# Patient Record
Sex: Male | Born: 1975 | Race: White | Hispanic: No | Marital: Single | Smoking: Former smoker
Health system: Southern US, Community
[De-identification: ages and names within clinical notes are randomized; demographics above are authoritative.]

## PROBLEM LIST (undated history)

## (undated) DIAGNOSIS — E119 Type 2 diabetes mellitus without complications: Secondary | ICD-10-CM

## (undated) DIAGNOSIS — K859 Acute pancreatitis without necrosis or infection, unspecified: Secondary | ICD-10-CM

## (undated) DIAGNOSIS — A523 Neurosyphilis, unspecified: Secondary | ICD-10-CM

## (undated) DIAGNOSIS — I2510 Atherosclerotic heart disease of native coronary artery without angina pectoris: Secondary | ICD-10-CM

## (undated) DIAGNOSIS — I219 Acute myocardial infarction, unspecified: Secondary | ICD-10-CM

## (undated) DIAGNOSIS — I10 Essential (primary) hypertension: Secondary | ICD-10-CM

## (undated) DIAGNOSIS — F32A Depression, unspecified: Secondary | ICD-10-CM

## (undated) DIAGNOSIS — Z9289 Personal history of other medical treatment: Secondary | ICD-10-CM

## (undated) DIAGNOSIS — Z720 Tobacco use: Secondary | ICD-10-CM

## (undated) DIAGNOSIS — E785 Hyperlipidemia, unspecified: Secondary | ICD-10-CM

## (undated) DIAGNOSIS — F419 Anxiety disorder, unspecified: Secondary | ICD-10-CM

## (undated) DIAGNOSIS — N529 Male erectile dysfunction, unspecified: Secondary | ICD-10-CM

## (undated) DIAGNOSIS — F329 Major depressive disorder, single episode, unspecified: Secondary | ICD-10-CM

## (undated) HISTORY — DX: Hyperlipidemia, unspecified: E78.5

## (undated) HISTORY — DX: Depression, unspecified: F32.A

## (undated) HISTORY — DX: Anxiety disorder, unspecified: F41.9

## (undated) HISTORY — DX: Male erectile dysfunction, unspecified: N52.9

## (undated) HISTORY — DX: Essential (primary) hypertension: I10

## (undated) HISTORY — DX: Major depressive disorder, single episode, unspecified: F32.9

## (undated) HISTORY — DX: Atherosclerotic heart disease of native coronary artery without angina pectoris: I25.10

---
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
## 1993-03-08 HISTORY — PX: PILONIDAL CYST EXCISION: SHX744

## 2014-04-19 ENCOUNTER — Inpatient Hospital Stay (HOSPITAL_COMMUNITY): Payer: 59

## 2014-04-19 ENCOUNTER — Inpatient Hospital Stay (HOSPITAL_COMMUNITY)
Admission: EM | Admit: 2014-04-19 | Discharge: 2014-04-21 | DRG: 057 | Disposition: A | Discharge status: Home Health | Payer: 59 | Attending: Internal Medicine | Admitting: Internal Medicine

## 2014-04-19 ENCOUNTER — Encounter (HOSPITAL_COMMUNITY): Payer: Self-pay | Admitting: *Deleted

## 2014-04-19 ENCOUNTER — Emergency Department (HOSPITAL_COMMUNITY): Payer: 59

## 2014-04-19 DIAGNOSIS — A523 Neurosyphilis, unspecified: Secondary | ICD-10-CM | POA: Diagnosis present

## 2014-04-19 DIAGNOSIS — A5149 Other secondary syphilitic conditions: Secondary | ICD-10-CM | POA: Diagnosis present

## 2014-04-19 DIAGNOSIS — F17210 Nicotine dependence, cigarettes, uncomplicated: Secondary | ICD-10-CM | POA: Diagnosis present

## 2014-04-19 DIAGNOSIS — F1290 Cannabis use, unspecified, uncomplicated: Secondary | ICD-10-CM | POA: Diagnosis present

## 2014-04-19 DIAGNOSIS — Z7251 High risk heterosexual behavior: Secondary | ICD-10-CM

## 2014-04-19 DIAGNOSIS — T508X5A Adverse effect of diagnostic agents, initial encounter: Secondary | ICD-10-CM | POA: Diagnosis present

## 2014-04-19 DIAGNOSIS — Z8619 Personal history of other infectious and parasitic diseases: Secondary | ICD-10-CM | POA: Insufficient documentation

## 2014-04-19 DIAGNOSIS — H9319 Tinnitus, unspecified ear: Secondary | ICD-10-CM | POA: Insufficient documentation

## 2014-04-19 DIAGNOSIS — Z716 Tobacco abuse counseling: Secondary | ICD-10-CM | POA: Diagnosis present

## 2014-04-19 DIAGNOSIS — H9313 Tinnitus, bilateral: Secondary | ICD-10-CM

## 2014-04-19 DIAGNOSIS — F17200 Nicotine dependence, unspecified, uncomplicated: Secondary | ICD-10-CM | POA: Insufficient documentation

## 2014-04-19 DIAGNOSIS — R21 Rash and other nonspecific skin eruption: Secondary | ICD-10-CM | POA: Diagnosis present

## 2014-04-19 HISTORY — DX: Neurosyphilis, unspecified: A52.3

## 2014-04-19 LAB — COMPREHENSIVE METABOLIC PANEL
ALK PHOS: 90 U/L (ref 39–117)
ALT: 28 U/L (ref 0–53)
AST: 32 U/L (ref 0–37)
Albumin: 3.5 g/dL (ref 3.5–5.2)
Anion gap: 10 (ref 5–15)
BUN: 8 mg/dL (ref 6–23)
CO2: 24 mmol/L (ref 19–32)
Calcium: 8.9 mg/dL (ref 8.4–10.5)
Chloride: 103 mmol/L (ref 96–112)
Creatinine, Ser: 0.62 mg/dL (ref 0.50–1.35)
GFR calc non Af Amer: >90 mL/min (ref >90)
Glucose, Bld: 112 mg/dL — ABNORMAL HIGH (ref 70–99)
POTASSIUM: 4 mmol/L (ref 3.5–5.1)
SODIUM: 137 mmol/L (ref 135–145)
Total Bilirubin: 0.6 mg/dL (ref 0.3–1.2)
Total Protein: 7.1 g/dL (ref 6.0–8.3)

## 2014-04-19 LAB — CBC WITH DIFFERENTIAL/PLATELET
BASOS PCT: 1 % (ref 0–1)
Basophils Absolute: 0.1 10*3/uL (ref 0.0–0.1)
EOS PCT: 1 % (ref 0–5)
Eosinophils Absolute: 0.1 10*3/uL (ref 0.0–0.7)
HCT: 42.9 % (ref 39.0–52.0)
Hemoglobin: 14.9 g/dL (ref 13.0–17.0)
Lymphocytes Relative: 22 % (ref 12–46)
Lymphs Abs: 2 10*3/uL (ref 0.7–4.0)
MCH: 31.3 pg (ref 26.0–34.0)
MCHC: 34.7 g/dL (ref 30.0–36.0)
MCV: 90.1 fL (ref 78.0–100.0)
MONO ABS: 0.9 10*3/uL (ref 0.1–1.0)
Monocytes Relative: 10 % (ref 3–12)
NEUTROS ABS: 6.1 10*3/uL (ref 1.7–7.7)
Neutrophils Relative %: 66 % (ref 43–77)
Platelets: 255 10*3/uL (ref 150–400)
RBC: 4.76 MIL/uL (ref 4.22–5.81)
RDW: 14.1 % (ref 11.5–15.5)
WBC: 9.1 10*3/uL (ref 4.0–10.5)

## 2014-04-19 LAB — RAPID HIV SCREEN (WH-MAU): SUDS RAPID HIV SCREEN: NONREACTIVE

## 2014-04-19 LAB — TSH: TSH: 1.548 u[IU]/mL (ref 0.350–4.500)

## 2014-04-19 MED ORDER — ACETAMINOPHEN 650 MG RE SUPP: 650.0000 mg | Freq: Four times a day (QID) | RECTAL | Status: DC | PRN

## 2014-04-19 MED ORDER — SODIUM CHLORIDE 0.9 % IJ SOLN: 3.0000 mL | Freq: Two times a day (BID) | INTRAMUSCULAR | Status: DC

## 2014-04-19 MED ORDER — SODIUM CHLORIDE 0.9 % IJ SOLN
10.0000 mL | Freq: Two times a day (BID) | INTRAMUSCULAR | Status: DC
  Administered 2014-04-19: 10 mL

## 2014-04-19 MED ORDER — SODIUM CHLORIDE 0.9 % IV SOLN
INTRAVENOUS | Status: DC
  Administered 2014-04-19 – 2014-04-20 (×3): via INTRAVENOUS

## 2014-04-19 MED ORDER — OXYCODONE HCL 5 MG PO TABS: 5.0000 mg | ORAL_TABLET | Freq: Four times a day (QID) | ORAL | Status: DC | PRN

## 2014-04-19 MED ORDER — GADOBENATE DIMEGLUMINE 529 MG/ML IV SOLN
15.0000 mL | Freq: Once | INTRAVENOUS | Status: AC | PRN
  Administered 2014-04-19: 15 mL via INTRAVENOUS

## 2014-04-19 MED ORDER — ACETAMINOPHEN 325 MG PO TABS: 650.0000 mg | ORAL_TABLET | Freq: Four times a day (QID) | ORAL | Status: DC | PRN

## 2014-04-19 MED ORDER — DOCUSATE SODIUM 100 MG PO CAPS
100.0000 mg | ORAL_CAPSULE | Freq: Two times a day (BID) | ORAL | Status: DC
  Filled 2014-04-19 (×4): qty 1

## 2014-04-19 MED ORDER — SODIUM CHLORIDE 0.9 % IJ SOLN: 10.0000 mL | INTRAMUSCULAR | Status: DC | PRN

## 2014-04-19 NOTE — H&P (Addendum)
[facility] History and Physical  [place] Current ONG:[id number] DOB: January 21, 1976 DOA: 04/19/2014  Referring physician:  PCP: No primary care provider on file.   Chief Complaint: Rash  HPI:  39 year old male sent to the ED for evaluation of tinnitus in his left ear for the past 2-3 weeks. Patient was evaluated at the health department and was found to have a diffuse maculopapular rash, he was sent to the ED for evaluation of neurosyphilis.Pt states he "usually gets STD testing every 3 months because I'm sexually active" and his most recent testing came back positive for syphilis. Patient has also noted multiple ulcerated macular lesions on his scrotum and has been an area in the last 2-3 weeks. He is unaware of or his partner is. He denies any IV drug use. He admits to using occasional marijuana, drinks alcohol 3-5 times a week. Denies any fever chills,rigors.No headache , no vertigo,  Recent HIV testing this month was negative.  Of note  is that the patient was into wrestling 20 years ago, and history of external auricular trauma, but denies any residual loss of hearing, or residual neurologic symptoms.  eview of Systems: negative for the following  Constitutional: Denies fever, chills, diaphoresis, appetite change and fatigue.  HEENT: Denies photophobia, eye pain, redness, hearing loss, ear pain, congestion, sore throat, rhinorrhea, sneezing, mouth sores, trouble swallowing, neck pain, neck stiffness and tinnitus.  Respiratory: Denies SOB, DOE, cough, chest tightness, and wheezing.  Cardiovascular: Denies chest pain, palpitations and leg swelling.  Gastrointestinal: Denies nausea, vomiting, abdominal pain, diarrhea, constipation, blood in stool and abdominal distention.  Genitourinary: Denies dysuria, urgency, frequency, hematuria, flank pain and difficulty urinating.  Musculoskeletal: Denies myalgias, back pain, joint swelling, arthralgias and gait problem.  Skin: Diffuse maculopapular  rash and wound.  Neurological: Denies dizziness, seizures, syncope, weakness, light-headedness, numbness and headaches.  Hematological: Denies adenopathy. Easy bruising, personal or family bleeding history  Psychiatric/Behavioral: Denies suicidal ideation, mood changes, confusion, nervousness, sleep disturbance and agitation       History reviewed. No pertinent past medical history.   Surgical history   history of pilonidal cyst removal    Social History:  reports that he has been smoking Cigarettes.  He does not have any smokeless tobacco history on file. He reports that he drinks alcohol.  Uses occasional marijuana. Multiple sexual partners    No Known Allergies      FAMILY HISTORY  When questioned  Directly-patient reports  No family history of HTN, CVA ,  TB, Cancer CAD, Bleeding Disorders, Sickle Cell, diabetes, anemia, asthma,  Father has diabetes, peripheral vascular disease   Prior to Admission medications   Not on File     Physical Exam: Filed Vitals:   04/19/14 1630 04/19/14 1700 04/19/14 1730 04/19/14 1800  BP: 135/82 138/93 139/88 137/88  Pulse: 93 93 85 99  Temp:      TempSrc:      Resp:      SpO2: 96% 97% 99% 96%     Constitutional: Vital s stable, Nontoxic appearing  and well-nourished in no acute distress and cooperative with exam. Alert and oriented x3.  Head: Normocephalic and atraumatic  Ear: TM normal bilaterally  Mouth: no erythema or exudates, MMM  Eyes: PERRL, EOMI, conjunctivae normal, No scleral icterus.  Neck: Supple, Trachea midline normal ROM, No JVD, mass, thyromegaly, or carotid bruit present.  Cardiovascular: RRR, S1 normal, S2 normal, no MRG, pulses symmetric and intact bilaterally  Pulmonary/Chest: CTAB, no wheezes, rales, or rhonchi  Abdominal:  Soft. Non-tender, non-distended, bowel sounds are normal, no masses, organomegaly, or guarding present.  GU: no CVA tenderness Musculoskeletal: No joint deformities, erythema, or  stiffness, ROM full and no nontender Ext: no edema and no cyanosis, pulses palpable bilaterally (DP and PT)  Hematology: no cervical, inginal, or axillary adenopathy.  Neurological: A&O x3, Strenght is normal and symmetric bilaterally, cranial nerve II-XII are grossly intact, no focal motor deficit, sensory intact to light touch bilaterally.  Skin:  diffuse maculopapular rash . No rash, cyanosis, or clubbing.  Psychiatric: Normal mood and affect. speech and behavior is normal. Judgment and thought content normal. Cognition and memory are normal.       Labs on Admission:    Basic Metabolic Panel:  Recent Labs Lab 04/19/14 1307  NA 137  K 4.0  CL 103  CO2 24  GLUCOSE 112*  BUN 8  CREATININE 0.62  CALCIUM 8.9   Liver Function Tests:  Recent Labs Lab 04/19/14 1307  AST 32  ALT 28  ALKPHOS 90  BILITOT 0.6  PROT 7.1  ALBUMIN 3.5   No results for input(s): LIPASE, AMYLASE in the last 168 hours. No results for input(s): AMMONIA in the last 168 hours. CBC:  Recent Labs Lab 04/19/14 1307  WBC 9.1  NEUTROABS 6.1  HGB 14.9  HCT 42.9  MCV 90.1  PLT 255   Cardiac Enzymes: No results for input(s): CKTOTAL, CKMB, CKMBINDEX, TROPONINI in the last 168 hours.  BNP (last 3 results) No results for input(s): BNP in the last 8760 hours.  ProBNP (last 3 results) No results for input(s): PROBNP in the last 8760 hours.     CBG: No results for input(s): GLUCAP in the last 168 hours.  Radiological Exams on Admission: Ct Head Wo Contrast  04/19/2014   CLINICAL DATA:  Tinnitus.  EXAM: CT HEAD WITHOUT CONTRAST  TECHNIQUE: Contiguous axial images were obtained from the base of the skull through the vertex without contrast.  COMPARISON:  None  FINDINGS: No evidence for acute hemorrhage, mass lesion, midline shift, hydrocephalus or large infarct. There may be a small amount of fluid in the left mastoid air cells. Mild mucosal disease in the ethmoid air cells. No acute bone  abnormality.  IMPRESSION: No acute intracranial abnormality.   Electronically Signed   By: Markus Daft M.D.   On: 04/19/2014 14:24    EKG: Independently reviewed.   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 Drucilla Schmidt recommended lumbar puncture Per Dr. Tommy Medal at [facility], pt needs LP with following: VDRL, cell count, diff, protein, glucose and FTA antibody Requested interventional radiology, EDP concerned about rash and introducing infection into the spine Patient had PICC line placement, pending antibiotic treatment According to Dr. Drucilla Schmidt, no antibiotics until a definitive diagnosis is made Therefore the patient has not been initiated on any antibiotics Infectious disease will consult Check for HIV CT of the head without contrast is negative,   Dr [place] Crane wanted patient to be bathed before LP, therefore it will be done tomorrow, he also recommends brain MRI , therefore will order tonight   Family history of diabetes Check hemoglobin A1c   Code Status:   full Family Communication: bedside Disposition Plan: admit   Time spent: 70 mins   [facility] Pager [phone]  If 7PM-7AM, please contact night-coverage [email or web address] Password [facility] 04/19/2014, 6:04 PM

## 2014-04-19 NOTE — ED Notes (Signed)
Report attempt x 1 

## 2014-04-19 NOTE — ED Notes (Signed)
See prior note, reports having syphillis and has rash to genitals but sent here for further eval due to ringing in his ears.

## 2014-04-19 NOTE — ED Provider Notes (Signed)
CSN: [id number]     Arrival date & time 04/19/14  1131 History   First MD Initiated Contact with Patient 04/19/14 1249     Chief Complaint  Patient presents with  . Tinnitus      HPI  Pt was seen at 1255.  Per pt, c/o gradual onset and persistence of constant "ringing" in his left ear for the past 2 to 3 weeks. Pt was evaluated by his MD at the [facility] and was told to come to the ED for further evaluation/admission for possible tertiary syphilis. Pt states he "usually gets STD testing every 3 months because I'm sexually active" and his most recent testing came back positive for syphilis. Denies dysuria/hematuria, no testicular pain/swelling, no abd pain, no N/V/D, no back pain, no CP/SOB, no fevers, no tingling/numbness in extremities, no focal motor weakness, no facial droop, no slurred speech.    History reviewed. No pertinent past medical history.   History reviewed. No pertinent past surgical history.  History  Substance Use Topics  . Smoking status: Current Every Day Smoker    Types: Cigarettes  . Smokeless tobacco: Not on file  . Alcohol Use: Yes    Review of Systems ROS: Statement: All systems negative except as marked or noted in the HPI; Constitutional: Negative for fever and chills. ; ; Eyes: Negative for eye pain, redness and discharge. ; ; ENMT: Negative for ear pain, hoarseness, nasal congestion, sinus pressure and sore throat. ; ; Cardiovascular: Negative for chest pain, palpitations, diaphoresis, dyspnea and peripheral edema. ; ; Respiratory: Negative for cough, wheezing and stridor. ; ; Gastrointestinal: Negative for nausea, vomiting, diarrhea, abdominal pain, blood in stool, hematemesis, jaundice and rectal bleeding. . ; ; Genitourinary: Negative for dysuria, flank pain and hematuria. ; ; Musculoskeletal: Negative for back pain and neck pain. Negative for swelling and trauma.; ; Skin: +diffuse rash. Negative for pruritus, abrasions, blisters, bruising and skin lesion.;  ; Neuro: +paresthesias. Negative for headache, lightheadedness and neck stiffness. Negative for weakness, altered level of consciousness , altered mental status, extremity weakness, involuntary movement, seizure and syncope.     Allergies  Review of patient's allergies indicates no known allergies.  Home Medications   Prior to Admission medications   Not on File   BP 122/78 mmHg  Pulse 96  Temp(Src) 98.8 F (37.1 C) (Oral)  Resp 18  SpO2 97% Physical Exam  1300; Physical examination:  Nursing notes reviewed; Vital signs and O2 SAT reviewed;  Constitutional: Well developed, Well nourished, Well hydrated, In no acute distress; Head:  Normocephalic, atraumatic; Eyes: EOMI, PERRL, No scleral icterus; ENMT: TM's clear bilat. Mouth and pharynx normal, Mucous membranes moist; Neck: Supple, Full range of motion, No lymphadenopathy; Cardiovascular: Regular rate and rhythm, No murmur, rub, or gallop; Respiratory: Breath sounds clear & equal bilaterally, No rales, rhonchi, wheezes.  Speaking full sentences with ease, Normal respiratory effort/excursion; Chest: Nontender, Movement normal; Abdomen: Soft, Nontender, Nondistended, Normal bowel sounds; Genitourinary: No CVA tenderness; Extremities: Pulses normal, No tenderness, No edema, No calf edema or asymmetry.; Neuro: AA&Ox3, Major CN grossly intact.  Speech clear. No gross focal motor or sensory deficits in extremities.; Skin: Color normal, Warm, Dry, +diffuse maculopapular rash, sparing palms/soles.    ED Course  Procedures      EKG Interpretation None      MDM  MDM Reviewed: nursing note and vitals Interpretation: labs and CT scan     Results for orders placed or performed during the hospital encounter of 04/19/14  CBC with Differential  Result Value Ref Range   WBC 9.1 4.0 - 10.5 K/uL   RBC 4.76 4.22 - 5.81 MIL/uL   Hemoglobin 14.9 13.0 - 17.0 g/dL   HCT 42.9 39.0 - 52.0 %   MCV 90.1 78.0 - 100.0 fL   MCH 31.3 26.0 - 34.0 pg    MCHC 34.7 30.0 - 36.0 g/dL   RDW 14.1 11.5 - 15.5 %   Platelets 255 150 - 400 K/uL   Neutrophils Relative % 66 43 - 77 %   Neutro Abs 6.1 1.7 - 7.7 K/uL   Lymphocytes Relative 22 12 - 46 %   Lymphs Abs 2.0 0.7 - 4.0 K/uL   Monocytes Relative 10 3 - 12 %   Monocytes Absolute 0.9 0.1 - 1.0 K/uL   Eosinophils Relative 1 0 - 5 %   Eosinophils Absolute 0.1 0.0 - 0.7 K/uL   Basophils Relative 1 0 - 1 %   Basophils Absolute 0.1 0.0 - 0.1 K/uL  Rapid HIV screen  Result Value Ref Range   SUDS Rapid HIV Screen NON REACTIVE NON REACTIVE  Comprehensive metabolic panel  Result Value Ref Range   Sodium 137 135 - 145 mmol/L   Potassium 4.0 3.5 - 5.1 mmol/L   Chloride 103 96 - 112 mmol/L   CO2 24 19 - 32 mmol/L   Glucose, Bld 112 (H) 70 - 99 mg/dL   BUN 8 6 - 23 mg/dL   Creatinine, Ser 0.62 0.50 - 1.35 mg/dL   Calcium 8.9 8.4 - 10.5 mg/dL   Total Protein 7.1 6.0 - 8.3 g/dL   Albumin 3.5 3.5 - 5.2 g/dL   AST 32 0 - 37 U/L   ALT 28 0 - 53 U/L   Alkaline Phosphatase 90 39 - 117 U/L   Total Bilirubin 0.6 0.3 - 1.2 mg/dL   GFR calc non Af Amer >90 >90 mL/min   GFR calc Af Amer >90 >90 mL/min   Anion gap 10 5 - 15   Ct Head Wo Contrast 04/19/2014   CLINICAL DATA:  Tinnitus.  EXAM: CT HEAD WITHOUT CONTRAST  TECHNIQUE: Contiguous axial images were obtained from the base of the skull through the vertex without contrast.  COMPARISON:  None  FINDINGS: No evidence for acute hemorrhage, mass lesion, midline shift, hydrocephalus or large infarct. There may be a small amount of fluid in the left mastoid air cells. Mild mucosal disease in the ethmoid air cells. No acute bone abnormality.  IMPRESSION: No acute intracranial abnormality.   Electronically Signed   By: Markus Daft M.D.   On: 04/19/2014 14:24    1515:  Workup reassuring. RPR pending. Pt has a diffuse rash which is concerning for secondary syphilis; will not pass though this potential treponemal containing rash for LP. Dx and testing d/w pt.   Questions answered.  Verb understanding, agreeable to admit. T/C to [facility] Dr. Drucilla Schmidt, case discussed, including:  HPI, pertinent PM/SHx, VS/PE, dx testing, ED course and treatment, as well as concern regarding possible secondary syphilis appearing rash that covers the site where LP would be performed, he states to hold LP and pt will have to be presumptively tx for tertiary syphilis with long tern IV abx through PICC line.   T/C to [facility] Dr. Allyson Sabal, case discussed, including:  HPI, pertinent PM/SHx, VS/PE, dx testing, ED course and treatment, as well as d/w ID MD: Agreeable to admit, requests to write temporary orders, obtain medical bed to team MCAdmits.  Francine Graven, DO 04/22/14 1223

## 2014-04-19 NOTE — ED Notes (Signed)
Pt was seen at [facility] and referred here to rule out neuro syphilis.  Per Dr. Tommy Medal at ID, pt needs LP with following: VDRL, cell count, diff, protein, glucose and FTA antibody.  If white count is elevated, pt is to be admitted for antibiotics.

## 2014-04-19 NOTE — Progress Notes (Signed)
Peripherally Inserted Central Catheter/Midline Placement  The IV Nurse has discussed with the patient and/or persons authorized to consent for the patient, the purpose of this procedure and the potential benefits and risks involved with this procedure.  The benefits include less needle sticks, lab draws from the catheter and patient may be discharged home with the catheter.  Risks include, but not limited to, infection, bleeding, blood clot (thrombus formation), and puncture of an artery; nerve damage and irregular heat beat.  Alternatives to this procedure were also discussed.  PICC/Midline Placement Documentation  PICC / Midline Single Lumen 16/60/63 PICC Right Basilic 38 cm 0 cm (Active)  Indication for Insertion or Continuance of Line Prolonged intravenous therapies 04/19/2014  5:30 PM  Exposed Catheter (cm) 0 cm 04/19/2014  5:30 PM  Site Assessment Clean;Dry;Intact 04/19/2014  5:30 PM  Line Status Flushed;Saline locked;Blood return noted 04/19/2014  5:30 PM  Dressing Type Transparent 04/19/2014  5:30 PM  Dressing Intervention New dressing 04/19/2014  5:30 PM  Dressing Change Due 04/26/14 04/19/2014  5:30 PM       [place] Payment 04/19/2014, 5:42 PM

## 2014-04-20 DIAGNOSIS — A5149 Other secondary syphilitic conditions: Secondary | ICD-10-CM

## 2014-04-20 DIAGNOSIS — A523 Neurosyphilis, unspecified: Principal | ICD-10-CM

## 2014-04-20 DIAGNOSIS — F17200 Nicotine dependence, unspecified, uncomplicated: Secondary | ICD-10-CM | POA: Insufficient documentation

## 2014-04-20 DIAGNOSIS — H9319 Tinnitus, unspecified ear: Secondary | ICD-10-CM | POA: Insufficient documentation

## 2014-04-20 DIAGNOSIS — H9313 Tinnitus, bilateral: Secondary | ICD-10-CM

## 2014-04-20 DIAGNOSIS — Z7251 High risk heterosexual behavior: Secondary | ICD-10-CM | POA: Insufficient documentation

## 2014-04-20 DIAGNOSIS — Z8619 Personal history of other infectious and parasitic diseases: Secondary | ICD-10-CM | POA: Insufficient documentation

## 2014-04-20 LAB — COMPREHENSIVE METABOLIC PANEL
ALK PHOS: 81 U/L (ref 39–117)
ALT: 25 U/L (ref 0–53)
AST: 28 U/L (ref 0–37)
Albumin: 3.1 g/dL — ABNORMAL LOW (ref 3.5–5.2)
Anion gap: 7 (ref 5–15)
BILIRUBIN TOTAL: 0.5 mg/dL (ref 0.3–1.2)
BUN: 6 mg/dL (ref 6–23)
CO2: 29 mmol/L (ref 19–32)
CREATININE: 0.6 mg/dL (ref 0.50–1.35)
Calcium: 8.5 mg/dL (ref 8.4–10.5)
Chloride: 102 mmol/L (ref 96–112)
GFR calc Af Amer: >90 mL/min (ref >90)
GFR calc non Af Amer: >90 mL/min (ref >90)
GLUCOSE: 101 mg/dL — AB (ref 70–99)
Potassium: 3.7 mmol/L (ref 3.5–5.1)
SODIUM: 138 mmol/L (ref 135–145)
Total Protein: 6.5 g/dL (ref 6.0–8.3)

## 2014-04-20 LAB — CBC
HCT: 41.5 % (ref 39.0–52.0)
Hemoglobin: 14.1 g/dL (ref 13.0–17.0)
MCH: 31.3 pg (ref 26.0–34.0)
MCHC: 34 g/dL (ref 30.0–36.0)
MCV: 92 fL (ref 78.0–100.0)
PLATELETS: 239 10*3/uL (ref 150–400)
RBC: 4.51 MIL/uL (ref 4.22–5.81)
RDW: 14.2 % (ref 11.5–15.5)
WBC: 6.9 10*3/uL (ref 4.0–10.5)

## 2014-04-20 LAB — URINALYSIS, ROUTINE W REFLEX MICROSCOPIC
Glucose, UA: NEGATIVE mg/dL
Hgb urine dipstick: NEGATIVE
Ketones, ur: NEGATIVE mg/dL
LEUKOCYTES UA: NEGATIVE
Nitrite: NEGATIVE
PROTEIN: NEGATIVE mg/dL
Specific Gravity, Urine: 1.037 — ABNORMAL HIGH (ref 1.005–1.030)
Urobilinogen, UA: 1 mg/dL (ref 0.0–1.0)
pH: 5.5 (ref 5.0–8.0)

## 2014-04-20 LAB — HEMOGLOBIN A1C
HEMOGLOBIN A1C: 5.4 % (ref 4.8–5.6)
MEAN PLASMA GLUCOSE: 108 mg/dL

## 2014-04-20 NOTE — Consult Note (Signed)
[facility] for Infectious [facility]    Date of Admission:  04/19/2014  Date of Consult:  04/20/2014  Reason for Consult: rule out Neurosyphilis Referring Physician: Dr. Lunette Stands   HPI: [name] [name] is an 39 y.o. male who has sex with other men and it was recently diagnosed with syphilis via the [place].  Patient reports ringing in his ears for several weeks. While this was going on he then developed a chancre on his penis and a diffuse rash consistent with secondary syphilis.  He was seen in the health department by Dr. Jeremy Johann who had a high degree of concern for possible neurosyphilis giving the persistent tinnitus.  She range for him to come to the emergency department for lumbar puncture.ER physicians were not comfortable doing lumbar puncture given the extent of his secondary syphilis. They're worried about injuries included became [place] through the lumbar puncture.  He is therefore admitted and plan for interventional radiology guided lumbar puncture today for cell count differential, CSF VDRL CSF protein and glucose and CSF fluorescent treponemal antibodies  He is at high risk for HIV infection though rapid test at [facility] and here were negative.  His most recent sexual intercourse was with another man who was reportedly HIV negative and who was taking Truvada for preexposure prophylaxis  The patient himself is interested in being on preexposure prophylaxis for HIV.   Past Medical History  Diagnosis Date  . Reaction to contrast media 04/19/2014    vomited with Gadolinum Multihance   . 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 Pilonidal cyst excision  1995  ergies:   No Known Allergies   Medications: I have reviewed patients current medications as documented in Epic Anti-infectives    None      Social History:  reports that he has been smoking Cigarettes.  He has a 23  pack-year smoking history. He has never used smokeless tobacco. He reports that he drinks about 14.4 oz of alcohol per week. He reports that he does not use illicit drugs.  History reviewed. No pertinent family history.  As in HPI and primary teams notes otherwise 12 point review of systems is negative  Blood pressure 140/83, pulse 91, temperature 98.7 F (37.1 C), temperature source Oral, resp. rate 18, SpO2 97 %. General: Alert and awake, oriented x3, not in any acute distress. HEENT: anicteric sclera, pupils reactive to light and accommodation, EOMI, oropharynx clear and without exudate CVS regular rate, normal r,  no murmur rubs or gallops Chest: clear to auscultation bilaterally, no wheezing, rales or rhonchi Abdomen: soft nontender, nondistended, normal bowel sounds, Extremities: no  clubbing or edema noted bilaterally Skin: diffuse macular rash prominent on the back  GU he has a chancre on shaft of penis just near the corona Neuro: nonfocal, strength and sensation intact   Results for orders placed or performed during the hospital encounter of 04/19/14 (from the past 48 hour(s))  CBC with Differential     Status: None   Collection Time: 04/19/14  1:07 PM  Result Value Ref Range   WBC 9.1 4.0 - 10.5 K/uL   RBC 4.76 4.22 - 5.81 MIL/uL   Hemoglobin 14.9 13.0 - 17.0 g/dL   HCT 42.9 39.0 - 52.0 %   MCV 90.1 78.0 - 100.0 fL   MCH 31.3 26.0 - 34.0 pg   MCHC 34.7 30.0 - 36.0 g/dL  RDW 14.1 11.5 - 15.5 %   Platelets 255 150 - 400 K/uL   Neutrophils Relative % 66 43 - 77 %   Neutro Abs 6.1 1.7 - 7.7 K/uL   Lymphocytes Relative 22 12 - 46 %   Lymphs Abs 2.0 0.7 - 4.0 K/uL   Monocytes Relative 10 3 - 12 %   Monocytes Absolute 0.9 0.1 - 1.0 K/uL   Eosinophils Relative 1 0 - 5 %   Eosinophils Absolute 0.1 0.0 - 0.7 K/uL   Basophils Relative 1 0 - 1 %   Basophils Absolute 0.1 0.0 - 0.1 K/uL  Rapid HIV screen     Status: None   Collection Time: 04/19/14  1:07 PM  Result Value Ref  Range   SUDS Rapid HIV Screen NON REACTIVE NON REACTIVE  Comprehensive metabolic panel     Status: Abnormal   Collection Time: 04/19/14  1:07 PM  Result Value Ref Range   Sodium 137 135 - 145 mmol/L   Potassium 4.0 3.5 - 5.1 mmol/L   Chloride 103 96 - 112 mmol/L   CO2 24 19 - 32 mmol/L   Glucose, Bld 112 (H) 70 - 99 mg/dL   BUN 8 6 - 23 mg/dL   Creatinine, Ser 0.62 0.50 - 1.35 mg/dL   Calcium 8.9 8.4 - 10.5 mg/dL   Total Protein 7.1 6.0 - 8.3 g/dL   Albumin 3.5 3.5 - 5.2 g/dL   AST 32 0 - 37 U/L   ALT 28 0 - 53 U/L   Alkaline Phosphatase 90 39 - 117 U/L   Total Bilirubin 0.6 0.3 - 1.2 mg/dL   GFR calc non Af Amer >90 >90 mL/min   GFR calc Af Amer >90 >90 mL/min    Comment: (NOTE) The eGFR has been calculated using the CKD EPI equation. This calculation has not been validated in all clinical situations. eGFR's persistently <90 mL/min signify possible Chronic Kidney Disease.    Anion gap 10 5 - 15  TSH     Status: None   Collection Time: 04/19/14  4:23 PM  Result Value Ref Range   TSH 1.548 0.350 - 4.500 uIU/mL  Hemoglobin A1c     Status: None   Collection Time: 04/19/14  4:23 PM  Result Value Ref Range   Hgb A1c MFr Bld 5.4 4.8 - 5.6 %    Comment: (NOTE)         Pre-diabetes: 5.7 - 6.4         Diabetes: >6.4         Glycemic control for adults with diabetes: <7.0    Mean Plasma Glucose 108 mg/dL    Comment: (NOTE) Performed At: [facility] [place], [place] [id number] Lindon Romp MD FW:[id number]   Comprehensive metabolic panel     Status: Abnormal   Collection Time: 04/20/14  4:39 AM  Result Value Ref Range   Sodium 138 135 - 145 mmol/L   Potassium 3.7 3.5 - 5.1 mmol/L   Chloride 102 96 - 112 mmol/L   CO2 29 19 - 32 mmol/L   Glucose, Bld 101 (H) 70 - 99 mg/dL   BUN 6 6 - 23 mg/dL   Creatinine, Ser 0.60 0.50 - 1.35 mg/dL   Calcium 8.5 8.4 - 10.5 mg/dL   Total Protein 6.5 6.0 - 8.3 g/dL   Albumin 3.1 (L) 3.5 - 5.2 g/dL   AST 28 0 -  37 U/L   ALT 25 0 - 53  U/L   Alkaline Phosphatase 81 39 - 117 U/L   Total Bilirubin 0.5 0.3 - 1.2 mg/dL   GFR calc non Af Amer >90 >90 mL/min   GFR calc Af Amer >90 >90 mL/min    Comment: (NOTE) The eGFR has been calculated using the CKD EPI equation. This calculation has not been validated in all clinical situations. eGFR's persistently <90 mL/min signify possible Chronic Kidney Disease.    Anion gap 7 5 - 15  CBC     Status: None   Collection Time: 04/20/14  4:39 AM  Result Value Ref Range   WBC 6.9 4.0 - 10.5 K/uL   RBC 4.51 4.22 - 5.81 MIL/uL   Hemoglobin 14.1 13.0 - 17.0 g/dL   HCT 41.5 39.0 - 52.0 %   MCV 92.0 78.0 - 100.0 fL   MCH 31.3 26.0 - 34.0 pg   MCHC 34.0 30.0 - 36.0 g/dL   RDW 14.2 11.5 - 15.5 %   Platelets 239 150 - 400 K/uL  Urinalysis, Routine w reflex microscopic     Status: Abnormal   Collection Time: 04/20/14  6:05 AM  Result Value Ref Range   Color, Urine AMBER (A) YELLOW    Comment: BIOCHEMICALS MAY BE AFFECTED BY COLOR   APPearance CLEAR CLEAR   Specific Gravity, Urine 1.037 (H) 1.005 - 1.030   pH 5.5 5.0 - 8.0   Glucose, UA NEGATIVE NEGATIVE mg/dL   Hgb urine dipstick NEGATIVE NEGATIVE   Bilirubin Urine SMALL (A) NEGATIVE   Ketones, ur NEGATIVE NEGATIVE mg/dL   Protein, ur NEGATIVE NEGATIVE mg/dL   Urobilinogen, UA 1.0 0.0 - 1.0 mg/dL   Nitrite NEGATIVE NEGATIVE   Leukocytes, UA NEGATIVE NEGATIVE    Comment: MICROSCOPIC NOT DONE ON URINES WITH NEGATIVE PROTEIN, BLOOD, LEUKOCYTES, NITRITE, OR GLUCOSE <1000 mg/dL.   _0 (sdes,specrequest,cult,reptstatus)   )No results found for this or any previous visit (from the past 720 hour(s)).   Impression/Recommendation  Active Problems:   Neurosyphilis   Leonell Lobdell is a 39 y.o. male with secondary syphilis RPR of 132 and tinnitus concerning for neurosyphilis  #1 Rule out neurosyphilis:  Agree with fluoroscopy guided lumbar puncture for CSF VDRL CSF cell count and differential CSF  protein and glucose and CSF FTA-Abs  The patient himself is anxious to get back to work as soon as possible.  LS the cell count is obviously high on lumbar puncture today we may not yet have all the information we need to make a diagnosis of neurosyphilis yet.  I will see what labs we have back in the morning and make a plan from there.  If we do go forward with treating him for neurosyphilis and he has a PICC line in place already the patient would prefer to be on once daily ceftriaxone at 2 g in sedative continuous penicillin  #2 high risk management having sex with other men:  I would like to prescribe the patient Truvada and he can even leave the hospital with a prescription, though I worry will have a high co-pay without the [place] co-pay assistance card which we have in our clinic.  Prior to starting Truvada and I would like to get another HIV test namely an HIV RNA and an HIV fourth generation assay as well as testing for hepatitis B and a  I spent greater than 60 minutes with the patient including greater than 50% of time in face to face counsel of the patient and in coordination of their care.  04/20/2014, 3:14 PM   Thank you so much for this interesting consult  [place] [place] [phone] (pager) [phone] (office) 04/20/2014, 3:14 PM  [place] 04/20/2014, 3:14 PM

## 2014-04-20 NOTE — Progress Notes (Signed)
[facility] PROGRESS NOTE  [name] [name] ELF:[id number] DOB: June 05, 1975 DOA: 04/19/2014 PCP: Wyatt Haste, MD 39 year old male sent to the ED for evaluation of tinnitus in his left ear for the past 2-3 weeks. Patient was evaluated at the health department and was found to have a diffuse maculopapular rash and r/o neurosyphilis.Pt states he "usually gets STD testing every 3 months  and his most recent testing came back positive for syphilis. Patient has also noted multiple ulcerated macular lesions on his scrotum and has been an area in the last 2-3 weeks. He is unaware of or his partner is. He denies any IV drug use. He admits to using occasional marijuana, drinks alcohol 3-5 times a week. Denies any fever chills,rigors.No headache , no vertigo, Recent HIV testing this month was negative.Homosexual.  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 Drucilla Schmidt ID recommended lumbar puncture by IR F/u CSF VDRL, cell count, diff, protein, glucose and FTA antibody EDP concerned about rash and introducing infection into the spine Patient had PICC line placement, ID rec IV rocephin 2 gr daily after LP HIV- Neg screening. ID rec truvada for px as pt is at high risk. CT head without contrast and MRI neg.   Tobacco use  councelled  Family history of diabetes  hemoglobin A1c- 5.6  Code Status: Full Family Communication:  Patient. Disposition Plan: home   Consultants:  ID  Procedures:  LP  Ct head  MRI brain    Objective: Filed Vitals:   04/20/14 0607  BP: 140/83  Pulse: 91  Temp: 98.7 F (37.1 C)  Resp: 18    Intake/Output Summary (Last 24 hours) at 04/20/14 1643 Last data filed at 04/20/14 1419  Gross per 24 hour  Intake   1350 ml  Output    325 ml  Net   1025 ml   There were no vitals filed for this visit.  Exam:   General:  No distress  Cardiovascular: S1, S2   Respiratory: B/l clear  Abdomen: Soft NT, ND, no  hepatospleenomegaly.  Musculoskeletal: Good ROM  Skin:  maculopapular rash diffuse   Data Reviewed: Basic Metabolic Panel:  Recent Labs Lab 04/19/14 1307 04/20/14 0439  NA 137 138  K 4.0 3.7  CL 103 102  CO2 24 29  GLUCOSE 112* 101*  BUN 8 6  CREATININE 0.62 0.60  CALCIUM 8.9 8.5   Liver Function Tests:  Recent Labs Lab 04/19/14 1307 04/20/14 0439  AST 32 28  ALT 28 25  ALKPHOS 90 81  BILITOT 0.6 0.5  PROT 7.1 6.5  ALBUMIN 3.5 3.1*   No results for input(s): LIPASE, AMYLASE in the last 168 hours. No results for input(s): AMMONIA in the last 168 hours. CBC:  Recent Labs Lab 04/19/14 1307 04/20/14 0439  WBC 9.1 6.9  NEUTROABS 6.1  --   HGB 14.9 14.1  HCT 42.9 41.5  MCV 90.1 92.0  PLT 255 239   Cardiac Enzymes: No results for input(s): CKTOTAL, CKMB, CKMBINDEX, TROPONINI in the last 168 hours. BNP (last 3 results) No results for input(s): BNP in the last 8760 hours.  ProBNP (last 3 results) No results for input(s): PROBNP in the last 8760 hours.  CBG: No results for input(s): GLUCAP in the last 168 hours.  No results found for this or any previous visit (from the past 240 hour(s)).   Studies: Ct Head Wo Contrast  04/19/2014   CLINICAL DATA:  Tinnitus.  EXAM: CT HEAD WITHOUT CONTRAST  TECHNIQUE:  Contiguous axial images were obtained from the base of the skull through the vertex without contrast.  COMPARISON:  None  FINDINGS: No evidence for acute hemorrhage, mass lesion, midline shift, hydrocephalus or large infarct. There may be a small amount of fluid in the left mastoid air cells. Mild mucosal disease in the ethmoid air cells. No acute bone abnormality.  IMPRESSION: No acute intracranial abnormality.   Electronically Signed   By: Markus Daft M.D.   On: 04/19/2014 14:24   Mr Jeri Cos LK Contrast  04/19/2014   CLINICAL DATA:  Tinnitus LEFT ear for 2-3 weeks, history of syphilis and genital rash, assess for neurosyphilis.  EXAM: MRI HEAD WITHOUT AND WITH  CONTRAST  TECHNIQUE: Multiplanar, multiecho pulse sequences of the brain and surrounding structures were obtained without and with intravenous contrast.  CONTRAST:  44m MULTIHANCE GADOBENATE DIMEGLUMINE 529 MG/ML IV SOLN  COMPARISON:  CT of the head April 19, 2014 at 1339 hours  FINDINGS: The ventricles and sulci are normal for patient's age. No abnormal parenchymal signal, mass lesions, mass effect. No abnormal parenchymal enhancement. No reduced diffusion to suggest acute ischemia. No susceptibility artifact to suggest hemorrhage.  No abnormal extra-axial fluid collections. No extra-axial masses nor leptomeningeal enhancement. Normal major intracranial vascular flow voids seen at the skull base.  Ocular globes and orbital contents are unremarkable though not tailored for evaluation. No abnormal sellar expansion. The paranasal sinuses demonstrate mild mucosal thickening without air-fluid levels. Trace LEFT mastoid effusion. No suspicious calvarial bone marrow signal. No abnormal sellar expansion. Craniocervical junction maintained.  IMPRESSION: Normal MRI of the brain with and without contrast.  Trace LEFT mastoid effusion.   Electronically Signed   By: CElon Alas  On: 04/19/2014 22:27    Scheduled Meds: . docusate sodium  100 mg Oral BID  . sodium chloride  10-40 mL Intracatheter Q12H  . sodium chloride  3 mL Intravenous Q12H   Continuous Infusions: . sodium chloride 75 mL/hr at 04/20/14 09179   Active Problems:   Neurosyphilis   Tinnitus   Secondary syphilis   High risk sexual behavior    Time spent: 329m    [name] [name]  [facility] Pager [phone] 7PM-7AM, please contact night-coverage at [email or web address], password [email or web address]/13/2016, 4:43 PM  LOS: 1 day

## 2014-04-21 DIAGNOSIS — Z720 Tobacco use: Secondary | ICD-10-CM

## 2014-04-21 LAB — PROTIME-INR
INR: 1.06 (ref 0.00–1.49)
Prothrombin Time: 13.9 seconds (ref 11.6–15.2)

## 2014-04-21 LAB — HEPATITIS A ANTIBODY, TOTAL: Hep A Total Ab: REACTIVE — AB

## 2014-04-21 LAB — HEPATITIS B SURFACE ANTIGEN: Hepatitis B Surface Ag: NEGATIVE

## 2014-04-21 LAB — HEPATITIS B SURFACE ANTIBODY, QUANTITATIVE: Hepatitis B-Post: 32.5 m[IU]/mL (ref >9.9)

## 2014-04-21 MED ORDER — CEFTRIAXONE SODIUM IN DEXTROSE 40 MG/ML IV SOLN
2.0000 g | Freq: Once | INTRAVENOUS | Status: AC
Start: 2014-04-21 — End: 2014-04-21
  Administered 2014-04-21: 2 g via INTRAVENOUS
  Filled 2014-04-21: qty 50

## 2014-04-21 MED ORDER — HEPARIN SOD (PORK) LOCK FLUSH 100 UNIT/ML IV SOLN
250.0000 [IU] | INTRAVENOUS | Status: AC | PRN
Start: 2014-04-21 — End: 2014-04-21
  Administered 2014-04-21: 250 [IU]

## 2014-04-21 MED ORDER — EMTRICITABINE-TENOFOVIR DF 200-300 MG PO TABS
1.0000 | ORAL_TABLET | Freq: Every day | ORAL | Status: DC
  Administered 2014-04-21: 1 via ORAL
  Filled 2014-04-21 (×3): qty 1

## 2014-04-21 MED ORDER — CEFTRIAXONE SODIUM 1 G IJ SOLR: 2.0000 g | INTRAMUSCULAR | Status: DC

## 2014-04-21 MED ORDER — EMTRICITABINE-TENOFOVIR DF 200-300 MG PO TABS: 1.0000 | ORAL_TABLET | Freq: Every day | ORAL | Status: DC

## 2014-04-21 NOTE — Progress Notes (Signed)
Patient had two separate episodes of vtach.  Had 8 beat run and 11 beat run within minutes of each episode.  Asymptomatic.  Notified Lynch PA.  No new orders received.  Will continue to monitor.

## 2014-04-21 NOTE — Care Management Note (Signed)
Page 1 of 2   04/21/2014     5:03:26 PM CARE MANAGEMENT NOTE 04/21/2014  Patient:  [name] [name]   Account Number:  [id number]  Date Initiated:  04/21/2014  Documentation initiated by:  JEFFRIES,SARAH  Subjective/Objective Assessment:   adm: neurosyph     Action/Plan:   discharge planning   Anticipated DC Date:  04/21/2014   Anticipated DC Plan:  HOME W HOME HEALTH SERVICES      DC Planning Services  CM consult      PAC Choice  HOME HEALTH   Choice offered to / List presented to:  C-1 Patient   DME arranged  IV PUMP/EQUIPMENT      DME agency  [facility]     HH arranged  HH-1 RN  IV Antibiotics      HH agency  [facility]   Status of service:  Completed, signed off Medicare Important Message given?   (If response is "NO", the following Medicare IM given date fields will be blank) Date Medicare IM given:   Medicare IM given by:   Date Additional Medicare IM given:   Additional Medicare IM given by:    Discharge Disposition:  HOME W HOME HEALTH SERVICES  Per UR Regulation:    If discussed at Long Length of Stay Meetings, dates discussed:    Comments:  04/21/14 16:55 Cm received call from MD stating pt to be discharged with IV ABX. CM met with pt in room to offer choice of home health agency.  Pt chooses [facility] to render HHRN for IV ABX. Address and contact information verified by pt.  Referral called to [facility] rep, Katie.  Prescription faxed to [facility] with SOC to be 15:00 04/22/14.  No other CM needs were communicated. Sarah Jeffries, BSN, CM [phone].   

## 2014-04-21 NOTE — Progress Notes (Signed)
    [facility] for Infectious Disease    I have received call from  Primary team and Neurology  I had also discussed this case with Jeremy Johann MD from [facility].  Dr. Santo Held had felt it should be safe to proceed with LP even it it meant going through lesion of secondary syphilis.  I would feel comfortable if someone did LP and went through syphilitic lesion provided the person doing the procedure washed back off extensively with hibiclens antiseptic --which they would need to do regardlessly to try to prevent introduction of nosocomial infection.

## 2014-04-21 NOTE — Discharge Summary (Signed)
Physician Discharge Summary  E[place] On EZM:[id number] DOB: 05-Nov-1975 DOA: 04/19/2014  PCP: Wyatt Haste, MD  Admit date: 04/19/2014 Discharge date: 04/21/2014  Time spent: 35 minutes  Recommendations for Outpatient Follow-up:  1. Come to short stay for Iv Rocephin on 04/22/2014  2.  F/u with PCP and Dr. Tommy Medal.  Discharge Diagnoses:  Active Problems:   Neurosyphilis   Tinnitus   Secondary syphilis   High risk sexual behavior   Tobacco use disorder   Discharge Condition: Stable  Diet recommendation: Regular  Filed Weights   04/20/14 2107  Weight: 69.446 kg (153 lb 1.6 oz)    History of present illness:  39 year old male sent to the ED for evaluation of tinnitus in his left ear for the past 2-3 weeks. Patient was evaluated at the health department and was found to have a diffuse maculopapular rash and r/o neurosyphilis.Pt states he "usually gets STD testing every 3 months and his most recent testing came back positive for syphilis. Patient has also noted multiple ulcerated macular lesions on his scrotum and has been an area in the last 2-3 weeks. He is unaware of or his partner is. He denies any IV drug use. He admits to using occasional marijuana, drinks alcohol 3-5 times a week. Denies any fever chills,rigors.No headache , no vertigo, Recent HIV testing this month was negative.Homosexual.  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 Drucilla Schmidt ID recommended lumbar puncture. Patient refused when neurohospitalist was ready to do. Started empirically on Rocephin 2gr IV and ID recommended for 2 weeks of Rocephin 2 gr IV. PICC is placed. homehealth is arranged. Due to inclement weather pt will need to come to short stay for 1 dose as outpatient. HIV- Neg screening. ID rec truvada for px as pt is at high risk sexual behavior.CT head without contrast and MRI neg.   Tobacco use councelled  Family history of diabetes hemoglobin A1c-  5.6  Consultations:  ID  Discharge Exam: Filed Vitals:   04/21/14 0517  BP: 146/86  Pulse: 95  Temp: 99.2 F (37.3 C)  Resp: 18    Discharge Instructions    Current Discharge Medication List    START taking these medications   Details  cefTRIAXone (ROCEPHIN) 1 G injection Inject 2 g into the muscle daily. Qty: 1 each, Refills: 0    emtricitabine-tenofovir (TRUVADA) 200-300 MG per tablet Take 1 tablet by mouth daily. Qty: 30 tablet, Refills: 2       No Known Allergies Follow-up Information    Schedule an appointment as soon as possible for a visit in 1 week to follow up.      Follow up with Wyatt Haste, MD.   Specialty:  Family Medicine   Contact information:   [address] [place] [address] [phone]       Follow up with Alcide Evener, MD. Schedule an appointment as soon as possible for a visit in 1 week.   Specialty:  Infectious Diseases   Contact information:   [address] [place] [address] [phone]        The results of significant diagnostics from this hospitalization (including imaging, microbiology, ancillary and laboratory) are listed below for reference.    Significant Diagnostic Studies: Ct Head Wo Contrast  04/19/2014   CLINICAL DATA:  Tinnitus.  EXAM: CT HEAD WITHOUT CONTRAST  TECHNIQUE: Contiguous axial images were obtained from the base of the skull through the vertex without contrast.  COMPARISON:  None  FINDINGS: No evidence for acute hemorrhage, mass lesion, midline shift, hydrocephalus or large infarct. There may be a small amount of fluid in the left mastoid air cells. Mild mucosal disease in the ethmoid air cells. No acute bone abnormality.  IMPRESSION: No acute intracranial abnormality.   Electronically Signed   By: Markus Daft M.D.   On: 04/19/2014 14:24   Mr Jeri Cos YO Contrast  04/19/2014   CLINICAL DATA:  Tinnitus LEFT ear for 2-3 weeks, history of syphilis and  genital rash, assess for neurosyphilis.  EXAM: MRI HEAD WITHOUT AND WITH CONTRAST  TECHNIQUE: Multiplanar, multiecho pulse sequences of the brain and surrounding structures were obtained without and with intravenous contrast.  CONTRAST:  68m MULTIHANCE GADOBENATE DIMEGLUMINE 529 MG/ML IV SOLN  COMPARISON:  CT of the head April 19, 2014 at 1339 hours  FINDINGS: The ventricles and sulci are normal for patient's age. No abnormal parenchymal signal, mass lesions, mass effect. No abnormal parenchymal enhancement. No reduced diffusion to suggest acute ischemia. No susceptibility artifact to suggest hemorrhage.  No abnormal extra-axial fluid collections. No extra-axial masses nor leptomeningeal enhancement. Normal major intracranial vascular flow voids seen at the skull base.  Ocular globes and orbital contents are unremarkable though not tailored for evaluation. No abnormal sellar expansion. The paranasal sinuses demonstrate mild mucosal thickening without air-fluid levels. Trace LEFT mastoid effusion. No suspicious calvarial bone marrow signal. No abnormal sellar expansion. Craniocervical junction maintained.  IMPRESSION: Normal MRI of the brain with and without contrast.  Trace LEFT mastoid effusion.   Electronically Signed   By: CElon Alas  On: 04/19/2014 22:27    Microbiology: No results found for this or any previous visit (from the past 240 hour(s)).   Labs: Basic Metabolic Panel:  Recent Labs Lab 04/19/14 1307 04/20/14 0439  NA 137 138  K 4.0 3.7  CL 103 102  CO2 24 29  GLUCOSE 112* 101*  BUN 8 6  CREATININE 0.62 0.60  CALCIUM 8.9 8.5   Liver Function Tests:  Recent Labs Lab 04/19/14 1307 04/20/14 0439  AST 32 28  ALT 28 25  ALKPHOS 90 81  BILITOT 0.6 0.5  PROT 7.1 6.5  ALBUMIN 3.5 3.1*   No results for input(s): LIPASE, AMYLASE in the last 168 hours. No results for input(s): AMMONIA in the last 168 hours. CBC:  Recent Labs Lab 04/19/14 1307 04/20/14 0439  WBC  9.1 6.9  NEUTROABS 6.1  --   HGB 14.9 14.1  HCT 42.9 41.5  MCV 90.1 92.0  PLT 255 239   Cardiac Enzymes: No results for input(s): CKTOTAL, CKMB, CKMBINDEX, TROPONINI in the last 168 hours. BNP: BNP (last 3 results) No results for input(s): BNP in the last 8760 hours.  ProBNP (last 3 results) No results for input(s): PROBNP in the last 8760 hours.  CBG: No results for input(s): GLUCAP in the last 168 hours.     Signed:  VVZCHYIFOY,DXAJOIN [facility] 04/21/2014, 4:12 PM

## 2014-04-21 NOTE — Progress Notes (Signed)
Pt discharged home per MD. PICC line capped and flushed by IV team. [facility] f[facility]u with Rocephin IV. Prescriptions given to pt. Pt ambulated without difficulty requiring no escort at discharge. Manya Silvas, RN

## 2014-04-21 NOTE — Progress Notes (Signed)
  Outpatient Parenteral Antimicrobial Therapy (OPAT) Pharmacy Monitoring  Guidelines developed by the Infectious Diseases Society of [place] (IDSA) recommend that hospitals who send patients home on intravenous (IV) antibiotics have an active performance improvement program.  IDSA guidelines also recommend that an infection specialist, such as a pharmacist, be involved in the evaluation of candidates for discharge with OPAT.      Yes No Comments  Assessed patient for appropriateness of OPAT? [x]  []  Is IV antimicrobial therapy needed? Is the home environment safe and adequate to support care? Are the patient/caregivers willing to participate and able to safely, effectively, and reliably deliver parenteral antimicrobial therapy? Is communication about problems and monitoring of therapy in place with patient? Does the patient/caregiver understand the benefits/risks with OPAT?  Are oral antibiotics an option? []  [x]  Patients diagnosed with endocarditis are not candidates for oral antibiotics.  Is antibiotic selected the best option for treatment indication? [x]  []  Can therapy be de-escalated further?  Is the dose appropriate? [x]  []  Consider renal function and administration schedule for outpatient setting.  Is the length of therapy appropriate? [x]  []  Consider disease-specific guidelines when available.  Recommend changing antibiotic regimen? []  [x]  If yes, recommend _________.    Final antibiotic regimen for discharge is ceftriaxone 2 gms IV q24h for 2 weeks (include drug, dose, route, frequency and duration).  Laboratory monitoring and follow-up for discharge with OPAT: BMET/CBC weekly, return to [facility] in 2-3 weeks.   Thank you for allowing pharmacy to be a part of this patient's care team.  Cassie L. Nicole Kindred, PharmD Clinical Pharmacy Resident Pager: [phone] 04/21/2014 5:14 PM

## 2014-04-21 NOTE — Progress Notes (Signed)
UR Completed.  [phone]  

## 2014-04-21 NOTE — Progress Notes (Addendum)
[facility] Infectious [facility]    Subjective:   He was understandably unhappy about all of the back and forth and delay in LP, he decided to just go with 2 weeks of IV rocephin to rx for Neurosyphilis   Antibiotics:  Anti-infectives    Start     Dose/Rate Route Frequency Ordered Stop   04/21/14 1300  emtricitabine-tenofovir (TRUVADA) 200-300 MG per tablet 1 tablet     1 tablet Oral Daily 04/21/14 1235     04/21/14 1045  cefTRIAXone (ROCEPHIN) 2 g in dextrose 5 % 50 mL IVPB - Premix     2 g 100 mL/hr over 30 Minutes Intravenous  Once 04/21/14 1044 04/21/14 1519   04/21/14 0000  emtricitabine-tenofovir (TRUVADA) 200-300 MG per tablet     1 tablet Oral Daily 04/21/14 1602     04/21/14 0000  cefTRIAXone (ROCEPHIN) 1 G injection     2 g Intramuscular Every 24 hours 04/21/14 1602 05/05/14 2359      Medications: Scheduled Meds: . docusate sodium  100 mg Oral BID  . emtricitabine-tenofovir  1 tablet Oral Daily  . sodium chloride  10-40 mL Intracatheter Q12H  . sodium chloride  3 mL Intravenous Q12H   Continuous Infusions: . sodium chloride 75 mL/hr at 04/20/14 2232   PRN Meds:.acetaminophen **OR** acetaminophen, oxyCODONE, sodium chloride    Objective: Weight change:   Intake/Output Summary (Last 24 hours) at 04/21/14 1706 Last data filed at 04/21/14 1449  Gross per 24 hour  Intake    650 ml  Output    400 ml  Net    250 ml   Blood pressure 146/86, pulse 95, temperature 99.2 F (37.3 C), temperature source Oral, resp. rate 18, weight 153 lb 1.6 oz (69.446 kg), SpO2 98 %. Temp:  [98.6 F (37 C)-99.2 F (37.3 C)] 99.2 F (37.3 C) (02/14 0517) Pulse Rate:  [87-95] 95 (02/14 0517) Resp:  [17-18] 18 (02/14 0517) BP: (121-146)/(76-86) 146/86 mmHg (02/14 0517) SpO2:  [96 %-98 %] 98 % (02/14 0517) Weight:  [153 lb 1.6 oz (69.446 kg)] 153 lb 1.6 oz (69.446 kg) (02/13 2107)  Physical Exam: General: Alert and awake, oriented x3, not in any acute distress. HEENT:  anicteric sclera, pupils reactive to light and accommodation, EOMI, oropharynx clear and without exudate CVS regular rate, normal r, no murmur rubs or gallops Chest: clear to auscultation bilaterally, no wheezing, rales or rhonchi Abdomen: soft nontender, nondistended, normal bowel sounds, Extremities: no clubbing or edema noted bilaterally Skin: diffuse macular rash prominent on the back  CBC:  CBC Latest Ref Rng 04/20/2014 04/19/2014  WBC 4.0 - 10.5 K/uL 6.9 9.1  Hemoglobin 13.0 - 17.0 g/dL 14.1 14.9  Hematocrit 39.0 - 52.0 % 41.5 42.9  Platelets 150 - 400 K/uL 239 255       BMET  Recent Labs  04/19/14 1307 04/20/14 0439  NA 137 138  K 4.0 3.7  CL 103 102  CO2 24 29  GLUCOSE 112* 101*  BUN 8 6  CREATININE 0.62 0.60  CALCIUM 8.9 8.5     Liver Panel   Recent Labs  04/19/14 1307 04/20/14 0439  PROT 7.1 6.5  ALBUMIN 3.5 3.1*  AST 32 28  ALT 28 25  ALKPHOS 90 81  BILITOT 0.6 0.5       Sedimentation Rate No results for input(s): ESRSEDRATE in the last 72 hours. C-Reactive Protein No results for input(s): CRP in the last 72 hours.  Micro Results: No  results found for this or any previous visit (from the past 720 hour(s)).  Studies/Results: Mr Jeri Cos Wo Contrast  04/19/2014   CLINICAL DATA:  Tinnitus LEFT ear for 2-3 weeks, history of syphilis and genital rash, assess for neurosyphilis.  EXAM: MRI HEAD WITHOUT AND WITH CONTRAST  TECHNIQUE: Multiplanar, multiecho pulse sequences of the brain and surrounding structures were obtained without and with intravenous contrast.  CONTRAST:  40m MULTIHANCE GADOBENATE DIMEGLUMINE 529 MG/ML IV SOLN  COMPARISON:  CT of the head April 19, 2014 at 1339 hours  FINDINGS: The ventricles and sulci are normal for patient's age. No abnormal parenchymal signal, mass lesions, mass effect. No abnormal parenchymal enhancement. No reduced diffusion to suggest acute ischemia. No susceptibility artifact to suggest hemorrhage.  No  abnormal extra-axial fluid collections. No extra-axial masses nor leptomeningeal enhancement. Normal major intracranial vascular flow voids seen at the skull base.  Ocular globes and orbital contents are unremarkable though not tailored for evaluation. No abnormal sellar expansion. The paranasal sinuses demonstrate mild mucosal thickening without air-fluid levels. Trace LEFT mastoid effusion. No suspicious calvarial bone marrow signal. No abnormal sellar expansion. Craniocervical junction maintained.  IMPRESSION: Normal MRI of the brain with and without contrast.  Trace LEFT mastoid effusion.   Electronically Signed   By: CElon Alas  On: 04/19/2014 22:27      Assessment/Plan:  Active Problems:   Neurosyphilis   Tinnitus   Secondary syphilis   High risk sexual behavior   Tobacco use disorder    DDidier Brandenburgis a 39y.o. male  with secondary syphilis RPR of 132 and tinnitus concerning for neurosyphilis  #1 Rule out neurosyphilis:  Extensive discussion with patient with [facility], Neurology (who graciously offfered to perform LP after Radiology refused --had initially been told they would do LP after request by Dr. MThurnell Garbe  We have decided to go with IV rocephin 2 grams daily x 2 weeks for empiric rx for Neurosyphillis  #2 high risk management having sex with other men:  --start truvada and can send him with script for #30 (he may not be able to afford without [facility] copay card +/- [employer] but he can find out what copay is  IF somehow his  HIV RNA is + I will change him to a full regimen  I will plan on seeing him in my clinic in next 2-3 weeks  He can certainly come and get [facility] card sooner that than for his PREP  I spent greater than 40 minutes with the patient including greater than 50% of time in face to face counsel of the patient and in coordination of their care.    LOS: 2 days   CAlcide Evener2/14/2016, 5:06 PM

## 2014-04-22 LAB — HIV ANTIBODY (ROUTINE TESTING W REFLEX): HIV SCREEN 4TH GENERATION: NONREACTIVE

## 2014-04-22 LAB — RPR, QUANT+TP ABS (REFLEX): T Pallidum Abs: POSITIVE — AB

## 2014-04-22 LAB — HEPATITIS C ANTIBODY (REFLEX): HCV AB: NEGATIVE

## 2014-04-23 LAB — HIV-1 RNA QUANT-NO REFLEX-BLD
HIV 1 RNA Quant: <20 copies/mL (ref <20)
HIV-1 RNA Quant, Log: <1.3 {Log} (ref <1.30)

## 2014-04-23 LAB — FLUORESCENT TREPONEMAL AB(FTA)-IGG-BLD: Fluorescent Treponemal Ab, IgG: REACTIVE — AB

## 2014-04-23 LAB — RPR, QUANT+TP ABS (REFLEX)
Rapid Plasma Reagin, Quant: 1:32 {titer} — ABNORMAL HIGH
T Pallidum Abs: POSITIVE — AB

## 2014-04-23 LAB — RPR: RPR: REACTIVE — AB

## 2014-05-08 ENCOUNTER — Encounter: Payer: Self-pay | Admitting: Medical

## 2014-05-08 ENCOUNTER — Other Ambulatory Visit: Payer: Self-pay | Admitting: Medical

## 2014-05-08 ENCOUNTER — Ambulatory Visit (INDEPENDENT_AMBULATORY_CARE_PROVIDER_SITE_OTHER): Payer: 59 | Admitting: Medical

## 2014-05-08 VITALS — BP 120/88 | HR 98 | Temp 98.5°F | Resp 15 | Ht 64.0 in | Wt 144.0 lb

## 2014-05-08 DIAGNOSIS — Z720 Tobacco use: Secondary | ICD-10-CM | POA: Diagnosis not present

## 2014-05-08 DIAGNOSIS — F17200 Nicotine dependence, unspecified, uncomplicated: Secondary | ICD-10-CM

## 2014-05-08 DIAGNOSIS — A523 Neurosyphilis, unspecified: Secondary | ICD-10-CM

## 2014-05-08 DIAGNOSIS — Z0000 Encounter for general adult medical examination without abnormal findings: Secondary | ICD-10-CM | POA: Diagnosis not present

## 2014-05-08 DIAGNOSIS — Z7251 High risk heterosexual behavior: Secondary | ICD-10-CM | POA: Diagnosis not present

## 2014-05-08 DIAGNOSIS — F418 Other specified anxiety disorders: Secondary | ICD-10-CM

## 2014-05-08 DIAGNOSIS — N528 Other male erectile dysfunction: Secondary | ICD-10-CM | POA: Diagnosis not present

## 2014-05-08 DIAGNOSIS — K037 Posteruptive color changes of dental hard tissues: Secondary | ICD-10-CM | POA: Diagnosis not present

## 2014-05-08 DIAGNOSIS — K036 Deposits [accretions] on teeth: Secondary | ICD-10-CM

## 2014-05-08 DIAGNOSIS — Z8249 Family history of ischemic heart disease and other diseases of the circulatory system: Secondary | ICD-10-CM | POA: Insufficient documentation

## 2014-05-08 DIAGNOSIS — R0789 Other chest pain: Secondary | ICD-10-CM

## 2014-05-08 DIAGNOSIS — Z23 Encounter for immunization: Secondary | ICD-10-CM

## 2014-05-08 DIAGNOSIS — H9312 Tinnitus, left ear: Secondary | ICD-10-CM | POA: Diagnosis not present

## 2014-05-08 DIAGNOSIS — F329 Major depressive disorder, single episode, unspecified: Secondary | ICD-10-CM | POA: Insufficient documentation

## 2014-05-08 DIAGNOSIS — F419 Anxiety disorder, unspecified: Secondary | ICD-10-CM

## 2014-05-08 LAB — POCT URINALYSIS DIPSTICK
Bilirubin, UA: NEGATIVE
Blood, UA: NEGATIVE
Glucose, UA: NEGATIVE
Ketones, UA: NEGATIVE
LEUKOCYTES UA: NEGATIVE
Nitrite, UA: NEGATIVE
PROTEIN UA: NEGATIVE
Spec Grav, UA: >=1.03
UROBILINOGEN UA: 0.2
pH, UA: 6

## 2014-05-08 MED ORDER — ALPRAZOLAM 0.5 MG PO TABS: 0.5000 mg | ORAL_TABLET | Freq: Two times a day (BID) | ORAL | Status: DC | PRN

## 2014-05-08 NOTE — Progress Notes (Addendum)
Subjective:   HPI  [name] [name] is a 39 y.o. male who presents for a complete physical. New patient today.  Was seeing health dept most recently.  Sees Dr. Tommy Medal, infectious disease tomorrow.    Medical care team includes: Dr. Tommy Medal Infectious Disease from recent hospitalization for syphilis. Dorothea Ogle, PA-C here for primary care establishing today.   Preventative care: Last ophthalmology visit:n/a Last dental visit:n/a Prior vaccinations: TD or Tdap:unsure Influenza:declined Pneumococcal:n/a  Concerns: Saturday was having chest pain, left arm pain, 5-6 times that day.  Lasted for minutes with associated sweats, maybe a little SOB.   Been dealing with a lot of stress.  Was hospitalized recently for syphilis, had horrible experience.  He attributes the chest pain to anxiety.  Denies chest pain prior or since that event Saturday.   Has ringing in the ears x 2 months.  Has intermittent problems with allergies, no hearing loss, but hearing is cloudy at times.  Mostly left sided.  Due to this symptoms, was assumed to have neurosyphilis with recently hospitalization.  Had treatment, but never actually had lumbar puncture. Sees ID tomorrow.  Reviewed their medical, surgical, family, social, medication, and allergy history and updated chart as appropriate.  Past Medical History  Diagnosis Date  . Reaction to contrast media 04/19/2014    vomited with Gadolinum Multihance   . 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    Marland Kitchen Hypertension     medication in 2012, stress as a big component  . Hyperlipidemia   . Anxiety 2012  . Depression 2012  . Erectile dysfunction   . Wears glasses     Past Surgical History  Procedure Laterality Date  . Pilonidal cyst excision  1995    History   Social History  . Marital Status: Single    Spouse Name: N/A  . Number of Children: N/A  . Years of Education: N/A   Occupational History  . Not on file.   Social History Main  Topics  . Smoking status: Current Every Day Smoker -- 1.00 packs/day for 23 years    Types: Cigarettes  . Smokeless tobacco: Never Used  . Alcohol Use: 14.4 oz/week    24 Cans of beer per week     Comment: "3-5 times/wk I'll have 3-5 beers; max of 24 beers/wk"  . Drug Use: No  . Sexual Activity: Yes   Other Topics Concern  . Not on file   Social History Narrative   Lives at home with roommate, exercise at gym in apartment complex, diet - no discretion.   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      Family History  Problem Relation Age of Onset  . Thyroid disease Mother   . Heart disease Father 31    MI, stents  . Peripheral Artery Disease Father   . Diabetes type II Father   . Diabetes Father   . Diabetes Maternal Grandmother   . Hypertension Maternal Grandfather   . Hypertension Paternal Grandfather   . Cancer Neg Hx   . Stroke Neg Hx      Current outpatient prescriptions:  .  emtricitabine-tenofovir (TRUVADA) 200-300 MG per tablet, Take 1 tablet by mouth daily., Disp: 30 tablet, Rfl: 2 .  ALPRAZolam (XANAX) 0.5 MG tablet, Take 1 tablet (0.5 mg total) by mouth 2 (two) times daily as needed for anxiety., Disp: 20 tablet, Rfl: 0 .  cefTRIAXone (ROCEPHIN) 1 G injection, Inject 2 g into the muscle daily., Disp:  1 each, Rfl: 0  No Known Allergies   Review of Systems Constitutional: -fever, -chills, -sweats, -unexpected weight change, -decreased appetite, +fatigue,+increased thirst Allergy: -sneezing, -itching, -congestion Dermatology: -changing moles, --rash, -lumps ENT: -runny nose, -ear pain, -sore throat, -hoarseness, -sinus pain, -teeth pain, +ringing in ears, -hearing loss, -nosebleeds Cardiology: +chest pain, -palpitations, -swelling, -difficulty breathing when lying flat, -waking up short of breath Respiratory: -cough, -shortness of breath, -difficulty breathing with exercise or exertion, -wheezing, -coughing up blood Gastroenterology: -abdominal pain, -nausea,  -vomiting, -diarrhea, -constipation, -blood in stool, -changes in bowel movement, -difficulty swallowing or eating Hematology: -bleeding, -bruising  Musculoskeletal: -joint aches, -muscle aches, -joint swelling, -back pain, -neck pain, -cramping, -changes in gait Ophthalmology: denies vision changes, eye redness, itching, discharge Urology: -burning with urination, -difficulty urinating, -blood in urine, -urinary frequency, -urgency, -incontinence Neurology: -headache, -weakness, -tingling, -numbness, -memory loss, -falls, -dizziness Psychology: +depressed mood, -agitation, -sleep problems     Objective:   Physical Exam  BP 120/88 mmHg  Pulse 98  Temp(Src) 98.5 F (36.9 C) (Oral)  Resp 15  Ht 5' 4"  (1.626 m)  Wt 144 lb (65.318 kg)  BMI 24.71 kg/m2  General appearance: alert, no distress, WD/WN, white male Skin: scattered macules, no particular worrisome lesions, no rash, resolved from recent syphilis, tattoos left chest with roman numerals, nipple piercing's bilaterally HEENT: normocephalic, conjunctiva/corneas normal, sclerae anicteric, PERRLA, EOMi, nares patent, no discharge or erythema, pharynx normal Oral cavity: MMM, tongue normal, teeth with moderate stain Neck: supple, no lymphadenopathy, no thyromegaly, no masses, normal ROM, no bruits Chest: non tender, normal shape and expansion Heart: RRR, normal S1, S2, no murmurs Lungs: CTA bilaterally, no wheezes, rhonchi, or rales Abdomen: +bs, soft, non tender, non distended, no masses, no hepatomegaly, no splenomegaly, no bruits Back: non tender, normal ROM, no scoliosis Musculoskeletal: upper extremities non tender, no obvious deformity, normal ROM throughout, lower extremities non tender, no obvious deformity, normal ROM throughout Extremities: no edema, no cyanosis, no clubbing Pulses: 2+ symmetric, upper and lower extremities, normal cap refill Neurological: alert, oriented x 3, CN2-12 intact, strength normal upper extremities  and lower extremities, sensation normal throughout, DTRs 2+ throughout, no cerebellar signs, gait normal Psychiatric: normal affect, behavior normal, pleasant  GU: normal male external genitalia, circumcised, nontender, no masses, no hernia, no lymphadenopathy Rectal: deferred   Adult ECG Report  Indication: chest pain  Rate: 92 bpm  Rhythm: normal sinus rhythm  QRS Axis: -33 degrees  PR Interval: 158m  QRS Duration: 1023m QTc: 445 ms  Conduction Disturbances: T wave abnormality V4-5, LVH pattern  Other Abnormalities: none  Patient's cardiac risk factors are: dyslipidemia, family history of premature cardiovascular disease, male gender and smoking/ tobacco exposure.  EKG comparison: none  Narrative Interpretation: left axis deviation, T wave abnormality V4, V5, LVH   Assessment and Plan :    Encounter Diagnoses  Name Primary?  . Encounter for health maintenance examination in adult Yes  . Need for Tdap vaccination   . Need for prophylactic vaccination against Streptococcus pneumoniae (pneumococcus)   . Other chest pain   . Family history of premature CAD   . Smoker   . Neurosyphilis   . Tinnitus, left   . Anxiety and depression   . Dental staining   . High risk sexual behavior   . Other male erectile dysfunction     Physical exam - discussed healthy lifestyle, diet, exercise, preventative care, vaccinations, and addressed their concerns.   Counseled on the Tdap (tetanus, diptheria, and acellular  pertussis) vaccine.  Vaccine information sheet given. Tdap vaccine given after consent obtained. Counseled on the pneumococcal vaccine.  Vaccine information sheet given.  Pneumococcal PPSV 23 vaccine given after consent obtained. See your eye doctor yearly for routine vision care. See your dentist yearly for routine dental care including hygiene visits twice yearly. tinnitus - possible related to syphilis.  He will discussed with ID, but if not improving in a few weeks, consider  ENT consult Chest pain-f/u pending labs and call back.  Possibly anxiety related ,but EKG abnormal, has signifnicant risk factors.  Will review EKG with supervising physician. Smoker-strongly advise he stop smoking particular in light of father's cardiac history We'll call lab results for CRP and lipid EKG reviewed Reviewed recent hospitalization notes and numerous labs in relation to his recent diagnosis of syphilis Anxiety and depression-advised counseling, continue Xanax when necessary follow-up in one month High risk sexual behavior-currently using Prep/Truvada. Discussed condom use, safe sex.  Homosexual male ED - f/u pending labs, likely cardiology eval Follow-up pending labs   Addendum: after reviewing chart and EKG with supervising physican, will get in with cardiology for eval.

## 2014-05-08 NOTE — Patient Instructions (Signed)
  Thank you for giving me the opportunity to serve you today.    Your diagnosis today includes: Encounter Diagnoses  Name Primary?  . Encounter for health maintenance examination in adult Yes  . Need for Tdap vaccination   . Need for prophylactic vaccination against Streptococcus pneumoniae (pneumococcus)   . Other chest pain   . Family history of premature CAD   . Smoker   . Neurosyphilis   . Tinnitus, left   . Anxiety and depression   . Dental staining      Specific recommendations today include: See your eye doctor yearly for routine vision care. See dentist soon for routine care and cleaning, a lot of dental staining today We will call with lab results Follow up with infectious disease physician I strongly recommend you stop smoking given your family history of heart and vascular disease as well as general risk of tobacco We updated your tetanus, diphtheria, and pertussis vaccine today We updated your pneumococcal vaccine today For now you can use Xanax as needed for chest pain or anxiety attacks, but I recommend you see a counselor I have listed counselor, dentist and eye doctor recommendations below  Return pending labs.   I have included other useful information below for your review.  Eye doctors [facility] Dr. Camillo Flaming [address] [place], [place] [address]  [email or web address]   Dr. Jonna Coup, dentist [address], [place], [place] [address] [phone] [email or web address] [phone] office [email or web address] [address], [place], [place], [place] [address]  Rema Fendt, therapist  Toy Cookey, MA, clinical psychologist  Cognitive-Behavior Therapy; Mood Disorders; Anxiety Disorders; adult and child ADHD; Family Therapy; Stress Management; personal growth, and Marital Therapy.    Terrance Mass Ph.D.,  clinical psychologist Cognitive-Behavior Therapy; Mood Disorders; Anxiety Disorders; Stress        [facility] Juliette Mangle, psychotherapist [address] [place], [place] [address] [phone]

## 2014-05-08 NOTE — Addendum Note (Signed)
Addended by: Carlena Hurl on: 05/08/2014 01:55 PM   Modules accepted: Level of Service

## 2014-05-09 ENCOUNTER — Encounter: Payer: Self-pay | Admitting: Internal Medicine

## 2014-05-09 ENCOUNTER — Telehealth: Payer: Self-pay | Admitting: Medical

## 2014-05-09 ENCOUNTER — Other Ambulatory Visit: Payer: Self-pay | Admitting: Medical

## 2014-05-09 ENCOUNTER — Ambulatory Visit (INDEPENDENT_AMBULATORY_CARE_PROVIDER_SITE_OTHER): Payer: 59 | Admitting: Internal Medicine

## 2014-05-09 VITALS — BP 153/103 | HR 102 | Temp 98.2°F | Ht 64.0 in | Wt 147.0 lb

## 2014-05-09 DIAGNOSIS — R0789 Other chest pain: Secondary | ICD-10-CM

## 2014-05-09 DIAGNOSIS — F17200 Nicotine dependence, unspecified, uncomplicated: Secondary | ICD-10-CM

## 2014-05-09 DIAGNOSIS — H9312 Tinnitus, left ear: Secondary | ICD-10-CM

## 2014-05-09 DIAGNOSIS — Z79899 Other long term (current) drug therapy: Secondary | ICD-10-CM

## 2014-05-09 DIAGNOSIS — Z7251 High risk heterosexual behavior: Secondary | ICD-10-CM

## 2014-05-09 DIAGNOSIS — A523 Neurosyphilis, unspecified: Secondary | ICD-10-CM

## 2014-05-09 DIAGNOSIS — A539 Syphilis, unspecified: Secondary | ICD-10-CM

## 2014-05-09 LAB — BASIC METABOLIC PANEL
BUN: 6 mg/dL (ref 6–23)
CHLORIDE: 105 meq/L (ref 96–112)
CO2: 24 mEq/L (ref 19–32)
Calcium: 9.5 mg/dL (ref 8.4–10.5)
Creat: 0.61 mg/dL (ref 0.50–1.35)
Glucose, Bld: 87 mg/dL (ref 70–99)
POTASSIUM: 4.4 meq/L (ref 3.5–5.3)
Sodium: 140 mEq/L (ref 135–145)

## 2014-05-09 LAB — LIPID PANEL
CHOL/HDL RATIO: 4.1 ratio
Cholesterol: 139 mg/dL (ref 0–200)
HDL: 34 mg/dL — AB (ref >=40)
LDL CALC: 82 mg/dL (ref 0–99)
Triglycerides: 117 mg/dL (ref <150)
VLDL: 23 mg/dL (ref 0–40)

## 2014-05-09 LAB — HIGH SENSITIVITY CRP: CRP, High Sensitivity: 5.5 mg/L — ABNORMAL HIGH

## 2014-05-09 MED ORDER — EMTRICITABINE-TENOFOVIR DF 200-300 MG PO TABS: 1.0000 | ORAL_TABLET | Freq: Every day | ORAL | Status: DC

## 2014-05-09 NOTE — Progress Notes (Signed)
thanks

## 2014-05-09 NOTE — Telephone Encounter (Signed)
pls ask jo to add BMET lab

## 2014-05-09 NOTE — Telephone Encounter (Signed)
Done

## 2014-05-09 NOTE — Assessment & Plan Note (Signed)
This persists however may resolve over the next 1-2 months if it was secondary to the neurosyphilis. If persists after that it may be due to another cause.

## 2014-05-09 NOTE — Progress Notes (Signed)
LM to [facility]

## 2014-05-09 NOTE — Progress Notes (Signed)
   Subjective:    Patient ID: [name] [name], male    DOB: Apr 28, 1975, 39 y.o.   MRN: [id number]  HPI He is here for hospital follow-up. He was [facility] the [facility] by Dr. Santo Held and was positive for syphilis in with new tinnitus was sent in for evaluation of neurosyphilis. Initially he was seen in the emergency room and they opted not to do the lumbar puncture and then seen by interventional radiology who again opted not to do the lumbar puncture and eventually he was sent out for treatment of neurosyphilis with Rocephin 2 g daily for 2 weeks.   Review of Systems  Constitutional: Negative for fatigue.  HENT:       Tinnitus   Gastrointestinal: Negative for nausea and diarrhea.  Skin: Negative for rash.  Neurological: Negative for dizziness and light-headedness.       Objective:   Physical Exam  Constitutional: He appears well-developed and well-nourished. No distress.  Eyes: No scleral icterus.  Skin: No rash noted.          Assessment & Plan:

## 2014-05-09 NOTE — Assessment & Plan Note (Addendum)
He will continue on Truvada and does have co-pay card. This can be monitored by his primary physician including periodic HIV and creatinine 2 sure no side effects. Otherwise he can come here for continued monitoring if needed.  I will refill his Truvada 5 months.

## 2014-05-09 NOTE — Assessment & Plan Note (Signed)
He has been treated for presumed neurosyphilis. No lumbar puncture has been done. PICC line is now out. He can return on a when necessary basis.

## 2014-05-13 ENCOUNTER — Ambulatory Visit
Admission: RE | Admit: 2014-05-13 | Discharge: 2014-05-13 | Disposition: A | Discharge status: Home / Self Care | Payer: 59 | Source: Ambulatory Visit | Attending: Medical | Admitting: Medical

## 2014-05-13 DIAGNOSIS — R0789 Other chest pain: Secondary | ICD-10-CM

## 2014-05-13 DIAGNOSIS — F17200 Nicotine dependence, unspecified, uncomplicated: Secondary | ICD-10-CM

## 2014-05-13 DIAGNOSIS — A539 Syphilis, unspecified: Secondary | ICD-10-CM

## 2014-05-20 ENCOUNTER — Encounter: Payer: Self-pay | Admitting: Infectious Disease

## 2014-05-23 ENCOUNTER — Encounter (HOSPITAL_COMMUNITY): Payer: Self-pay | Admitting: Cardiology

## 2014-05-23 ENCOUNTER — Inpatient Hospital Stay (HOSPITAL_COMMUNITY)
Admission: AD | Admit: 2014-05-23 | Discharge: 2014-05-25 | DRG: 247 | Disposition: A | Discharge status: Home / Self Care | Payer: 59 | Source: Ambulatory Visit | Attending: Cardiology | Admitting: Cardiology

## 2014-05-23 DIAGNOSIS — I2510 Atherosclerotic heart disease of native coronary artery without angina pectoris: Secondary | ICD-10-CM | POA: Diagnosis present

## 2014-05-23 DIAGNOSIS — A523 Neurosyphilis, unspecified: Secondary | ICD-10-CM | POA: Diagnosis present

## 2014-05-23 DIAGNOSIS — Z8249 Family history of ischemic heart disease and other diseases of the circulatory system: Secondary | ICD-10-CM

## 2014-05-23 DIAGNOSIS — I214 Non-ST elevation (NSTEMI) myocardial infarction: Principal | ICD-10-CM | POA: Diagnosis present

## 2014-05-23 DIAGNOSIS — Z21 Asymptomatic human immunodeficiency virus [HIV] infection status: Secondary | ICD-10-CM | POA: Diagnosis present

## 2014-05-23 DIAGNOSIS — Z9109 Other allergy status, other than to drugs and biological substances: Secondary | ICD-10-CM

## 2014-05-23 DIAGNOSIS — F329 Major depressive disorder, single episode, unspecified: Secondary | ICD-10-CM | POA: Diagnosis present

## 2014-05-23 DIAGNOSIS — Z833 Family history of diabetes mellitus: Secondary | ICD-10-CM

## 2014-05-23 DIAGNOSIS — E780 Pure hypercholesterolemia: Secondary | ICD-10-CM | POA: Diagnosis present

## 2014-05-23 DIAGNOSIS — I2109 ST elevation (STEMI) myocardial infarction involving other coronary artery of anterior wall: Secondary | ICD-10-CM | POA: Diagnosis present

## 2014-05-23 DIAGNOSIS — F419 Anxiety disorder, unspecified: Secondary | ICD-10-CM | POA: Diagnosis present

## 2014-05-23 DIAGNOSIS — Z91041 Radiographic dye allergy status: Secondary | ICD-10-CM

## 2014-05-23 DIAGNOSIS — I10 Essential (primary) hypertension: Secondary | ICD-10-CM | POA: Diagnosis present

## 2014-05-23 DIAGNOSIS — E785 Hyperlipidemia, unspecified: Secondary | ICD-10-CM | POA: Diagnosis present

## 2014-05-23 DIAGNOSIS — F32A Depression, unspecified: Secondary | ICD-10-CM | POA: Diagnosis present

## 2014-05-23 DIAGNOSIS — F17210 Nicotine dependence, cigarettes, uncomplicated: Secondary | ICD-10-CM | POA: Diagnosis present

## 2014-05-23 HISTORY — DX: Tobacco use: Z72.0

## 2014-05-23 HISTORY — DX: Personal history of other medical treatment: Z92.89

## 2014-05-23 HISTORY — DX: Atherosclerotic heart disease of native coronary artery without angina pectoris: I25.10

## 2014-05-23 LAB — CBC WITH DIFFERENTIAL/PLATELET
BASOS ABS: 0 10*3/uL (ref 0.0–0.1)
Basophils Relative: 0 % (ref 0–1)
EOS PCT: 2 % (ref 0–5)
Eosinophils Absolute: 0.2 10*3/uL (ref 0.0–0.7)
HCT: 44.1 % (ref 39.0–52.0)
HEMOGLOBIN: 15.2 g/dL (ref 13.0–17.0)
LYMPHS ABS: 2.6 10*3/uL (ref 0.7–4.0)
LYMPHS PCT: 31 % (ref 12–46)
MCH: 31.4 pg (ref 26.0–34.0)
MCHC: 34.5 g/dL (ref 30.0–36.0)
MCV: 91.1 fL (ref 78.0–100.0)
Monocytes Absolute: 1 10*3/uL (ref 0.1–1.0)
Monocytes Relative: 12 % (ref 3–12)
NEUTROS ABS: 4.5 10*3/uL (ref 1.7–7.7)
NEUTROS PCT: 55 % (ref 43–77)
Platelets: 241 10*3/uL (ref 150–400)
RBC: 4.84 MIL/uL (ref 4.22–5.81)
RDW: 14.2 % (ref 11.5–15.5)
WBC: 8.2 10*3/uL (ref 4.0–10.5)

## 2014-05-23 LAB — TROPONIN I
TROPONIN I: 3.6 ng/mL — AB (ref <0.031)
Troponin I: 3.48 ng/mL (ref <0.031)

## 2014-05-23 LAB — COMPREHENSIVE METABOLIC PANEL
ALBUMIN: 3.5 g/dL (ref 3.5–5.2)
ALK PHOS: 62 U/L (ref 39–117)
ALT: 33 U/L (ref 0–53)
AST: 56 U/L — AB (ref 0–37)
Anion gap: 6 (ref 5–15)
BUN: 10 mg/dL (ref 6–23)
CHLORIDE: 106 mmol/L (ref 96–112)
CO2: 29 mmol/L (ref 19–32)
Calcium: 9.1 mg/dL (ref 8.4–10.5)
Creatinine, Ser: 0.68 mg/dL (ref 0.50–1.35)
GFR calc Af Amer: >90 mL/min (ref >90)
Glucose, Bld: 95 mg/dL (ref 70–99)
Potassium: 3.8 mmol/L (ref 3.5–5.1)
SODIUM: 141 mmol/L (ref 135–145)
Total Bilirubin: 0.9 mg/dL (ref 0.3–1.2)
Total Protein: 7.1 g/dL (ref 6.0–8.3)

## 2014-05-23 LAB — LIPID PANEL
CHOL/HDL RATIO: 3 ratio
CHOLESTEROL: 139 mg/dL (ref 0–200)
HDL: 46 mg/dL (ref >39)
LDL CALC: 79 mg/dL (ref 0–99)
Triglycerides: 69 mg/dL (ref <150)
VLDL: 14 mg/dL (ref 0–40)

## 2014-05-23 LAB — TSH: TSH: 1.296 u[IU]/mL (ref 0.350–4.500)

## 2014-05-23 MED ORDER — SODIUM CHLORIDE 0.9 % IV SOLN: 250.0000 mL | INTRAVENOUS | Status: DC | PRN

## 2014-05-23 MED ORDER — ASPIRIN EC 81 MG PO TBEC
81.0000 mg | DELAYED_RELEASE_TABLET | Freq: Every day | ORAL | Status: DC
  Administered 2014-05-25: 10:00:00 81 mg via ORAL
  Filled 2014-05-23: qty 1

## 2014-05-23 MED ORDER — ATORVASTATIN CALCIUM 40 MG PO TABS
40.0000 mg | ORAL_TABLET | Freq: Every day | ORAL | Status: DC
  Administered 2014-05-23 – 2014-05-24 (×2): 40 mg via ORAL
  Filled 2014-05-23 (×3): qty 1

## 2014-05-23 MED ORDER — SODIUM CHLORIDE 0.9 % IJ SOLN: 3.0000 mL | Freq: Two times a day (BID) | INTRAMUSCULAR | Status: DC

## 2014-05-23 MED ORDER — ONDANSETRON HCL 4 MG/2ML IJ SOLN: 4.0000 mg | Freq: Four times a day (QID) | INTRAMUSCULAR | Status: DC | PRN

## 2014-05-23 MED ORDER — METOPROLOL TARTRATE 25 MG PO TABS
25.0000 mg | ORAL_TABLET | Freq: Two times a day (BID) | ORAL | Status: DC
  Administered 2014-05-23 – 2014-05-25 (×3): 25 mg via ORAL
  Filled 2014-05-23 (×3): qty 1

## 2014-05-23 MED ORDER — ALPRAZOLAM 0.5 MG PO TABS
0.5000 mg | ORAL_TABLET | Freq: Two times a day (BID) | ORAL | Status: DC | PRN
  Administered 2014-05-24: 0.5 mg via ORAL
  Filled 2014-05-23: qty 1

## 2014-05-23 MED ORDER — NITROGLYCERIN 0.4 MG SL SUBL: 0.4000 mg | SUBLINGUAL_TABLET | SUBLINGUAL | Status: DC | PRN

## 2014-05-23 MED ORDER — SODIUM CHLORIDE 0.9 % IJ SOLN
3.0000 mL | Freq: Two times a day (BID) | INTRAMUSCULAR | Status: DC
  Administered 2014-05-24: 3 mL via INTRAVENOUS

## 2014-05-23 MED ORDER — ASPIRIN 81 MG PO CHEW
324.0000 mg | CHEWABLE_TABLET | ORAL | Status: AC
  Administered 2014-05-23: 324 mg via ORAL
  Filled 2014-05-23: qty 4

## 2014-05-23 MED ORDER — HEPARIN (PORCINE) IN NACL 100-0.45 UNIT/ML-% IJ SOLN
800.0000 [IU]/h | INTRAMUSCULAR | Status: DC
  Administered 2014-05-23: 800 [IU]/h via INTRAVENOUS
  Filled 2014-05-23: qty 250

## 2014-05-23 MED ORDER — ASPIRIN 81 MG PO CHEW
81.0000 mg | CHEWABLE_TABLET | ORAL | Status: AC
  Administered 2014-05-24: 81 mg via ORAL
  Filled 2014-05-23: qty 1

## 2014-05-23 MED ORDER — SODIUM CHLORIDE 0.9 % IJ SOLN: 3.0000 mL | INTRAMUSCULAR | Status: DC | PRN

## 2014-05-23 MED ORDER — ASPIRIN 300 MG RE SUPP
300.0000 mg | RECTAL | Status: AC
  Filled 2014-05-23: qty 1

## 2014-05-23 MED ORDER — ENOXAPARIN SODIUM 40 MG/0.4ML ~~LOC~~ SOLN
40.0000 mg | SUBCUTANEOUS | Status: DC
  Administered 2014-05-23: 40 mg via SUBCUTANEOUS
  Filled 2014-05-23: qty 0.4

## 2014-05-23 MED ORDER — EMTRICITABINE-TENOFOVIR DF 200-300 MG PO TABS
1.0000 | ORAL_TABLET | Freq: Every day | ORAL | Status: DC
  Administered 2014-05-24: 18:00:00 1 via ORAL
  Filled 2014-05-23 (×3): qty 1

## 2014-05-23 MED ORDER — ACETAMINOPHEN 325 MG PO TABS: 650.0000 mg | ORAL_TABLET | ORAL | Status: DC | PRN

## 2014-05-23 NOTE — Progress Notes (Signed)
CRITICAL VALUE ALERT  Critical value received:  Troponin 3.48  Date of notification:  05/23/14 Time of notification:  1940  Critical value read back:yes  Nurse who received alert:  A.Star Resler,rn  MD notified (1st page):  Dr. Wynonia Lawman at bedside  Time of first page: 1940  MD notified (2nd page):  Time of second page:  Responding MD:  Dr. Wynonia Lawman  Time MD responded:  [phone]

## 2014-05-23 NOTE — Progress Notes (Signed)
  Echocardiogram 2D Echocardiogram has been performed.  [name] [name] 05/23/2014, 6:01 PM

## 2014-05-23 NOTE — Progress Notes (Signed)
ANTICOAGULATION CONSULT NOTE - Initial Consult  Pharmacy Consult:  Heparin Indication: chest pain/ACS  Allergies  Allergen Reactions  . Gadolinium Derivatives Nausea Only    Patient Measurements: Height: 5' 4"  (162.6 cm) Weight: 144 lb (65.318 kg) IBW/kg (Calculated) : 59.2 Heparin Dosing Weight: 65 kg  Vital Signs: Temp: 99.2 F (37.3 C) (03/17 1652) Temp Source: Oral (03/17 1652) BP: 138/90 mmHg (03/17 1652) Pulse Rate: 106 (03/17 1652)  Labs:  Recent Labs  05/23/14 1806  HGB 15.2  HCT 44.1  PLT 241  CREATININE 0.68  TROPONINI 3.48*    Estimated Creatinine Clearance: 102.8 mL/min (by C-G formula based on Cr of 0.68).   Medical History: Past Medical History  Diagnosis Date  . 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    Marland Kitchen Hypertension     medication in 2012, stress as a big component  . Hyperlipidemia   . Anxiety 2012  . Depression 2012  . Erectile dysfunction       Assessment: 67 YOM with history of neurosyphilis, HIV and recent anterior infarction to start IV heparin for rule out ACS.  Baseline labs and home meds reviewed.  Noted patient received prophylactic Lovenox several minutes ago.   Goal of Therapy:  Heparin level 0.3-0.7 units/ml Monitor platelets by anticoagulation protocol: Yes    Plan:  - D/C Lovenox - Heparin gtt at 800 units/hr, no bolus given recent Lovenox administration - Check 6 hr HL - Daily HL / CBC    Leelyn Jasinski D. Mina Marble, PharmD, BCPS Pager:  [phone] 05/23/2014, 8:28 PM

## 2014-05-23 NOTE — H&P (Addendum)
History and Physical   Admit date: 05/23/2014 Name:  Derryck Shahan Medical record number: [id number] DOB/Age:  03-24-1975  39 y.o. male  Referring Physician:  Dorothea Ogle  Chief complaint/reason for admission: chest pain, abnormal EKG  HPI:   This 39 year old male is admitted to the hospital for treatment of a possible recent anterior infarction. The patient has a history of neurosyphilis and was treated in the hospital with ceftriaxone and had a PICC line. He evidently was recommended to have a lumbar puncture but eventually refused this and was just treated empirically  for neurosyphilis on the basis of tinnitus.  He had 2 weeks of total treatment and then had his PICC line removed.  He presented for a physical examination on 05/08/14 and complained of some intermittent chest discomfort. The chest discomfort was associated with stress or anxiety and did not occur with exertion. He was given an EKG that showed some anterolateral T wave inversions and an appointment was made to see me today.  One week ago on Thursday evening he had significant pain involving his upper chest and shoulders that lasted all evening and was associated with some mild sweating and shortness of breath.  Slept in the next day and did not go to work but has gone back to work since then.  He does not recall any chest pain since then.  He did note last evening while he was having sex with his partner that he had some shortness of breath and may have had some pounding but could not say definitely whether he had chest pain or not.   He complained of some sharp burning pain in his mid back area of days ago that was continuous has resolved now. He is in the office today and is currently asymptomatic. He has been hypertensive for some time and also has smoked for about 23 years.  He formerly took medicine for hypercholesterolemia and blood pressure but was uninsured for 3 years and did not take medicines during that time.  His father had  a history of premature cardiovascular disease in his 65s. He was seen in the office today he had an EKG that was suggestive of an acute anterior infarction although he was completely asymptomatic. He had significant Q waves and persistent ST elevation across his precordial leads. I recommended in spite of the fact that he was asymptomatic that he be admitted to the hospital for evaluation including cardiac enzymes, echocardiogram and likely cardiac catheterization.   Past Medical History  Diagnosis Date  . 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    Marland Kitchen Hypertension     medication in 2012, stress as a big component  . Hyperlipidemia   . Anxiety 2012  . Depression 2012  . Erectile dysfunction      Past Surgical History  Procedure Laterality Date  . Pilonidal cyst excision  1995   Allergies: has No Known Allergies.   Medications: Prior to Admission medications   Medication Sig Start Date End Date Taking? Authorizing Provider  ALPRAZolam Duanne Moron) 0.5 MG tablet Take 1 tablet (0.5 mg total) by mouth 2 (two) times daily as needed for anxiety. 05/08/14  Yes Camelia Eng Tysinger, PA-C  emtricitabine-tenofovir (TRUVADA) 200-300 MG per tablet Take 1 tablet by mouth daily. 05/09/14   Thayer Headings, MD   Family History:  Family Status  Relation Status Death Age  . Mother Alive     CAD, DM  . Father Alive     Thyroid disease  .  Sister Alive   . Brother Alive   . Sister Alive   . Brother Alive    Social History:   reports that he has been smoking Cigarettes.  He has a 23 pack-year smoking history. He has never used smokeless tobacco. He reports that he drinks about 14.4 oz of alcohol per week. He reports that he does not use illicit drugs.   History   Social History Narrative   Lives at home with roommate, exercise at gym in apartment complex, diet - no discretion.   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       Review of Systems:   Other than as noted above, the remainder of the  review of systems is normal  Physical Exam: BP 138/90 mmHg  Pulse 106  Temp(Src) 99.2 F (37.3 C) (Oral)  Resp 20  Ht 5' 4"  (1.626 m)  Wt 65.318 kg (144 lb)  BMI 24.71 kg/m2  SpO2 98% General appearance: Pleasan bearded male in NAD Head: Normocephalic, without obvious abnormality, atraumatic Eyes: conjunctivae/corneas clear. PERRL, EOM's intact. Fundi not examined Neck: no adenopathy, no carotid bruit, no JVD and supple, symmetrical, trachea midline Lungs: clear to auscultation bilaterally Heart: regular rate and rhythm, S1, S2 normal, no murmur, click, rub or gallop Abdomen: soft, non-tender; bowel sounds normal; no masses,  no organomegaly Rectal: deferred Extremities: extremities normal, atraumatic, no cyanosis or edema Pulses: 2+ and symmetric Skin: Skin color, texture, turgor normal. No rashes or lesions Neurologic: Grossly normal  Labs: Thyroid   Lab Results  Component Value Date   TSH 1.548 04/19/2014    EKG: Sinus rhythm, extensive changes of anterior MI with persistent ST elevation.   IMPRESSIONS: 1. Abnormal EKG with an acute anterior infarction of undetermined age-clinically was one week ago but need to be sure it is not more recent 2. Hypertension 3. Tobacco abuse 4. History of neurosyphilis clinically recently empirically treated 5. High risk sexual behavior  PLAN:  Recommendations:  Have lab work and we will obtain an echocardiogram today. He likely will require cardiac catheterization. He will be placed on aspirin and placed on beta blocker therapy and likely ACE inhibitor. He will have lipid panel done and likely be placed on statin therapy. Further workup to follow.  Signed: Kerry Hough MD [facility]  05/23/2014, 5:33 PM

## 2014-05-23 NOTE — Progress Notes (Signed)
Echo was reviewed and shows a mid to distal anteroseptal and anterior infarction on the echo with an EF of 45-50%.  History reviewed extensively with patient tonight.  Initial troponin is 3.48.  Clinically it sounds like he had an infarct one week ago.  We'll begin intravenous heparin tonight and plan catheterization in the morning unless he develops recurrent symptoms or a change in his troponin elevation.  Cardiac catheterization procedure was discussed with the patient fully including risks of myocardial infarction, death, stroke, bleeding, arrhythmia, dye allergy, or renal insufficiency. The patient understands and is willing to proceed.  Discussed options of stenting, potential bypass or medical therapy with him and discuss risks of stenting with him and he is agreeable to proceed.  Kerry Hough MD [facility]

## 2014-05-24 ENCOUNTER — Encounter (HOSPITAL_COMMUNITY): Payer: Self-pay | Admitting: General Practice

## 2014-05-24 ENCOUNTER — Encounter (HOSPITAL_COMMUNITY)
Admission: AD | Disposition: A | Discharge status: Home / Self Care | Payer: 59 | Source: Ambulatory Visit | Attending: Cardiology

## 2014-05-24 DIAGNOSIS — E780 Pure hypercholesterolemia: Secondary | ICD-10-CM | POA: Diagnosis present

## 2014-05-24 DIAGNOSIS — Z833 Family history of diabetes mellitus: Secondary | ICD-10-CM | POA: Diagnosis not present

## 2014-05-24 DIAGNOSIS — Z91041 Radiographic dye allergy status: Secondary | ICD-10-CM | POA: Diagnosis not present

## 2014-05-24 DIAGNOSIS — I2510 Atherosclerotic heart disease of native coronary artery without angina pectoris: Secondary | ICD-10-CM | POA: Diagnosis present

## 2014-05-24 DIAGNOSIS — F329 Major depressive disorder, single episode, unspecified: Secondary | ICD-10-CM | POA: Diagnosis present

## 2014-05-24 DIAGNOSIS — I2102 ST elevation (STEMI) myocardial infarction involving left anterior descending coronary artery: Secondary | ICD-10-CM

## 2014-05-24 DIAGNOSIS — F17210 Nicotine dependence, cigarettes, uncomplicated: Secondary | ICD-10-CM | POA: Diagnosis present

## 2014-05-24 DIAGNOSIS — A523 Neurosyphilis, unspecified: Secondary | ICD-10-CM | POA: Diagnosis present

## 2014-05-24 DIAGNOSIS — I10 Essential (primary) hypertension: Secondary | ICD-10-CM | POA: Diagnosis present

## 2014-05-24 DIAGNOSIS — F419 Anxiety disorder, unspecified: Secondary | ICD-10-CM | POA: Diagnosis present

## 2014-05-24 DIAGNOSIS — Z8249 Family history of ischemic heart disease and other diseases of the circulatory system: Secondary | ICD-10-CM | POA: Diagnosis not present

## 2014-05-24 DIAGNOSIS — R079 Chest pain, unspecified: Secondary | ICD-10-CM | POA: Diagnosis present

## 2014-05-24 DIAGNOSIS — I214 Non-ST elevation (NSTEMI) myocardial infarction: Secondary | ICD-10-CM | POA: Diagnosis present

## 2014-05-24 DIAGNOSIS — Z21 Asymptomatic human immunodeficiency virus [HIV] infection status: Secondary | ICD-10-CM | POA: Diagnosis present

## 2014-05-24 DIAGNOSIS — Z9109 Other allergy status, other than to drugs and biological substances: Secondary | ICD-10-CM | POA: Diagnosis not present

## 2014-05-24 HISTORY — PX: LEFT HEART CATHETERIZATION WITH CORONARY ANGIOGRAM: SHX5451

## 2014-05-24 HISTORY — PX: CORONARY STENT PLACEMENT: SHX1402

## 2014-05-24 HISTORY — PX: PERCUTANEOUS CORONARY STENT INTERVENTION (PCI-S): SHX5485

## 2014-05-24 LAB — TROPONIN I: Troponin I: 3.82 ng/mL (ref <0.031)

## 2014-05-24 LAB — PROTIME-INR
INR: 1.04 (ref 0.00–1.49)
Prothrombin Time: 13.7 seconds (ref 11.6–15.2)

## 2014-05-24 LAB — HEPARIN LEVEL (UNFRACTIONATED): HEPARIN UNFRACTIONATED: 0.29 [IU]/mL — AB (ref 0.30–0.70)

## 2014-05-24 LAB — POCT ACTIVATED CLOTTING TIME: Activated Clotting Time: 343 seconds

## 2014-05-24 SURGERY — LEFT HEART CATHETERIZATION WITH CORONARY ANGIOGRAM
Anesthesia: LOCAL

## 2014-05-24 MED ORDER — MIDAZOLAM HCL 2 MG/2ML IJ SOLN
INTRAMUSCULAR | Status: AC
  Filled 2014-05-24: qty 2

## 2014-05-24 MED ORDER — BIVALIRUDIN 250 MG IV SOLR
INTRAVENOUS | Status: AC
  Filled 2014-05-24: qty 250

## 2014-05-24 MED ORDER — ONDANSETRON HCL 4 MG/2ML IJ SOLN: 4.0000 mg | Freq: Four times a day (QID) | INTRAMUSCULAR | Status: DC | PRN

## 2014-05-24 MED ORDER — FENTANYL CITRATE 0.05 MG/ML IJ SOLN
INTRAMUSCULAR | Status: AC
  Filled 2014-05-24: qty 2

## 2014-05-24 MED ORDER — LIDOCAINE HCL (PF) 1 % IJ SOLN
INTRAMUSCULAR | Status: AC
  Filled 2014-05-24: qty 30

## 2014-05-24 MED ORDER — TICAGRELOR 90 MG PO TABS
ORAL_TABLET | ORAL | Status: AC
  Filled 2014-05-24: qty 1

## 2014-05-24 MED ORDER — TICAGRELOR 90 MG PO TABS
90.0000 mg | ORAL_TABLET | Freq: Two times a day (BID) | ORAL | Status: DC
  Administered 2014-05-24 – 2014-05-25 (×2): 90 mg via ORAL
  Filled 2014-05-24 (×3): qty 1

## 2014-05-24 MED ORDER — SODIUM CHLORIDE 0.9 % IV SOLN: 1.0000 mL/kg/h | INTRAVENOUS | Status: DC

## 2014-05-24 MED ORDER — HEPARIN (PORCINE) IN NACL 2-0.9 UNIT/ML-% IJ SOLN
INTRAMUSCULAR | Status: AC
  Filled 2014-05-24: qty 1000

## 2014-05-24 MED ORDER — ASPIRIN 81 MG PO CHEW: 81.0000 mg | CHEWABLE_TABLET | Freq: Every day | ORAL | Status: DC

## 2014-05-24 MED ORDER — ACETAMINOPHEN 325 MG PO TABS: 650.0000 mg | ORAL_TABLET | ORAL | Status: DC | PRN

## 2014-05-24 MED ORDER — LISINOPRIL 5 MG PO TABS
5.0000 mg | ORAL_TABLET | Freq: Every day | ORAL | Status: DC
  Administered 2014-05-24 – 2014-05-25 (×2): 5 mg via ORAL
  Filled 2014-05-24 (×2): qty 1

## 2014-05-24 MED ORDER — NITROGLYCERIN 1 MG/10 ML FOR IR/CATH LAB
INTRA_ARTERIAL | Status: AC
  Filled 2014-05-24: qty 10

## 2014-05-24 NOTE — CV Procedure (Addendum)
    PROCEDURE:  PCI LAD  INDICATIONS:  NSTEMI, anterior MI  The risks, benefits, and details of the procedure were explained to the patient.  The patient verbalized understanding and wanted to proceed.  Informed written consent was obtained.  Patient with ST segment changes indicating anterior ischemia. Troponins were markedly positive.  PROCEDURE TECHNIQUE:  The diagnostic cath was performed by Dr. Wynonia Lawman. This revealed a 99% stenosis in the mid LAD. The decision was made to perform intervention. Angiomax was used for anticoagulation. Brilinta was used for antiplatelet therapy. Upon guide placement and initial angiography with the CLS 3.5 guiding catheter, it was noted that the LAD was occluded. The patient was not having any active symptoms with this. A fielder XT wire was quickly advanced into the LAD and used to cross the LAD stenosis. A 2.0 x 12 balloon was used to predilate. This restored TIMI 3 flow although the the distal vessel was quite small. Intracoronary nitroglycerin was given. A 2.5 balloon was used to predilate. After additional intracoronary nitroglycerin, there was much better flow in the vessel appeared much larger. A 3.0 x 28 Synergy drug-eluting stent was deployed to cover the entire segment of disease. The stent was postdilated with a 3.25 noncompliant balloon to high pressure. There is no residual stenosis. The jailed diagonal had normal flow. The patient tolerated the procedure well. A CT had confirmed adequate anticoagulation. The Angiomax was decreased to the 0.25 dose. An Angio-Seal was deployed in the right groin for hemostasis..    CONTRAST:  Total of 100 cc.  COMPLICATIONS:  None.     IMPRESSIONS:  1. Successful PCI of the mid left anterior descending artery with a 3.0 x 28 Synergy drug-eluting stent, postdilated to 3.3 mm in diameter.   RECOMMENDATION:  I stressed the importance of dual antiplatelet therapy. We did check with the pharmacy that Brilinta did not  interact with any of his other medicines, particularly the HIV medicine, which he apparently takes for prophylaxis. He should continue with aggressive secondary prevention. Further plans per Dr. Wynonia Lawman.

## 2014-05-24 NOTE — Progress Notes (Signed)
UR completed 

## 2014-05-24 NOTE — Progress Notes (Signed)
Patient films reviewed and patient stable following catheterization.  His troponin are flat and suggest a more remote infarction occurring one week ago.  His EKG shows persistent ST elevation which is unchanged from EKG yesterday.  Between the interval of the cath and the PCI a few minutes later the artery had closed with no symptoms so I suspect that he may not have much recovery distally.  Long discussion with patient about need to stop smoking, risk factor modification.  He will be discharged on atorvastatin, BRILINTA, aspirin, metoprolol, and lisinopril.  Follow-up with me in one week.  Kerry Hough MD [facility]

## 2014-05-24 NOTE — CV Procedure (Signed)
Cardiac Catheterization Report   [name] [name]    39 y.o.  male  DOB: Apr 19, 1975  MRN: [id number]  Today's Date: 05/24/2014    PROCEDURE:  Left heart catheterization with selective coronary angiography, left ventriculogram.  INDICATIONS: Recent anterior infarction with persistent ST elevation, positive troponins  The risks, benefits, and details of the procedure were explained to the patient.  The patient verbalized understanding and wanted to proceed.  Informed written consent was obtained.  PROCEDURE TECHNIQUE: Versed 2 mg intravenously was used for sedation.  After Xylocaine anesthesia a 44F sheath was placed in the right femoral artery with a single anterior needle wall stick.   Left coronary angiography was done using a Judkins L4 guide catheter.  Right coronary angiography was done using a Judkins R4 guide catheter.  A 30 cc ventriculogram was performed in the 30 degree RAO projection.  While the patient was on the table, the digital images were reviewed with interventional cardiologist and decision was made to proceed with percutaneous intervention.   CONTRAST:  Total of 80 cc.  ESTIMATED BLOOD LOSS:  Minimal.  COMPLICATIONS:  None.    HEMODYNAMICS:  Aortic postcontrast 110/71, LV postcontrast 110/716  There was no gradient between the left ventricle and aorta.    ANGIOGRAPHIC DATA:    CORONARY ARTERIES:   Arise and distribute normally.  Right dominant. No coronary calcification is noted.  Left main coronary artery: Normal   Left anterior descending: Segmental narrowing proximally estimated at 40%.  After the septal perforator and the first diagonal is a severe segmental 99% stenosis with TIMI 2 flow distally.  There is collateral filling of this vessel seen coming from the right coronary artery.    Circumflex coronary artery: Normal.  Right coronary artery: Proximal 30% and distal 30% stenosis.  LEFT VENTRICULOGRAM:  Performed in the  30 RAO projection.  The aortic and mitral valves are normal. The left ventricle is normal in size with severe hypokinesis of the distal inferior wall apex and distal anterior wall. Estimated ejection fraction is 55%  IMPRESSIONS:  1. Severe coronary atherosclerotic heart disease with severe proximal left anterior descending coronary artery after the diagonal branch and mild right coronary artery disease 2.   Abnormal ventricular function with distal inferior apical and distal anterior hypokinesis  RECOMMENDATION:  We'll proceed with percutaneous intervention of the severe proximal left anterior descending coronary artery by Dr. Irish Lack.  He may have irreversible damage to the distal portion of the artery but will revascularize hoping that with collateral formation that he may have some recovery of LV function.  Kerry Hough MD [facility]

## 2014-05-24 NOTE — Progress Notes (Signed)
Post PCI EKG completed and reading stated Acute MI, patient is pain free at this time. Dr. Irish Lack is aware of EKG findings.

## 2014-05-24 NOTE — Interval H&P Note (Signed)
History and Physical Interval Note:    Patient seen and examined.  No interval change in history and exam since last note.  Stable for procedure.  Kerry Hough. MD [facility]  05/24/2014        05/24/2014 7:33 AM

## 2014-05-25 ENCOUNTER — Encounter (HOSPITAL_COMMUNITY): Payer: Self-pay | Admitting: Nurse Practitioner

## 2014-05-25 DIAGNOSIS — E785 Hyperlipidemia, unspecified: Secondary | ICD-10-CM | POA: Diagnosis present

## 2014-05-25 DIAGNOSIS — I10 Essential (primary) hypertension: Secondary | ICD-10-CM | POA: Diagnosis present

## 2014-05-25 LAB — CBC
HCT: 43.8 % (ref 39.0–52.0)
HEMOGLOBIN: 14.7 g/dL (ref 13.0–17.0)
MCH: 31.3 pg (ref 26.0–34.0)
MCHC: 33.6 g/dL (ref 30.0–36.0)
MCV: 93.2 fL (ref 78.0–100.0)
PLATELETS: 246 10*3/uL (ref 150–400)
RBC: 4.7 MIL/uL (ref 4.22–5.81)
RDW: 14 % (ref 11.5–15.5)
WBC: 8.5 10*3/uL (ref 4.0–10.5)

## 2014-05-25 LAB — BASIC METABOLIC PANEL
Anion gap: 8 (ref 5–15)
CALCIUM: 8.9 mg/dL (ref 8.4–10.5)
CO2: 27 mmol/L (ref 19–32)
Chloride: 104 mmol/L (ref 96–112)
Creatinine, Ser: 0.69 mg/dL (ref 0.50–1.35)
GFR calc Af Amer: >90 mL/min (ref >90)
GFR calc non Af Amer: >90 mL/min (ref >90)
Glucose, Bld: 101 mg/dL — ABNORMAL HIGH (ref 70–99)
Potassium: 4 mmol/L (ref 3.5–5.1)
SODIUM: 139 mmol/L (ref 135–145)

## 2014-05-25 MED ORDER — TICAGRELOR 90 MG PO TABS: 90.0000 mg | ORAL_TABLET | Freq: Two times a day (BID) | ORAL | Status: DC

## 2014-05-25 MED ORDER — ASPIRIN 81 MG PO TBEC: 81.0000 mg | DELAYED_RELEASE_TABLET | Freq: Every day | ORAL | Status: AC

## 2014-05-25 MED ORDER — METOPROLOL TARTRATE 25 MG PO TABS: 25.0000 mg | ORAL_TABLET | Freq: Two times a day (BID) | ORAL | Status: DC

## 2014-05-25 MED ORDER — LISINOPRIL 5 MG PO TABS: 5.0000 mg | ORAL_TABLET | Freq: Every day | ORAL | Status: DC

## 2014-05-25 MED ORDER — ATORVASTATIN CALCIUM 40 MG PO TABS: 40.0000 mg | ORAL_TABLET | Freq: Every day | ORAL | Status: DC

## 2014-05-25 MED ORDER — NITROGLYCERIN 0.4 MG SL SUBL: 0.4000 mg | SUBLINGUAL_TABLET | SUBLINGUAL | Status: DC | PRN

## 2014-05-25 NOTE — Care Management Note (Signed)
    Page 1 of 1   05/25/2014     10:27:02 AM CARE MANAGEMENT NOTE 05/25/2014  Patient:  [name] [name],[name] [name]   Account Number:  [id number]  Date Initiated:  05/25/2014  Documentation initiated by:  [facility]  Subjective/Objective Assessment:   adm: Left heart catheterization with selective coronary angiography, left ventriculogram.     Action/Plan:   discharge planning   Anticipated DC Date:  05/25/2014   Anticipated DC Plan:  [place]  CM consult      Choice offered to / List presented to:             Status of service:  Completed, signed off Medicare Important Message given?   (If response is "NO", the following Medicare IM given date fields will be blank) Date Medicare IM given:   Medicare IM given by:   Date Additional Medicare IM given:   Additional Medicare IM given by:    Discharge Disposition:  HOME/SELF CARE  Per UR Regulation:    If discussed at Long Length of Stay Meetings, dates discussed:    Comments:  05/25/14 10:00 CM met with pt and gave him  a Free 30 day trial card for Brilinta.  Pt verbalized understanding the 30 day s will give hie tome to have th eoffic staff, at his follow up visit, call insurance for authorization.  Once authorized, he will use the other discount card.  No other Cm needs were communicated.  Mariane Masters, BSN, CM [phone].

## 2014-05-25 NOTE — Progress Notes (Signed)
CARDIAC REHAB PHASE I   PRE:  Rate/Rhythm: 90 sinus  BP:  Supine:   Sitting: 122/87  Standing:      MODE:  Ambulation: 600 ft   POST:  Rate/Rhythem: 102 sinus  BP:  Supine:   Sitting: 138/114  Standing:     Pt ambulated 600 ft with assist x1.  Tolerated walk well without complaint.  Did note that cath site felt better today than yesterday. Eager to go home.  Pt returned to bedside to review discharge education.  Discussed diet, exercise, restrictions, stent book, Brilinta, NTG use, CP, when to call 911/MD, and given MI book.  Also reviewed importance of quitting smoking and was given fake cigarette to help.  Discussed Phase II Cardiac Rehab with request to have info sent here to [facility].  Pt voiced understanding. Alberteen Sam, MA, ACSM RCEP [phone]  Clotilde Dieter

## 2014-05-25 NOTE — Progress Notes (Signed)
Patient ID: [name] [name], male   DOB: [birth year], 39 y.o.   MRN: [id number]     Subjective:    No complaints this AM  Objective:   Temp:  [97.8 F (36.6 C)-99.6 F (37.6 C)] 99.1 F (37.3 C) (03/19 0735) Pulse Rate:  [73-98] 87 (03/19 0735) Resp:  [12-22] 14 (03/19 0735) BP: (113-138)/(61-94) 122/89 mmHg (03/19 0735) SpO2:  [95 %-99 %] 98 % (03/19 0735) Weight:  [149 lb 4 oz (67.7 kg)] 149 lb 4 oz (67.7 kg) (03/19 0400) Last BM Date: 05/23/14  Filed Weights   05/23/14 1652 05/25/14 0400  Weight: 144 lb (65.318 kg) 149 lb 4 oz (67.7 kg)    Intake/Output Summary (Last 24 hours) at 05/25/14 0737 Last data filed at 05/25/14 0736  Gross per 24 hour  Intake    960 ml  Output   1850 ml  Net   -890 ml    Telemetry: NSR  Exam:  General: NAD  Resp: CTAB  Cardiac: RRR, no m/r/g, no JVD  XJ:DBZMCEY soft, NT, ND  MSK:no LE edema  Neuro: no focal deficits   Lab Results:  Basic Metabolic Panel:  Recent Labs Lab 05/23/14 1806 05/25/14 0335  NA 141 139  K 3.8 4.0  CL 106 104  CO2 29 27  GLUCOSE 95 101*  BUN 10 <5*  CREATININE 0.68 0.69  CALCIUM 9.1 8.9    Liver Function Tests:  Recent Labs Lab 05/23/14 1806  AST 56*  ALT 33  ALKPHOS 62  BILITOT 0.9  PROT 7.1  ALBUMIN 3.5    CBC:  Recent Labs Lab 05/23/14 1806 05/25/14 0335  WBC 8.2 8.5  HGB 15.2 14.7  HCT 44.1 43.8  MCV 91.1 93.2  PLT 241 246    Cardiac Enzymes:  Recent Labs Lab 05/23/14 1806 05/23/14 2145 05/24/14 0425  TROPONINI 3.48* 3.60* 3.82*    BNP: No results for input(s): PROBNP in the last 8760 hours.  Coagulation:  Recent Labs Lab 05/24/14 0425  INR 1.04    ECG:   Medications:   Scheduled Medications: . aspirin EC  81 mg Oral Daily  . atorvastatin  40 mg Oral q1800  . emtricitabine-tenofovir  1 tablet Oral Daily  . lisinopril  5 mg Oral Daily  . metoprolol tartrate  25 mg Oral BID  . sodium chloride  3 mL Intravenous Q12H  . ticagrelor  90 mg Oral  BID     Infusions:     PRN Medications:  sodium chloride, acetaminophen, ALPRAZolam, nitroGLYCERIN, ondansetron (ZOFRAN) IV, sodium chloride     Assessment/Plan     39 yo male hx os neurosyphyllis admitted with chest pain and abnormal EKG.  1. Chest pain - cath 05/24/14 with severe prox LAD, mild RCA disease. LVEF 55% by LV gram. S/p DES to LAD.  - 05/23/14 echo: LVEF 45-50%, anteroseptal hypokinesis - he is on asa 81, atorva 40, lisinopril 5, lopressor 15m bid, brillinta 90 bid - post cath Hgb 14.7, Cr 0.69. Residual anterolateral ST elevations unchanged - no symptoms this AM, feels well. Will plan for discharge today       JCarlyle Dolly M.D.

## 2014-05-25 NOTE — Discharge Summary (Signed)
Discharge Summary   Patient ID: [name] [name],  MRN: [id number], DOB/AGE: 1975-05-20 39 y.o.  Admit date: 05/23/2014 Discharge date: 05/25/2014  Primary Care Provider: Wyatt Haste Primary Cardiologist: Octavia Bruckner, MD  Discharge Diagnoses Principal Problem:   Anterior myocardial infarction-subacute  **S/P successful PCI/DES to the LAD this admission.  Active Problems:   CAD (coronary artery disease), native coronary artery   Tobacco abuse   Hypertension   Hyperlipidemia   Anxiety   Depression   Neurosyphilis  Allergies Allergies  Allergen Reactions  . Gadolinium Derivatives Nausea Only    MRI dye    Procedures  2D Echocardiogram 3.17.2016  Study Conclusions  - Left ventricle: The cavity size was normal. Systolic function was   mildly reduced. The estimated ejection fraction was in the range   of 45% to 50%. Severe hypokinesis of the midanteroseptal and   anterior myocardium. _____________   Cardiac Catheterization and Percutaneous Coronary Intervention 3.18.2016  HEMODYNAMICS:  Aortic postcontrast 110/71, LV postcontrast 110/716  There was no gradient between the left ventricle and aorta.    ANGIOGRAPHIC DATA:    CORONARY ARTERIES:   Arise and distribute normally.  Right dominant. No coronary calcification is noted.  Left main coronary artery: Normal  Left anterior descending: Segmental narrowing proximally estimated at 40%.  After the septal perforator and the first diagonal is a severe segmental 99% stenosis with TIMI 2 flow distally.  There is collateral filling of this vessel seen coming from the right coronary artery.     **The LAD was successfully stented using a 3.0 x 28 mm Synergy drug-eluting stent.**  Circumflex coronary artery: Normal. Right coronary artery: Proximal 30% and distal 30% stenosis.  LEFT VENTRICULOGRAM:  Performed in the 30 RAO projection.  The aortic and mitral valves are normal. The left ventricle is normal in size with  severe hypokinesis of the distal inferior wall apex and distal anterior wall. Estimated ejection fraction is 55% _____________   History of Present Illness  39 y/o male without prior cardiac history.  He was recently diagnosed with neurosyphilis and treated with ceftriaxone via a PICC line while hospitalized with recommendation for lumbar puncture, which he refused.  Following 2 weeks of treatment, his PICC line was removed and he followed-up with his PCP on 3/2 and c/o intermittent chest discomfort.  ECG was abnormal and cardiology follow-up was arranged.  He saw Dr. Wynonia Lawman on 05/23/2014, at which time he reported an episode of more severe chest pain occurring one week prior, that was associated with dyspnea and diaphoresis.  ECG in the office suggested acute anterior infarction, though he was completely asymptomatic.  He had significant Q waves and persistent ST elevation across the precordial leads.  Decision was made to admit him to the hospital for further evaluation.  Hospital Course  Following admission, Mr. Pagnotta remained pain free however his troponin was found to be elevated at 3.48 and eventually rose to 3.82.  Trend was relatively flat and it was felt that this most likely represented a subacute infarction as opposed to an acute event.  Echocardiogram revealed and EF of 45-50% with severe midanteroseptal and anterior hypokinesis.  Decision was made to pursue diagnostic catheterization, which took place on 3/18, and revealed severe LAD disease and otherwise nonobstructive CAD.  Films were reviewed and he subsequently underwent successful PCI and drug-eluting stent placement within the LAD, using a 3.0 x 28 mm Synergy DES.  Patient tolerated procedure well and post-procedure, he has been ambulating without recurrent symptoms  or limitations.  He has been maintained on ASA, statin, bb, acei, and Brilinta therapy and will be discharged home today in good condition with a plan for early outpatient  follow-up next week with Dr. Wynonia Lawman.  Discharge Vitals Blood pressure 122/89, pulse 87, temperature 99.1 F (37.3 C), temperature source Oral, resp. rate 14, height 5' 4"  (1.626 m), weight 149 lb 4 oz (67.7 kg), SpO2 98 %.  Filed Weights   05/23/14 1652 05/25/14 0400  Weight: 144 lb (65.318 kg) 149 lb 4 oz (67.7 kg)   Labs  CBC  Recent Labs  05/23/14 1806 05/25/14 0335  WBC 8.2 8.5  NEUTROABS 4.5  --   HGB 15.2 14.7  HCT 44.1 43.8  MCV 91.1 93.2  PLT 241 390   Basic Metabolic Panel  Recent Labs  05/23/14 1806 05/25/14 0335  NA 141 139  K 3.8 4.0  CL 106 104  CO2 29 27  GLUCOSE 95 101*  BUN 10 <5*  CREATININE 0.68 0.69  CALCIUM 9.1 8.9   Liver Function Tests  Recent Labs  05/23/14 1806  AST 56*  ALT 33  ALKPHOS 62  BILITOT 0.9  PROT 7.1  ALBUMIN 3.5   Cardiac Enzymes  Recent Labs  05/23/14 1806 05/23/14 2145 05/24/14 0425  TROPONINI 3.48* 3.60* 3.82*   Fasting Lipid Panel  Recent Labs  05/23/14 1806  CHOL 139  HDL 46  LDLCALC 79  TRIG 69  CHOLHDL 3.0   Thyroid Function Tests  Recent Labs  05/23/14 1806  TSH 1.296   Disposition  Pt is being discharged home today in good condition.  Follow-up Plans & Appointments  Follow-up Information    Follow up with TILLEY JR,W SPENCER, MD. Schedule an appointment as soon as possible for a visit in 1 week.   Specialty:  Cardiology   Contact information:   [address] [place] [address] [phone]       Follow up with Wyatt Haste, MD.   Specialty:  Family Medicine   Why:  as scheduled.   Contact information:   [address] [place] [address] [phone]      Discharge Medications    Medication List    STOP taking these medications        ibuprofen 200 MG tablet  Commonly known as:  ADVIL,MOTRIN      TAKE these medications        ALPRAZolam 0.5 MG tablet  Commonly known as:  XANAX  Take 1 tablet (0.5 mg total) by mouth 2  (two) times daily as needed for anxiety.     aspirin 81 MG EC tablet  Take 1 tablet (81 mg total) by mouth daily.     atorvastatin 40 MG tablet  Commonly known as:  LIPITOR  Take 1 tablet (40 mg total) by mouth daily at 6 PM.     emtricitabine-tenofovir 200-300 MG per tablet  Commonly known as:  TRUVADA  Take 1 tablet by mouth daily.     lisinopril 5 MG tablet  Commonly known as:  PRINIVIL,ZESTRIL  Take 1 tablet (5 mg total) by mouth daily.     metoprolol tartrate 25 MG tablet  Commonly known as:  LOPRESSOR  Take 1 tablet (25 mg total) by mouth 2 (two) times daily.     multivitamin with minerals Tabs tablet  Take 1 tablet by mouth daily.     nitroGLYCERIN 0.4 MG SL tablet  Commonly known as:  NITROSTAT  Place 1 tablet (  0.4 mg total) under the tongue every 5 (five) minutes x 3 doses as needed for chest pain.     ticagrelor 90 MG Tabs tablet  Commonly known as:  BRILINTA  Take 1 tablet (90 mg total) by mouth 2 (two) times daily.     VISINE OP  Place 1 drop into both eyes daily as needed (red eyes).       Outstanding Labs/Studies  Follow-up lipids/lft's in 6-8 wks.  Duration of Discharge Encounter   Greater than 30 minutes including physician time.  Signed, Murray Hodgkins NP 05/25/2014, 8:11 AM

## 2014-05-25 NOTE — Discharge Instructions (Signed)
**  PLEASE REMEMBER TO BRING ALL OF YOUR MEDICATIONS TO EACH OF YOUR FOLLOW-UP OFFICE VISITS.  NO HEAVY LIFTING X 4 WEEKS. NO SEXUAL ACTIVITY X 4 WEEKS. NO DRIVING X 2 WEEKS. NO SOAKING BATHS, HOT TUBS, POOLS, ETC., X 7 DAYS.  Groin Site Care Refer to this sheet in the next few weeks. These instructions provide you with information on caring for yourself after your procedure. Your caregiver may also give you more specific instructions. Your treatment has been planned according to current medical practices, but problems sometimes occur. Call your caregiver if you have any problems or questions after your procedure. HOME CARE INSTRUCTIONS  You may shower 24 hours after the procedure. Remove the bandage (dressing) and gently wash the site with plain soap and water. Gently pat the site dry.   Do not apply powder or lotion to the site.   Do not sit in a bathtub, swimming pool, or whirlpool for 5 to 7 days.   No bending, squatting, or lifting anything over 10 pounds (4.5 kg) as directed by your caregiver.   Inspect the site at least twice daily.   Do not drive home if you are discharged the same day of the procedure. Have someone else drive you.   What to expect:  Any bruising will usually fade within 1 to 2 weeks.   Blood that collects in the tissue (hematoma) may be painful to the touch. It should usually decrease in size and tenderness within 1 to 2 weeks.  SEEK IMMEDIATE MEDICAL CARE IF:  You have unusual pain at the groin site or down the affected leg.   You have redness, warmth, swelling, or pain at the groin site.   You have drainage (other than a small amount of blood on the dressing).   You have chills.   You have a fever or persistent symptoms for more than 72 hours.   You have a fever and your symptoms suddenly get worse.   Your leg becomes pale, cool, tingly, or numb.  You have heavy bleeding from the site. Hold pressure on the site. Marland Kitchen

## 2014-05-27 MED FILL — Sodium Chloride IV Soln 0.9%: INTRAVENOUS | Qty: 50 | Status: AC

## 2014-06-05 ENCOUNTER — Encounter: Payer: Self-pay | Admitting: Cardiology

## 2014-06-05 NOTE — Progress Notes (Signed)
Patient ID: [name] [name], male   DOB: Aug 04, 1975, 39 y.o.   MRN: [id number]   Brandt, Chaney    Date of visit:  06/05/2014 DOB:  March 04, 1976    Age:  39 yrs. Medical record number:  [id number]     Account number:  [id number] Primary Care Provider: Robyne Askew ____________________________ CURRENT DIAGNOSES  1. Atherosclerotic heart disease of native coronary artery without angina pectoris  2. Old myocardial infarction  3. Hyperlipidemia  4. Essential hypertension  5. Dizziness and giddiness ____________________________ ALLERGIES  IVP Dye, Iodine Containing, Vomiting ____________________________ MEDICATIONS  1. Truvada 200 mg-300 mg tablet, 1 p.o. daily  2. alprazolam 0.5 mg tablet, PRN  3. atorvastatin 40 mg tablet, 1 p.o. daily  4. metoprolol tartrate 25 mg tablet, BID  5. nitroglycerin 0.4 mg sublingual tablet, PRN  6. aspirin 81 mg chewable tablet, 1 p.o. daily  7. lisinopril 5 mg tablet, 1 p.o. daily  8. BRILINTA 90 mg tablet, BID ____________________________ CHIEF COMPLAINTS  Followup of Chest pain ____________________________ HISTORY OF PRESENT ILLNESS Patient seen for cardiac followup. He was admitted to the hospital with a suspected recent anterior infarction. His troponins were mildly elevated but were flat suggesting that the infarction occurred a few weeks ago. At catheterization he was found to have a subtotal stenosis in the mid LAD just after the diagonal branch and a mild stenosis involving the right coronary artery. He received a stent (drug-eluting) in the proximal LAD by Dr.Varanasi. He went home the next day and has felt fairly good since then but has unfortunately resumed smoking. He has been treated with Brilinta , ACE inhibitors, and beta blockers as well as statin therapy. He has had no recurrence of angina. He asks about some recent emesis that he had and wonders whether he has carotid artery blockage. A review of his old records showed some slight fluid  collection in his left mastoid area. ____________________________ PAST HISTORY  Past Medical Illnesses:  neurosyphilis, anxiety, hypertension, erectile dysfunction, hyperlipidemia;  Cardiovascular Illnesses:  CAD, S/P MI-anterior;  Surgical Procedures:  pilonidal cyst removal;  NYHA Classification:  I;  Canadian Angina Classification:  Class 0: Asymptomatic;  Cardiology Procedures-Invasive:  cardiac cath (left) March 2016, DES stent with 3.0 x28 mm stent 05/24/14 Dr. Irish Lack;  Cardiology Procedures-Noninvasive:  echocardiogram March 2016;  Cardiac Cath Results:  normal Left main, 99% stenosis mid LAD, normal CFX, 20 % stenosis distal RCA, stent paced in LAD,  distal anterior and apical hypokinesis;  LVEF of 45% documented via echocardiogram on 05/23/2014,   ____________________________ CARDIO-PULMONARY TEST DATES EKG Date:  05/23/2014;   Cardiac Cath Date:  05/24/2014;  Echocardiography Date: 05/23/2014;  Chest Xray Date: 05/13/2014;   ____________________________ FAMILY HISTORY Brother -- Brother alive and well Brother -- Brother alive and well Father -- Father alive with problem, Heart disease, Diabetes mellitus Mother -- Mother alive with problem, Thyroid disease Sister -- Sister alive and well Sister -- Sister alive and well ____________________________ SOCIAL HISTORY Alcohol Use:  3-5 drinks qd;  Smoking:  smokes, 25 pack year history;  Diet:  regular diet;  Lifestyle:  single;  Exercise:  gym;  Occupation:  Scientist, research (medical) and [employer];  Residence:  lives with male partner;   ____________________________ PHYSICAL EXAMINATION VITAL SIGNS  Blood Pressure:  124/70 Sitting, Right arm, regular cuff  , 132/70 Standing, Right arm and regular cuff   Pulse:  80/min. Weight:  150.00 lbs. Height:  64"BMI: 26  Constitutional:  pleasant white male in no acute distress  Skin:  multiple tattoos Head:  normocephalic, normal hair pattern, no masses or tenderness Chest:  normal symmetry, clear to  auscultation. Cardiac:  regular rhythm, normal S1 and S2, No S3 or S4, no murmurs, gallops or rubs detected. Peripheral Pulses:  the femoral,dorsalis pedis, and posterior tibial pulses are full and equal bilaterally with no bruits auscultated. Extremities & Back:  cath site clean and dry Neurological:  no gross motor or sensory deficits noted, affect appropriate, oriented x3. ____________________________ MOST RECENT LIPID PANEL 05/08/14  CHOL TOTL 139 mg/dl, LDL 82 NM, HDL 34 mg/dl, TRIGLYCER 117 mg/dl and CHOL/HDL 4.1 (Calc) ____________________________ IMPRESSIONS/PLAN   1. Recent anterior infarction-he did have some collaterals to this area and the plan is to get a repeat echo in 3 months to see if he has had any recovery in this area. He had significant hypokinesis of the distal anteroseptal and apical area and distal anterior wall. He does have persistent ST elevation on his EKG suggesting that he may not have total recovery in this area. 2. Coronary artery disease with previous LAD disease and mild disease beginning in right coronary artery 3. Hypertension 4. Hyperlipidemia 5. Tobacco abuse again advised to discontinue smoking  Recommendations:  Importance of tobacco cessation again discussed with patient. This is especially critical since he has disease also starting in the right coronary artery. Plan a treadmill test and then to get involved in cardiac rehabilitation. Obtain carotid Doppler study because of the tinnitus and vague dizziness. He'll need to followup the fluid collection in the mastoid air cells with his primary doctor. ____________________________ TODAYS ORDERS  1. treadmill:  Regular TM 2 weeks  2. Carotid Duplex: At Patient Convenience                       ____________________________ Cardiology Physician:  Kerry Hough MD [facility]

## 2014-06-06 ENCOUNTER — Telehealth: Payer: Self-pay | Admitting: Family Medicine

## 2014-06-06 NOTE — Telephone Encounter (Signed)
Pt's hospital follow up appt was cancelled via the automated system. Pt was called and a message was left asking pt to call back if thei appt was cancelled in error.

## 2014-06-07 ENCOUNTER — Inpatient Hospital Stay: Payer: 59 | Admitting: Family Medicine

## 2014-07-04 ENCOUNTER — Telehealth: Payer: Self-pay

## 2014-07-04 NOTE — Telephone Encounter (Signed)
Prior auth sent to [facility] for Brilinta 90 mg bid, via Cover My Meds.

## 2014-07-06 ENCOUNTER — Emergency Department (HOSPITAL_COMMUNITY): Payer: 59

## 2014-07-06 ENCOUNTER — Encounter (HOSPITAL_COMMUNITY): Payer: Self-pay | Admitting: Emergency Medicine

## 2014-07-06 ENCOUNTER — Emergency Department (HOSPITAL_COMMUNITY)
Admission: EM | Admit: 2014-07-06 | Discharge: 2014-07-07 | Disposition: A | Discharge status: Home / Self Care | Payer: 59 | Attending: Emergency Medicine | Admitting: Emergency Medicine

## 2014-07-06 DIAGNOSIS — Z79899 Other long term (current) drug therapy: Secondary | ICD-10-CM | POA: Diagnosis not present

## 2014-07-06 DIAGNOSIS — Y998 Other external cause status: Secondary | ICD-10-CM | POA: Diagnosis not present

## 2014-07-06 DIAGNOSIS — Y9289 Other specified places as the place of occurrence of the external cause: Secondary | ICD-10-CM | POA: Insufficient documentation

## 2014-07-06 DIAGNOSIS — Y9389 Activity, other specified: Secondary | ICD-10-CM | POA: Insufficient documentation

## 2014-07-06 DIAGNOSIS — Z7982 Long term (current) use of aspirin: Secondary | ICD-10-CM | POA: Insufficient documentation

## 2014-07-06 DIAGNOSIS — E785 Hyperlipidemia, unspecified: Secondary | ICD-10-CM | POA: Insufficient documentation

## 2014-07-06 DIAGNOSIS — I252 Old myocardial infarction: Secondary | ICD-10-CM | POA: Insufficient documentation

## 2014-07-06 DIAGNOSIS — X58XXXA Exposure to other specified factors, initial encounter: Secondary | ICD-10-CM | POA: Diagnosis not present

## 2014-07-06 DIAGNOSIS — Z7902 Long term (current) use of antithrombotics/antiplatelets: Secondary | ICD-10-CM | POA: Diagnosis not present

## 2014-07-06 DIAGNOSIS — Z9861 Coronary angioplasty status: Secondary | ICD-10-CM | POA: Insufficient documentation

## 2014-07-06 DIAGNOSIS — I10 Essential (primary) hypertension: Secondary | ICD-10-CM | POA: Insufficient documentation

## 2014-07-06 DIAGNOSIS — S0181XA Laceration without foreign body of other part of head, initial encounter: Secondary | ICD-10-CM | POA: Diagnosis present

## 2014-07-06 DIAGNOSIS — Z87438 Personal history of other diseases of male genital organs: Secondary | ICD-10-CM | POA: Diagnosis not present

## 2014-07-06 DIAGNOSIS — S0101XA Laceration without foreign body of scalp, initial encounter: Secondary | ICD-10-CM | POA: Insufficient documentation

## 2014-07-06 DIAGNOSIS — I2510 Atherosclerotic heart disease of native coronary artery without angina pectoris: Secondary | ICD-10-CM | POA: Diagnosis not present

## 2014-07-06 DIAGNOSIS — F329 Major depressive disorder, single episode, unspecified: Secondary | ICD-10-CM | POA: Insufficient documentation

## 2014-07-06 DIAGNOSIS — Z720 Tobacco use: Secondary | ICD-10-CM | POA: Diagnosis not present

## 2014-07-06 DIAGNOSIS — F419 Anxiety disorder, unspecified: Secondary | ICD-10-CM | POA: Diagnosis not present

## 2014-07-06 HISTORY — DX: Acute myocardial infarction, unspecified: I21.9

## 2014-07-06 LAB — COMPREHENSIVE METABOLIC PANEL
ALT: 50 U/L (ref 0–53)
AST: 51 U/L — AB (ref 0–37)
Albumin: 4.2 g/dL (ref 3.5–5.2)
Alkaline Phosphatase: 97 U/L (ref 39–117)
Anion gap: 13 (ref 5–15)
BUN: <5 mg/dL — ABNORMAL LOW (ref 6–23)
CALCIUM: 8.9 mg/dL (ref 8.4–10.5)
CHLORIDE: 106 mmol/L (ref 96–112)
CO2: 23 mmol/L (ref 19–32)
CREATININE: 0.68 mg/dL (ref 0.50–1.35)
GFR calc Af Amer: >90 mL/min (ref >90)
GFR calc non Af Amer: >90 mL/min (ref >90)
Glucose, Bld: 151 mg/dL — ABNORMAL HIGH (ref 70–99)
POTASSIUM: 3.7 mmol/L (ref 3.5–5.1)
Sodium: 142 mmol/L (ref 135–145)
Total Bilirubin: 0.6 mg/dL (ref 0.3–1.2)
Total Protein: 7.8 g/dL (ref 6.0–8.3)

## 2014-07-06 LAB — CBC WITH DIFFERENTIAL/PLATELET
Basophils Absolute: 0 10*3/uL (ref 0.0–0.1)
Basophils Relative: 0 % (ref 0–1)
EOS ABS: 0.1 10*3/uL (ref 0.0–0.7)
Eosinophils Relative: 1 % (ref 0–5)
HCT: 47.5 % (ref 39.0–52.0)
Hemoglobin: 16.4 g/dL (ref 13.0–17.0)
Lymphocytes Relative: 52 % — ABNORMAL HIGH (ref 12–46)
Lymphs Abs: 4.4 10*3/uL — ABNORMAL HIGH (ref 0.7–4.0)
MCH: 31.8 pg (ref 26.0–34.0)
MCHC: 34.5 g/dL (ref 30.0–36.0)
MCV: 92.1 fL (ref 78.0–100.0)
Monocytes Absolute: 0.4 10*3/uL (ref 0.1–1.0)
Monocytes Relative: 4 % (ref 3–12)
Neutro Abs: 3.6 10*3/uL (ref 1.7–7.7)
Neutrophils Relative %: 43 % (ref 43–77)
PLATELETS: 229 10*3/uL (ref 150–400)
RBC: 5.16 MIL/uL (ref 4.22–5.81)
RDW: 14.8 % (ref 11.5–15.5)
WBC: 8.5 10*3/uL (ref 4.0–10.5)

## 2014-07-06 LAB — RAPID URINE DRUG SCREEN, HOSP PERFORMED
Amphetamines: NOT DETECTED
BARBITURATES: NOT DETECTED
Benzodiazepines: NOT DETECTED
Cocaine: NOT DETECTED
OPIATES: NOT DETECTED
Tetrahydrocannabinol: NOT DETECTED

## 2014-07-06 LAB — I-STAT TROPONIN, ED: TROPONIN I, POC: 0.01 ng/mL (ref 0.00–0.08)

## 2014-07-06 LAB — TYPE AND SCREEN
ABO/RH(D): O POS
Antibody Screen: NEGATIVE

## 2014-07-06 MED ORDER — SODIUM CHLORIDE 0.9 % IV BOLUS (SEPSIS)
1000.0000 mL | Freq: Once | INTRAVENOUS | Status: AC
  Administered 2014-07-06: 1000 mL via INTRAVENOUS

## 2014-07-06 NOTE — ED Provider Notes (Signed)
CSN: [id number]     Arrival date & time 07/06/14  2104 History   First MD Initiated Contact with Patient 07/06/14 2129     Chief Complaint  Patient presents with  . Code STEMI     (Consider location/radiation/quality/duration/timing/severity/associated sxs/prior Treatment) HPI  This is a 39 year old male, with a history of hypertension, neurosyphilis, CAD status post stent placement last month, presenting today with laceration to the head. He drank all day yesterday, went to sleep last night, woke up with a laceration to the head. He continued to drink alcohol all day today, ultimately his friend called the paramedics. He has mild discomfort associated with this laceration to the left temporal area of his head. He describes it as sharp. Alleviated with large amount of vodka consumption. Nonradiating. Negative for focal weakness, numbness, tingling. He is supposed to be on aspirin, but has been noncompliant with this. He has no chest pain or shortness of breath. He was initially called in as a code STEMI.  Past Medical History  Diagnosis Date  . 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    Marland Kitchen Hypertension     medication in 2012, stress as a big component  . Hyperlipidemia   . Anxiety   . Depression   . Erectile dysfunction   . Coronary artery disease     a. 05/2014 OOH MI/PCI: LM nl, LAD 40p, 99p (3.0x28 Synergy DES) w/ R->L Collaterals, LCX nl, RCA 30p/d, EF 55% w/ sev distal inf/ant HK.  . Tobacco abuse   . H/O echocardiogram     a. 05/2014 Echo: Ef 45-50%, sev mid antsept/ant HK.  Marland Kitchen CAD (coronary artery disease), native coronary artery 05/24/2014    Anterior infarction occurring out of Hospital approximately week prior to admission 3/17 16  Cardiac catheterization 3/18 showing a proximal 40% segmental LAD, 99% mid LAD with collaterals from the right coronary artery, normal circumflex, 20-30% mid and distal right coronary artery stenosis.  3.0 x 28 mm Synergy stent by Dr.  Irish Lack post dilated to 3.25 mm (incidental note that LAD was closed at the time of the PCI a few minutes after the catheterization but with no symptoms)  Echo shows distal anterior apical and distal inferior severe hypokinesis with EF around 50%   . MI (myocardial infarction)    Past Surgical History  Procedure Laterality Date  . Pilonidal cyst excision  1995  . Coronary stent placement  05/24/2014    MID LAD DES   . Left heart catheterization with coronary angiogram N/A 05/24/2014    Procedure: LEFT HEART CATHETERIZATION WITH CORONARY ANGIOGRAM;  Surgeon: Jettie Booze, MD;  Location: [facility] CATH LAB;  Service: Cardiovascular;  Laterality: N/A;  . Percutaneous coronary stent intervention (pci-s)  05/24/2014    Procedure: PERCUTANEOUS CORONARY STENT INTERVENTION (PCI-S);  Surgeon: Jettie Booze, MD;  Location: [facility] CATH LAB;  Service: Cardiovascular;;   Family History  Problem Relation Age of Onset  . Thyroid disease Mother   . Heart disease Father 85    MI, stents  . Peripheral Artery Disease Father   . Diabetes type II Father   . Diabetes Father   . Diabetes Maternal Grandmother   . Hypertension Maternal Grandfather   . Hypertension Paternal Grandfather   . Cancer Neg Hx   . Stroke Neg Hx    History  Substance Use Topics  . Smoking status: Current Every Day Smoker -- 1.00 packs/day for 23 years    Types: Cigarettes  . Smokeless  tobacco: Never Used  . Alcohol Use: 14.4 oz/week    24 Cans of beer per week     Comment: "3-5 times/wk I'll have 3-5 beers; max of 24 beers/wk"    Review of Systems  Constitutional: Negative for fever and chills.  HENT: Negative for facial swelling.   Eyes: Negative for pain and visual disturbance.  Respiratory: Negative for chest tightness and shortness of breath.   Cardiovascular: Negative for chest pain.  Gastrointestinal: Negative for nausea and vomiting.  Genitourinary: Negative for dysuria.  Musculoskeletal: Negative for myalgias and  arthralgias.  Skin: Positive for wound.  Neurological: Negative for headaches.  Psychiatric/Behavioral: Negative for behavioral problems.      Allergies  Gadolinium derivatives  Home Medications   Prior to Admission medications   Medication Sig Start Date End Date Taking? Authorizing Provider  ALPRAZolam Duanne Moron) 0.5 MG tablet Take 1 tablet (0.5 mg total) by mouth 2 (two) times daily as needed for anxiety. 05/08/14   Carlena Hurl, PA-C  aspirin EC 81 MG EC tablet Take 1 tablet (81 mg total) by mouth daily. 05/25/14   Rogelia Mire, NP  atorvastatin (LIPITOR) 40 MG tablet Take 1 tablet (40 mg total) by mouth daily at 6 PM. 05/25/14   Rogelia Mire, NP  emtricitabine-tenofovir (TRUVADA) 200-300 MG per tablet Take 1 tablet by mouth daily. 05/09/14   Thayer Headings, MD  lisinopril (PRINIVIL,ZESTRIL) 5 MG tablet Take 1 tablet (5 mg total) by mouth daily. 05/25/14   Rogelia Mire, NP  metoprolol tartrate (LOPRESSOR) 25 MG tablet Take 1 tablet (25 mg total) by mouth 2 (two) times daily. 05/25/14   Rogelia Mire, NP  Multiple Vitamin (MULTIVITAMIN WITH MINERALS) TABS tablet Take 1 tablet by mouth daily.    Historical Provider, MD  nitroGLYCERIN (NITROSTAT) 0.4 MG SL tablet Place 1 tablet (0.4 mg total) under the tongue every 5 (five) minutes x 3 doses as needed for chest pain. 05/25/14   Rogelia Mire, NP  Tetrahydrozoline HCl (VISINE OP) Place 1 drop into both eyes daily as needed (red eyes).    Historical Provider, MD  ticagrelor (BRILINTA) 90 MG TABS tablet Take 1 tablet (90 mg total) by mouth 2 (two) times daily. 05/25/14   Rogelia Mire, NP   BP 137/93 mmHg  Pulse 100  Temp(Src) 98.2 F (36.8 C) (Oral)  Resp 11  Ht 5' 4"  (1.626 m)  Wt 150 lb (68.04 kg)  BMI 25.73 kg/m2  SpO2 100% Physical Exam  Constitutional: He is oriented to person, place, and time. He appears well-developed and well-nourished. No distress.  HENT:  Head: Normocephalic and atraumatic.   Mouth/Throat: No oropharyngeal exudate.  Eyes: Conjunctivae are normal. Pupils are equal, round, and reactive to light. No scleral icterus.  Neck: Normal range of motion. No tracheal deviation present. No thyromegaly present.  Cardiovascular: Normal rate, regular rhythm and normal heart sounds.  Exam reveals no gallop and no friction rub.   No murmur heard. Pulmonary/Chest: Effort normal and breath sounds normal. No stridor. No respiratory distress. He has no wheezes. He has no rales. He exhibits no tenderness.  Abdominal: Soft. He exhibits no distension and no mass. There is no tenderness. There is no rebound and no guarding.  Musculoskeletal: Normal range of motion. He exhibits no edema.  Neurological: He is alert and oriented to person, place, and time. He has normal strength. No cranial nerve deficit or sensory deficit. Coordination and gait normal. GCS eye subscore is 4.  GCS verbal subscore is 5. GCS motor subscore is 6.  Reflex Scores:      Patellar reflexes are 2+ on the right side and 2+ on the left side. Skin: He is not diaphoretic.  3 sonometer laceration to the left temporal scalp, grossly hemostatic    ED Course  LACERATION REPAIR Date/Time: 07/06/2014 10:00 PM Performed by: Doy Hutching Authorized by: Doy Hutching Consent: Verbal consent obtained. Risks and benefits: risks, benefits and alternatives were discussed Consent given by: patient Patient understanding: patient states understanding of the procedure being performed Patient consent: the patient's understanding of the procedure matches consent given Procedure consent: procedure consent matches procedure scheduled Relevant documents: relevant documents present and verified Test results: test results available and properly labeled Imaging studies: imaging studies available Required items: required blood products, implants, devices, and special equipment available Time out: Immediately prior to procedure a "time  out" was called to verify the correct patient, procedure, equipment, support staff and site/side marked as required. Body area: head/neck Location details: scalp Laceration length: 3 cm Foreign bodies: no foreign bodies Tendon involvement: none Nerve involvement: none Vascular damage: no Patient sedated: no Preparation: Patient was prepped and draped in the usual sterile fashion. Irrigation solution: saline Irrigation method: syringe Amount of cleaning: extensive Debridement: none Degree of undermining: none Skin closure: staples Number of sutures: 3 Patient tolerance: Patient tolerated the procedure well with no immediate complications   (including critical care time) Labs Review Labs Reviewed  CBC WITH DIFFERENTIAL/PLATELET - Abnormal; Notable for the following:    Lymphocytes Relative 52 (*)    Lymphs Abs 4.4 (*)    All other components within normal limits  COMPREHENSIVE METABOLIC PANEL - Abnormal; Notable for the following:    Glucose, Bld 151 (*)    BUN <5 (*)    AST 51 (*)    All other components within normal limits  ETHANOL  URINE RAPID DRUG SCREEN (HOSP PERFORMED)  I-STAT TROPOININ, ED  I-STAT TROPOININ, ED  TYPE AND SCREEN  ABO/RH    Imaging Review Ct Head Wo Contrast  07/06/2014   CLINICAL DATA:  code STEMI and a head injury, pt is been drinking Vodka since yesterday and doesn't remember how he got hurt on his head. Unable to tell if he LOC  EXAM: CT HEAD WITHOUT CONTRAST  CT CERVICAL SPINE WITHOUT CONTRAST  TECHNIQUE: Multidetector CT imaging of the head and cervical spine was performed following the standard protocol without intravenous contrast. Multiplanar CT image reconstructions of the cervical spine were also generated.  COMPARISON:  04/19/14  FINDINGS: CT HEAD FINDINGS  No mass lesion. No midline shift. No acute hemorrhage or hematoma. No extra-axial fluid collections. No evidence of acute infarction.Left scalp laceration. No skull fracture.  CT CERVICAL  SPINE FINDINGS  Bilateral carotid artery calcification. No other soft tissue abnormalities. Lung apices clear. No cervical spine fracture. Normal alignment. Mild C6-7 degenerative disc disease.  IMPRESSION: No acute abnormalities.   Electronically Signed   By: Skipper Cliche M.D.   On: 07/06/2014 22:02   Ct Cervical Spine Wo Contrast  07/06/2014   CLINICAL DATA:  code STEMI and a head injury, pt is been drinking Vodka since yesterday and doesn't remember how he got hurt on his head. Unable to tell if he LOC  EXAM: CT HEAD WITHOUT CONTRAST  CT CERVICAL SPINE WITHOUT CONTRAST  TECHNIQUE: Multidetector CT imaging of the head and cervical spine was performed following the standard protocol without intravenous contrast. Multiplanar CT image reconstructions of  the cervical spine were also generated.  COMPARISON:  04/19/14  FINDINGS: CT HEAD FINDINGS  No mass lesion. No midline shift. No acute hemorrhage or hematoma. No extra-axial fluid collections. No evidence of acute infarction.Left scalp laceration. No skull fracture.  CT CERVICAL SPINE FINDINGS  Bilateral carotid artery calcification. No other soft tissue abnormalities. Lung apices clear. No cervical spine fracture. Normal alignment. Mild C6-7 degenerative disc disease.  IMPRESSION: No acute abnormalities.   Electronically Signed   By: Skipper Cliche M.D.   On: 07/06/2014 22:02     EKG Interpretation   Date/Time:  Saturday July 06 2014 21:09:48 EDT Ventricular Rate:  121 PR Interval:  124 QRS Duration: 85 QT Interval:  309 QTC Calculation: 438 R Axis:   -83 Text Interpretation:  Sinus tachycardia LAD, consider left anterior  fascicular block Probable anterior infarct, age indeterminate ST elevation  V2- V4 improved since previous Confirmed by YAO  MD, Jevante ([id number]) on  07/06/2014 10:22:10 PM      MDM   Final diagnoses:  Scalp laceration, initial encounter    This is a 39 year old male, with a history of hypertension, neurosyphilis, CAD  status post stent placement last month, presenting today with laceration to the head. He drank all day yesterday, went to sleep last night, woke up with a laceration to the head. He continued to drink alcohol all day today, ultimately his friend called the paramedics. He has mild discomfort associated with this laceration to the left temporal area of his head. He describes it as sharp. Alleviated with large amount of vodka consumption. Nonradiating. Negative for focal weakness, numbness, tingling. He is supposed to be on aspirin, but has been noncompliant with this. He has no chest pain or shortness of breath. He was initially called in as a code STEMI.  Initially, on presentation, patient was obviously intoxicated. Airway intact. Breath sounds equal bilaterally. Patient hemato-ankle stable, good IV access obtained. Negative for cardiovascular or respiratory abnormalities on my exam. Secondary examination is within normal limits, with the exception of the recent meter laceration to the left temporal area.  Pertaining to EKG transmitted from paramedics, patient has anterior ST elevation, but this is in fact greatly improved in comparison to past EKG. The patient has absolutely no chest pain or shortness of breath or lightheadedness, dizziness, nausea, or diaphoresis. As above, laceration was repaired with staples.  Troponin is within normal limits, as is the rest of his labs. CT scan of the head, C-spine are within normal limits. Cervical spine collar has been cleared. Patient speaks without slurred speech, is alert, oriented, ambulates without ataxia. If second troponin is within normal limits, we'll discharge with instructions to follow-up for wound care in 5-7 days.  Second troponin is within normal limits.  Pt stable for discharge, FU.  All questions answered.  Return precautions given.  I have discussed case and care has been guided by my attending physician, Dr. Darl Householder.  Doy Hutching, MD 07/07/14  0045  Wandra Arthurs, MD 07/08/14 Pauline Aus

## 2014-07-06 NOTE — ED Notes (Signed)
[facility] contacted to cancel Code Stemi

## 2014-07-06 NOTE — ED Notes (Signed)
Pt brought to ED by [facility] from home for code STEMI and a head injury, pt is been drinking Vodka since yesterday and doesn't remember how he got hurt on his head. Unable to tell if he LOC.

## 2014-07-07 LAB — ABO/RH: ABO/RH(D): O POS

## 2014-07-07 LAB — I-STAT TROPONIN, ED: Troponin i, poc: 0.01 ng/mL (ref 0.00–0.08)

## 2014-07-07 NOTE — Discharge Instructions (Signed)
Head Injury You have a head injury. Headaches and throwing up (vomiting) are common after a head injury. It should be easy to wake up from sleeping. Sometimes you must stay in the hospital. Most problems happen within the first 24 hours. Side effects may occur up to 7-10 days after the injury.  WHAT ARE THE TYPES OF HEAD INJURIES? Head injuries can be as minor as a bump. Some head injuries can be more severe. More severe head injuries include:  A jarring injury to the brain (concussion).  A bruise of the brain (contusion). This mean there is bleeding in the brain that can cause swelling.  A cracked skull (skull fracture).  Bleeding in the brain that collects, clots, and forms a bump (hematoma). WHEN SHOULD I GET HELP RIGHT AWAY?   You are confused or sleepy.  You cannot be woken up.  You feel sick to your stomach (nauseous) or keep throwing up (vomiting).  Your dizziness or unsteadiness is getting worse.  You have very bad, lasting headaches that are not helped by medicine. Take medicines only as told by your doctor.  You cannot use your arms or legs like normal.  You cannot walk.  You notice changes in the black spots in the center of the colored part of your eye (pupil).  You have clear or bloody fluid coming from your nose or ears.  You have trouble seeing. During the next 24 hours after the injury, you must stay with someone who can watch you. This person should get help right away (call 911 in the [place] if you start to shake and are not able to control it (have seizures), you pass out, or you are unable to wake up. HOW CAN I PREVENT A HEAD INJURY IN THE FUTURE?  Wear seat belts.  Wear a helmet while bike riding and playing sports like football.  Stay away from dangerous activities around the house. WHEN CAN I RETURN TO NORMAL ACTIVITIES AND ATHLETICS? See your doctor before doing these activities. You should not do normal activities or play contact sports until 1 week  after the following symptoms have stopped:  Headache that does not go away.  Dizziness.  Poor attention.  Confusion.  Memory problems.  Sickness to your stomach or throwing up.  Tiredness.  Fussiness.  Bothered by bright lights or loud noises.  Anxiousness or depression.  Restless sleep. MAKE SURE YOU:   Understand these instructions.  Will watch your condition.  Will get help right away if you are not doing well or get worse. Document Released: 02/05/2008 Document Revised: 07/09/2013 Document Reviewed: 10/30/2012 [facility] Patient Information 2015 [place], [place]. This information is not intended to replace advice given to you by your health care provider. Make sure you discuss any questions you have with your health care provider. Laceration Care, Adult A laceration is a cut or lesion that goes through all layers of the skin and into the tissue just beneath the skin. TREATMENT  Some lacerations may not require closure. Some lacerations may not be able to be closed due to an increased risk of infection. It is important to see your caregiver as soon as possible after an injury to minimize the risk of infection and maximize the opportunity for successful closure. If closure is appropriate, pain medicines may be given, if needed. The wound will be cleaned to help prevent infection. Your caregiver will use stitches (sutures), staples, wound glue (adhesive), or skin adhesive strips to repair the laceration. These tools bring the  skin edges together to allow for faster healing and a better cosmetic outcome. However, all wounds will heal with a scar. Once the wound has healed, scarring can be minimized by covering the wound with sunscreen during the day for 1 full year. HOME CARE INSTRUCTIONS  For sutures or staples:  Keep the wound clean and dry.  If you were given a bandage (dressing), you should change it at least once a day. Also, change the dressing if it becomes wet or dirty,  or as directed by your caregiver.  Wash the wound with soap and water 2 times a day. Rinse the wound off with water to remove all soap. Pat the wound dry with a clean towel.  After cleaning, apply a thin layer of the antibiotic ointment as recommended by your caregiver. This will help prevent infection and keep the dressing from sticking.  You may shower as usual after the first 24 hours. Do not soak the wound in water until the sutures are removed.  Only take over-the-counter or prescription medicines for pain, discomfort, or fever as directed by your caregiver.  Get your sutures or staples removed as directed by your caregiver. For skin adhesive strips:  Keep the wound clean and dry.  Do not get the skin adhesive strips wet. You may bathe carefully, using caution to keep the wound dry.  If the wound gets wet, pat it dry with a clean towel.  Skin adhesive strips will fall off on their own. You may trim the strips as the wound heals. Do not remove skin adhesive strips that are still stuck to the wound. They will fall off in time. For wound adhesive:  You may briefly wet your wound in the shower or bath. Do not soak or scrub the wound. Do not swim. Avoid periods of heavy perspiration until the skin adhesive has fallen off on its own. After showering or bathing, gently pat the wound dry with a clean towel.  Do not apply liquid medicine, cream medicine, or ointment medicine to your wound while the skin adhesive is in place. This may loosen the film before your wound is healed.  If a dressing is placed over the wound, be careful not to apply tape directly over the skin adhesive. This may cause the adhesive to be pulled off before the wound is healed.  Avoid prolonged exposure to sunlight or tanning lamps while the skin adhesive is in place. Exposure to ultraviolet light in the first year will darken the scar.  The skin adhesive will usually remain in place for 5 to 10 days, then naturally  fall off the skin. Do not pick at the adhesive film. You may need a tetanus shot if:  You cannot remember when you had your last tetanus shot.  You have never had a tetanus shot. If you get a tetanus shot, your arm may swell, get red, and feel warm to the touch. This is common and not a problem. If you need a tetanus shot and you choose not to have one, there is a rare chance of getting tetanus. Sickness from tetanus can be serious. SEEK MEDICAL CARE IF:   You have redness, swelling, or increasing pain in the wound.  You see a red line that goes away from the wound.  You have yellowish-white fluid (pus) coming from the wound.  You have a fever.  You notice a bad smell coming from the wound or dressing.  Your wound breaks open before or after sutures have  been removed.  You notice something coming out of the wound such as wood or glass.  Your wound is on your hand or foot and you cannot move a finger or toe. SEEK IMMEDIATE MEDICAL CARE IF:   Your pain is not controlled with prescribed medicine.  You have severe swelling around the wound causing pain and numbness or a change in color in your arm, hand, leg, or foot.  Your wound splits open and starts bleeding.  You have worsening numbness, weakness, or loss of function of any joint around or beyond the wound.  You develop painful lumps near the wound or on the skin anywhere on your body. MAKE SURE YOU:   Understand these instructions.  Will watch your condition.  Will get help right away if you are not doing well or get worse. Document Released: 02/22/2005 Document Revised: 05/17/2011 Document Reviewed: 08/18/2010 [facility] Patient Information 2015 [place], [place]. This information is not intended to replace advice given to you by your health care provider. Make sure you discuss any questions you have with your health care provider.

## 2014-07-07 NOTE — ED Notes (Signed)
Pt verbalized understanding of d/c instructions and to follow up with pcp for staple removal and has no further questions.

## 2014-07-11 ENCOUNTER — Ambulatory Visit (INDEPENDENT_AMBULATORY_CARE_PROVIDER_SITE_OTHER): Payer: 59 | Admitting: Family Medicine

## 2014-07-11 ENCOUNTER — Encounter: Payer: Self-pay | Admitting: Family Medicine

## 2014-07-11 ENCOUNTER — Telehealth: Payer: Self-pay

## 2014-07-11 VITALS — BP 130/90 | HR 100 | Wt 153.0 lb

## 2014-07-11 DIAGNOSIS — Z7251 High risk heterosexual behavior: Secondary | ICD-10-CM | POA: Diagnosis not present

## 2014-07-11 DIAGNOSIS — I10 Essential (primary) hypertension: Secondary | ICD-10-CM | POA: Diagnosis not present

## 2014-07-11 DIAGNOSIS — A5149 Other secondary syphilitic conditions: Secondary | ICD-10-CM | POA: Diagnosis not present

## 2014-07-11 DIAGNOSIS — S0101XS Laceration without foreign body of scalp, sequela: Secondary | ICD-10-CM

## 2014-07-11 NOTE — Progress Notes (Signed)
   Subjective:    Patient ID: [name] [name], male    DOB: [birth year], 39 y.o.   MRN: [id number]  HPI He is here for follow-up visit. He was seen recently in the emergency room for treatment of a scalp laceration. Also of note was a fact that he had excessive alcohol consumption. He admits to drinking as a way of handling the stress of dealing with the fact that he is quite young and has had heart disease. He admits to not handling this well. 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 He continues on Truvada for HIV prevention. The scalp is healing nicely.   Review of Systems     Objective:   Physical Exam Alert and in no distress. 3 metal sutures were removed without difficulty.       Assessment & Plan:  Secondary syphilis  Essential hypertension  High risk sexual behavior  Scalp laceration, sequela Discussed follow-up concerning the syphilis with retesting in 3 months. We will combine this with his HIV testing for his Truvada. The scalp laceration is healing. I then discussed the alcohol use in regard to his coronary disease and depression. He admits to being somewhat depressed over this. I will set him up for counseling. Discussed medication and at this point I do not feel the need. He is to call me if he has further questions.

## 2014-07-11 NOTE — Telephone Encounter (Signed)
Fax from [facility] stating patient was already approved for Brilinta 11m. Reference ##[id number]

## 2014-07-11 NOTE — Patient Instructions (Signed)
[name] [name] [phone]

## 2014-08-29 ENCOUNTER — Other Ambulatory Visit: Payer: Self-pay

## 2014-08-29 MED ORDER — LISINOPRIL 5 MG PO TABS: 5.0000 mg | ORAL_TABLET | Freq: Every day | ORAL | Status: DC

## 2014-08-29 MED ORDER — METOPROLOL TARTRATE 25 MG PO TABS: 25.0000 mg | ORAL_TABLET | Freq: Two times a day (BID) | ORAL | Status: DC

## 2014-10-11 ENCOUNTER — Encounter: Payer: Self-pay | Admitting: Cardiology

## 2014-10-11 NOTE — Progress Notes (Signed)
Patient ID: [name] [name], male   DOB: 1975/12/24, 39 y.o.   MRN: [id number]   [name] [name]    Date of visit:  10/11/2014 DOB:  [birth year]    Age:  39 yrs. Medical record number:  [id number]     Account number:  [id number] Primary Care Provider: Robyne [name] ____________________________ CURRENT DIAGNOSES  1. CAD Native without angina  2. Old myocardial infarction  3. Hyperlipidemia  4. Essential hypertension  5. Overweight  6. Presence of coronary stent  7. Nicotine dependence, cigarettes, uncomplicated ____________________________ ALLERGIES  IVP Dye, Iodine Containing, Vomiting ____________________________ MEDICATIONS  1. Truvada 200 mg-300 mg tablet, 1 p.o. daily  2. alprazolam 0.5 mg tablet, PRN  3. atorvastatin 40 mg tablet, 1 p.o. daily  4. nitroglycerin 0.4 mg sublingual tablet, PRN  5. aspirin 81 mg chewable tablet, 1 p.o. daily  6. BRILINTA 90 mg tablet, BID  7. metoprolol tartrate 25 mg tablet, BID  8. lisinopril 5 mg tablet, 1 p.o. daily  9. multivitamin tablet, 1 p.o. daily ____________________________ CHIEF COMPLAINTS  Followup of CAD Native without angina ____________________________ HISTORY OF PRESENT ILLNESS Patient seen for cardiac followup. He is doing well following his previous stenting of the LAD. He has gained about 15 pounds of weight. He continues to smoke we again discussed the importance of stopping smoking. He denies PND, orthopnea, syncope, palpitations, or claudication. ____________________________ PAST HISTORY  Past Medical Illnesses:  neurosyphilis, anxiety, hypertension, erectile dysfunction, hyperlipidemia;  Cardiovascular Illnesses:  CAD, S/P MI-anterior;  Surgical Procedures:  pilonidal cyst removal;  NYHA Classification:  I;  Canadian Angina Classification:  Class 0: Asymptomatic;  Cardiology Procedures-Invasive:  cardiac cath (left) March 2016, DES stent with 3.0 x28 mm stent 05/24/14 Dr. Irish [name];  Cardiology Procedures-Noninvasive:   echocardiogram March 2016;  Cardiac Cath Results:  normal Left main, 99% stenosis mid LAD, normal CFX, 20 % stenosis distal RCA, stent paced in LAD,  distal anterior and apical hypokinesis;  LVEF of 45% documented via echocardiogram on 05/23/2014,   ____________________________ CARDIO-PULMONARY TEST DATES EKG Date:  05/23/2014;   Cardiac Cath Date:  05/24/2014;  Echocardiography Date: 05/23/2014;  Chest Xray Date: 05/13/2014;   ____________________________ SOCIAL HISTORY Alcohol Use:  3-5 drinks qd;  Smoking:  smokes, 25 pack year history;  Diet:  regular diet;  Lifestyle:  single;  Exercise:  gym;  Occupation:  Scientist, research (medical) and [employer];  Residence:  lives with male partner;   ____________________________ REVIEW OF SYSTEMS General:  denies recent weight change, fatique or change in exercise tolerance. Ears, Nose, Throat, Mouth:  tinnitus Respiratory: denies dyspnea, cough, wheezing or hemoptysis. Cardiovascular:  please review HPI Abdominal: denies dyspepsia, GI bleeding, constipation, or diarrhea ____________________________ PHYSICAL EXAMINATION VITAL SIGNS  Blood Pressure:  124/70 Sitting, Right arm, regular cuff  , 120/80 Standing, Right arm and regular cuff   Pulse:  64/min. Weight:  160.00 lbs. Height:  64"BMI: 27  Constitutional:  pleasant white male in no acute distress Skin:  multiple tattoos Head:  normocephalic, normal hair pattern, no masses or tenderness Chest:  normal symmetry, clear to auscultation. Cardiac:  regular rhythm, normal S1 and S2, No S3 or S4, no murmurs, gallops or rubs detected. Peripheral Pulses:  the femoral,dorsalis pedis, and posterior tibial pulses are full and equal bilaterally with no bruits auscultated. Extremities & Back:  no deformities, clubbing, cyanosis, erythema or edema observed. Normal muscle strength and tone. Neurological:  no gross motor or sensory deficits noted, affect appropriate, oriented x3. ____________________________ MOST RECENT  LIPID PANEL  05/08/14  CHOL TOTL 139 mg/dl, LDL 82 NM, HDL 34 mg/dl, TRIGLYCER 117 mg/dl and CHOL/HDL 4.1 (Calc) ____________________________ IMPRESSIONS/PLAN 1. Coronary artery disease with previous anterior infarction and stenting of the LAD 2. Hyperlipidemia under treatment 3. Overweight with need to lose weight discussed with patient 4. Tobacco abuse again advised to stop  Recommendations:  Discuss smoking cessation and weight reduction with patient. Advised to get more exercise. Followup in 6 months. ____________________________ TODAYS ORDERS  1. Return Visit: 6 months  2. 12 Lead EKG: 6 months                       ____________________________ Cardiology Physician:  [name] Hough MD [facility]

## 2014-10-11 NOTE — Progress Notes (Signed)
Schedule him to come in for a medication check and repeat HIV /RPR

## 2014-10-16 ENCOUNTER — Telehealth: Payer: Self-pay

## 2014-10-16 NOTE — Telephone Encounter (Signed)
Pt has appointment to see jcl

## 2014-10-16 NOTE — Telephone Encounter (Signed)
I had [facility] something or another need a referral for this patient due to [email or web address] don't know why he needs to go I told her to call patient and tell him they could reschedule as soon as she got a referral from [place] so what do you want to do

## 2014-10-16 NOTE — Telephone Encounter (Signed)
He needs to be seen before acting give referral

## 2014-10-16 NOTE — Progress Notes (Signed)
Called pt made appt

## 2014-10-29 ENCOUNTER — Encounter: Payer: Self-pay | Admitting: Family Medicine

## 2014-10-29 ENCOUNTER — Ambulatory Visit (INDEPENDENT_AMBULATORY_CARE_PROVIDER_SITE_OTHER): Payer: 59 | Admitting: Family Medicine

## 2014-10-29 VITALS — BP 140/90 | HR 79 | Ht 64.5 in | Wt 163.0 lb

## 2014-10-29 DIAGNOSIS — Z7251 High risk heterosexual behavior: Secondary | ICD-10-CM

## 2014-10-29 DIAGNOSIS — A5149 Other secondary syphilitic conditions: Secondary | ICD-10-CM | POA: Diagnosis not present

## 2014-10-29 DIAGNOSIS — M461 Sacroiliitis, not elsewhere classified: Secondary | ICD-10-CM | POA: Diagnosis not present

## 2014-10-29 DIAGNOSIS — M25512 Pain in left shoulder: Secondary | ICD-10-CM | POA: Diagnosis not present

## 2014-10-29 DIAGNOSIS — I2510 Atherosclerotic heart disease of native coronary artery without angina pectoris: Secondary | ICD-10-CM | POA: Diagnosis not present

## 2014-10-29 MED ORDER — ALPRAZOLAM 0.5 MG PO TABS: 0.5000 mg | ORAL_TABLET | Freq: Two times a day (BID) | ORAL | Status: DC | PRN

## 2014-10-29 NOTE — Patient Instructions (Signed)
For the back and do good stretching and rotational type stretching as well and use 2 Aleve twice per day. Use heat before you stretch Pain attention to the tingling and especially which fingers it's

## 2014-10-29 NOTE — Progress Notes (Signed)
   Subjective:    Patient ID: [name] [name], male    DOB: [birth year], 39 y.o.   MRN: [id number]  HPI He is here for multiple issues. He has a previous history of syphilis and does need follow-up blood work concerning this. He also complains of left upper trapezius discomfort. 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 He also complains of attainment sensation in his left arm especially when he leans on the arm to use his computer. He does not distinguish which fingers are causing tingling. He also complains of left-sided low back pain unrelated to the shoulder. No numbness, tingling or weakness. He does have a previous history of coronary artery disease with a stent. Presently he is having no chest pain or discomfort.   Review of Systems     Objective:   Physical Exam Alert and in no distress. Full motion of the neck without pain normal motor, sensory and DTRs of the arms. No trigger point noted. Slight tenderness to palpation in the left SI joint. Faber test and stork test was positive. Normal PR.       Assessment & Plan:  Secondary syphilis - Plan: RPR  High risk sexual behavior - Plan: HIV antibody, RPR, GC/chlamydia probe amp, urine  Sacroiliitis  Trigger point of left shoulder region Recommend heat and stretching for the shoulder and for the back. Demonstrated proper technique. Recommend he keep track of the sensation in his arm to see if it's any particular nerve root distribution. Discussed median nerve versus ulnar nerve.

## 2014-10-30 LAB — HIV ANTIBODY (ROUTINE TESTING W REFLEX): HIV: NONREACTIVE

## 2014-10-30 LAB — RPR

## 2014-10-30 LAB — GC/CHLAMYDIA PROBE AMP, URINE
Chlamydia, Swab/Urine, PCR: NEGATIVE
GC Probe Amp, Urine: NEGATIVE

## 2014-11-12 ENCOUNTER — Encounter: Payer: 59 | Admitting: Family Medicine

## 2014-12-02 ENCOUNTER — Other Ambulatory Visit: Payer: Self-pay

## 2014-12-02 ENCOUNTER — Telehealth: Payer: Self-pay | Admitting: Family Medicine

## 2014-12-02 NOTE — Telephone Encounter (Signed)
Xanax called in

## 2014-12-02 NOTE — Telephone Encounter (Signed)
Called in.

## 2014-12-02 NOTE — Telephone Encounter (Signed)
Pt called and stated that xanax was never called in. Please let pt know status at [phone].

## 2014-12-03 ENCOUNTER — Emergency Department (HOSPITAL_COMMUNITY)
Admission: EM | Admit: 2014-12-03 | Discharge: 2014-12-04 | Discharge status: Against Medical Advice | Payer: 59 | Attending: Emergency Medicine | Admitting: Emergency Medicine

## 2014-12-03 DIAGNOSIS — I2510 Atherosclerotic heart disease of native coronary artery without angina pectoris: Secondary | ICD-10-CM | POA: Diagnosis not present

## 2014-12-03 DIAGNOSIS — I10 Essential (primary) hypertension: Secondary | ICD-10-CM | POA: Diagnosis not present

## 2014-12-03 DIAGNOSIS — K089 Disorder of teeth and supporting structures, unspecified: Secondary | ICD-10-CM | POA: Diagnosis present

## 2014-12-03 DIAGNOSIS — Z720 Tobacco use: Secondary | ICD-10-CM | POA: Insufficient documentation

## 2014-12-03 NOTE — ED Notes (Signed)
Pt stated tongue stopped bleeding so he wanted to go home. Pt left at this time.

## 2014-12-04 ENCOUNTER — Emergency Department (HOSPITAL_COMMUNITY)
Admission: EM | Admit: 2014-12-04 | Discharge: 2014-12-04 | Disposition: A | Discharge status: Home / Self Care | Payer: 59 | Attending: Emergency Medicine | Admitting: Emergency Medicine

## 2014-12-04 ENCOUNTER — Encounter (HOSPITAL_COMMUNITY): Payer: Self-pay

## 2014-12-04 ENCOUNTER — Emergency Department (HOSPITAL_COMMUNITY): Payer: 59

## 2014-12-04 DIAGNOSIS — E785 Hyperlipidemia, unspecified: Secondary | ICD-10-CM | POA: Diagnosis not present

## 2014-12-04 DIAGNOSIS — Z7982 Long term (current) use of aspirin: Secondary | ICD-10-CM | POA: Diagnosis not present

## 2014-12-04 DIAGNOSIS — Z8619 Personal history of other infectious and parasitic diseases: Secondary | ICD-10-CM | POA: Diagnosis not present

## 2014-12-04 DIAGNOSIS — Z720 Tobacco use: Secondary | ICD-10-CM | POA: Insufficient documentation

## 2014-12-04 DIAGNOSIS — I2510 Atherosclerotic heart disease of native coronary artery without angina pectoris: Secondary | ICD-10-CM | POA: Diagnosis not present

## 2014-12-04 DIAGNOSIS — I252 Old myocardial infarction: Secondary | ICD-10-CM | POA: Diagnosis not present

## 2014-12-04 DIAGNOSIS — F419 Anxiety disorder, unspecified: Secondary | ICD-10-CM | POA: Diagnosis not present

## 2014-12-04 DIAGNOSIS — F329 Major depressive disorder, single episode, unspecified: Secondary | ICD-10-CM | POA: Insufficient documentation

## 2014-12-04 DIAGNOSIS — Z79899 Other long term (current) drug therapy: Secondary | ICD-10-CM | POA: Diagnosis not present

## 2014-12-04 DIAGNOSIS — R079 Chest pain, unspecified: Secondary | ICD-10-CM | POA: Diagnosis present

## 2014-12-04 DIAGNOSIS — I10 Essential (primary) hypertension: Secondary | ICD-10-CM | POA: Insufficient documentation

## 2014-12-04 DIAGNOSIS — Z87438 Personal history of other diseases of male genital organs: Secondary | ICD-10-CM | POA: Diagnosis not present

## 2014-12-04 LAB — CBC
HCT: 44.8 % (ref 39.0–52.0)
Hemoglobin: 15.9 g/dL (ref 13.0–17.0)
MCH: 33.5 pg (ref 26.0–34.0)
MCHC: 35.5 g/dL (ref 30.0–36.0)
MCV: 94.3 fL (ref 78.0–100.0)
PLATELETS: 257 10*3/uL (ref 150–400)
RBC: 4.75 MIL/uL (ref 4.22–5.81)
RDW: 13.3 % (ref 11.5–15.5)
WBC: 11.1 10*3/uL — ABNORMAL HIGH (ref 4.0–10.5)

## 2014-12-04 LAB — I-STAT CHEM 8, ED
BUN: 10 mg/dL (ref 6–20)
CHLORIDE: 103 mmol/L (ref 101–111)
Calcium, Ion: 1.06 mmol/L — ABNORMAL LOW (ref 1.12–1.23)
Creatinine, Ser: 1 mg/dL (ref 0.61–1.24)
GLUCOSE: 75 mg/dL (ref 65–99)
HCT: 49 % (ref 39.0–52.0)
Hemoglobin: 16.7 g/dL (ref 13.0–17.0)
Potassium: 3.9 mmol/L (ref 3.5–5.1)
Sodium: 140 mmol/L (ref 135–145)
TCO2: 20 mmol/L (ref 0–100)

## 2014-12-04 LAB — I-STAT TROPONIN, ED: TROPONIN I, POC: 0 ng/mL (ref 0.00–0.08)

## 2014-12-04 MED ORDER — MORPHINE SULFATE (PF) 4 MG/ML IV SOLN: 4.0000 mg | Freq: Once | INTRAVENOUS | Status: DC

## 2014-12-04 MED ORDER — NITROGLYCERIN 0.4 MG SL SUBL: 0.4000 mg | SUBLINGUAL_TABLET | SUBLINGUAL | Status: DC | PRN

## 2014-12-04 NOTE — ED Notes (Signed)
Pt refused IV insertion at 2132 and IV meds

## 2014-12-04 NOTE — ED Notes (Signed)
Pt denies chest pain at this time and states that he doesn't think he needs the morphine

## 2014-12-04 NOTE — ED Provider Notes (Signed)
CSN: [id number]     Arrival date & time 12/04/14  2024 History   First MD Initiated Contact with Patient 12/04/14 2038     Chief Complaint  Patient presents with  . Chest Pain     (Consider location/radiation/quality/duration/timing/severity/associated sxs/prior Treatment) Patient is a 39 y.o. male presenting with chest pain.  Chest Pain   Past Medical History  Diagnosis Date  . 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    Marland Kitchen Hypertension     medication in 2012, stress as a big component  . Hyperlipidemia   . Anxiety   . Depression   . Erectile dysfunction   . Coronary artery disease     a. 05/2014 OOH MI/PCI: LM nl, LAD 40p, 99p (3.0x28 Synergy DES) w/ R->L Collaterals, LCX nl, RCA 30p/d, EF 55% w/ sev distal inf/ant HK.  . Tobacco abuse   . H/O echocardiogram     a. 05/2014 Echo: Ef 45-50%, sev mid antsept/ant HK.  Marland Kitchen CAD (coronary artery disease), native coronary artery 05/24/2014    Anterior infarction occurring out of Hospital approximately week prior to admission 3/17 16  Cardiac catheterization 3/18 showing a proximal 40% segmental LAD, 99% mid LAD with collaterals from the right coronary artery, normal circumflex, 20-30% mid and distal right coronary artery stenosis.  3.0 x 28 mm Synergy stent by Dr. Irish Lack post dilated to 3.25 mm (incidental note that LAD was closed at the time of the PCI a few minutes after the catheterization but with no symptoms)  Echo shows distal anterior apical and distal inferior severe hypokinesis with EF around 50%   . MI (myocardial infarction)    Past Surgical History  Procedure Laterality Date  . Pilonidal cyst excision  1995  . Coronary stent placement  05/24/2014    MID LAD DES   . Left heart catheterization with coronary angiogram N/A 05/24/2014    Procedure: LEFT HEART CATHETERIZATION WITH CORONARY ANGIOGRAM;  Surgeon: Jettie Booze, MD;  Location: [facility] CATH LAB;  Service: Cardiovascular;  Laterality: N/A;  . Percutaneous  coronary stent intervention (pci-s)  05/24/2014    Procedure: PERCUTANEOUS CORONARY STENT INTERVENTION (PCI-S);  Surgeon: Jettie Booze, MD;  Location: [facility] CATH LAB;  Service: Cardiovascular;;   Family History  Problem Relation Age of Onset  . Thyroid disease Mother   . Heart disease Father 54    MI, stents  . Peripheral Artery Disease Father   . Diabetes type II Father   . Diabetes Father   . Diabetes Maternal Grandmother   . Hypertension Maternal Grandfather   . Hypertension Paternal Grandfather   . Cancer Neg Hx   . Stroke Neg Hx    Social History  Substance Use Topics  . Smoking status: Current Every Day Smoker -- 1.00 packs/day for 23 years    Types: Cigarettes  . Smokeless tobacco: Never Used  . Alcohol Use: 14.4 oz/week    24 Cans of beer per week     Comment: "3-5 times/wk I'll have 3-5 beers; max of 24 beers/wk"    Review of Systems  Cardiovascular: Positive for chest pain.      Allergies  Gadolinium derivatives  Home Medications   Prior to Admission medications   Medication Sig Start Date End Date Taking? Authorizing Provider  ALPRAZolam Duanne Moron) 0.5 MG tablet Take 1 tablet (0.5 mg total) by mouth 2 (two) times daily as needed for anxiety. 10/29/14  Yes Denita Lung, MD  aspirin EC 81 MG EC tablet  Take 1 tablet (81 mg total) by mouth daily. 05/25/14  Yes Rogelia Mire, NP  atorvastatin (LIPITOR) 40 MG tablet Take 1 tablet (40 mg total) by mouth daily at 6 PM. 05/25/14  Yes Rogelia Mire, NP  emtricitabine-tenofovir (TRUVADA) 200-300 MG per tablet Take 1 tablet by mouth daily. 05/09/14  Yes Thayer Headings, MD  lisinopril (PRINIVIL,ZESTRIL) 5 MG tablet Take 1 tablet (5 mg total) by mouth daily. 08/29/14  Yes Rogelia Mire, NP  metoprolol tartrate (LOPRESSOR) 25 MG tablet Take 1 tablet (25 mg total) by mouth 2 (two) times daily. 08/29/14  Yes Rogelia Mire, NP  Multiple Vitamin (MULTIVITAMIN WITH MINERALS) TABS tablet Take 1 tablet by mouth  daily.   Yes Historical Provider, MD  ticagrelor (BRILINTA) 90 MG TABS tablet Take 1 tablet (90 mg total) by mouth 2 (two) times daily. 05/25/14  Yes Rogelia Mire, NP  nitroGLYCERIN (NITROSTAT) 0.4 MG SL tablet Place 1 tablet (0.4 mg total) under the tongue every 5 (five) minutes x 3 doses as needed for chest pain. 05/25/14   Rogelia Mire, NP  Tetrahydrozoline HCl (VISINE OP) Place 1 drop into both eyes daily as needed (red eyes).    Historical Provider, MD   BP 125/70 mmHg  Pulse 87  Temp(Src) 98.1 F (36.7 C) (Oral)  Resp 16  SpO2 97% Physical Exam  ED Course  Procedures (including critical care time) Labs Review Labs Reviewed  CBC - Abnormal; Notable for the following:    WBC 11.1 (*)    All other components within normal limits  I-STAT CHEM 8, ED - Abnormal; Notable for the following:    Calcium, Ion 1.06 (*)    All other components within normal limits  Randolm Idol, ED    Imaging Review Dg Chest 2 View  12/04/2014   CLINICAL DATA:  Chest pain.  EXAM: CHEST  2 VIEW  COMPARISON:  05/13/2014  FINDINGS: The heart size and mediastinal contours are within normal limits. Chronic central peribronchial thickening again seen bilaterally. No evidence of acute infiltrate or edema. No evidence of pleural effusion.  IMPRESSION: Chronic central peribronchial thickening.  No active disease.   Electronically Signed   By: Earle Gell M.D.   On: 12/04/2014 21:31   I have personally reviewed and evaluated these images and lab results as part of my medical decision-making.   EKG Interpretation   Date/Time:  Wednesday December 04 2014 20:28:13 EDT Ventricular Rate:  95 PR Interval:  131 QRS Duration: 87 QT Interval:  359 QTC Calculation: 451 R Axis:   -92 Text Interpretation:  Sinus rhythm Inferior infarct, old Anterior infarct,  old Confirmed by ZAMMIT  MD, JOSEPH [phone]) on 12/04/2014 8:50:53 PM      MDM   Final diagnoses:  Chest pain at rest   patient with chest  pain. Patient with a history of coronary artery disease. I felt the patient should be admitted for chest pain. The patient refused admission    Milton Ferguson, MD 12/04/14 2211

## 2014-12-04 NOTE — ED Notes (Signed)
Pt complains of chest and back pain that started 45 minutes ago, had an MI in Feb here at [facility], Cath lab stent placed

## 2014-12-04 NOTE — Discharge Instructions (Signed)
Follow up with your md this week.

## 2015-01-03 ENCOUNTER — Other Ambulatory Visit: Payer: Self-pay | Admitting: Internal Medicine

## 2015-01-04 ENCOUNTER — Other Ambulatory Visit: Payer: Self-pay | Admitting: Internal Medicine

## 2015-01-07 ENCOUNTER — Telehealth: Payer: Self-pay

## 2015-01-07 MED ORDER — EMTRICITABINE-TENOFOVIR DF 200-300 MG PO TABS: 1.0000 | ORAL_TABLET | Freq: Every day | ORAL | Status: DC

## 2015-01-07 NOTE — Telephone Encounter (Signed)
Refill request for Truvada 200-351m #30 (had med check in September)

## 2015-01-10 NOTE — ED Provider Notes (Signed)
CSN: [id number]     Arrival date & time 12/04/14  2024 History   First MD Initiated Contact with Patient 12/04/14 2038     Chief Complaint  Patient presents with  . Chest Pain     (Consider location/radiation/quality/duration/timing/severity/associated sxs/prior Treatment) Patient is a 39 y.o. male presenting with chest pain. The history is provided by the patient (the pt states he had some chest pain today.  he is not hurting now).  Chest Pain Pain location:  Substernal area Pain quality: dull   Pain radiates to the back: no   Onset quality:  Sudden Progression:  Resolved Chronicity:  Recurrent Context: not breathing   Associated symptoms: no abdominal pain, no back pain, no cough, no fatigue and no headache     Past Medical History  Diagnosis Date  . 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    Marland Kitchen Hypertension     medication in 2012, stress as a big component  . Hyperlipidemia   . Anxiety   . Depression   . Erectile dysfunction   . Coronary artery disease     a. 05/2014 OOH MI/PCI: LM nl, LAD 40p, 99p (3.0x28 Synergy DES) w/ R->L Collaterals, LCX nl, RCA 30p/d, EF 55% w/ sev distal inf/ant HK.  . Tobacco abuse   . H/O echocardiogram     a. 05/2014 Echo: Ef 45-50%, sev mid antsept/ant HK.  Marland Kitchen CAD (coronary artery disease), native coronary artery 05/24/2014    Anterior infarction occurring out of Hospital approximately week prior to admission 3/17 16  Cardiac catheterization 3/18 showing a proximal 40% segmental LAD, 99% mid LAD with collaterals from the right coronary artery, normal circumflex, 20-30% mid and distal right coronary artery stenosis.  3.0 x 28 mm Synergy stent by Dr. Irish Lack post dilated to 3.25 mm (incidental note that LAD was closed at the time of the PCI a few minutes after the catheterization but with no symptoms)  Echo shows distal anterior apical and distal inferior severe hypokinesis with EF around 50%   . MI (myocardial infarction)    Past  Surgical History  Procedure Laterality Date  . Pilonidal cyst excision  1995  . Coronary stent placement  05/24/2014    MID LAD DES   . Left heart catheterization with coronary angiogram N/A 05/24/2014    Procedure: LEFT HEART CATHETERIZATION WITH CORONARY ANGIOGRAM;  Surgeon: Jettie Booze, MD;  Location: [facility] CATH LAB;  Service: Cardiovascular;  Laterality: N/A;  . Percutaneous coronary stent intervention (pci-s)  05/24/2014    Procedure: PERCUTANEOUS CORONARY STENT INTERVENTION (PCI-S);  Surgeon: Jettie Booze, MD;  Location: [facility] CATH LAB;  Service: Cardiovascular;;   Family History  Problem Relation Age of Onset  . Thyroid disease Mother   . Heart disease Father 61    MI, stents  . Peripheral Artery Disease Father   . Diabetes type II Father   . Diabetes Father   . Diabetes Maternal Grandmother   . Hypertension Maternal Grandfather   . Hypertension Paternal Grandfather   . Cancer Neg Hx   . Stroke Neg Hx    Social History  Substance Use Topics  . Smoking status: Current Every Day Smoker -- 1.00 packs/day for 23 years    Types: Cigarettes  . Smokeless tobacco: Never Used  . Alcohol Use: 14.4 oz/week    24 Cans of beer per week     Comment: "3-5 times/wk I'll have 3-5 beers; max of 24 beers/wk"    Review of  Systems  Constitutional: Negative for appetite change and fatigue.  HENT: Negative for congestion, ear discharge and sinus pressure.   Eyes: Negative for discharge.  Respiratory: Negative for cough.   Cardiovascular: Positive for chest pain.  Gastrointestinal: Negative for abdominal pain and diarrhea.  Genitourinary: Negative for frequency and hematuria.  Musculoskeletal: Negative for back pain.  Skin: Negative for rash.  Neurological: Negative for seizures and headaches.  Psychiatric/Behavioral: Negative for hallucinations.      Allergies  Gadolinium derivatives  Home Medications   Prior to Admission medications   Medication Sig Start Date End Date  Taking? Authorizing Provider  ALPRAZolam Duanne Moron) 0.5 MG tablet Take 1 tablet (0.5 mg total) by mouth 2 (two) times daily as needed for anxiety. 10/29/14  Yes Denita Lung, MD  aspirin EC 81 MG EC tablet Take 1 tablet (81 mg total) by mouth daily. 05/25/14  Yes Rogelia Mire, NP  atorvastatin (LIPITOR) 40 MG tablet Take 1 tablet (40 mg total) by mouth daily at 6 PM. 05/25/14  Yes Rogelia Mire, NP  lisinopril (PRINIVIL,ZESTRIL) 5 MG tablet Take 1 tablet (5 mg total) by mouth daily. 08/29/14  Yes Rogelia Mire, NP  metoprolol tartrate (LOPRESSOR) 25 MG tablet Take 1 tablet (25 mg total) by mouth 2 (two) times daily. 08/29/14  Yes Rogelia Mire, NP  Multiple Vitamin (MULTIVITAMIN WITH MINERALS) TABS tablet Take 1 tablet by mouth daily.   Yes Historical Provider, MD  ticagrelor (BRILINTA) 90 MG TABS tablet Take 1 tablet (90 mg total) by mouth 2 (two) times daily. 05/25/14  Yes Rogelia Mire, NP  emtricitabine-tenofovir (TRUVADA) 200-300 MG tablet Take 1 tablet by mouth daily. 01/07/15   Denita Lung, MD  nitroGLYCERIN (NITROSTAT) 0.4 MG SL tablet Place 1 tablet (0.4 mg total) under the tongue every 5 (five) minutes x 3 doses as needed for chest pain. 05/25/14   Rogelia Mire, NP  Tetrahydrozoline HCl (VISINE OP) Place 1 drop into both eyes daily as needed (red eyes).    Historical Provider, MD   BP 113/53 mmHg  Pulse 89  Temp(Src) 98.1 F (36.7 C) (Oral)  Resp 16  SpO2 95% Physical Exam  Constitutional: He is oriented to person, place, and time. He appears well-developed.  HENT:  Head: Normocephalic.  Eyes: Conjunctivae and EOM are normal. No scleral icterus.  Neck: Neck supple. No thyromegaly present.  Cardiovascular: Normal rate and regular rhythm.  Exam reveals no gallop and no friction rub.   No murmur heard. Pulmonary/Chest: No stridor. He has no wheezes. He has no rales. He exhibits no tenderness.  Abdominal: He exhibits no distension. There is no  tenderness. There is no rebound.  Musculoskeletal: Normal range of motion. He exhibits no edema.  Lymphadenopathy:    He has no cervical adenopathy.  Neurological: He is oriented to person, place, and time. He exhibits normal muscle tone. Coordination normal.  Skin: No rash noted. No erythema.  Psychiatric: He has a normal mood and affect. His behavior is normal.    ED Course  Procedures (including critical care time) Labs Review Labs Reviewed  CBC - Abnormal; Notable for the following:    WBC 11.1 (*)    All other components within normal limits  I-STAT CHEM 8, ED - Abnormal; Notable for the following:    Calcium, Ion 1.06 (*)    All other components within normal limits  I-STAT TROPOININ, ED    Imaging Review No results found. I have personally reviewed and  evaluated these images and lab results as part of my medical decision-making.   EKG Interpretation   Date/Time:  Wednesday December 04 2014 20:28:13 EDT Ventricular Rate:  95 PR Interval:  131 QRS Duration: 87 QT Interval:  359 QTC Calculation: 451 R Axis:   -92 Text Interpretation:  Sinus rhythm Inferior infarct, old Anterior infarct,  old Confirmed by Katelyn Broadnax  MD, [place] Catlin [phone]) on 12/04/2014 8:50:53 PM      MDM   Final diagnoses:  Chest pain at rest    Patient with unremarkable labs. Chest pain resolved. Patient does not want any IV treatment. He is to follow-up with his doctor this week for reevaluation.   Milton Ferguson, MD 01/10/15 [phone]

## 2015-02-20 ENCOUNTER — Ambulatory Visit (INDEPENDENT_AMBULATORY_CARE_PROVIDER_SITE_OTHER): Payer: 59 | Admitting: Family Medicine

## 2015-02-20 ENCOUNTER — Encounter: Payer: Self-pay | Admitting: Family Medicine

## 2015-02-20 VITALS — BP 124/72 | HR 80 | Resp 14 | Wt 166.6 lb

## 2015-02-20 DIAGNOSIS — Z7251 High risk heterosexual behavior: Secondary | ICD-10-CM

## 2015-02-20 DIAGNOSIS — Z23 Encounter for immunization: Secondary | ICD-10-CM

## 2015-02-20 NOTE — Progress Notes (Signed)
   Subjective:    Patient ID: [name] [name], male    DOB: 1975-08-11, 39 y.o.   MRN: [id number]  HPI He is here for recheck on his HIV. He continues on Truvada. He is in a LTR but prefers to stay on the medication to be safe. Review his record indicates he has had secondary syphilis however the last RPR was nonreactive. He has not had a flu shot.  Review of Systems     Objective:   Physical Exam  Alert and in no distress otherwise not examined      Assessment & Plan:  High risk sexual behavior - Plan: HIV antibody  Need for prophylactic vaccination and inoculation against influenza - Plan: Flu Vaccine QUAD 36+ mos IM  discussed the risk and benefit of the flu shot and he agreed to have it.

## 2015-02-21 LAB — HIV ANTIBODY (ROUTINE TESTING W REFLEX): HIV 1&2 Ab, 4th Generation: NONREACTIVE

## 2015-02-21 MED ORDER — EMTRICITABINE-TENOFOVIR DF 200-300 MG PO TABS: 1.0000 | ORAL_TABLET | Freq: Every day | ORAL | Status: DC

## 2015-02-21 NOTE — Addendum Note (Signed)
Addended by: Denita Lung on: 02/21/2015 12:40 PM   Modules accepted: Orders

## 2015-05-09 ENCOUNTER — Telehealth: Payer: Self-pay | Admitting: Family Medicine

## 2015-05-09 MED ORDER — EMTRICITABINE-TENOFOVIR DF 200-300 MG PO TABS: 1.0000 | ORAL_TABLET | Freq: Every day | ORAL | Status: DC

## 2015-05-09 NOTE — Telephone Encounter (Signed)
[facility] of [facility]ada 200-[facility]m [facility]

## 2015-05-13 ENCOUNTER — Telehealth: Payer: Self-pay | Admitting: Family Medicine

## 2015-05-13 MED ORDER — EMTRICITABINE-TENOFOVIR DF 200-300 MG PO TABS: 1.0000 | ORAL_TABLET | Freq: Every day | ORAL | Status: DC

## 2015-05-13 NOTE — Telephone Encounter (Signed)
Pt states that Truvada refill need to go to [place] # [phone]

## 2015-06-05 ENCOUNTER — Ambulatory Visit: Payer: Self-pay | Admitting: Family Medicine

## 2015-06-12 ENCOUNTER — Encounter: Payer: Self-pay | Admitting: Family Medicine

## 2015-06-12 ENCOUNTER — Ambulatory Visit (INDEPENDENT_AMBULATORY_CARE_PROVIDER_SITE_OTHER): Payer: BLUE CROSS/BLUE SHIELD | Admitting: Family Medicine

## 2015-06-12 ENCOUNTER — Telehealth: Payer: Self-pay | Admitting: Family Medicine

## 2015-06-12 VITALS — BP 130/84 | HR 83 | Wt 172.0 lb

## 2015-06-12 DIAGNOSIS — Z79899 Other long term (current) drug therapy: Secondary | ICD-10-CM

## 2015-06-12 DIAGNOSIS — J029 Acute pharyngitis, unspecified: Secondary | ICD-10-CM | POA: Diagnosis not present

## 2015-06-12 DIAGNOSIS — F418 Other specified anxiety disorders: Secondary | ICD-10-CM

## 2015-06-12 DIAGNOSIS — A5149 Other secondary syphilitic conditions: Secondary | ICD-10-CM

## 2015-06-12 DIAGNOSIS — M25512 Pain in left shoulder: Secondary | ICD-10-CM

## 2015-06-12 DIAGNOSIS — Z7251 High risk heterosexual behavior: Secondary | ICD-10-CM | POA: Diagnosis not present

## 2015-06-12 LAB — CBC WITH DIFFERENTIAL/PLATELET
BASOS ABS: 0 {cells}/uL (ref 0–200)
Basophils Relative: 0 %
EOS PCT: 4 %
Eosinophils Absolute: 288 cells/uL (ref 15–500)
HEMATOCRIT: 45.6 % (ref 38.5–50.0)
Hemoglobin: 15.5 g/dL (ref 13.2–17.1)
Lymphocytes Relative: 39 %
Lymphs Abs: 2808 cells/uL (ref 850–3900)
MCH: 31.4 pg (ref 27.0–33.0)
MCHC: 34 g/dL (ref 32.0–36.0)
MCV: 92.5 fL (ref 80.0–100.0)
MONO ABS: 648 {cells}/uL (ref 200–950)
MPV: 8.5 fL (ref 7.5–12.5)
Monocytes Relative: 9 %
NEUTROS PCT: 48 %
Neutro Abs: 3456 cells/uL (ref 1500–7800)
Platelets: 252 10*3/uL (ref 140–400)
RBC: 4.93 MIL/uL (ref 4.20–5.80)
RDW: 13.9 % (ref 11.0–15.0)
WBC: 7.2 10*3/uL (ref 4.0–10.5)

## 2015-06-12 MED ORDER — ALPRAZOLAM 0.5 MG PO TABS: 0.5000 mg | ORAL_TABLET | Freq: Two times a day (BID) | ORAL | Status: DC | PRN

## 2015-06-12 MED ORDER — EMTRICITABINE-TENOFOVIR DF 200-300 MG PO TABS: 1.0000 | ORAL_TABLET | Freq: Every day | ORAL | Status: DC

## 2015-06-12 NOTE — Telephone Encounter (Signed)
As pt was leaving his appt today, he wanted to make sure that Dr Redmond [facility] place refills on Truvada

## 2015-06-12 NOTE — Patient Instructions (Signed)
Massage should help with the back.

## 2015-06-12 NOTE — Progress Notes (Signed)
   Subjective:    Patient ID: [name] [name], male    DOB: 05/02/1975, 40 y.o.   MRN: [id number]  HPI He is here for a recheck. He continues on Truvada. He has had no fever, chills, abdominal pain. He has had some recent sore throat as well as some postnasal drainage but no itchy watery eyes, cough, sneezing. He did have some sexual activity approximately 1 week prior to this.He has a previous history of secondary syphilis which was treated.He also complains of left upper back pain in the rhomboid area. This has been going on for 2 months. No history of injury. A size apparently did get some relief of his symptoms. No numbness, tingling or weakness in his arms. He would also like a renewal on his Xanax. He uses this very sparingly.   Review of Systems     Objective:   Physical Exam Alert and in no distress. Tympanic membranes and canals are normal. Pharyngeal area is normal. Neck is supple without adenopathy or thyromegaly. Cardiac exam shows a regular sinus rhythm without murmurs or gallops. Lungs are clear to auscultation. Left shoulder shows full motion. Questionable trigger point is noted in the left trapezius.       Assessment & Plan:  High risk sexual behavior - Plan: HIV antibody, CBC with Differential/Platelet, Comprehensive metabolic panel, RPR, emtricitabine-tenofovir (TRUVADA) 200-300 MG tablet  Sore throat - Plan: CANCELED: GC/Chlamydia Probe Amp  Encounter for long-term (current) use of other high-risk medications - Plan: HIV antibody, CBC with Differential/Platelet, Comprehensive metabolic panel, RPR, emtricitabine-tenofovir (TRUVADA) 200-300 MG tablet  Secondary syphilis  Trigger point of left shoulder region  Situational anxiety - Plan: ALPRAZolam (XANAX) 0.5 MG tablet I will renew his Truvada. GC/Chlamydia culture was ordered. Recommend heat and stretching and return to massage to help with his shoulder. Xanax was renewed.

## 2015-06-13 LAB — COMPREHENSIVE METABOLIC PANEL
ALBUMIN: 4.3 g/dL (ref 3.6–5.1)
ALT: 45 U/L (ref 9–46)
AST: 31 U/L (ref 10–40)
Alkaline Phosphatase: 73 U/L (ref 40–115)
BILIRUBIN TOTAL: 0.4 mg/dL (ref 0.2–1.2)
BUN: 9 mg/dL (ref 7–25)
CALCIUM: 8.9 mg/dL (ref 8.6–10.3)
CHLORIDE: 104 mmol/L (ref 98–110)
CO2: 23 mmol/L (ref 20–31)
Creat: 0.68 mg/dL (ref 0.60–1.35)
GLUCOSE: 81 mg/dL (ref 65–99)
Potassium: 4.3 mmol/L (ref 3.5–5.3)
Sodium: 140 mmol/L (ref 135–146)
Total Protein: 6.9 g/dL (ref 6.1–8.1)

## 2015-06-13 LAB — RPR

## 2015-06-13 LAB — HIV ANTIBODY (ROUTINE TESTING W REFLEX): HIV 1&2 Ab, 4th Generation: NONREACTIVE

## 2015-06-13 NOTE — Telephone Encounter (Signed)
-----   Message from Denita Lung, MD sent at 06/13/2015  8:06 AM EDT ----- The blood work is negative. Continue on present medications.

## 2015-06-13 NOTE — Telephone Encounter (Signed)
Patient aware of results and medications.

## 2015-06-17 ENCOUNTER — Other Ambulatory Visit: Payer: Self-pay

## 2015-06-17 LAB — GC/CHLAMYDIA PROBE, AMP (THROAT)
CHLAMYDIA TRACHOMATIS RNA (THROAT) APTIMA: NOT DETECTED
NEISSERIA GONORRHOEAE RNA (THROAT): NOT DETECTED

## 2015-07-28 ENCOUNTER — Other Ambulatory Visit: Payer: Self-pay | Admitting: Family Medicine

## 2015-07-28 NOTE — Telephone Encounter (Signed)
Is this okay to refill? 

## 2015-10-20 ENCOUNTER — Ambulatory Visit (INDEPENDENT_AMBULATORY_CARE_PROVIDER_SITE_OTHER): Payer: BLUE CROSS/BLUE SHIELD | Admitting: Family Medicine

## 2015-10-20 ENCOUNTER — Encounter: Payer: Self-pay | Admitting: Family Medicine

## 2015-10-20 ENCOUNTER — Other Ambulatory Visit: Payer: Self-pay | Admitting: Family Medicine

## 2015-10-20 VITALS — BP 130/82 | HR 92 | Wt 168.0 lb

## 2015-10-20 DIAGNOSIS — J029 Acute pharyngitis, unspecified: Secondary | ICD-10-CM

## 2015-10-20 DIAGNOSIS — J209 Acute bronchitis, unspecified: Secondary | ICD-10-CM

## 2015-10-20 DIAGNOSIS — Z209 Contact with and (suspected) exposure to unspecified communicable disease: Secondary | ICD-10-CM | POA: Diagnosis not present

## 2015-10-20 DIAGNOSIS — F17200 Nicotine dependence, unspecified, uncomplicated: Secondary | ICD-10-CM

## 2015-10-20 DIAGNOSIS — Z7251 High risk heterosexual behavior: Secondary | ICD-10-CM | POA: Diagnosis not present

## 2015-10-20 DIAGNOSIS — J040 Acute laryngitis: Secondary | ICD-10-CM | POA: Diagnosis not present

## 2015-10-20 MED ORDER — DOXYCYCLINE HYCLATE 100 MG PO TABS: 100.0000 mg | ORAL_TABLET | Freq: Two times a day (BID) | ORAL | 0 refills | Status: DC

## 2015-10-20 NOTE — Progress Notes (Signed)
   Subjective:    Patient ID: [name] [name], male    DOB: 1975/09/13, 40 y.o.   MRN: [id number]  HPI He is here for multiple issues. He needs follow-up on being on PREP. He does have a previous history of syphilis however the last syphilis test was nonreactive. He does complain of a 2 months history of hoarse voice as well as intermittent cough but no fever, chills, sore throat. He does smoke. He also complains of an intermittent tingling sensation in his left arm. He cannot relate this to wrist, elbow, shoulder or neck position. The symptoms go away relatively quickly. He has noted no weakness with physical activities.  Review of Systems     Objective:   Physical Exam Alert and in no distress. A slight tremor is noted. Reflexes are 2-3+. His voice is slightly raspy Tympanic membranes and canals are normal. Pharyngeal area is erythematous but no exudates. Neck is supple without adenopathy or thyromegaly. Cardiac exam shows a regular sinus rhythm without murmurs or gallops. Lungs show scattered rhonchi Left arm exam shows normal strength and sensation. Pulses are normal.       Assessment & Plan:  High risk sexual behavior - Plan: HIV antibody, RPR  Contact with or exposure to communicable disease - Plan: HIV antibody, RPR  Laryngitis - Plan: GC/Chlamydia Probe Amp  Acute bronchitis, unspecified organism - Plan: doxycycline (VIBRA-TABS) 100 MG tablet  Acute pharyngitis, unspecified etiology - Plan: doxycycline (VIBRA-TABS) 100 MG tablet, GC/Chlamydia Probe Amp I will follow-up on all the STD tests. I will start him on doxycycline to help with his lungs and to see if he'll help with his voice. Will also do throat culture on him for GC/chlamydia. May consider ENT if he does not respond to this. Reasoning behind the arm discomfort is unclear at this time. His might need to be pursued at a later basis.

## 2015-10-21 LAB — HIV ANTIBODY (ROUTINE TESTING W REFLEX): HIV 1&2 Ab, 4th Generation: NONREACTIVE

## 2015-10-21 LAB — RPR

## 2015-10-22 LAB — GC/CHLAMYDIA PROBE AMP

## 2015-10-27 LAB — CHLAMYDIA CULTURE

## 2015-10-30 ENCOUNTER — Other Ambulatory Visit: Payer: Self-pay | Admitting: Family Medicine

## 2015-10-30 NOTE — Telephone Encounter (Signed)
Is this okay to refill? 

## 2015-11-05 ENCOUNTER — Telehealth: Payer: Self-pay | Admitting: Family Medicine

## 2015-11-05 NOTE — Telephone Encounter (Signed)
Okay to refill? 

## 2015-11-05 NOTE — Telephone Encounter (Signed)
Requesting refill on Xanax

## 2015-11-06 ENCOUNTER — Other Ambulatory Visit: Payer: Self-pay

## 2015-11-06 DIAGNOSIS — F418 Other specified anxiety disorders: Secondary | ICD-10-CM

## 2015-11-06 MED ORDER — ALPRAZOLAM 0.5 MG PO TABS: 0.5000 mg | ORAL_TABLET | Freq: Two times a day (BID) | ORAL | 0 refills | Status: DC | PRN

## 2015-11-06 NOTE — Telephone Encounter (Signed)
Called in xanax

## 2015-11-06 NOTE — Telephone Encounter (Signed)
Called in xanax per jcl

## 2015-12-24 ENCOUNTER — Ambulatory Visit (INDEPENDENT_AMBULATORY_CARE_PROVIDER_SITE_OTHER): Payer: BLUE CROSS/BLUE SHIELD | Admitting: Medical

## 2015-12-24 ENCOUNTER — Encounter: Payer: Self-pay | Admitting: Medical

## 2015-12-24 ENCOUNTER — Telehealth: Payer: Self-pay | Admitting: Medical

## 2015-12-24 VITALS — BP 128/90 | HR 88 | Temp 97.3°F | Ht 64.5 in | Wt 168.0 lb

## 2015-12-24 DIAGNOSIS — Z91018 Allergy to other foods: Secondary | ICD-10-CM | POA: Diagnosis not present

## 2015-12-24 DIAGNOSIS — I2510 Atherosclerotic heart disease of native coronary artery without angina pectoris: Secondary | ICD-10-CM | POA: Diagnosis not present

## 2015-12-24 DIAGNOSIS — T783XXA Angioneurotic edema, initial encounter: Secondary | ICD-10-CM

## 2015-12-24 DIAGNOSIS — N529 Male erectile dysfunction, unspecified: Secondary | ICD-10-CM

## 2015-12-24 DIAGNOSIS — T7840XA Allergy, unspecified, initial encounter: Secondary | ICD-10-CM | POA: Diagnosis not present

## 2015-12-24 MED ORDER — PREDNISONE 10 MG PO TABS: ORAL_TABLET | ORAL | 0 refills | Status: DC

## 2015-12-24 MED ORDER — EPINEPHRINE 0.3 MG/0.3ML IJ SOAJ
0.3000 mg | Freq: Once | INTRAMUSCULAR | 0 refills | Status: AC
Start: 2015-12-24 — End: 2015-12-24

## 2015-12-24 NOTE — Telephone Encounter (Signed)
FYI - he requested Viagra today at the end of acute visit for allergic reaction.  I told him to f/u with you as I don't think you guys have discussed, plus he is s/p MI this past year.

## 2015-12-24 NOTE — Progress Notes (Signed)
Subjective: Chief Complaint  Patient presents with  . Allergic Reaction    last night ate a banana, itchy throat, swelling, benadryl helped, feels better today still having swelling   Here for allergic reaction.  In the past he felt like he has had problems with cantaloupe and bananas.  But yesterday within minutes after eating a banana started getting itchy around lips and in mouth. Shortly thereafter had swelling of lips and face.   He took a picture, and I reviewed the pic.  It does show angioedema of lips and orbits/face.  He immediately took 2 benadryl tablets OTC, presumably 73m.  The swelling calmed down as well as itching.   He took 2 more benadryl this morning.   So far is fine today.   He notes no additional swelling today.  Still feels a little itchy.  He does have hx/o hives in remote past with cats and hay fever.   He doesn't tolerate milk so well either.   Gets bad gas with milk products.   He has been on Lisinopril 588mfor about a year without c/o.  No other aggravating or relieving factors. No other complaint.  Past Medical History:  Diagnosis Date  . Anxiety   . CAD (coronary artery disease), native coronary artery 05/24/2014   Anterior infarction occurring out of Hospital approximately week prior to admission 3/17 16  Cardiac catheterization 3/18 showing a proximal 40% segmental LAD, 99% mid LAD with collaterals from the right coronary artery, normal circumflex, 20-30% mid and distal right coronary artery stenosis.  3.0 x 28 mm Synergy stent by Dr. VaIrish Lackost dilated to 3.25 mm (incidental note that LAD was closed at the time of the PCI a few minutes after the catheterization but with no symptoms)  Echo shows distal anterior apical and distal inferior severe hypokinesis with EF around 50%   . Coronary artery disease    a. 05/2014 OOH MI/PCI: LM nl, LAD 40p, 99p (3.0x28 Synergy DES) w/ R->L Collaterals, LCX nl, RCA 30p/d, EF 55% w/ sev distal inf/ant HK.  . Depression   . Erectile  dysfunction   . H/O echocardiogram    a. 05/2014 Echo: Ef 45-50%, sev mid antsept/ant HK.  . Marland Kitchenyperlipidemia   . Hypertension    medication in 2012, stress as a big component  . MI (myocardial infarction)   . 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    . Tobacco abuse    Current Outpatient Prescriptions on File Prior to Visit  Medication Sig Dispense Refill  . aspirin EC 81 MG EC tablet Take 1 tablet (81 mg total) by mouth daily.    . Marland Kitchentorvastatin (LIPITOR) 40 MG tablet Take 1 tablet (40 mg total) by mouth daily at 6 PM. 30 tablet 6  . doxycycline (VIBRA-TABS) 100 MG tablet Take 1 tablet (100 mg total) by mouth 2 (two) times daily. 28 tablet 0  . lisinopril (PRINIVIL,ZESTRIL) 5 MG tablet Take 1 tablet (5 mg total) by mouth daily. 30 tablet 0  . metoprolol tartrate (LOPRESSOR) 25 MG tablet Take 1 tablet (25 mg total) by mouth 2 (two) times daily. 60 tablet 0  . Multiple Vitamin (MULTIVITAMIN WITH MINERALS) TABS tablet Take 1 tablet by mouth daily.    . TRUVADA 200-300 MG tablet TAKE 1 TABLET BY MOUTH DAILY 90 tablet 0  . ALPRAZolam (XANAX) 0.5 MG tablet Take 1 tablet (0.5 mg total) by mouth 2 (two) times daily as needed for anxiety. (Patient not taking: Reported on  12/24/2015) 20 tablet 0  . nitroGLYCERIN (NITROSTAT) 0.4 MG SL tablet Place 1 tablet (0.4 mg total) under the tongue every 5 (five) minutes x 3 doses as needed for chest pain. (Patient not taking: Reported on 12/24/2015) 25 tablet 3   No current facility-administered medications on file prior to visit.     ROS as in subjective   Objective: BP 128/90 (BP Location: Right Arm, Patient Position: Sitting, Cuff Size: Normal)   Pulse 88   Temp 97.3 F (36.3 C) (Oral)   Ht 5' 4.5" (1.638 m)   Wt 168 lb (76.2 kg)   SpO2 97%   BMI 28.39 kg/m   General appearance: alert, no distress, WD/WN HEENT: normocephalic, sclerae anicteric, TMs pearly, nares patent, no discharge or erythema, pharynx normal Oral cavity: MMM, no  lesions, mild swelling of upper and lower lips Neck: supple, no lymphadenopathy, no thyromegaly, no masses Heart: RRR, normal S1, S2, no murmurs Lungs: CTA bilaterally, no wheezes, rhonchi, or rales Pulses: 2+ symmetric, upper and lower extremities, normal cap refill No edema Skin: no rash, no hives    Assessment: Encounter Diagnoses  Name Primary?  . Allergic reaction, initial encounter Yes  . Angioedema, initial encounter   . Food allergy   . Erectile dysfunction, unspecified erectile dysfunction type   . Coronary artery disease involving native coronary artery of native heart without angina pectoris      Plan: Begin prednisone, gave script for Epipen just in case of future angioedema and anaphylaxis as discussed.   C/t benadryl q6 hours today and tomorrow.  Avoid cantaloupe and bananas.  In a few weeks when not taking antihistamine and not having any problems, advised f/u nurse visit to do baseline allergy panel labs.   At the end of the visit he requests Viagra.  This was an acute visit for allergic reaction today.  advised he f/u with Dr. Redmond School to discuss, and may need to discuss with Dr. Wynonia Lawman.   [name] [name] was seen today for allergic reaction.  Diagnoses and all orders for this visit:  Allergic reaction, initial encounter -     Food Allergy Panel; Future -     [facility]([place]); Future  Angioedema, initial encounter -     Food Allergy Panel; Future -     [facility]([place]); Future  Food allergy -     Food Allergy Panel; Future -     [facility]([place],,[place]); Future  Erectile dysfunction, unspecified erectile dysfunction type  Coronary artery disease involving native coronary artery of native heart without angina pectoris  Other orders -     predniSONE (DELTASONE) 10 MG tablet; 6/5/4/3/2/1 taper -     EPINEPHrine 0.3 mg/0.3 mL IJ SOAJ injection; Inject 0.3 mLs (0.3 mg total)  into the muscle once.

## 2016-01-15 ENCOUNTER — Ambulatory Visit (INDEPENDENT_AMBULATORY_CARE_PROVIDER_SITE_OTHER): Payer: BLUE CROSS/BLUE SHIELD | Admitting: Family Medicine

## 2016-01-15 ENCOUNTER — Encounter: Payer: Self-pay | Admitting: Family Medicine

## 2016-01-15 ENCOUNTER — Ambulatory Visit
Admission: RE | Admit: 2016-01-15 | Discharge: 2016-01-15 | Disposition: A | Discharge status: Home / Self Care | Payer: BLUE CROSS/BLUE SHIELD | Source: Ambulatory Visit | Attending: Family Medicine | Admitting: Family Medicine

## 2016-01-15 VITALS — BP 124/70 | HR 91 | Wt 163.0 lb

## 2016-01-15 DIAGNOSIS — Z23 Encounter for immunization: Secondary | ICD-10-CM

## 2016-01-15 DIAGNOSIS — M542 Cervicalgia: Secondary | ICD-10-CM

## 2016-01-15 NOTE — Progress Notes (Signed)
   Subjective:    Patient ID: [name] [name], male    DOB: [birth year], 40 y.o.   MRN: [id number]  HPI He is here for evaluation of continued difficulty over the last several months with neck and shoulder pain. He complains of decreased range of motion of the neck due to pain and radiation into the left shoulder. He also states he has a tingling sensation into his hands but cannot identify any particular fingers. He's noted no strength deficit, weakness or fine motor issues.   Review of Systems     Objective:   Physical Exam Pain on flexion, extension as well as rotation and limitation of lotion. Normal sensation strength and DTRs.       Assessment & Plan:  Need for prophylactic vaccination and inoculation against influenza - Plan: Flu Vaccine QUAD 36+ mos IM  Neck pain on left side - Plan: DG Cervical Spine Complete I explained that we will get x-rays first and unless there is anything of significance, will refer to physical therapy. If continued difficulty further evaluation will be needed. Flu shot also given

## 2016-01-19 ENCOUNTER — Other Ambulatory Visit: Payer: Self-pay

## 2016-01-19 DIAGNOSIS — M542 Cervicalgia: Secondary | ICD-10-CM

## 2016-01-22 ENCOUNTER — Ambulatory Visit: Payer: BLUE CROSS/BLUE SHIELD | Attending: Family Medicine | Admitting: Physical Therapy

## 2016-01-22 DIAGNOSIS — R293 Abnormal posture: Secondary | ICD-10-CM | POA: Insufficient documentation

## 2016-01-22 DIAGNOSIS — G8929 Other chronic pain: Secondary | ICD-10-CM | POA: Insufficient documentation

## 2016-01-22 DIAGNOSIS — M25512 Pain in left shoulder: Secondary | ICD-10-CM | POA: Diagnosis present

## 2016-01-22 DIAGNOSIS — M542 Cervicalgia: Secondary | ICD-10-CM | POA: Diagnosis not present

## 2016-01-22 NOTE — Patient Instructions (Signed)
   Suboccipital Stretch (Supine)    With small towel roll at base of skull and upper neck. Gently tuck chin until stretch is felt at base of skull and upper neck. Hold __5__ seconds. Relax. Repeat __10-15__ times per set. Do _1___ sets per session. Do __2-3__ sessions per day.  http://orth.exer.us/984   Copyright  VHI. All rights reserved.   Scapular Retraction (Standing)    With arms at sides, pinch shoulder blades together. Repeat __10-15__ times per set. Do __1__ sets per session. Do _2-3___ sessions per day.  http://orth.exer.us/944   Copyright  VHI. All rights reserved.

## 2016-01-22 NOTE — Therapy (Signed)
[facility] [address]  [place], [place], [address] Phone: [phone]   Fax:  [phone]  Physical Therapy Evaluation  Patient Details  Name: [name] [name] MRN: [id number] Date of Birth: 1975/12/25 Referring Provider: Dr Jill Alexanders  Encounter Date: 01/22/2016      PT End of Session - 01/22/16 1307    Visit Number 1   Number of Visits 12   Date for PT Re-Evaluation 03/04/16   Authorization - Number of Visits 30   PT Start Time 0846   PT Stop Time 0934   PT Time Calculation (min) 48 min   Activity Tolerance Patient tolerated treatment well   Behavior During Therapy [facility] for tasks assessed/performed      Past Medical History:  Diagnosis Date  . Anxiety   . CAD (coronary artery disease), native coronary artery 05/24/2014   Anterior infarction occurring out of Hospital approximately week prior to admission 3/17 16  Cardiac catheterization 3/18 showing a proximal 40% segmental LAD, 99% mid LAD with collaterals from the right coronary artery, normal circumflex, 20-30% mid and distal right coronary artery stenosis.  3.0 x 28 mm Synergy stent by Dr. Irish Lack post dilated to 3.25 mm (incidental note that LAD was closed at the time of the PCI a few minutes after the catheterization but with no symptoms)  Echo shows distal anterior apical and distal inferior severe hypokinesis with EF around 50%   . Coronary artery disease    a. 05/2014 OOH MI/PCI: LM nl, LAD 40p, 99p (3.0x28 Synergy DES) w/ R->L Collaterals, LCX nl, RCA 30p/d, EF 55% w/ sev distal inf/ant HK.  . Depression   . Erectile dysfunction   . H/O echocardiogram    a. 05/2014 Echo: Ef 45-50%, sev mid antsept/ant HK.  Marland Kitchen Hyperlipidemia   . Hypertension    medication in 2012, stress as a big component  . MI (myocardial infarction)   . 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    . Tobacco abuse     Past Surgical History:  Procedure Laterality Date   . CORONARY STENT PLACEMENT  05/24/2014   MID LAD DES   . LEFT HEART CATHETERIZATION WITH CORONARY ANGIOGRAM N/A 05/24/2014   Procedure: LEFT HEART CATHETERIZATION WITH CORONARY ANGIOGRAM;  Surgeon: Jettie Booze, MD;  Location: [facility] LAB;  Service: Cardiovascular;  Laterality: N/A;  . PERCUTANEOUS CORONARY STENT INTERVENTION (PCI-S)  05/24/2014   Procedure: PERCUTANEOUS CORONARY STENT INTERVENTION (PCI-S);  Surgeon: Jettie Booze, MD;  Location: [facility] C[facility] LAB;  Service: Cardiovascular;;  . PILONIDAL CYST EXCISION  1995    There were no vitals filed for this visit.       Subjective Assessment - 01/22/16 0848    Subjective Pt is a 40 y/o male who presents to OPPT with 6-8 month hx of L sided neck and shoulder pain.  Pt reports pain variable and radiating into LUE.     Pertinent History anxiety, depression, CAD, MI   Limitations House hold activities;Other (comment);Sitting;Lifting  sleeping   How long can you sit comfortably? "works through it"   Diagnostic tests xrays: mild disc degeneration   Patient Stated Goals improve pain   Currently in Pain? Yes   Pain Score 7   worst: 10/10; best: 3-4/10   Pain Location Scapula  neck/shoulder/scapula   Pain Orientation Left   Pain Descriptors / Indicators Tightness;Throbbing;Sharp;Tingling   Pain Type Chronic pain   Pain Onset More than a month ago  Pain Frequency Constant   Aggravating Factors  movement   Pain Relieving Factors heat            [facility] PT Assessment - 01/22/16 0852      Assessment   Medical Diagnosis L neck/shoulder pain   Referring Provider Dr Jill Alexanders   Onset Date/Surgical Date --  Spring 2017   Hand Dominance Left   Next MD Visit not scheduled; will follow up after PT   Prior Therapy none     Precautions   Precautions None     Restrictions   Weight Bearing Restrictions No     Balance Screen   Has the patient fallen in the past 6 months No   Has the patient had a decrease in activity  level because of a fear of falling?  No   Is the patient reluctant to leave their home because of a fear of falling?  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, work day variable between sitting/standing   Leisure travel, outdoors, hang out with friends     Cognition   Overall Cognitive Status Within Functional Limits for tasks assessed     Observation/Other Assessments   Focus on Therapeutic Outcomes (FOTO)  42 (58% limited; predicted 38% limited)     Posture/Postural Control   Posture/Postural Control Postural limitations   Postural Limitations Rounded Shoulders;Forward head     AROM   Overall AROM Comments bil shoulders WNL with pain; pain with all cervical testing   AROM Assessment Site Cervical   Right/Left Shoulder --   Cervical Flexion 11   Cervical Extension 15   Cervical - Right Side Bend 2   Cervical - Left Side Bend 21   Cervical - Right Rotation 56   Cervical - Left Rotation 46     Strength   Strength Assessment Site Shoulder;Elbow;Hand   Right/Left Shoulder Right;Left   Right Shoulder Flexion 5/5   Right Shoulder ABduction 5/5   Right Shoulder Internal Rotation 5/5   Right Shoulder External Rotation 4/5   Left Shoulder Flexion 5/5   Left Shoulder ABduction 4/5   Left Shoulder Internal Rotation 5/5   Left Shoulder External Rotation 3+/5   Right/Left Elbow Right;Left   Right Elbow Flexion 5/5   Right Elbow Extension 4/5   Left Elbow Flexion 5/5   Left Elbow Extension 4/5   Right Hand Grip (lbs) 44  46, 41, 45   Left Hand Grip (lbs) 43.67  47, 40, 44     Palpation   Palpation comment tightness and tenderness and along bil UT and cervical muscles     Special Tests    Special Tests Cervical   Cervical Tests  Spurling's;Dictraction     Spurling's   Findings Negative   Comment no change     Distraction Test   Findngs Negative   Comment no change                   [facility] Adult PT Treatment/Exercise - 01/22/16 0852      Exercises   Exercises Neck     Neck Exercises: Seated   Other Seated Exercise scap retraction 10x5 sec     Neck Exercises: Supine   Neck Retraction  10 reps;5 secs     Modalities   Modalities Moist Heat;Electrical Stimulation     Moist Heat Therapy   Number Minutes Moist Heat 15 Minutes   Moist Heat Location Cervical;Shoulder     Electrical Stimulation   Electrical Stimulation Location L scapula/neck   Electrical Stimulation Action IFC   Electrical Stimulation Parameters to tolerance x 15 min   Electrical Stimulation Goals Tone;Pain                PT Education - 01/22/16 [phone]    Education provided Yes   Education Details HEP, clinical findings, POC   Person(s) Educated Patient   Methods Explanation;Demonstration;Handout   Comprehension Verbalized understanding;Returned demonstration             PT Long Term Goals - 01/22/16 1312      PT LONG TERM GOAL #1   Title independent with HEP (03/04/16)   Time 6   Period Weeks   Status New     PT LONG TERM GOAL #2   Title improve cervical ROM by 10 degrees each motion for improved function (03/04/16)   Time 6   Period Weeks   Status New     PT LONG TERM GOAL #3   Title tolerate full work day with pain < 5/10 for improved function (03/04/16)   Time 6   Period Weeks   Status New     PT LONG TERM GOAL #4   Title verbalize understanding of posture/body mechanics to decrease risk of reinjury (03/04/16)   Time 6   Period Weeks   Status New               Plan - 01/22/16 1308    Clinical Impression Statement Pt is a 41 y/o male who presents to OPPT for moderate complexity PT eval for L sided neck and shoulder pain.  Pt demonstrated increased muscle tightness, decreased ROM,  pain, decreased strength and postural abnormalities all affecting function and ADLs.  Will benefit from PT to address deficits listed.   Rehab Potential Good   PT Frequency 2x / week   PT Duration 6 weeks   PT Treatment/Interventions ADLs/Self Care Home Management;Cryotherapy;Electrical Stimulation;Moist Heat;Traction;Ultrasound;Neuromuscular re-education;Therapeutic activities;Therapeutic exercise;Patient/family education;Manual techniques;Taping;Dry needling   PT Next Visit Plan assess if TDN would be beneficial, review HEP and add flexibility and strengthening, postural awareness   Consulted and Agree with Plan of Care Patient      Patient will benefit from skilled therapeutic intervention in order to improve the following deficits and impairments:  Pain, Impaired UE functional use, Decreased strength, Decreased range of motion, Impaired flexibility, Postural dysfunction, Increased muscle spasms  Visit Diagnosis: Cervicalgia - Plan: PT plan of care cert/re-cert  Chronic left shoulder pain - Plan: PT plan of care cert/re-cert  Abnormal posture - Plan: PT plan of care cert/re-cert     Problem List Patient Active Problem List   Diagnosis Date Noted  . Allergic reaction 12/24/2015  . Angio-edema 12/24/2015  . Food allergy 12/24/2015  . Hyperlipidemia   . Hypertension   . CAD (coronary artery disease), native coronary artery 05/24/2014  . Anxiety and depression 05/08/2014  . Family history of premature CAD 05/08/2014  . Secondary syphilis   . High risk sexual behavior   . Tobacco use disorder        Laureen Abrahams, PT, DPT 01/22/16 1:19 PM    [facility] [address]  [address] [place], [place],  [place] Phone: [phone]   Fax:  [phone]  Name: [name] [name] MRN: [id number] Date of Birth: Mar 03, 1976

## 2016-01-27 ENCOUNTER — Ambulatory Visit: Payer: BLUE CROSS/BLUE SHIELD | Admitting: Physical Therapy

## 2016-01-27 DIAGNOSIS — M542 Cervicalgia: Secondary | ICD-10-CM

## 2016-01-27 DIAGNOSIS — M25512 Pain in left shoulder: Secondary | ICD-10-CM

## 2016-01-27 DIAGNOSIS — R293 Abnormal posture: Secondary | ICD-10-CM

## 2016-01-27 DIAGNOSIS — G8929 Other chronic pain: Secondary | ICD-10-CM

## 2016-01-27 NOTE — Therapy (Signed)
[place], [place], [address] Phone: [phone]   Fax:  [phone]  Physical Therapy Treatment  Patient Details  Name: [name] [name] MRN: [id number] Date of Birth: 1976-02-26 Referring Provider: Dr Jill Alexanders  Encounter Date: 01/27/2016      PT End of Session - 01/27/16 0801    Visit Number 2   Number of Visits 12   Date for PT Re-Evaluation 03/04/16   PT Start Time 0802   PT Stop Time 0856   PT Time Calculation (min) 54 min   Activity Tolerance Patient tolerated treatment well   Behavior During Therapy [facility] for tasks assessed/performed      Past Medical History:  Diagnosis Date  . Anxiety   . CAD (coronary artery disease), native coronary artery 05/24/2014   Anterior infarction occurring out of Hospital approximately week prior to admission 3/17 16  Cardiac catheterization 3/18 showing a proximal 40% segmental LAD, 99% mid LAD with collaterals from the right coronary artery, normal circumflex, 20-30% mid and distal right coronary artery stenosis.  3.0 x 28 mm Synergy stent by Dr. Irish Lack post dilated to 3.25 mm (incidental note that LAD was closed at the time of the PCI a few minutes after the catheterization but with no symptoms)  Echo shows distal anterior apical and distal inferior severe hypokinesis with EF around 50%   . Coronary artery disease    a. 05/2014 OOH MI/PCI: LM nl, LAD 40p, 99p (3.0x28 Synergy DES) w/ R->L Collaterals, LCX nl, RCA 30p/d, EF 55% w/ sev distal inf/ant HK.  . Depression   . Erectile dysfunction   . H/O echocardiogram    a. 05/2014 Echo: Ef 45-50%, sev mid antsept/ant HK.  Marland Kitchen Hyperlipidemia   . Hypertension    medication in 2012, stress as a big component  . MI (myocardial infarction)   . 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    . Tobacco abuse     Past Surgical History:  Procedure Laterality Date  . CORONARY STENT PLACEMENT  05/24/2014   MID LAD  DES   . LEFT HEART CATHETERIZATION WITH CORONARY ANGIOGRAM N/A 05/24/2014   Procedure: LEFT HEART CATHETERIZATION WITH CORONARY ANGIOGRAM;  Surgeon: Jettie Booze, MD;  Location: [facility] CATH LAB;  Service: Cardiovascular;  Laterality: N/A;  . PERCUTANEOUS CORONARY STENT INTERVENTION (PCI-S)  05/24/2014   Procedure: PERCUTANEOUS CORONARY STENT INTERVENTION (PCI-S);  Surgeon: Jettie Booze, MD;  Location: [facility] CATH LAB;  Service: Cardiovascular;;  . PILONIDAL CYST EXCISION  1995    There were no vitals filed for this visit.      Subjective Assessment - 01/27/16 0803    Subjective My pain is pretty much the same. Still tight around the neck and the scapula.  I just cant massage or rub it out.   Pertinent History anxiety, depression, CAD, MI   Limitations House hold activities;Other (comment);Sitting;Lifting   How long can you sit comfortably? "works through it"   Diagnostic tests xrays: mild disc degeneration   Patient Stated Goals improve pain   Currently in Pain? Yes   Pain Score 6    Pain Location Scapula  neck shoulder scapula   Pain Orientation Left   Pain Descriptors / Indicators Throbbing;Sharp;Tightness   Pain Type Chronic pain   Pain Onset More than a month ago   Pain Frequency Constant            [facility] PT Assessment - 01/27/16 0001  AROM   Cervical - Right Rotation 61                     [facility] Adult PT Treatment/Exercise - 01/27/16 0811      Modalities   Modalities Moist Heat     Moist Heat Therapy   Number Minutes Moist Heat 15 Minutes   Moist Heat Location Cervical;Shoulder     Manual Therapy   Manual Therapy Joint mobilization;Soft tissue mobilization   Manual therapy comments trigger point on left scalene, upper trap  sub scap and levator   Joint Mobilization prone C-3, c-4, c 5 lateral UPA  marked stiffness   Soft tissue mobilization IASTYM tool left upper trap , levator, supscapularis, C -3, C-4 left erectore spinae     Neck  Exercises: Stretches   Upper Trapezius Stretch 30 seconds;3 reps  left   Levator Stretch 30 seconds;3 reps   Other Neck Stretches neck retraction and shoulder shrug 10 x          Trigger Point Dry Needling - 01/27/16 0813    Consent Given? Yes   Education Handout Provided Yes   Muscles Treated Upper Body Upper trapezius;Levator scapulae;Rhomboids;Subscapularis  left side only fot TDN   Upper Trapezius Response Twitch reponse elicited;Palpable increased muscle length   Levator Scapulae Response Twitch response elicited;Palpable increased muscle length   Rhomboids Response Twitch response elicited;Palpable increased muscle length   Subscapularis Response Twitch response elicited;Palpable increased muscle length              PT Education - 01/27/16 0810    Education provided Yes   Education Details added upper trap and levator stretch , trigger point dry needling   Person(s) Educated Patient   Methods Explanation;Demonstration;Verbal cues;Handout   Comprehension Verbalized understanding;Returned demonstration             PT Long Term Goals - 01/27/16 0814      PT LONG TERM GOAL #1   Title independent with HEP (03/04/16)   Period Weeks   Status On-going     PT LONG TERM GOAL #2   Title improve cervical ROM by 10 degrees each motion for improved function (03/04/16)   Period Weeks   Status On-going     PT LONG TERM GOAL #3   Title tolerate full work day with pain < 5/10 for improved function (03/04/16)   Time 6   Period Weeks   Status On-going     PT LONG TERM GOAL #4   Title verbalize understanding of posture/body mechanics to decrease risk of reinjury (03/04/16)   Time 6   Period Weeks   Status On-going               Plan - 01/27/16 6568    Clinical Impression Statement Pt returns for 2nd visit.  Pt was educated on trigger point dry needling and consented to treatment.  Pt was closely monitored throughout session.  Pt with improved right  rotation and had multiple localized twitch responses in Upper trap and levator.  Pt with decreased hypertonicity in left neck musculature after treatment   Rehab Potential Good   PT Frequency 2x / week   PT Duration 6 weeks   PT Treatment/Interventions ADLs/Self Care Home Management;Cryotherapy;Electrical Stimulation;Moist Heat;Traction;Ultrasound;Neuromuscular re-education;Therapeutic activities;Therapeutic exercise;Patient/family education;Manual techniques;Taping;Dry needling   PT Next Visit Plan Assess TDN treatment continue with scap stabilizer strenght   PT Home Exercise Plan upper trap, levator, neck retraction and heat to left neck several times a  day.   Consulted and Agree with Plan of Care Patient      Patient will benefit from skilled therapeutic intervention in order to improve the following deficits and impairments:  Pain, Impaired UE functional use, Decreased strength, Decreased range of motion, Impaired flexibility, Postural dysfunction, Increased muscle spasms  Visit Diagnosis: Cervicalgia  Chronic left shoulder pain  Abnormal posture     Problem List Patient Active Problem List   Diagnosis Date Noted  . Allergic reaction 12/24/2015  . Angio-edema 12/24/2015  . Food allergy 12/24/2015  . Hyperlipidemia   . Hypertension   . CAD (coronary artery disease), native coronary artery 05/24/2014  . Anxiety and depression 05/08/2014  . Family history of premature CAD 05/08/2014  . Secondary syphilis   . High risk sexual behavior   . Tobacco use disorder    Voncille Lo, PT Exercise Expert for the Aging Adult  01/27/16 8:49 AM Phone: [phone] Fax: [facility] [address] [place], [place], [address] Phone: [phone]   Fax:  [phone]  Name: [name] [name] MRN: [id number] Date of Birth: 1975/05/11

## 2016-01-27 NOTE — Patient Instructions (Signed)
  Trigger Point Dry Needling  . What is Trigger Point Dry Needling (DN)? o DN is a physical therapy technique used to treat muscle pain and dysfunction. Specifically, DN helps deactivate muscle trigger points (muscle knots).  o A thin filiform needle is used to penetrate the skin and stimulate the underlying trigger point. The goal is for a local twitch response (LTR) to occur and for the trigger point to relax. No medication of any kind is injected during the procedure.   . What Does Trigger Point Dry Needling Feel Like?  o The procedure feels different for each individual patient. Some patients report that they do not actually feel the needle enter the skin and overall the process is not painful. Very mild bleeding may occur. However, many patients feel a deep cramping in the muscle in which the needle was inserted. This is the local twitch response.   Marland Kitchen How Will I feel after the treatment? o Soreness is normal, and the onset of soreness may not occur for a few hours. Typically this soreness does not last longer than two days.  o Bruising is uncommon, however; ice can be used to decrease any possible bruising.  o In rare cases feeling tired or nauseous after the treatment is normal. In addition, your symptoms may get worse before they get better, this period will typically not last longer than 24 hours.   . What Can I do After My Treatment? o Increase your hydration by drinking more water for the next 24 hours. o You may place ice or heat on the areas treated that have become sore, however, do not use heat on inflamed or bruised areas. Heat often brings more relief post needling. o You can continue your regular activities, but vigorous activity is not recommended initially after the treatment for 24 hours. o DN is best combined with other physical therapy such as strengthening, stretching, and other therapies.   Voncille Lo, PT Exercise Expert for the Aging Adult  01/27/16 8:10  AM Phone: [phone] Fax: [phone]

## 2016-02-05 ENCOUNTER — Ambulatory Visit: Payer: BLUE CROSS/BLUE SHIELD | Admitting: Physical Therapy

## 2016-02-05 DIAGNOSIS — R293 Abnormal posture: Secondary | ICD-10-CM

## 2016-02-05 DIAGNOSIS — G8929 Other chronic pain: Secondary | ICD-10-CM

## 2016-02-05 DIAGNOSIS — M25512 Pain in left shoulder: Secondary | ICD-10-CM

## 2016-02-05 DIAGNOSIS — M542 Cervicalgia: Secondary | ICD-10-CM

## 2016-02-05 NOTE — Therapy (Signed)
[place], [place], [address] Phone: [phone]   Fax:  [phone]  Physical Therapy Treatment  Patient Details  Name: [name] [name] MRN: [id number] Date of Birth: 02-Jan-1976 Referring Provider: Dr Jill Alexanders  Encounter Date: 02/05/2016      PT End of Session - 02/05/16 0854    Visit Number 3   Number of Visits 12   Date for PT Re-Evaluation 03/04/16   PT Start Time 0854   PT Stop Time 0945   PT Time Calculation (min) 51 min   Activity Tolerance Patient tolerated treatment well   Behavior During Therapy [facility] for tasks assessed/performed      Past Medical History:  Diagnosis Date  . Anxiety   . CAD (coronary artery disease), native coronary artery 05/24/2014   Anterior infarction occurring out of Hospital approximately week prior to admission 3/17 16  Cardiac catheterization 3/18 showing a proximal 40% segmental LAD, 99% mid LAD with collaterals from the right coronary artery, normal circumflex, 20-30% mid and distal right coronary artery stenosis.  3.0 x 28 mm Synergy stent by Dr. Irish Lack post dilated to 3.25 mm (incidental note that LAD was closed at the time of the PCI a few minutes after the catheterization but with no symptoms)  Echo shows distal anterior apical and distal inferior severe hypokinesis with EF around 50%   . Coronary artery disease    a. 05/2014 OOH MI/PCI: LM nl, LAD 40p, 99p (3.0x28 Synergy DES) w/ R->L Collaterals, LCX nl, RCA 30p/d, EF 55% w/ sev distal inf/ant HK.  . Depression   . Erectile dysfunction   . H/O echocardiogram    a. 05/2014 Echo: Ef 45-50%, sev mid antsept/ant HK.  Marland Kitchen Hyperlipidemia   . Hypertension    medication in 2012, stress as a big component  . MI (myocardial infarction)   . 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    . Tobacco abuse     Past Surgical History:  Procedure Laterality Date  . CORONARY STENT PLACEMENT  05/24/2014   MID LAD  DES   . LEFT HEART CATHETERIZATION WITH CORONARY ANGIOGRAM N/A 05/24/2014   Procedure: LEFT HEART CATHETERIZATION WITH CORONARY ANGIOGRAM;  Surgeon: Jettie Booze, MD;  Location: [facility] CATH LAB;  Service: Cardiovascular;  Laterality: N/A;  . PERCUTANEOUS CORONARY STENT INTERVENTION (PCI-S)  05/24/2014   Procedure: PERCUTANEOUS CORONARY STENT INTERVENTION (PCI-S);  Surgeon: Jettie Booze, MD;  Location: [facility] CATH LAB;  Service: Cardiovascular;;  . PILONIDAL CYST EXCISION  1995    There were no vitals filed for this visit.      Subjective Assessment - 02/05/16 0857    Subjective I am still sore but I can move better today.  Still with pain in lower arm (forearm extensors) and left shoulder   Pertinent History anxiety, depression, CAD, MI   Limitations House hold activities;Other (comment);Sitting;Lifting   How long can you sit comfortably? "works through it"   Diagnostic tests xrays: mild disc degeneration   Patient Stated Goals improve pain   Currently in Pain? Yes   Pain Score 7    Pain Location Scapula   Pain Orientation Left   Pain Descriptors / Indicators Throbbing;Sharp;Tightness   Pain Type Chronic pain   Pain Onset More than a month ago   Pain Frequency Constant                         [facility] Adult PT Treatment/Exercise -  02/05/16 0914      Neck Exercises: Seated   Other Seated Exercise median nerve and radial nerve neural stretch  x 10 each      Neck Exercises: Supine   Other Supine Exercise cervical chin tuck with towel roll under neck 10 x 5 sec hold     Neck Exercises: Prone   Other Prone Exercise neck prone chin tuck 10x for 5 second hold     Modalities   Modalities Moist Heat     Moist Heat Therapy   Number Minutes Moist Heat 15 Minutes   Moist Heat Location Cervical     Ultrasound   Ultrasound Location lateral epicondyle on left arm   Ultrasound Parameters 1.2 w/cm2 for 8 min   Ultrasound Goals Pain     Manual Therapy   Manual  Therapy Joint mobilization;Soft tissue mobilization   Manual therapy comments trigger point on left scalene, upper trap  sub scap and levator   Joint Mobilization prone C-3, c-4, c 5 lateral UPA   Soft tissue mobilization IASTYM tool left upper trap , levator, supscapularis, C -3, C-4 left erectore spinae and left common extensor tendons of left arm     Neck Exercises: Stretches   Upper Trapezius Stretch 2 reps;30 seconds  left side   Levator Stretch 2 reps;30 seconds  left side   Other Neck Stretches neck retraction and PT assisting with distraction which brought relief of pain radiating down arm          Trigger Point Dry Needling - 02/05/16 0900    Consent Given? Yes   Education Handout Provided No  previously given   Muscles Treated Upper Body Infraspinatus;Upper trapezius;Subscapularis;Levator scapulae;Pectoralis major;Pectoralis minor                   PT Long Term Goals - 01/27/16 0814      PT LONG TERM GOAL #1   Title independent with HEP (03/04/16)   Period Weeks   Status On-going     PT LONG TERM GOAL #2   Title improve cervical ROM by 10 degrees each motion for improved function (03/04/16)   Period Weeks   Status On-going     PT LONG TERM GOAL #3   Title tolerate full work day with pain < 5/10 for improved function (03/04/16)   Time 6   Period Weeks   Status On-going     PT LONG TERM GOAL #4   Title verbalize understanding of posture/body mechanics to decrease risk of reinjury (03/04/16)   Time 6   Period Weeks   Status On-going               Plan - 02/05/16 0929    Clinical Impression Statement PT returns for 3rd visit and 2nd trigger point dry needling.  Pt c/o pain 7/10 but he is most distrubed by pain in left arm common extensors and he indicates pain source at lateral epicondyle.  Pt complains of tightness in left periscapular muscles as well as upper trap and levator. Pt consented to trigger point dry needling and was closely  monitored throughout session.  Pt did recieve some meausure of relief with distraction manually by PT so we will have trial of traction for next visit   Rehab Potential Good   PT Frequency 2x / week   PT Duration 6 weeks   PT Treatment/Interventions ADLs/Self Care Home Management;Cryotherapy;Electrical Stimulation;Moist Heat;Traction;Ultrasound;Neuromuscular re-education;Therapeutic activities;Therapeutic exercise;Patient/family education;Manual techniques;Taping;Dry needling   PT Next Visit Plan Assess TDN  treatment continue with scap stabilizer strength and add to HEP assess goals   PT Home Exercise Plan upper trap, levator, neck retraction and heat to left neck several times a day. prone neck retraction/ supine deep neck flexor stretch   Consulted and Agree with Plan of Care Patient      Patient will benefit from skilled therapeutic intervention in order to improve the following deficits and impairments:  Pain, Impaired UE functional use, Decreased strength, Decreased range of motion, Impaired flexibility, Postural dysfunction, Increased muscle spasms  Visit Diagnosis: Cervicalgia  Chronic left shoulder pain  Abnormal posture     Problem List Patient Active Problem List   Diagnosis Date Noted  . Allergic reaction 12/24/2015  . Angio-edema 12/24/2015  . Food allergy 12/24/2015  . Hyperlipidemia   . Hypertension   . CAD (coronary artery disease), native coronary artery 05/24/2014  . Anxiety and depression 05/08/2014  . Family history of premature CAD 05/08/2014  . Secondary syphilis   . High risk sexual behavior   . Tobacco use disorder     Dorothea Ogle 02/05/2016, [facility] [address] [place], [place], [address] Phone: [phone]   Fax:  [phone]  Name: Zacharie Portner MRN: [id number] Date of Birth: Nov 22, 1975

## 2016-02-05 NOTE — Patient Instructions (Signed)
  Gave median and radial neural glides as well not to be more than 10 reps in 1 session.  Once a day Voncille Lo, PT Exercise Expert for the Aging Adult  02/05/16 9:32 AM Phone: [phone] Fax: [phone]

## 2016-02-06 ENCOUNTER — Ambulatory Visit: Payer: BLUE CROSS/BLUE SHIELD | Attending: Family Medicine | Admitting: Physical Therapy

## 2016-02-06 DIAGNOSIS — G8929 Other chronic pain: Secondary | ICD-10-CM | POA: Diagnosis present

## 2016-02-06 DIAGNOSIS — R293 Abnormal posture: Secondary | ICD-10-CM | POA: Diagnosis present

## 2016-02-06 DIAGNOSIS — M25512 Pain in left shoulder: Secondary | ICD-10-CM | POA: Diagnosis present

## 2016-02-06 DIAGNOSIS — M542 Cervicalgia: Secondary | ICD-10-CM | POA: Diagnosis not present

## 2016-02-06 NOTE — Patient Instructions (Addendum)
Resistive Band Rowing   With resistive band anchored in door, grasp both ends. Keeping elbows bent, pull back, squeezing shoulder blades together. Hold __5__ seconds. Repeat __10-20__ times. Do __2__ sessions per day.   EXTENSION: Standing - Resistance Band: Stable (Active)   Stand, right arm at side. Against yellow resistance band, draw arm backward, as far as possible, keeping elbow straight. Complete _2__ sets of __10_ repetitions. Perform __2_ sessions per day.

## 2016-02-06 NOTE — Therapy (Signed)
[name], [place], [address] Phone: [phone]   Fax:  [phone]  Physical Therapy Treatment  Patient Details  Name: [name] [name] MRN: [id number] Date of Birth: 12-Apr-1975 Referring Provider: Dr Jill Alexanders  Encounter Date: 02/06/2016      PT End of Session - 02/06/16 0838    Visit Number 4   Number of Visits 12   Date for PT Re-Evaluation 03/04/16   PT Start Time 0800   PT Stop Time 0838   PT Time Calculation (min) 38 min      Past Medical History:  Diagnosis Date  . Anxiety   . CAD (coronary artery disease), native coronary artery 05/24/2014   Anterior infarction occurring out of Hospital approximately week prior to admission 3/17 16  Cardiac catheterization 3/18 showing a proximal 40% segmental LAD, 99% mid LAD with collaterals from the right coronary artery, normal circumflex, 20-30% mid and distal right coronary artery stenosis.  3.0 x 28 mm Synergy stent by Dr. Irish Lack post dilated to 3.25 mm (incidental note that LAD was closed at the time of the PCI a few minutes after the catheterization but with no symptoms)  Echo shows distal anterior apical and distal inferior severe hypokinesis with EF around 50%   . Coronary artery disease    a. 05/2014 OOH MI/PCI: LM nl, LAD 40p, 99p (3.0x28 Synergy DES) w/ R->L Collaterals, LCX nl, RCA 30p/d, EF 55% w/ sev distal inf/ant HK.  . Depression   . Erectile dysfunction   . H/O echocardiogram    a. 05/2014 Echo: Ef 45-50%, sev mid antsept/ant HK.  Marland Kitchen Hyperlipidemia   . Hypertension    medication in 2012, stress as a big component  . MI (myocardial infarction)   . 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    . Tobacco abuse     Past Surgical History:  Procedure Laterality Date  . CORONARY STENT PLACEMENT  05/24/2014   MID LAD DES   . LEFT HEART CATHETERIZATION WITH CORONARY ANGIOGRAM N/A 05/24/2014   Procedure: LEFT HEART CATHETERIZATION  WITH CORONARY ANGIOGRAM;  Surgeon: Jettie Booze, MD;  Location: [facility] CATH LAB;  Service: Cardiovascular;  Laterality: N/A;  . PERCUTANEOUS CORONARY STENT INTERVENTION (PCI-S)  05/24/2014   Procedure: PERCUTANEOUS CORONARY STENT INTERVENTION (PCI-S);  Surgeon: Jettie Booze, MD;  Location: [facility] CATH LAB;  Service: Cardiovascular;;  . PILONIDAL CYST EXCISION  1995    There were no vitals filed for this visit.      Subjective Assessment - 02/06/16 0802    Subjective Alot more movement. Getting better.    Currently in Pain? Yes   Pain Score 3    Pain Location Scapula   Pain Orientation Left   Pain Descriptors / Indicators Tightness                         [facility] Adult PT Treatment/Exercise - 02/06/16 0001      Neck Exercises: Theraband   Shoulder Extension 20 reps;Red  instructed in use of doorway as well for HEP    Shoulder Extension Limitations cues for technique/posture   Rows 20 reps;Red  instructed in use of doorway as well for HEP   Rows Limitations cues for technique/posture      Modalities   Modalities Traction     Traction   Type of Traction Cervical   Min (lbs) 15   Max (lbs) 7   Hold Time 60  Rest Time 10   Time 15               PT Education - 02/06/16 0826    Education provided Yes   Education Details HEP   Person(s) Educated Patient   Methods Explanation;Handout   Comprehension Verbalized understanding             PT Long Term Goals - 01/27/16 0814      PT LONG TERM GOAL #1   Title independent with HEP (03/04/16)   Period Weeks   Status On-going     PT LONG TERM GOAL #2   Title improve cervical ROM by 10 degrees each motion for improved function (03/04/16)   Period Weeks   Status On-going     PT LONG TERM GOAL #3   Title tolerate full work day with pain < 5/10 for improved function (03/04/16)   Time 6   Period Weeks   Status On-going     PT LONG TERM GOAL #4   Title verbalize understanding of  posture/body mechanics to decrease risk of reinjury (03/04/16)   Time 6   Period Weeks   Status On-going               Plan - 02/06/16 0806    Clinical Impression Statement Pt reports first night last night of sleeping all night without walking. Forearm pain almost resolved since yesterday treatment. Still just a little tight around left scapula. Began scapular retraction exercises with resistance today, tolerated well. Added to HEP. Trial of cervical traction per PT plan. No change post treatment.     PT Next Visit Plan Assess cervical traction continue  TDN treatment continue with scap stabilizer strength and review theraband HEP    PT Home Exercise Plan upper trap, levator, neck retraction and heat to left neck several times a day. prone neck retraction/ supine deep neck flexor stretch, standing row and shoulder extension with red band    Consulted and Agree with Plan of Care Patient      Patient will benefit from skilled therapeutic intervention in order to improve the following deficits and impairments:  Pain, Impaired UE functional use, Decreased strength, Decreased range of motion, Impaired flexibility, Postural dysfunction, Increased muscle spasms  Visit Diagnosis: Cervicalgia  Chronic left shoulder pain  Abnormal posture     Problem List Patient Active Problem List   Diagnosis Date Noted  . Allergic reaction 12/24/2015  . Angio-edema 12/24/2015  . Food allergy 12/24/2015  . Hyperlipidemia   . Hypertension   . CAD (coronary artery disease), native coronary artery 05/24/2014  . Anxiety and depression 05/08/2014  . Family history of premature CAD 05/08/2014  . Secondary syphilis   . High risk sexual behavior   . Tobacco use disorder     [name] [name], [place] 02/06/2016, 8:42 AM  [facility] [address] [place], [place], [address] Phone: [phone]   Fax:  [phone]  Name: [name] [name] MRN:  [id number] Date of Birth: 1976/01/07

## 2016-02-10 ENCOUNTER — Ambulatory Visit: Payer: BLUE CROSS/BLUE SHIELD | Admitting: Physical Therapy

## 2016-02-10 DIAGNOSIS — M542 Cervicalgia: Secondary | ICD-10-CM | POA: Diagnosis not present

## 2016-02-10 DIAGNOSIS — M25512 Pain in left shoulder: Secondary | ICD-10-CM

## 2016-02-10 DIAGNOSIS — R293 Abnormal posture: Secondary | ICD-10-CM

## 2016-02-10 DIAGNOSIS — G8929 Other chronic pain: Secondary | ICD-10-CM

## 2016-02-10 NOTE — Therapy (Signed)
[place], [place], [address] Phone: [phone]   Fax:  [phone]  Physical Therapy Treatment  Patient Details  Name: [name] [name] MRN: [id number] Date of Birth: 1975-06-09 Referring Provider: Dr Jill Alexanders  Encounter Date: 02/10/2016      PT End of Session - 02/10/16 1159    Visit Number 5   Number of Visits 12   Date for PT Re-Evaluation 03/04/16   PT Start Time 1017   PT Stop Time 1107   PT Time Calculation (min) 50 min   Activity Tolerance Patient tolerated treatment well      Past Medical History:  Diagnosis Date  . Anxiety   . CAD (coronary artery disease), native coronary artery 05/24/2014   Anterior infarction occurring out of Hospital approximately week prior to admission 3/17 16  Cardiac catheterization 3/18 showing a proximal 40% segmental LAD, 99% mid LAD with collaterals from the right coronary artery, normal circumflex, 20-30% mid and distal right coronary artery stenosis.  3.0 x 28 mm Synergy stent by Dr. Irish Lack post dilated to 3.25 mm (incidental note that LAD was closed at the time of the PCI a few minutes after the catheterization but with no symptoms)  Echo shows distal anterior apical and distal inferior severe hypokinesis with EF around 50%   . Coronary artery disease    a. 05/2014 OOH MI/PCI: LM nl, LAD 40p, 99p (3.0x28 Synergy DES) w/ R->L Collaterals, LCX nl, RCA 30p/d, EF 55% w/ sev distal inf/ant HK.  . Depression   . Erectile dysfunction   . H/O echocardiogram    a. 05/2014 Echo: Ef 45-50%, sev mid antsept/ant HK.  Marland Kitchen Hyperlipidemia   . Hypertension    medication in 2012, stress as a big component  . MI (myocardial infarction)   . 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    . Tobacco abuse     Past Surgical History:  Procedure Laterality Date  . CORONARY STENT PLACEMENT  05/24/2014   MID LAD DES   . LEFT HEART CATHETERIZATION WITH CORONARY ANGIOGRAM  N/A 05/24/2014   Procedure: LEFT HEART CATHETERIZATION WITH CORONARY ANGIOGRAM;  Surgeon: Jettie Booze, MD;  Location: [facility] CATH LAB;  Service: Cardiovascular;  Laterality: N/A;  . PERCUTANEOUS CORONARY STENT INTERVENTION (PCI-S)  05/24/2014   Procedure: PERCUTANEOUS CORONARY STENT INTERVENTION (PCI-S);  Surgeon: Jettie Booze, MD;  Location: [facility] CATH LAB;  Service: Cardiovascular;;  . PILONIDAL CYST EXCISION  1995    There were no vitals filed for this visit.      Subjective Assessment - 02/10/16 1020    Subjective "still having some tightness in the shoulder/ neck, with referred pain in the forearm, but the needling has really"    Currently in Pain? Yes   Pain Score 4    Pain Location Scapula   Pain Orientation Left   Pain Descriptors / Indicators Aching;Tightness   Pain Type Chronic pain   Pain Frequency Constant   Aggravating Factors  Work,    Pain Relieving Factors DN, heat, stretching                         [facility] Adult PT Treatment/Exercise - 02/10/16 1051      Neck Exercises: Machines for Strengthening   UBE (Upper Arm Bike) L1.5 x 6 min  changing direction at 3 min     Moist Heat Therapy   Number Minutes Moist Heat 10 Minutes  Moist Heat Location Cervical     Manual Therapy   Soft tissue mobilization IASTM over the L upper trap, levator scapulae and medial border of the Scapula     Neck Exercises: Stretches   Upper Trapezius Stretch 2 reps;30 seconds   Levator Stretch 2 reps;30 seconds   Other Neck Stretches Rhomboid stretch from door 2 x 30 sec hold          Trigger Point Dry Needling - 02/10/16 1050    Consent Given? Yes   Education Handout Provided Yes  given previously   Muscles Treated Upper Body --  latissmus Doris    Upper Trapezius Response Twitch reponse elicited;Palpable increased muscle length   Rhomboids Response Twitch response elicited;Palpable increased muscle length   Subscapularis Response Twitch response  elicited;Palpable increased muscle length                   PT Long Term Goals - 01/27/16 0814      PT LONG TERM GOAL #1   Title independent with HEP (03/04/16)   Period Weeks   Status On-going     PT LONG TERM GOAL #2   Title improve cervical ROM by 10 degrees each motion for improved function (03/04/16)   Period Weeks   Status On-going     PT LONG TERM GOAL #3   Title tolerate full work day with pain < 5/10 for improved function (03/04/16)   Time 6   Period Weeks   Status On-going     PT LONG TERM GOAL #4   Title verbalize understanding of posture/body mechanics to decrease risk of reinjury (03/04/16)   Time 6   Period Weeks   Status On-going               Plan - 02/10/16 1154    Clinical Impression Statement pt reports continued pain in the L upper trap and surrounding musculator. DN was peformed on the L upper trap, sub-scapularis and latissmus Dorsi. followed TPDN with soft tissue work. He was able to do all exercises with repor tof decreased pain. Utilized MHP to calm down muscle soreness from todays session which he reported 1/10 post session.    PT Next Visit Plan assess response to TDN treatment continue with scap stabilizer strength and review theraband HEP    PT Home Exercise Plan upper trap, levator, neck retraction and heat to left neck several times a day. prone neck retraction/ supine deep neck flexor stretch, standing row and shoulder extension with red band    Consulted and Agree with Plan of Care Patient      Patient will benefit from skilled therapeutic intervention in order to improve the following deficits and impairments:  Pain, Impaired UE functional use, Decreased strength, Decreased range of motion, Impaired flexibility, Postural dysfunction, Increased muscle spasms  Visit Diagnosis: Cervicalgia  Chronic left shoulder pain  Abnormal posture     Problem List Patient Active Problem List   Diagnosis Date Noted  . Allergic  reaction 12/24/2015  . Angio-edema 12/24/2015  . Food allergy 12/24/2015  . Hyperlipidemia   . Hypertension   . CAD (coronary artery disease), native coronary artery 05/24/2014  . Anxiety and depression 05/08/2014  . Family history of premature CAD 05/08/2014  . Secondary syphilis   . High risk sexual behavior   . Tobacco use disorder    Starr Lake PT, DPT, LAT, ATC  02/10/16  12:00 PM      [facility] [facility] [phone]  [place], [place], [address] Phone: [phone]   Fax:  [phone]  Name: [name] [name] MRN: [id number] Date of Birth: 03/19/1975

## 2016-02-12 ENCOUNTER — Ambulatory Visit: Payer: BLUE CROSS/BLUE SHIELD | Admitting: Physical Therapy

## 2016-02-12 DIAGNOSIS — R293 Abnormal posture: Secondary | ICD-10-CM

## 2016-02-12 DIAGNOSIS — M25512 Pain in left shoulder: Secondary | ICD-10-CM

## 2016-02-12 DIAGNOSIS — M542 Cervicalgia: Secondary | ICD-10-CM | POA: Diagnosis not present

## 2016-02-12 DIAGNOSIS — G8929 Other chronic pain: Secondary | ICD-10-CM

## 2016-02-12 NOTE — Patient Instructions (Signed)
Over Head Pull: Narrow Grip       On back, knees bent, feet flat, band across thighs, elbows straight but relaxed. Pull hands apart (start). Keeping elbows straight, bring arms up and over head, hands toward floor. Keep pull steady on band. Hold momentarily. Return slowly, keeping pull steady, back to start. Repeat _10__ times. Band color blue  Side Pull: Double Arm   On back, knees bent, feet flat. Arms perpendicular to body, shoulder level, elbows straight but relaxed. Pull arms out to sides, elbows straight. Resistance band comes across collarbones, hands toward floor. Hold momentarily. Slowly return to starting position. Repeat __10_ times. Band color _Blue____   Sash   On back, knees bent, feet flat, left hand on left hip, right hand above left. Pull right arm DIAGONALLY (hip to shoulder) across chest. Bring right arm along head toward floor. Hold momentarily. Slowly return to starting position. Repeat _10__ times. Do with left arm. Band color ___Blue___   Shoulder Rotation: Double Arm   On back, knees bent, feet flat, elbows tucked at sides, bent 90, hands palms up. Pull hands apart and down toward floor, keeping elbows near sides. Hold momentarily. Slowly return to starting position. Repeat __10_ times. Band color __Blue____

## 2016-02-12 NOTE — Therapy (Signed)
[name], [place], [address] Phone: [phone]   Fax:  [phone]  Physical Therapy Treatment  Patient Details  Name: [name] [name] MRN: [id number] Date of Birth: 15-Nov-1975 Referring Provider: Dr Jill Alexanders  Encounter Date: 02/12/2016      PT End of Session - 02/12/16 1007    Visit Number 6   Number of Visits 12   Date for PT Re-Evaluation 03/04/16   PT Start Time 0933  short session, charge will not equal time slot   PT Stop Time 1004   PT Time Calculation (min) 31 min   Activity Tolerance Patient tolerated treatment well;No increased pain   Behavior During Therapy WFL for tasks assessed/performed      Past Medical History:  Diagnosis Date  . Anxiety   . CAD (coronary artery disease), native coronary artery 05/24/2014   Anterior infarction occurring out of Hospital approximately week prior to admission 3/17 16  Cardiac catheterization 3/18 showing a proximal 40% segmental LAD, 99% mid LAD with collaterals from the right coronary artery, normal circumflex, 20-30% mid and distal right coronary artery stenosis.  3.0 x 28 mm Synergy stent by Dr. Irish Lack post dilated to 3.25 mm (incidental note that LAD was closed at the time of the PCI a few minutes after the catheterization but with no symptoms)  Echo shows distal anterior apical and distal inferior severe hypokinesis with EF around 50%   . Coronary artery disease    a. 05/2014 OOH MI/PCI: LM nl, LAD 40p, 99p (3.0x28 Synergy DES) w/ R->L Collaterals, LCX nl, RCA 30p/d, EF 55% w/ sev distal inf/ant HK.  . Depression   . Erectile dysfunction   . H/O echocardiogram    a. 05/2014 Echo: Ef 45-50%, sev mid antsept/ant HK.  Marland Kitchen Hyperlipidemia   . Hypertension    medication in 2012, stress as a big component  . MI (myocardial infarction)   . 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    . Tobacco abuse     Past Surgical History:  Procedure  Laterality Date  . CORONARY STENT PLACEMENT  05/24/2014   MID LAD DES   . LEFT HEART CATHETERIZATION WITH CORONARY ANGIOGRAM N/A 05/24/2014   Procedure: LEFT HEART CATHETERIZATION WITH CORONARY ANGIOGRAM;  Surgeon: Jettie Booze, MD;  Location: [facility] CATH LAB;  Service: Cardiovascular;  Laterality: N/A;  . PERCUTANEOUS CORONARY STENT INTERVENTION (PCI-S)  05/24/2014   Procedure: PERCUTANEOUS CORONARY STENT INTERVENTION (PCI-S);  Surgeon: Jettie Booze, MD;  Location: [facility] CATH LAB;  Service: Cardiovascular;;  . PILONIDAL CYST EXCISION  1995    There were no vitals filed for this visit.      Subjective Assessment - 02/12/16 0937    Subjective 5/10 pain .  Pain at end of work day is 5/10.  DN helps.  I have tightness.   Currently in Pain? Yes   Pain Score 5    Pain Location Scapula   Pain Orientation Left                         [facility] Adult PT Treatment/Exercise - 02/12/16 0001      Neck Exercises: Theraband   Shoulder Extension 10 reps   Shoulder Extension Limitations blue band issued, cued for scapular retraction   Rows 10 reps   Rows Limitations blue band, cued scapular retraction, abdominal bracing     Neck Exercises: Supine   Other Supine Exercise Supine scapular stabilization  series, blue band, narrow grip, horizontal pull, diagonals, er     Neck Exercises: Land --  Doorway stretch 1 rep 30 + seconds                PT Education - 02/12/16 1005    Education provided Yes   Education Details HEP   Person(s) Educated Patient   Methods Explanation;Demonstration;Tactile cues;Verbal cues;Handout   Comprehension Verbalized understanding;Returned demonstration             PT Long Term Goals - 01/27/16 0814      PT LONG TERM GOAL #1   Title independent with HEP (03/04/16)   Period Weeks   Status On-going     PT LONG TERM GOAL #2   Title improve cervical ROM by 10 degrees each motion for improved function  (03/04/16)   Period Weeks   Status On-going     PT LONG TERM GOAL #3   Title tolerate full work day with pain < 5/10 for improved function (03/04/16)   Time 6   Period Weeks   Status On-going     PT LONG TERM GOAL #4   Title verbalize understanding of posture/body mechanics to decrease risk of reinjury (03/04/16)   Time 6   Period Weeks   Status On-going               Plan - 02/12/16 1008    Clinical Impression Statement DN helps a lot.  I have a lot less pain and a lot more motion.  Progress toward HEP goals.    PT Next Visit Plan review with scap stabilizer strength .  ADL education- Body mechanics.   PT Home Exercise Plan upper trap, levator, neck retraction and heat to left neck several times a day. prone neck retraction/ supine deep neck flexor stretch, standing row and shoulder extension with red band , Supine scapular stabilization.   Consulted and Agree with Plan of Care Patient      Patient will benefit from skilled therapeutic intervention in order to improve the following deficits and impairments:  Pain, Impaired UE functional use, Decreased strength, Decreased range of motion, Impaired flexibility, Postural dysfunction, Increased muscle spasms  Visit Diagnosis: Cervicalgia  Chronic left shoulder pain  Abnormal posture     Problem List Patient Active Problem List   Diagnosis Date Noted  . Allergic reaction 12/24/2015  . Angio-edema 12/24/2015  . Food allergy 12/24/2015  . Hyperlipidemia   . Hypertension   . CAD (coronary artery disease), native coronary artery 05/24/2014  . Anxiety and depression 05/08/2014  . Family history of premature CAD 05/08/2014  . Secondary syphilis   . High risk sexual behavior   . Tobacco use disorder     Syrianna Schillaci PTA 02/12/2016, 10:14 AM  [facility] [address] [place], [place], [address] Phone: [phone]   Fax:  [phone]  Name: [name] [name] MRN:  [id number] Date of Birth: 11/22/1975

## 2016-02-17 ENCOUNTER — Ambulatory Visit: Payer: BLUE CROSS/BLUE SHIELD | Admitting: Physical Therapy

## 2016-02-19 ENCOUNTER — Ambulatory Visit: Payer: BLUE CROSS/BLUE SHIELD | Admitting: Physical Therapy

## 2016-02-19 DIAGNOSIS — M542 Cervicalgia: Secondary | ICD-10-CM | POA: Diagnosis not present

## 2016-02-19 DIAGNOSIS — M25512 Pain in left shoulder: Secondary | ICD-10-CM

## 2016-02-19 DIAGNOSIS — G8929 Other chronic pain: Secondary | ICD-10-CM

## 2016-02-19 DIAGNOSIS — R293 Abnormal posture: Secondary | ICD-10-CM

## 2016-02-19 NOTE — Therapy (Addendum)
[place], [place], [address] Phone: [phone]   Fax:  [phone]  Physical Therapy Treatment/Discharge Note  Patient Details  Name: [name] [name] MRN: [id number] Date of Birth: May 31, 1975 Referring Provider: Dr Jill Alexanders  Encounter Date: 02/19/2016      PT End of Session - 02/19/16 1559    Visit Number 7   Number of Visits 12   Date for PT Re-Evaluation 03/04/16   Authorization Type arrives 12 min late  called first to inform   PT Start Time 0359   PT Stop Time 0445   PT Time Calculation (min) 46 min   Activity Tolerance Patient tolerated treatment well;No increased pain   Behavior During Therapy WFL for tasks assessed/performed      Past Medical History:  Diagnosis Date  . Anxiety   . CAD (coronary artery disease), native coronary artery 05/24/2014   Anterior infarction occurring out of Hospital approximately week prior to admission 3/17 16  Cardiac catheterization 3/18 showing a proximal 40% segmental LAD, 99% mid LAD with collaterals from the right coronary artery, normal circumflex, 20-30% mid and distal right coronary artery stenosis.  3.0 x 28 mm Synergy stent by Dr. Irish Lack post dilated to 3.25 mm (incidental note that LAD was closed at the time of the PCI a few minutes after the catheterization but with no symptoms)  Echo shows distal anterior apical and distal inferior severe hypokinesis with EF around 50%   . Coronary artery disease    a. 05/2014 OOH MI/PCI: LM nl, LAD 40p, 99p (3.0x28 Synergy DES) w/ R->L Collaterals, LCX nl, RCA 30p/d, EF 55% w/ sev distal inf/ant HK.  . Depression   . Erectile dysfunction   . H/O echocardiogram    a. 05/2014 Echo: Ef 45-50%, sev mid antsept/ant HK.  Marland Kitchen Hyperlipidemia   . Hypertension    medication in 2012, stress as a big component  . MI (myocardial infarction)   . 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    . Tobacco abuse     Past  Surgical History:  Procedure Laterality Date  . CORONARY STENT PLACEMENT  05/24/2014   MID LAD DES   . LEFT HEART CATHETERIZATION WITH CORONARY ANGIOGRAM N/A 05/24/2014   Procedure: LEFT HEART CATHETERIZATION WITH CORONARY ANGIOGRAM;  Surgeon: Jettie Booze, MD;  Location: [facility] CATH LAB;  Service: Cardiovascular;  Laterality: N/A;  . PERCUTANEOUS CORONARY STENT INTERVENTION (PCI-S)  05/24/2014   Procedure: PERCUTANEOUS CORONARY STENT INTERVENTION (PCI-S);  Surgeon: Jettie Booze, MD;  Location: [facility] CATH LAB;  Service: Cardiovascular;;  . PILONIDAL CYST EXCISION  1995    There were no vitals filed for this visit.      Subjective Assessment - 02/19/16 1603    Subjective 5/10 intially when entering clinic.  DN helps still losing sleep with  pain   Pertinent History anxiety, depression, CAD, MI   Limitations House hold activities;Other (comment);Sitting;Lifting            [facility] PT Assessment - 02/19/16 1603      AROM   AROM Assessment Site Cervical   Cervical Flexion 45   Cervical Extension 55   Cervical - Right Side Bend 46   Cervical - Left Side Bend 39   Cervical - Right Rotation 61   Cervical - Left Rotation 54                     [facility] Adult PT Treatment/Exercise - 02/19/16  1630      Self-Care   Self-Care Posture;Other Self-Care Comments   Posture use of pillow and sleeping strategies for pain managment., use of towel under axilla to create space   Other Self-Care Comments  how to work out at gym without injury to neck /shoulders     Moist Heat Therapy   Number Minutes Moist Heat 10 Minutes   Moist Heat Location Cervical     Manual Therapy   Manual Therapy Manual Traction;Joint mobilization;Soft tissue mobilization   Manual therapy comments trigger point over L SCM, scalenes and upper trap and levator on left only   Joint Mobilization PA mobs grade 3/4 and lateral glides.   Soft tissue mobilization left scalenes, upper trap andlevator,  subscapularis and SCM   Manual Traction 3 min 20 sec hold and 10 rest for sub occipital release           Trigger Point Dry Needling - 02/19/16 1605    Consent Given? Yes   Education Handout Provided No  previously given   Muscles Treated Upper Body Infraspinatus;Levator scapulae;Upper trapezius  scalenes, SCM :Left only bil Twitches   Upper Trapezius Response Twitch reponse elicited;Palpable increased muscle length   Levator Scapulae Response Twitch response elicited;Palpable increased muscle length   Rhomboids Response Palpable increased muscle length   Infraspinatus Response Palpable increased muscle length   Subscapularis Response Palpable increased muscle length                   PT Long Term Goals - 02/19/16 1600      PT LONG TERM GOAL #1   Title independent with HEP (03/04/16)   Time 6   Period Weeks   Status Achieved     PT LONG TERM GOAL #2   Title improve cervical ROM by 10 degrees each motion for improved function (03/04/16)   Time 6   Period Weeks   Status Achieved See Flowsheet     PT LONG TERM GOAL #3   Title tolerate full work day with pain < 2/10 for improved function (03/04/16)   Time 6   Period Weeks   Status Unable to assess     PT LONG TERM GOAL #4   Title verbalize understanding of posture/body mechanics to decrease risk of reinjury (03/04/16)   Time 6   Period Weeks   Status Partially Met     PT LONG TERM GOAL #5   Title Pt will be able to sleep at night without waking due to left neck pain   Time 6   Period Weeks   Status Unable to assess               Plan - 02/19/16 1635    Clinical Impression Statement Mr. Rosenbloom states she is benefitting from TDN and was closely monitored throughout session.  Pt with several Localized twitch responses in L SCM, scalene , upper trap and levator with relief of pain..  Pt states he has been consistent with exercises and he feels it benefits him.  He is more aware of posture but he  still struggles with pain at night..  Discussed pain management with pillows and sleeping posture for neck and shoulder. Will continue to progress exerciise. Pt with increased AROM in all planes cervical after treatment to WNL   Rehab Potential Good   PT Frequency 2x / week   PT Duration 6 weeks   PT Treatment/Interventions ADLs/Self Care Home Management;Cryotherapy;Electrical Stimulation;Moist Heat;Traction;Ultrasound;Neuromuscular re-education;Therapeutic activities;Therapeutic exercise;Patient/family education;Manual techniques;Taping;Dry needling  PT Next Visit Plan review with scap stabilizer strength .  ADL education- Body mechanics.. progress exercises and progress toward goals.   PT Home Exercise Plan upper trap, levator, neck retraction and heat to left neck several times a day. prone neck retraction/ supine deep neck flexor stretch, standing row and shoulder extension with red band , Supine scapular stabilization.   Consulted and Agree with Plan of Care Patient      Patient will benefit from skilled therapeutic intervention in order to improve the following deficits and impairments:  Pain, Impaired UE functional use, Decreased strength, Decreased range of motion, Impaired flexibility, Postural dysfunction, Increased muscle spasms  Visit Diagnosis: Cervicalgia  Chronic left shoulder pain  Abnormal posture     Problem List Patient Active Problem List   Diagnosis Date Noted  . Allergic reaction 12/24/2015  . Angio-edema 12/24/2015  . Food allergy 12/24/2015  . Hyperlipidemia   . Hypertension   . CAD (coronary artery disease), native coronary artery 05/24/2014  . Anxiety and depression 05/08/2014  . Family history of premature CAD 05/08/2014  . Secondary syphilis   . High risk sexual behavior   . Tobacco use disorder     Voncille Lo, PT Exercise Expert for the Aging Adult  02/19/16 4:42 PM Phone: [phone] Fax: [place] [facility] [address] [place], [place], [address] Phone: [phone]   Fax:  [phone]  Name: Jayden Kratochvil MRN: [id number] Date of Birth: [birth year]   PHYSICAL THERAPY DISCHARGE SUMMARY  Visits from Start of Care: 7  Current functional level related to goals / functional outcomes: As above   Remaining deficits: Pt improving in pain level but did enter clinic with 5/10 pain   Education / Equipment: HEP Plan:                                                    Patient goals were partially met. Patient is being discharged due to not returning since the last visit.  ?????     Voncille Lo, PT Exercise Expert for the Aging Adult  04/08/16 7:44 AM Phone: [phone] Fax: [phone]

## 2016-02-24 ENCOUNTER — Ambulatory Visit: Payer: BLUE CROSS/BLUE SHIELD | Admitting: Physical Therapy

## 2016-05-08 IMAGING — CR DG CHEST 2V
2 series · 2 of 2 positions shown · non-contrast
Comparison: None.

CLINICAL DATA: Left chest discomfort.

EXAM:
CHEST  2 VIEW

[w chest pa]
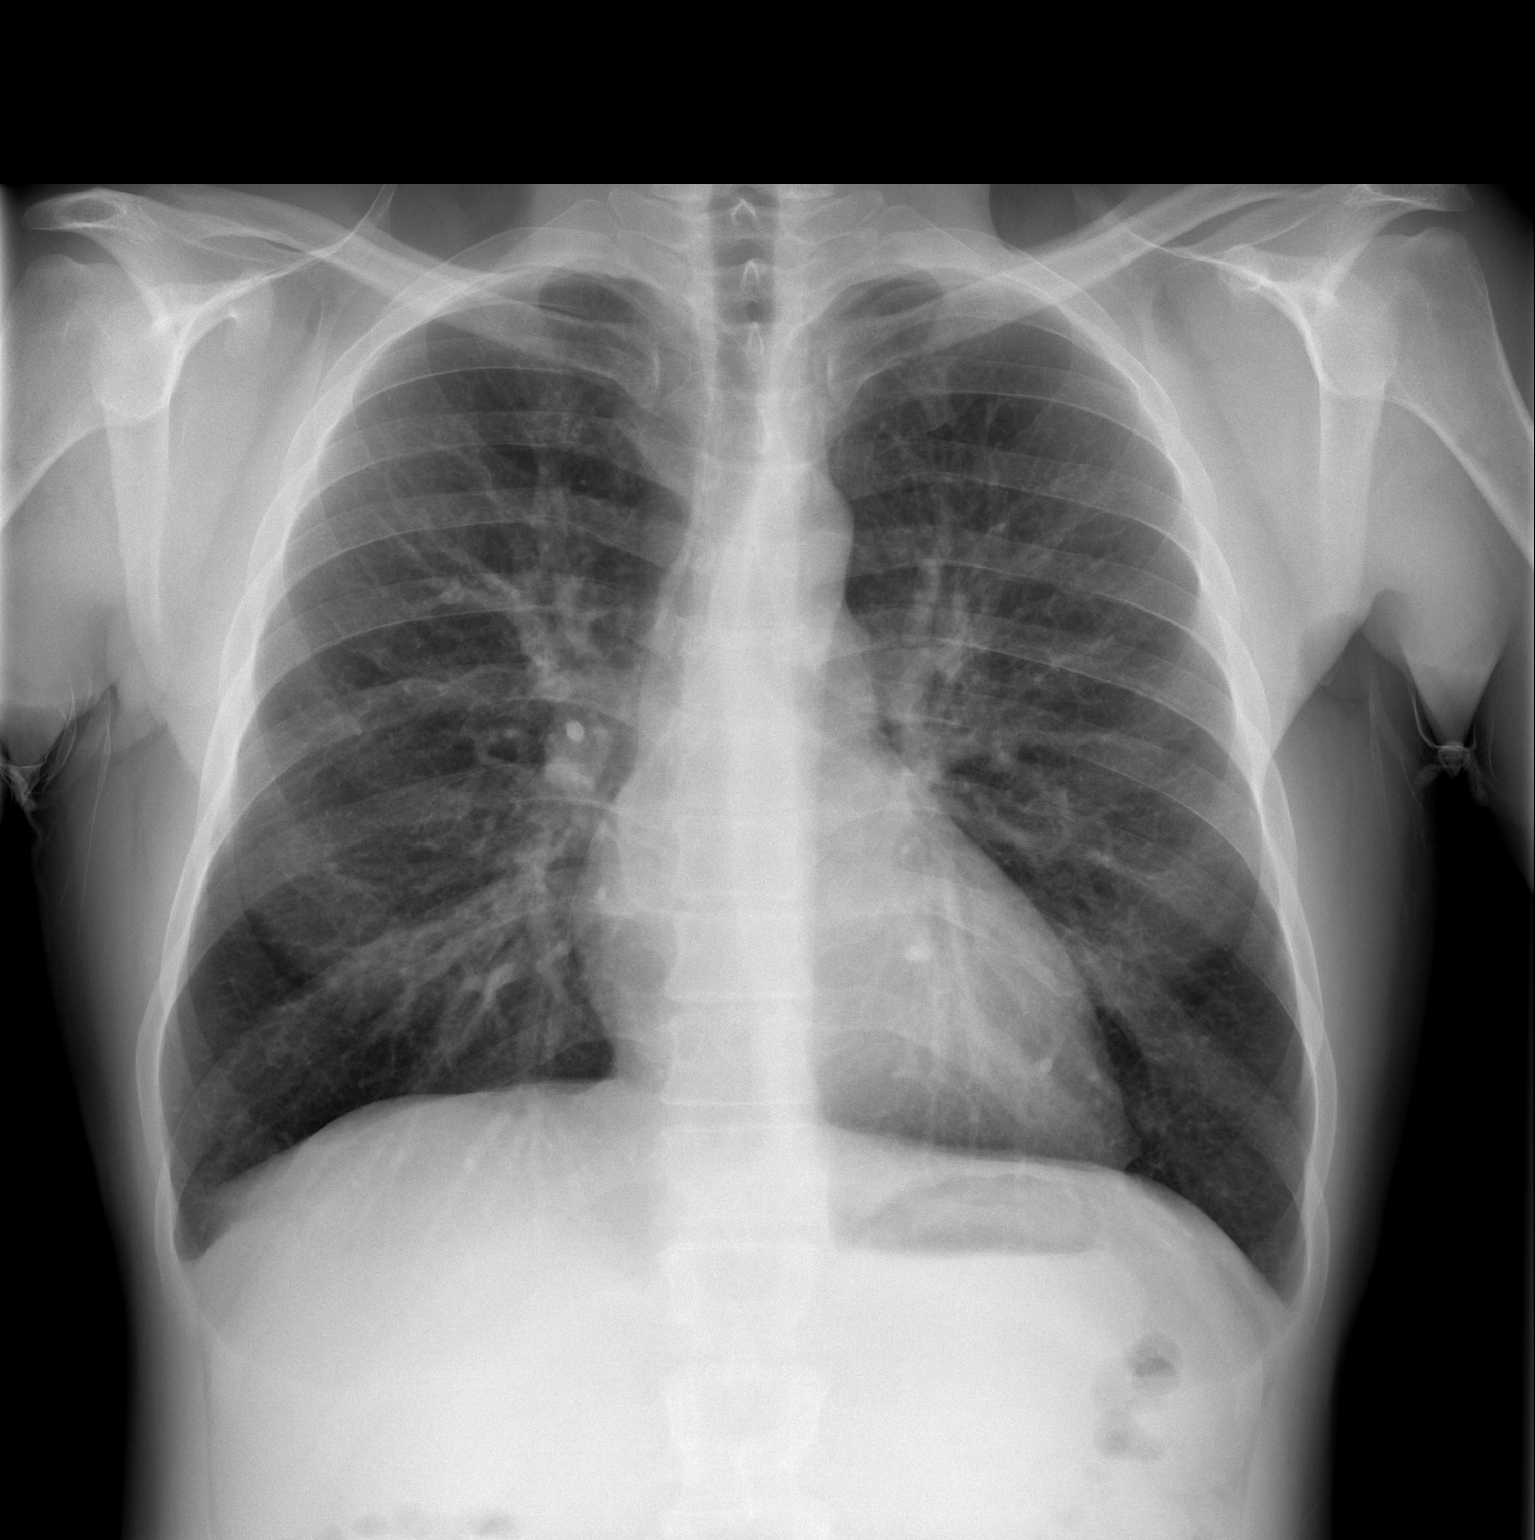

[w chest lat]
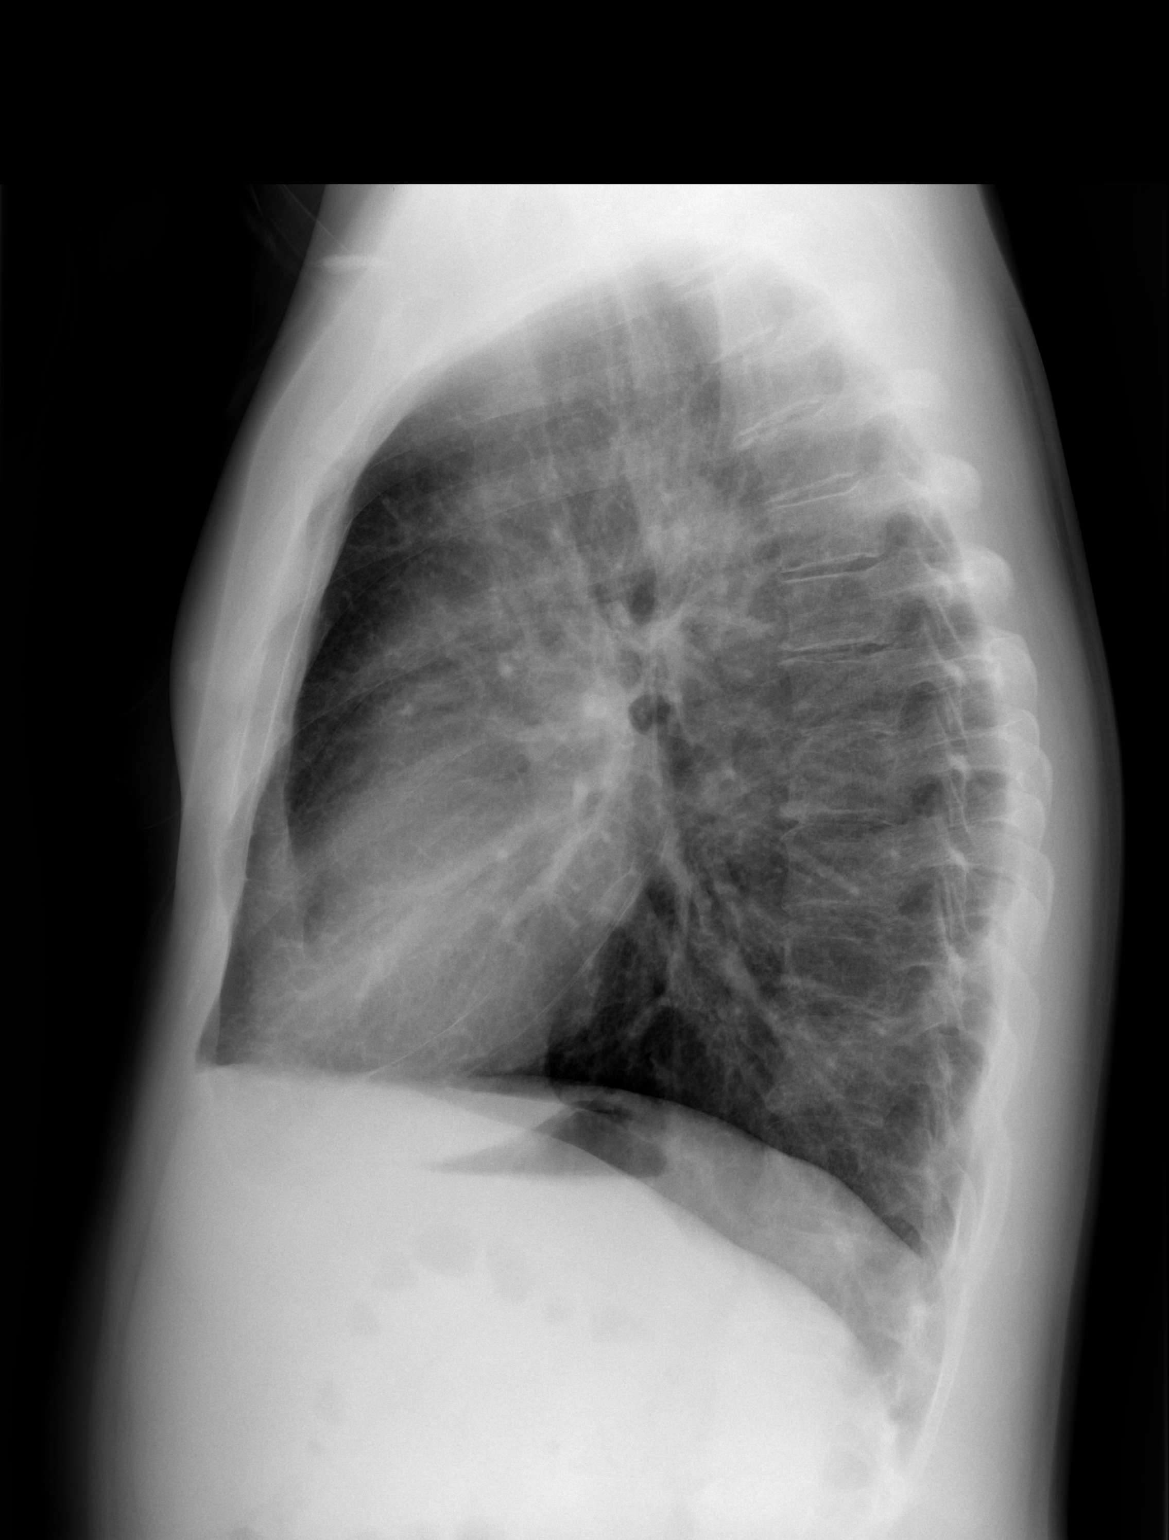

[2 of 2 positions shown; findings below may reference images not displayed]

FINDINGS: Upper mediastinum and hilar structures are normal. Heart size
normal. No focal pulmonary infiltrate. No pleural effusion or
pneumothorax. Noted on the lateral view is mild soft tissue
prominence in the lower retrosternal space. To exclude retrosternal
mass contrast-enhanced chest CT is suggested. No acute bony
abnormality identified.
IMPRESSION: 1. Mild soft tissue prominence in the lower retrosternal space seen
on lateral view. To exclude retrosternal mass contrast-enhanced
chest CT is suggested.

2. No acute cardiopulmonary disease.

## 2016-08-05 ENCOUNTER — Ambulatory Visit (INDEPENDENT_AMBULATORY_CARE_PROVIDER_SITE_OTHER): Payer: Managed Care, Other (non HMO) | Admitting: Family Medicine

## 2016-08-05 ENCOUNTER — Encounter: Payer: Self-pay | Admitting: Family Medicine

## 2016-08-05 VITALS — BP 160/90 | HR 81 | Wt 158.0 lb

## 2016-08-05 DIAGNOSIS — F329 Major depressive disorder, single episode, unspecified: Secondary | ICD-10-CM | POA: Diagnosis not present

## 2016-08-05 DIAGNOSIS — F419 Anxiety disorder, unspecified: Secondary | ICD-10-CM

## 2016-08-05 DIAGNOSIS — F418 Other specified anxiety disorders: Secondary | ICD-10-CM | POA: Diagnosis not present

## 2016-08-05 DIAGNOSIS — Z7251 High risk heterosexual behavior: Secondary | ICD-10-CM | POA: Diagnosis not present

## 2016-08-05 DIAGNOSIS — I10 Essential (primary) hypertension: Secondary | ICD-10-CM

## 2016-08-05 DIAGNOSIS — I2510 Atherosclerotic heart disease of native coronary artery without angina pectoris: Secondary | ICD-10-CM | POA: Diagnosis not present

## 2016-08-05 DIAGNOSIS — E785 Hyperlipidemia, unspecified: Secondary | ICD-10-CM

## 2016-08-05 MED ORDER — LISINOPRIL 5 MG PO TABS: 5.0000 mg | ORAL_TABLET | Freq: Every day | ORAL | 3 refills | Status: DC

## 2016-08-05 MED ORDER — EMTRICITABINE-TENOFOVIR DF 200-300 MG PO TABS: 1.0000 | ORAL_TABLET | Freq: Every day | ORAL | 0 refills | Status: DC

## 2016-08-05 MED ORDER — ALPRAZOLAM 0.5 MG PO TABS: 0.5000 mg | ORAL_TABLET | Freq: Two times a day (BID) | ORAL | 0 refills | Status: DC | PRN

## 2016-08-05 MED ORDER — METOPROLOL TARTRATE 25 MG PO TABS: 25.0000 mg | ORAL_TABLET | Freq: Two times a day (BID) | ORAL | 3 refills | Status: DC

## 2016-08-05 MED ORDER — ATORVASTATIN CALCIUM 40 MG PO TABS: 40.0000 mg | ORAL_TABLET | Freq: Every day | ORAL | 3 refills | Status: DC

## 2016-08-05 NOTE — Progress Notes (Signed)
   Subjective:    Patient ID: [name] [name], male    DOB: 1975-07-06, 41 y.o.   MRN: [id number]  HPI He is here for a recheck and to start on prep again. He now has insurance. He was seen at [facility]. He did have an oral HIV as well as syphilis test and both were negative. It also like to be started back on his blood pressure and cholesterol medications. He also rarely uses Xanax and will like a refill on that.   Review of Systems     Objective:   Physical Exam Alert and in no distress otherwise not examined       Assessment & Plan:  Coronary artery disease involving native coronary artery of native heart without angina pectoris - Plan: metoprolol tartrate (LOPRESSOR) 25 MG tablet, lisinopril (PRINIVIL,ZESTRIL) 5 MG tablet  Situational anxiety - Plan: ALPRAZolam (XANAX) 0.5 MG tablet  High risk sexual behavior - Plan: emtricitabine-tenofovir (TRUVADA) 200-300 MG tablet  Anxiety and depression  Hyperlipidemia, unspecified hyperlipidemia type - Plan: atorvastatin (LIPITOR) 40 MG tablet  Essential hypertension - Plan: metoprolol tartrate (LOPRESSOR) 25 MG tablet, lisinopril (PRINIVIL,ZESTRIL) 5 MG tablet Starting back on all of his medications including Truvada. Explained that the testing he had done only really is accurate greater than 3 months ago. He is aware of this. I will go ahead and start him on the medication and recheck here in 3 months with a complete examination.

## 2016-08-06 ENCOUNTER — Other Ambulatory Visit: Payer: Self-pay | Admitting: Family Medicine

## 2016-08-06 DIAGNOSIS — F418 Other specified anxiety disorders: Secondary | ICD-10-CM

## 2016-08-06 NOTE — Telephone Encounter (Signed)
Is this okay to refill? 

## 2016-08-06 NOTE — Telephone Encounter (Signed)
Make sure this was called in.

## 2016-08-06 NOTE — Telephone Encounter (Signed)
Called in today

## 2016-08-11 ENCOUNTER — Encounter: Payer: Self-pay | Admitting: Family Medicine

## 2016-08-11 ENCOUNTER — Telehealth: Payer: Self-pay | Admitting: Family Medicine

## 2016-08-11 NOTE — Telephone Encounter (Signed)
P.A. Noah Charon

## 2016-08-15 NOTE — Telephone Encounter (Signed)
Recv'd response back no P.A. Required

## 2016-08-26 ENCOUNTER — Encounter (HOSPITAL_COMMUNITY): Payer: Self-pay | Admitting: Emergency Medicine

## 2016-08-26 ENCOUNTER — Emergency Department (HOSPITAL_COMMUNITY): Payer: Managed Care, Other (non HMO)

## 2016-08-26 ENCOUNTER — Encounter (HOSPITAL_COMMUNITY): Payer: Self-pay

## 2016-08-26 ENCOUNTER — Ambulatory Visit (HOSPITAL_COMMUNITY)
Admission: EM | Admit: 2016-08-26 | Discharge: 2016-08-26 | Disposition: A | Discharge status: Other Acute Inpatient Hospital | Payer: Managed Care, Other (non HMO)

## 2016-08-26 ENCOUNTER — Other Ambulatory Visit: Payer: Self-pay

## 2016-08-26 ENCOUNTER — Emergency Department (HOSPITAL_COMMUNITY)
Admission: EM | Admit: 2016-08-26 | Discharge: 2016-08-26 | Disposition: A | Discharge status: Home / Self Care | Payer: Managed Care, Other (non HMO) | Attending: Emergency Medicine | Admitting: Emergency Medicine

## 2016-08-26 DIAGNOSIS — F17210 Nicotine dependence, cigarettes, uncomplicated: Secondary | ICD-10-CM | POA: Diagnosis not present

## 2016-08-26 DIAGNOSIS — Z79899 Other long term (current) drug therapy: Secondary | ICD-10-CM | POA: Diagnosis not present

## 2016-08-26 DIAGNOSIS — I2510 Atherosclerotic heart disease of native coronary artery without angina pectoris: Secondary | ICD-10-CM | POA: Diagnosis not present

## 2016-08-26 DIAGNOSIS — J209 Acute bronchitis, unspecified: Secondary | ICD-10-CM | POA: Diagnosis not present

## 2016-08-26 DIAGNOSIS — R0789 Other chest pain: Secondary | ICD-10-CM | POA: Diagnosis not present

## 2016-08-26 DIAGNOSIS — I10 Essential (primary) hypertension: Secondary | ICD-10-CM | POA: Diagnosis not present

## 2016-08-26 DIAGNOSIS — F419 Anxiety disorder, unspecified: Secondary | ICD-10-CM

## 2016-08-26 DIAGNOSIS — R0602 Shortness of breath: Secondary | ICD-10-CM | POA: Diagnosis present

## 2016-08-26 LAB — BASIC METABOLIC PANEL
Anion gap: 11 (ref 5–15)
CALCIUM: 9.3 mg/dL (ref 8.9–10.3)
CO2: 23 mmol/L (ref 22–32)
CREATININE: 0.66 mg/dL (ref 0.61–1.24)
Chloride: 103 mmol/L (ref 101–111)
GFR calc Af Amer: >60 mL/min (ref >60)
GFR calc non Af Amer: >60 mL/min (ref >60)
GLUCOSE: 138 mg/dL — AB (ref 65–99)
Potassium: 4 mmol/L (ref 3.5–5.1)
Sodium: 137 mmol/L (ref 135–145)

## 2016-08-26 LAB — CBC
HCT: 46.6 % (ref 39.0–52.0)
Hemoglobin: 15.9 g/dL (ref 13.0–17.0)
MCH: 32.9 pg (ref 26.0–34.0)
MCHC: 34.1 g/dL (ref 30.0–36.0)
MCV: 96.5 fL (ref 78.0–100.0)
PLATELETS: 151 10*3/uL (ref 150–400)
RBC: 4.83 MIL/uL (ref 4.22–5.81)
RDW: 13.3 % (ref 11.5–15.5)
WBC: 7.3 10*3/uL (ref 4.0–10.5)

## 2016-08-26 LAB — I-STAT TROPONIN, ED: TROPONIN I, POC: 0.01 ng/mL (ref 0.00–0.08)

## 2016-08-26 MED ORDER — DOXYCYCLINE HYCLATE 100 MG PO CAPS: 100.0000 mg | ORAL_CAPSULE | Freq: Two times a day (BID) | ORAL | 0 refills | Status: DC

## 2016-08-26 NOTE — Discharge Instructions (Signed)
Go straight to ED.

## 2016-08-26 NOTE — ED Notes (Signed)
Pt. Returned from [employer]

## 2016-08-26 NOTE — ED Provider Notes (Signed)
[facility] DEPT Provider Note   CSN: [id number] Arrival date & time: 08/26/16  1102     History   Chief Complaint Chief Complaint  Patient presents with  . Shortness of Breath    HPI [name] [name] is a 41 y.o. male.  Patient c/o feeling sob when out in heat for past 4-5 days. States on brief occasions has experienced spasm in each hand and wrist - ?carpal spasm due to hyperventilation.  No current hand numbness/spasm. No hx asthma. Occasional non prod cough, no sore throat, or other uri c/o. Symptoms occur at rest, no relation to activity or exertion. No chest pain or discomfort. No fever or chills. No leg pain or swelling. States compliant w meds.    The history is provided by the patient.  Shortness of Breath  Pertinent negatives include no fever, no headaches, no sore throat, no neck pain, no cough, no chest pain, no vomiting, no abdominal pain, no rash and no leg swelling.  Chest Pain   Associated symptoms include shortness of breath. Pertinent negatives include no abdominal pain, no back pain, no cough, no fever, no headaches and no vomiting.    Past Medical History:  Diagnosis Date  . Anxiety   . CAD (coronary artery disease), native coronary artery 05/24/2014   Anterior infarction occurring out of Hospital approximately week prior to admission 3/17 16  Cardiac catheterization 3/18 showing a proximal 40% segmental LAD, 99% mid LAD with collaterals from the right coronary artery, normal circumflex, 20-30% mid and distal right coronary artery stenosis.  3.0 x 28 mm Synergy stent by Dr. Irish Lack post dilated to 3.25 mm (incidental note that LAD was closed at the time of the PCI a few minutes after the catheterization but with no symptoms)  Echo shows distal anterior apical and distal inferior severe hypokinesis with EF around 50%   . Coronary artery disease    a. 05/2014 OOH MI/PCI: LM nl, LAD 40p, 99p (3.0x28 Synergy DES) w/ R->L Collaterals, LCX nl, RCA 30p/d, EF 55% w/ sev  distal inf/ant HK.  . Depression   . Erectile dysfunction   . H/O echocardiogram    a. 05/2014 Echo: Ef 45-50%, sev mid antsept/ant HK.  Marland Kitchen Hyperlipidemia   . Hypertension    medication in 2012, stress as a big component  . MI (myocardial infarction) ([facility])   . 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    . Tobacco abuse     Patient Active Problem List   Diagnosis Date Noted  . Allergic reaction 12/24/2015  . Angio-edema 12/24/2015  . Food allergy 12/24/2015  . Hyperlipidemia   . Hypertension   . CAD (coronary artery disease), native coronary artery 05/24/2014  . Anxiety and depression 05/08/2014  . Family history of premature CAD 05/08/2014  . Secondary syphilis   . High risk sexual behavior   . Tobacco use disorder     Past Surgical History:  Procedure Laterality Date  . CORONARY STENT PLACEMENT  05/24/2014   MID LAD DES   . LEFT HEART CATHETERIZATION WITH CORONARY ANGIOGRAM N/A 05/24/2014   Procedure: LEFT HEART CATHETERIZATION WITH CORONARY ANGIOGRAM;  Surgeon: Jettie Booze, MD;  Location: [facility] CATH LAB;  Service: Cardiovascular;  Laterality: N/A;  . PERCUTANEOUS CORONARY STENT INTERVENTION (PCI-S)  05/24/2014   Procedure: PERCUTANEOUS CORONARY STENT INTERVENTION (PCI-S);  Surgeon: Jettie Booze, MD;  Location: [facility] CATH LAB;  Service: Cardiovascular;;  . [place]  Medications    Prior to Admission medications   Medication Sig Start Date End Date Taking? Authorizing Provider  ALPRAZolam Duanne Moron) 0.5 MG tablet TAKE ONE TABLET BY MOUTH TWICE A DAY AS NEEDED FOR ANXIETY 08/06/16   Denita Lung, MD  aspirin EC 81 MG EC tablet Take 1 tablet (81 mg total) by mouth daily. 05/25/14   Rogelia Mire, NP  atorvastatin (LIPITOR) 40 MG tablet Take 1 tablet (40 mg total) by mouth daily at 6 PM. 08/05/16   Denita Lung, MD  doxycycline (VIBRA-TABS) 100 MG tablet Take 1 tablet (100 mg total) by mouth 2 (two) times  daily. Patient not taking: Reported on 01/22/2016 10/20/15   Denita Lung, MD  emtricitabine-tenofovir (TRUVADA) 200-300 MG tablet Take 1 tablet by mouth daily. 08/05/16   Denita Lung, MD  lisinopril (PRINIVIL,ZESTRIL) 5 MG tablet Take 1 tablet (5 mg total) by mouth daily. 08/05/16   Denita Lung, MD  metoprolol tartrate (LOPRESSOR) 25 MG tablet Take 1 tablet (25 mg total) by mouth 2 (two) times daily. 08/05/16   Denita Lung, MD  Multiple Vitamin (MULTIVITAMIN WITH MINERALS) TABS tablet Take 1 tablet by mouth daily.    [provider]  nitroGLYCERIN (NITROSTAT) 0.4 MG SL tablet Place 1 tablet (0.4 mg total) under the tongue every 5 (five) minutes x 3 doses as needed for chest pain. Patient not taking: Reported on 01/22/2016 05/25/14   Rogelia Mire, NP  predniSONE (DELTASONE) 10 MG tablet 6/5/4/3/2/1 taper Patient not taking: Reported on 01/22/2016 12/24/15   Tysinger, Camelia Eng, PA-C    Family History Family History  Problem Relation Age of Onset  . Thyroid disease Mother   . Heart disease Father 28       MI, stents  . Peripheral Artery Disease Father   . Diabetes type II Father   . Diabetes Father   . Diabetes Maternal Grandmother   . Hypertension Maternal Grandfather   . Hypertension Paternal Grandfather   . Cancer Neg Hx   . Stroke Neg Hx     Social History Social History  Substance Use Topics  . Smoking status: Current Every Day Smoker    Packs/day: 1.00    Years: 23.00    Types: Cigarettes  . Smokeless tobacco: Never Used  . Alcohol use 14.4 oz/week    24 Cans of beer per week     Comment: "3-5 times/wk I'll have 3-5 beers; max of 24 beers/wk"     Allergies   Gadolinium derivatives   Review of Systems Review of Systems  Constitutional: Negative for chills and fever.  HENT: Negative for sore throat.   Eyes: Negative for redness.  Respiratory: Positive for shortness of breath. Negative for cough.   Cardiovascular: Negative for chest pain  and leg swelling.  Gastrointestinal: Negative for abdominal pain and vomiting.  Genitourinary: Negative for flank pain.  Musculoskeletal: Negative for back pain and neck pain.  Skin: Negative for rash.  Neurological: Negative for headaches.  Hematological: Does not bruise/bleed easily.  Psychiatric/Behavioral: Negative for confusion.     Physical Exam Updated Vital Signs BP (!) 142/93 (BP Location: Left Arm)   Pulse 91   Temp 98.1 F (36.7 C) (Oral)   Resp 18   Ht 1.626 m (5' 4" )   Wt 71.7 kg (158 lb)   SpO2 97%   BMI 27.12 kg/m   Physical Exam  Constitutional: He appears well-developed and well-nourished. No distress.  HENT:  Mouth/Throat: Oropharynx is  clear and moist.  Eyes: Conjunctivae are normal.  Neck: Neck supple. No tracheal deviation present. No thyromegaly present.  Cardiovascular: Normal rate, regular rhythm, normal heart sounds and intact distal pulses.  Exam reveals no gallop and no friction rub.   No murmur heard. Pulmonary/Chest: Effort normal and breath sounds normal. No accessory muscle usage. No respiratory distress.  Abdominal: Soft. Bowel sounds are normal. He exhibits no distension. There is no tenderness.  Musculoskeletal: He exhibits no edema or tenderness.  bil rad pulse 2+  Neurological: He is alert.  Steady gait.   Skin: Skin is warm and dry. He is not diaphoretic.  Psychiatric: He has a normal mood and affect.  Nursing note and vitals reviewed.    ED Treatments / Results  Labs (all labs ordered are listed, but only abnormal results are displayed) Results for orders placed or performed during the hospital encounter of 94/70/96  Basic metabolic panel  Result Value Ref Range   Sodium 137 135 - 145 mmol/L   Potassium 4.0 3.5 - 5.1 mmol/L   Chloride 103 101 - 111 mmol/L   CO2 23 22 - 32 mmol/L   Glucose, Bld 138 (H) 65 - 99 mg/dL   BUN <5 (L) 6 - 20 mg/dL   Creatinine, Ser 0.66 0.61 - 1.24 mg/dL   Calcium 9.3 8.9 - 10.3 mg/dL   GFR calc  non Af Amer >60 >60 mL/min   GFR calc Af Amer >60 >60 mL/min   Anion gap 11 5 - 15  CBC  Result Value Ref Range   WBC 7.3 4.0 - 10.5 K/uL   RBC 4.83 4.22 - 5.81 MIL/uL   Hemoglobin 15.9 13.0 - 17.0 g/dL   HCT 46.6 39.0 - 52.0 %   MCV 96.5 78.0 - 100.0 fL   MCH 32.9 26.0 - 34.0 pg   MCHC 34.1 30.0 - 36.0 g/dL   RDW 13.3 11.5 - 15.5 %   Platelets 151 150 - 400 K/uL  I-stat troponin, ED  Result Value Ref Range   Troponin i, poc 0.01 0.00 - 0.08 ng/mL   Comment 3            EKG  EKG Interpretation  Date/Time:  Thursday August 26 2016 11:06:41 EDT Ventricular Rate:  89 PR Interval:  128 QRS Duration: 78 QT Interval:  358 QTC Calculation: 435 R Axis:   -5 Text Interpretation:  Normal sinus rhythm Cannot rule out Anterior infarct , age undetermined Abnormal ECG slight increase anterolateral ST change compared to old.no STEMI Confirmed by Charlesetta Shanks [phone]) on 08/26/2016 11:13:40 AM       Radiology Dg Chest 2 View  Result Date: 08/26/2016 CLINICAL DATA:  Chest tightness and shortness of breath for the past 4-5 days, previous MI with stent placement, current smoker. EXAM: CHEST  2 VIEW COMPARISON:  None which will display in PACs. FINDINGS: The lungs are well-expanded. There is mild hemidiaphragm flattening. There is no alveolar infiltrate. The heart and pulmonary vascularity are normal. The perihilar lung markings are coarse however. The mediastinum is normal in width. IMPRESSION: Increased perihilar interstitial density may reflect subsegmental atelectasis as can be seen with acute bronchitis. There is no discrete pneumonia. Follow-up radiographs following anticipated antibiotic therapy are recommended to assure clearing. Electronically Signed   By: Montgomery  Martinique M.D.   On: 08/26/2016 12:22    Procedures Procedures (including critical care time)  Medications Ordered in ED Medications - No data to display   Initial Impression / Assessment and  Plan / ED Course  I have  reviewed the triage vital signs and the nursing notes.  Pertinent labs & imaging results that were available during my care of the patient were reviewed by me and considered in my medical decision making (see chart for details).  Labs sent.  Cxr.  Reviewed nursing notes and prior charts for additional history.   After symptoms for several days, and no chest pain, trop is negative - symptoms do not appear c/w acs.  Given occ cough, infiltrate/pna vs bronchitis on cxr, will rx.   Recheck pt, no increased work of breathing. Vitals normal.  Vitals:   08/26/16 1200 08/26/16 1300  BP: 133/77 127/78  Pulse: 82 77  Resp: 16 12  Temp:  98.3 F (36.8 C)   Pt currently appears stable for d/c.     Final Clinical Impressions(s) / ED Diagnoses   Final diagnoses:  None    New Prescriptions New Prescriptions   No medications on file     Lajean Saver, MD 08/26/16 1313

## 2016-08-26 NOTE — ED Notes (Signed)
Pt. Transported to xray

## 2016-08-26 NOTE — ED Provider Notes (Signed)
CSN: [id number]     Arrival date & time 08/26/16  1024 History   None    Chief Complaint  Patient presents with  . Chest Pain   (Consider location/radiation/quality/duration/timing/severity/associated sxs/prior Treatment) Patient c/o chest pain and anxiety.  He has hx of CAD and coronary artery stents.   The history is provided by the patient.  Chest Pain  Pain location:  Substernal area Pain quality: dull   Pain radiates to:  L arm Pain severity:  Mild Onset quality:  Sudden Duration:  1 day Timing:  Constant Progression:  Worsening Chronicity:  New Relieved by:  Nothing   Past Medical History:  Diagnosis Date  . Anxiety   . CAD (coronary artery disease), native coronary artery 05/24/2014   Anterior infarction occurring out of Hospital approximately week prior to admission 3/17 16  Cardiac catheterization 3/18 showing a proximal 40% segmental LAD, 99% mid LAD with collaterals from the right coronary artery, normal circumflex, 20-30% mid and distal right coronary artery stenosis.  3.0 x 28 mm Synergy stent by Dr. Irish Lack post dilated to 3.25 mm (incidental note that LAD was closed at the time of the PCI a few minutes after the catheterization but with no symptoms)  Echo shows distal anterior apical and distal inferior severe hypokinesis with EF around 50%   . Coronary artery disease    a. 05/2014 OOH MI/PCI: LM nl, LAD 40p, 99p (3.0x28 Synergy DES) w/ R->L Collaterals, LCX nl, RCA 30p/d, EF 55% w/ sev distal inf/ant HK.  . Depression   . Erectile dysfunction   . H/O echocardiogram    a. 05/2014 Echo: Ef 45-50%, sev mid antsept/ant HK.  Marland Kitchen Hyperlipidemia   . Hypertension    medication in 2012, stress as a big component  . MI (myocardial infarction) ([place])   . 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    . Tobacco abuse    Past Surgical History:  Procedure Laterality Date  . CORONARY STENT PLACEMENT  05/24/2014   MID LAD DES   . LEFT HEART CATHETERIZATION WITH  CORONARY ANGIOGRAM N/A 05/24/2014   Procedure: LEFT HEART CATHETERIZATION WITH CORONARY ANGIOGRAM;  Surgeon: Jettie Booze, MD;  Location: [facility] CATH LAB;  Service: Cardiovascular;  Laterality: N/A;  . PERCUTANEOUS CORONARY STENT INTERVENTION (PCI-S)  05/24/2014   Procedure: PERCUTANEOUS CORONARY STENT INTERVENTION (PCI-S);  Surgeon: Jettie Booze, MD;  Location[facility] CATH LAB;  Service: Cardiovascular;;  . PILONIDAL CYST EXCISION  1995   Family History  Problem Relation Age of Onset  . Thyroid disease Mother   . Heart disease Father 32       MI, stents  . Peripheral Artery Disease Father   . Diabetes type II Father   . Diabetes Father   . Diabetes Maternal Grandmother   . Hypertension Maternal Grandfather   . Hypertension Paternal Grandfather   . Cancer Neg Hx   . Stroke Neg Hx    Social History  Substance Use Topics  . Smoking status: Current Every Day Smoker    Packs/day: 1.00    Years: 23.00    Types: Cigarettes  . Smokeless tobacco: Never Used  . Alcohol use 14.4 oz/week    24 Cans of beer per week     Comment: "3-5 times/wk I'll have 3-5 beers; max of 24 beers/wk"    Review of Systems  Constitutional: Negative.   HENT: Negative.   Eyes: Negative.   Respiratory: Negative.   Cardiovascular: Positive for chest pain.  Gastrointestinal:  Negative.   Endocrine: Negative.   Genitourinary: Negative.   Musculoskeletal: Negative.   Allergic/Immunologic: Negative.   Neurological: Negative.   Hematological: Negative.   Psychiatric/Behavioral: Negative.     Allergies  Gadolinium derivatives  Home Medications   Prior to Admission medications   Medication Sig Start Date End Date Taking? Authorizing Provider  ALPRAZolam Duanne Moron) 0.5 MG tablet TAKE ONE TABLET BY MOUTH TWICE A DAY AS NEEDED FOR ANXIETY 08/06/16   Denita Lung, MD  aspirin EC 81 MG EC tablet Take 1 tablet (81 mg total) by mouth daily. 05/25/14   Rogelia Mire, NP  atorvastatin (LIPITOR) 40 MG  tablet Take 1 tablet (40 mg total) by mouth daily at 6 PM. 08/05/16   Denita Lung, MD  doxycycline (VIBRA-TABS) 100 MG tablet Take 1 tablet (100 mg total) by mouth 2 (two) times daily. Patient not taking: Reported on 01/22/2016 10/20/15   Denita Lung, MD  emtricitabine-tenofovir (TRUVADA) 200-300 MG tablet Take 1 tablet by mouth daily. 08/05/16   Denita Lung, MD  lisinopril (PRINIVIL,ZESTRIL) 5 MG tablet Take 1 tablet (5 mg total) by mouth daily. 08/05/16   Denita Lung, MD  metoprolol tartrate (LOPRESSOR) 25 MG tablet Take 1 tablet (25 mg total) by mouth 2 (two) times daily. 08/05/16   Denita Lung, MD  Multiple Vitamin (MULTIVITAMIN WITH MINERALS) TABS tablet Take 1 tablet by mouth daily.    [provider]  nitroGLYCERIN (NITROSTAT) 0.4 MG SL tablet Place 1 tablet (0.4 mg total) under the tongue every 5 (five) minutes x 3 doses as needed for chest pain. Patient not taking: Reported on 01/22/2016 05/25/14   Rogelia Mire, NP  predniSONE (DELTASONE) 10 MG tablet 6/5/4/3/2/1 taper Patient not taking: Reported on 01/22/2016 12/24/15   Tysinger, Camelia Eng, PA-C   Meds Ordered and Administered this Visit  Medications - No data to display  BP (!) 158/103 (BP Location: Left Arm)   Pulse 94   Temp 99.3 F (37.4 C) (Oral)   SpO2 99%  No data found.   Physical Exam  Constitutional: He appears well-developed and well-nourished.  HENT:  Head: Normocephalic and atraumatic.  Right Ear: External ear normal.  Left Ear: External ear normal.  Mouth/Throat: Oropharynx is clear and moist.  Eyes: Conjunctivae and EOM are normal. Pupils are equal, round, and reactive to light.  Neck: Normal range of motion. Neck supple.  Cardiovascular: Normal rate, regular rhythm and normal heart sounds.   Pulmonary/Chest: Effort normal and breath sounds normal.  Nursing note and vitals reviewed.   Urgent Care Course     Procedures (including critical care time)  Labs Review Labs  Reviewed - No data to display  Imaging Review No results found.   Visual Acuity Review  Right Eye Distance:   Left Eye Distance:   Bilateral Distance:    Right Eye Near:   Left Eye Near:    Bilateral Near:         MDM   1. Other chest pain   2. Anxiety    Explained to patient he needs to go straight to ED and he declines EMS transfer and will be taken in [place] to ED.    Lysbeth Penner, FNP 08/26/16 1104

## 2016-08-26 NOTE — ED Triage Notes (Addendum)
Pt reports left chest pressure intermittently over the last few days.  Pt also reports SOB, left sided headache and numbness and tingling in the left arm associated with these episodes.  Pt has a history of CAD w/ stent placement 1-2 years ago.  Stevan Born, FNP at the bedside.

## 2016-08-26 NOTE — ED Notes (Signed)
Pt taken to Xray.

## 2016-08-26 NOTE — Discharge Instructions (Signed)
It was our pleasure to provide your ER care today - we hope that you feel better.  Rest. Drink plenty of fluids.  Take antibiotic as prescribed.   Avoid any smoking.   Follow up with primary care doctor in 1 week if symptoms fail to improve/resolve.  Return to ER if worse, new symptoms, increased difficulty breathing, chest pain, other concern.

## 2016-08-26 NOTE — ED Triage Notes (Signed)
Chest tightness and sob x 4-5 days that is worse when he is in the heat. Pt states he feels like he is having a hard time breathing and feels as though he is "missing a breath". PT endorses productive cough with white/ yellow sputum. Denies fever, chills, dizziness. Pt reports having mi in the past.

## 2016-08-26 NOTE — ED Notes (Signed)
MD Steinl at the bedside

## 2016-09-17 NOTE — Telephone Encounter (Signed)
Pt called back no issues getting meds

## 2016-09-17 NOTE — Telephone Encounter (Signed)
Left message for pt

## 2016-11-01 ENCOUNTER — Other Ambulatory Visit: Payer: Self-pay | Admitting: Family Medicine

## 2016-11-01 ENCOUNTER — Encounter: Payer: Self-pay | Admitting: Family Medicine

## 2016-11-01 ENCOUNTER — Ambulatory Visit (INDEPENDENT_AMBULATORY_CARE_PROVIDER_SITE_OTHER): Payer: Managed Care, Other (non HMO) | Admitting: Family Medicine

## 2016-11-01 VITALS — BP 112/70 | HR 74 | Ht 64.0 in | Wt 153.6 lb

## 2016-11-01 DIAGNOSIS — Z7251 High risk heterosexual behavior: Secondary | ICD-10-CM | POA: Diagnosis not present

## 2016-11-01 DIAGNOSIS — F17200 Nicotine dependence, unspecified, uncomplicated: Secondary | ICD-10-CM | POA: Diagnosis not present

## 2016-11-01 DIAGNOSIS — Z8619 Personal history of other infectious and parasitic diseases: Secondary | ICD-10-CM | POA: Diagnosis not present

## 2016-11-01 DIAGNOSIS — I2510 Atherosclerotic heart disease of native coronary artery without angina pectoris: Secondary | ICD-10-CM | POA: Diagnosis not present

## 2016-11-01 DIAGNOSIS — Z0000 Encounter for general adult medical examination without abnormal findings: Secondary | ICD-10-CM

## 2016-11-01 DIAGNOSIS — Z8042 Family history of malignant neoplasm of prostate: Secondary | ICD-10-CM

## 2016-11-01 DIAGNOSIS — Z23 Encounter for immunization: Secondary | ICD-10-CM

## 2016-11-01 DIAGNOSIS — E785 Hyperlipidemia, unspecified: Secondary | ICD-10-CM

## 2016-11-01 DIAGNOSIS — M7712 Lateral epicondylitis, left elbow: Secondary | ICD-10-CM | POA: Diagnosis not present

## 2016-11-01 DIAGNOSIS — I10 Essential (primary) hypertension: Secondary | ICD-10-CM | POA: Diagnosis not present

## 2016-11-01 LAB — CBC WITH DIFFERENTIAL/PLATELET
BASOS ABS: 0 {cells}/uL (ref 0–200)
Basophils Relative: 0 %
EOS PCT: 2 %
Eosinophils Absolute: 128 cells/uL (ref 15–500)
HCT: 40 % (ref 38.5–50.0)
Hemoglobin: 13.9 g/dL (ref 13.2–17.1)
Lymphocytes Relative: 32 %
Lymphs Abs: 2048 cells/uL (ref 850–3900)
MCH: 33.7 pg — ABNORMAL HIGH (ref 27.0–33.0)
MCHC: 34.8 g/dL (ref 32.0–36.0)
MCV: 96.9 fL (ref 80.0–100.0)
MONOS PCT: 12 %
MPV: 8.4 fL (ref 7.5–12.5)
Monocytes Absolute: 768 cells/uL (ref 200–950)
NEUTROS ABS: 3456 {cells}/uL (ref 1500–7800)
NEUTROS PCT: 54 %
PLATELETS: 172 10*3/uL (ref 140–400)
RBC: 4.13 MIL/uL — ABNORMAL LOW (ref 4.20–5.80)
RDW: 13.6 % (ref 11.0–15.0)
WBC: 6.4 10*3/uL (ref 4.0–10.5)

## 2016-11-01 NOTE — Progress Notes (Signed)
Subjective:    Patient ID: [name] [name], male    DOB: Dec 01, 1975, 41 y.o.   MRN: [id number]  HPI He is here for complete examination. He has had some if occultly with left elbow discomfort especially since he has a new job that does require more lifting and general increased physical activity. He is also noted some slight back discomfort with this. He recently had blood work which did show HIV being negative as well as the syphilis testing. He has a previous history of secondary syphilis but most recent tests have been negative. He continues to do well on prep. He continues to smoke and is not interested in quitting. He has had no chest pain, shortness breath, PND. He continues on atorvastatin without difficulty. He is also taking lisinopril, metoprolol. He has not seen cardiology in quite some time. Does have an underlying history of MI. Is also family history for prostate cancer. He is having no difficulty with anxiety but does have a previous history of this. Review of Systems  Allergic/Immunologic: Food allergies: Truvada will be renewed.  All other systems reviewed and are negative.      Objective:   Physical Exam BP 112/70   Pulse 74   Ht 5' 4"  (1.626 m)   Wt 153 lb 9.6 oz (69.7 kg)   SpO2 97%   BMI 26.37 kg/m   General Appearance:    Alert, cooperative, no distress, appears stated age  Head:    Normocephalic, without obvious abnormality, atraumatic  Eyes:    PERRL, conjunctiva/corneas clear, EOM's intact, fundi    benign  Ears:    Normal TM's and external ear canals  Nose:   Nares normal, mucosa normal, no drainage or sinus   tenderness  Throat:   Lips, mucosa, and tongue normal; teeth and gums normal  Neck:   Supple, no lymphadenopathy;  thyroid:  no   enlargement/tenderness/nodules; no carotid   bruit or JVD  Back:    Spine nontender, no curvature, ROM normal  Lungs:     Clear to auscultation bilaterally without wheezes, rales or     ronchi; respirations unlabored      Heart:    Regular rate and rhythm, S1 and S2 normal, no murmur, rub   or gallop     Abdomen:     Soft, non-tender, nondistended, normoactive bowel sounds,    no masses, no hepatosplenomegaly  Genitalia:    Normal male external genitalia without lesions.  Testicles without masses.  No inguinal hernias.  Rectal:   Deferred stool cards given.   Extremities:   No clubbing, cyanosis or edema. Tender to palpation over the lateral epicondyle on the left. Full motion of the elbow.   Pulses:   2+ and symmetric all extremities  Skin:   Skin color, texture, turgor normal, no rashes or lesions  Lymph nodes:   Cervical, supraclavicular, and axillary nodes normal  Neurologic:   CNII-XII intact, normal strength, sensation and gait; reflexes 2+ and symmetric throughout          Psych:   Normal mood, affect, hygiene and grooming.          Assessment & Plan:  Routine general medical examination at a health care facility - Plan: CBC with Differential/Platelet, Comprehensive metabolic panel, Lipid panel  Need for influenza vaccination - Plan: Flu Vaccine QUAD 6+ mos PF IM (Fluarix Quad PF)  Family history of prostate cancer  Essential hypertension - Plan: CBC with Differential/Platelet, Comprehensive metabolic panel  High risk  sexual behavior  Hyperlipidemia, unspecified hyperlipidemia type - Plan: Lipid panel  Coronary artery disease involving native coronary artery of native heart without angina pectoris - Plan: CBC with Differential/Platelet, Comprehensive metabolic panel, Lipid panel  History of syphilis  Tobacco use disorder  Lateral epicondylitis of left elbow Discussed his family history of prostate cancer. We will re-evaluate at a later date. He will continue on his blood pressure as well as cholesterol medications. Encouraged the use of condoms and will renew his Truvada. Encouraged him to take good care of himself in regard to diet and exercise. He will call when he is ready to quit  smoking. Discussed treatment of the epicondylitis in regard to doing his main things as he can palms up and open. Also encouraged him to live properly by keeping his back straight and turning his entire body rather than rotating. Stool guaiac cards given.

## 2016-11-01 NOTE — Patient Instructions (Signed)
Do his main things as you can palms up and open

## 2016-11-02 LAB — COMPREHENSIVE METABOLIC PANEL
ALT: 53 U/L — ABNORMAL HIGH (ref 9–46)
AST: 56 U/L — ABNORMAL HIGH (ref 10–40)
Albumin: 4.4 g/dL (ref 3.6–5.1)
Alkaline Phosphatase: 84 U/L (ref 40–115)
BUN: 7 mg/dL (ref 7–25)
CHLORIDE: 104 mmol/L (ref 98–110)
CO2: 25 mmol/L (ref 20–32)
CREATININE: 0.61 mg/dL (ref 0.60–1.35)
Calcium: 9.2 mg/dL (ref 8.6–10.3)
GLUCOSE: 87 mg/dL (ref 65–99)
Potassium: 3.8 mmol/L (ref 3.5–5.3)
SODIUM: 140 mmol/L (ref 135–146)
Total Bilirubin: 0.9 mg/dL (ref 0.2–1.2)
Total Protein: 6.8 g/dL (ref 6.1–8.1)

## 2016-11-02 LAB — LIPID PANEL
Cholesterol: 119 mg/dL (ref <200)
HDL: 70 mg/dL (ref >40)
LDL CALC: 37 mg/dL (ref <100)
Total CHOL/HDL Ratio: 1.7 Ratio (ref <5.0)
Triglycerides: 62 mg/dL (ref <150)
VLDL: 12 mg/dL (ref <30)

## 2016-11-03 LAB — HEPATITIS B CORE ANTIBODY, TOTAL: HEP B C TOTAL AB: NONREACTIVE

## 2016-11-04 LAB — HEPATITIS C INFECTED PATIENT,TREATMENT PANEL
ALT: 53 U/L — ABNORMAL HIGH (ref 9–46)
BASOS ABS: 0 {cells}/uL (ref 0–200)
Basophils Relative: 0 %
CREATININE: 0.65 mg/dL (ref 0.60–1.35)
EOS PCT: 1 %
Eosinophils Absolute: 65 cells/uL (ref 15–500)
HCV QUANT LOG: NOT DETECTED {Log_IU}/mL
HCV Quantitative: <15 IU/mL
HEMATOCRIT: 42.3 % (ref 38.5–50.0)
HEMOGLOBIN: 14.1 g/dL (ref 13.2–17.1)
LYMPHS PCT: 32 %
Lymphs Abs: 2080 cells/uL (ref 850–3900)
MCH: 33.8 pg — AB (ref 27.0–33.0)
MCHC: 33.3 g/dL (ref 32.0–36.0)
MCV: 101.4 fL — ABNORMAL HIGH (ref 80.0–100.0)
MONOS PCT: 9 %
MPV: 8.7 fL (ref 7.5–12.5)
Monocytes Absolute: 585 cells/uL (ref 200–950)
NEUTROS PCT: 58 %
Neutro Abs: 3770 cells/uL (ref 1500–7800)
Platelets: 184 10*3/uL (ref 140–400)
RBC: 4.17 MIL/uL — ABNORMAL LOW (ref 4.20–5.80)
RDW: 14.3 % (ref 11.0–15.0)
WBC: 6.5 10*3/uL (ref 4.0–10.5)

## 2016-11-11 ENCOUNTER — Other Ambulatory Visit: Payer: Self-pay | Admitting: Family Medicine

## 2016-11-11 ENCOUNTER — Telehealth: Payer: Self-pay | Admitting: Family Medicine

## 2016-11-11 DIAGNOSIS — F418 Other specified anxiety disorders: Secondary | ICD-10-CM

## 2016-11-11 DIAGNOSIS — Z7251 High risk heterosexual behavior: Secondary | ICD-10-CM

## 2016-11-11 MED ORDER — EMTRICITABINE-TENOFOVIR DF 200-300 MG PO TABS: 1.0000 | ORAL_TABLET | Freq: Every day | ORAL | 0 refills | Status: DC

## 2016-11-11 MED ORDER — ALPRAZOLAM 0.5 MG PO TABS: ORAL_TABLET | ORAL | 0 refills | Status: DC

## 2016-11-11 NOTE — Telephone Encounter (Signed)
Pt called requesting  A a refill on his truvada and his xanax pt uses [facility] [address], [place] pt can be reached at [phone]

## 2016-11-11 NOTE — Telephone Encounter (Signed)
Called in xanax. [name] [name]

## 2016-12-15 ENCOUNTER — Encounter: Payer: Self-pay | Admitting: Internal Medicine

## 2017-02-07 ENCOUNTER — Other Ambulatory Visit: Payer: Self-pay | Admitting: Family Medicine

## 2017-02-07 DIAGNOSIS — Z7251 High risk heterosexual behavior: Secondary | ICD-10-CM

## 2017-02-07 NOTE — Telephone Encounter (Signed)
LMTCB

## 2017-02-07 NOTE — Telephone Encounter (Signed)
Needs an appt

## 2017-02-09 NOTE — Telephone Encounter (Signed)
Spoke with pt- he states that he sees the [facility] right now for management of Truvada. Will deny rx.

## 2017-02-28 ENCOUNTER — Other Ambulatory Visit: Payer: Self-pay | Admitting: Family Medicine

## 2017-02-28 DIAGNOSIS — F418 Other specified anxiety disorders: Secondary | ICD-10-CM

## 2017-03-15 ENCOUNTER — Encounter: Payer: Self-pay | Admitting: Family Medicine

## 2017-03-15 ENCOUNTER — Ambulatory Visit (INDEPENDENT_AMBULATORY_CARE_PROVIDER_SITE_OTHER): Payer: Managed Care, Other (non HMO) | Admitting: Family Medicine

## 2017-03-15 DIAGNOSIS — J209 Acute bronchitis, unspecified: Secondary | ICD-10-CM | POA: Diagnosis not present

## 2017-03-15 DIAGNOSIS — F17200 Nicotine dependence, unspecified, uncomplicated: Secondary | ICD-10-CM | POA: Diagnosis not present

## 2017-03-15 MED ORDER — AZITHROMYCIN 500 MG PO TABS
500.0000 mg | ORAL_TABLET | Freq: Every day | ORAL | 0 refills | Status: DC
Start: 2017-03-15 — End: 2017-10-27

## 2017-03-15 NOTE — Progress Notes (Signed)
   Subjective:    Patient ID: [name] [name], male    DOB: 02/05/1976, 42 y.o.   MRN: [id number]  HPI He complains of a 10-day history of started with a dry cough, rhinorrhea and occasional shortness of breath.  He also complains of some right rib soreness.  No sore throat, earache, fever, chills, allergies or asthma  history.  He does smoke and has cut back.  He usually smokes a pack per day.  He states that he thinks the symptoms are getting worse and interfering with sleep.   Review of Systems     Objective:   Physical Exam Alert and in no distress. Tympanic membranes and canals are normal. Pharyngeal area is normal. Neck is supple without adenopathy or thyromegaly. Cardiac exam shows a regular sinus rhythm without murmurs or gallops. Lungs are clear to auscultation.       Assessment & Plan:  Acute bronchitis, unspecified organism - Plan: azithromycin (ZITHROMAX) 500 MG tablet  Tobacco use disorder  He is to call me in 1 week if not entirely back to normal.  Discussed possibly giving him another azithromycin. I then discussed smoking cessation with him.  Discussed habit versus addiction.  Gave him the 800 number to call or the website.  Told him to keep in touch with me and I will continue to work with him concerning that.

## 2017-03-15 NOTE — Patient Instructions (Signed)
[email or web address] or quit [email or web address]

## 2017-04-20 ENCOUNTER — Other Ambulatory Visit: Payer: Self-pay | Admitting: Family Medicine

## 2017-04-20 DIAGNOSIS — F418 Other specified anxiety disorders: Secondary | ICD-10-CM

## 2017-04-20 NOTE — Telephone Encounter (Signed)
Can pt have a refill on meds.

## 2017-04-20 NOTE — Telephone Encounter (Signed)
yours

## 2017-04-21 MED ORDER — ALPRAZOLAM 0.5 MG PO TABS: ORAL_TABLET | ORAL | 0 refills | Status: DC

## 2017-04-21 NOTE — Addendum Note (Signed)
Addended by: Denita Lung on: 04/21/2017 08:17 AM   Modules accepted: Orders

## 2017-07-04 ENCOUNTER — Other Ambulatory Visit: Payer: Self-pay | Admitting: Family Medicine

## 2017-07-04 DIAGNOSIS — F418 Other specified anxiety disorders: Secondary | ICD-10-CM

## 2017-07-04 NOTE — Telephone Encounter (Signed)
Is this okay to refill? 

## 2017-07-08 ENCOUNTER — Telehealth: Payer: Self-pay | Admitting: Family Medicine

## 2017-07-08 NOTE — Telephone Encounter (Signed)
Pt called and states he is in [facility] working and forgot his Xanax at home & wanted a refill while he was there called into [facility] t# [phone].  Spoke with Dr. Redmond School and he states no, to advise pt can't send in refill out of state.   Called pt and informed.

## 2017-08-02 ENCOUNTER — Telehealth: Payer: Self-pay | Admitting: Family Medicine

## 2017-08-02 DIAGNOSIS — F418 Other specified anxiety disorders: Secondary | ICD-10-CM

## 2017-08-02 MED ORDER — ALPRAZOLAM 0.5 MG PO TABS: ORAL_TABLET | ORAL | 0 refills | Status: DC

## 2017-08-02 NOTE — Telephone Encounter (Signed)
Pt called and is needing a refill on his xanax pt uses [facility] [address], [place] pt can be reached at [phone] would like to be called when this is called in,

## 2017-08-17 ENCOUNTER — Other Ambulatory Visit: Payer: Self-pay | Admitting: Family Medicine

## 2017-08-17 DIAGNOSIS — E785 Hyperlipidemia, unspecified: Secondary | ICD-10-CM

## 2017-08-17 DIAGNOSIS — I2510 Atherosclerotic heart disease of native coronary artery without angina pectoris: Secondary | ICD-10-CM

## 2017-08-17 DIAGNOSIS — I10 Essential (primary) hypertension: Secondary | ICD-10-CM

## 2017-09-19 ENCOUNTER — Other Ambulatory Visit: Payer: Self-pay | Admitting: Family Medicine

## 2017-09-19 DIAGNOSIS — F418 Other specified anxiety disorders: Secondary | ICD-10-CM

## 2017-09-19 NOTE — Telephone Encounter (Signed)
[facility] is requesting to fill pt XanaX. Please advise [facility]

## 2017-09-19 NOTE — Telephone Encounter (Signed)
He doesn't have a physical scheduled, due in August. Once appt scheduled with JCL, okay to refill #20.

## 2017-09-20 NOTE — Telephone Encounter (Signed)
Dr Redmond School Patient Is this ok to refill?

## 2017-09-20 NOTE — Telephone Encounter (Signed)
See Dr. Johnsie Kindred note

## 2017-10-27 ENCOUNTER — Encounter: Payer: Self-pay | Admitting: Family Medicine

## 2017-10-27 ENCOUNTER — Ambulatory Visit: Payer: Managed Care, Other (non HMO) | Admitting: Family Medicine

## 2017-10-27 VITALS — BP 110/74 | HR 95 | Temp 97.9°F | Ht 64.0 in | Wt 148.2 lb

## 2017-10-27 DIAGNOSIS — Z0000 Encounter for general adult medical examination without abnormal findings: Secondary | ICD-10-CM | POA: Diagnosis not present

## 2017-10-27 DIAGNOSIS — F410 Panic disorder [episodic paroxysmal anxiety] without agoraphobia: Secondary | ICD-10-CM

## 2017-10-27 DIAGNOSIS — Z8249 Family history of ischemic heart disease and other diseases of the circulatory system: Secondary | ICD-10-CM | POA: Diagnosis not present

## 2017-10-27 DIAGNOSIS — F10230 Alcohol dependence with withdrawal, uncomplicated: Secondary | ICD-10-CM | POA: Insufficient documentation

## 2017-10-27 DIAGNOSIS — F1090 Alcohol use, unspecified, uncomplicated: Secondary | ICD-10-CM

## 2017-10-27 DIAGNOSIS — I2510 Atherosclerotic heart disease of native coronary artery without angina pectoris: Secondary | ICD-10-CM

## 2017-10-27 DIAGNOSIS — F17200 Nicotine dependence, unspecified, uncomplicated: Secondary | ICD-10-CM

## 2017-10-27 DIAGNOSIS — Z23 Encounter for immunization: Secondary | ICD-10-CM

## 2017-10-27 DIAGNOSIS — I10 Essential (primary) hypertension: Secondary | ICD-10-CM | POA: Diagnosis not present

## 2017-10-27 DIAGNOSIS — Z7252 High risk homosexual behavior: Secondary | ICD-10-CM

## 2017-10-27 DIAGNOSIS — Z8619 Personal history of other infectious and parasitic diseases: Secondary | ICD-10-CM | POA: Diagnosis not present

## 2017-10-27 DIAGNOSIS — Z7289 Other problems related to lifestyle: Secondary | ICD-10-CM

## 2017-10-27 DIAGNOSIS — E785 Hyperlipidemia, unspecified: Secondary | ICD-10-CM

## 2017-10-27 LAB — POCT URINALYSIS DIP (PROADVANTAGE DEVICE)
BILIRUBIN UA: NEGATIVE mg/dL
Blood, UA: NEGATIVE
Glucose, UA: NEGATIVE mg/dL
LEUKOCYTES UA: NEGATIVE
Nitrite, UA: NEGATIVE
PH UA: 6 (ref 5.0–8.0)

## 2017-10-27 MED ORDER — ATORVASTATIN CALCIUM 40 MG PO TABS: ORAL_TABLET | ORAL | 2 refills | Status: DC

## 2017-10-27 MED ORDER — METOPROLOL TARTRATE 25 MG PO TABS: 25.0000 mg | ORAL_TABLET | Freq: Two times a day (BID) | ORAL | 2 refills | Status: DC

## 2017-10-27 MED ORDER — LISINOPRIL 5 MG PO TABS: 5.0000 mg | ORAL_TABLET | Freq: Every day | ORAL | 2 refills | Status: DC

## 2017-10-27 NOTE — Progress Notes (Signed)
Subjective:    Patient ID: [name] [name], male    DOB: 12-Dec-1975, 42 y.o.   MRN: [id number]  HPI He is here for complete examination.  His main complaint today is that he is just not happy.  He has had episodes of crying as well as anhedonia.  He is also noted increased difficulty with fear of heights and now also with driving.  He is now starting to limit some of his driving.  He is now going to the prep clinic through the health department.  He does have a previous history of syphilis.  He admits to alcohol consumption and states that he has cut back somewhat but is not quite ready to quit.  He continues to smoke and again not interested in quitting.  He has had no chest pain, shortness of breath, PND or DOE.  Continues on atorvastatin without difficulty.  He is also taking lisinopril as well as metoprolol.  Continues on Truvada.   Review of Systems  All other systems reviewed and are negative.      Objective:   Physical Exam BP 110/74   Pulse 95   Temp 97.9 F (36.6 C) (Oral)   Ht 5' 4"  (1.626 m)   Wt 148 lb 3.2 oz (67.2 kg)   SpO2 96%   BMI 25.44 kg/m   General Appearance:    Alert, cooperative, no distress, appears stated age  Head:    Normocephalic, without obvious abnormality, atraumatic  Eyes:    PERRL, conjunctiva/corneas clear, EOM's intact, fundi    benign  Ears:    Normal TM's and external ear canals  Nose:   Nares normal, mucosa normal, no drainage or sinus   tenderness  Throat:   Lips, mucosa, and tongue normal; teeth and gums normal  Neck:   Supple, no lymphadenopathy;  thyroid:  no   enlargement/tenderness/nodules; no carotid   bruit or JVD     Lungs:     Clear to auscultation bilaterally without wheezes, rales or     ronchi; respirations unlabored      Heart:    Regular rate and rhythm, S1 and S2 normal, no murmur, rub   or gallop     Abdomen:     Soft, non-tender, nondistended, normoactive bowel sounds,    no masses, no hepatosplenomegaly  Genitalia:     Normal male external genitalia without lesions.  Testicles without masses.  No inguinal hernias.  Rectal:   Deferred  Extremities:   No clubbing, cyanosis or edema  Pulses:   2+ and symmetric all extremities  Skin:   Skin color, texture, turgor normal, no rashes or lesions  Lymph nodes:   Cervical, supraclavicular, and axillary nodes normal  Neurologic:   CNII-XII intact, normal strength, sensation and gait; reflexes 2+ and symmetric throughout          Psych:   Normal mood, affect, hygiene and grooming.          Assessment & Plan:  Routine general medical examination at a health care facility - Plan: CBC with Differential/Platelet, Comprehensive metabolic panel, Lipid panel, POCT Urinalysis DIP (Proadvantage Device)  Tobacco use disorder  High risk homosexual behavior  Coronary artery disease involving native coronary artery of native heart without angina pectoris - Plan: lisinopril (PRINIVIL,ZESTRIL) 5 MG tablet, metoprolol tartrate (LOPRESSOR) 25 MG tablet, Lipid panel  Family history of premature CAD  Hyperlipidemia, unspecified hyperlipidemia type - Plan: atorvastatin (LIPITOR) 40 MG tablet, Lipid panel  History of syphilis  Essential hypertension -  Plan: lisinopril (PRINIVIL,ZESTRIL) 5 MG tablet, metoprolol tartrate (LOPRESSOR) 25 MG tablet, CBC with Differential/Platelet, Comprehensive metabolic panel  Need for vaccination against Streptococcus pneumoniae - Plan: Pneumococcal conjugate vaccine 13-valent  Need for influenza vaccination - Plan: Flu Vaccine QUAD 6+ mos PF IM (Fluarix Quad PF)  Alcohol drinking problem  Panic disorder I again counseled him on smoking cessation.  We also discussed alcohol consumption and I strongly encouraged him to back off on that completely.  Discussed getting counseling for him since he does have symptoms of panic as well as anhedonia.  He will be referred to Durene Romans.  Continue on all of his other medications.  His immunizations  were updated.  Guaiac cards also given.

## 2017-10-27 NOTE — Patient Instructions (Signed)
Call [name] [name] 292 [phone]

## 2017-10-28 LAB — CBC WITH DIFFERENTIAL/PLATELET
Basophils Absolute: 0 10*3/uL (ref 0.0–0.2)
Basos: 0 %
EOS (ABSOLUTE): 0.1 10*3/uL (ref 0.0–0.4)
EOS: 1 %
HEMATOCRIT: 43.5 % (ref 37.5–51.0)
HEMOGLOBIN: 15.3 g/dL (ref 13.0–17.7)
IMMATURE GRANS (ABS): 0 10*3/uL (ref 0.0–0.1)
Immature Granulocytes: 0 %
LYMPHS: 32 %
Lymphocytes Absolute: 2.1 10*3/uL (ref 0.7–3.1)
MCH: 34.7 pg — ABNORMAL HIGH (ref 26.6–33.0)
MCHC: 35.2 g/dL (ref 31.5–35.7)
MCV: 99 fL — ABNORMAL HIGH (ref 79–97)
MONOCYTES: 7 %
Monocytes Absolute: 0.5 10*3/uL (ref 0.1–0.9)
Neutrophils Absolute: 3.8 10*3/uL (ref 1.4–7.0)
Neutrophils: 60 %
Platelets: 181 10*3/uL (ref 150–450)
RBC: 4.41 x10E6/uL (ref 4.14–5.80)
RDW: 14.2 % (ref 12.3–15.4)
WBC: 6.5 10*3/uL (ref 3.4–10.8)

## 2017-10-28 LAB — COMPREHENSIVE METABOLIC PANEL
ALBUMIN: 4.5 g/dL (ref 3.5–5.5)
ALT: 40 IU/L (ref 0–44)
AST: 58 IU/L — ABNORMAL HIGH (ref 0–40)
Albumin/Globulin Ratio: 1.7 (ref 1.2–2.2)
Alkaline Phosphatase: 94 IU/L (ref 39–117)
BUN / CREAT RATIO: 9 (ref 9–20)
BUN: 6 mg/dL (ref 6–24)
Bilirubin Total: 0.4 mg/dL (ref 0.0–1.2)
CO2: 19 mmol/L — AB (ref 20–29)
CREATININE: 0.69 mg/dL — AB (ref 0.76–1.27)
Calcium: 9.2 mg/dL (ref 8.7–10.2)
Chloride: 105 mmol/L (ref 96–106)
GFR, EST AFRICAN AMERICAN: 133 mL/min/{1.73_m2} (ref >59)
GFR, EST NON AFRICAN AMERICAN: 115 mL/min/{1.73_m2} (ref >59)
GLOBULIN, TOTAL: 2.6 g/dL (ref 1.5–4.5)
GLUCOSE: 103 mg/dL — AB (ref 65–99)
Potassium: 4 mmol/L (ref 3.5–5.2)
Sodium: 142 mmol/L (ref 134–144)
TOTAL PROTEIN: 7.1 g/dL (ref 6.0–8.5)

## 2017-10-28 LAB — LIPID PANEL
CHOL/HDL RATIO: 1.6 ratio (ref 0.0–5.0)
CHOLESTEROL TOTAL: 120 mg/dL (ref 100–199)
HDL: 73 mg/dL (ref >39)
LDL CALC: 29 mg/dL (ref 0–99)
Triglycerides: 89 mg/dL (ref 0–149)
VLDL Cholesterol Cal: 18 mg/dL (ref 5–40)

## 2017-11-10 ENCOUNTER — Emergency Department (HOSPITAL_COMMUNITY): Payer: Managed Care, Other (non HMO)

## 2017-11-10 ENCOUNTER — Encounter (HOSPITAL_COMMUNITY): Payer: Self-pay | Admitting: Pharmacy Technician

## 2017-11-10 ENCOUNTER — Other Ambulatory Visit: Payer: Self-pay

## 2017-11-10 ENCOUNTER — Observation Stay (HOSPITAL_COMMUNITY)
Admission: EM | Admit: 2017-11-10 | Discharge: 2017-11-11 | Disposition: A | Discharge status: Home / Self Care | Payer: Managed Care, Other (non HMO) | Attending: Internal Medicine | Admitting: Internal Medicine

## 2017-11-10 DIAGNOSIS — I110 Hypertensive heart disease with heart failure: Secondary | ICD-10-CM | POA: Diagnosis not present

## 2017-11-10 DIAGNOSIS — F1010 Alcohol abuse, uncomplicated: Secondary | ICD-10-CM | POA: Diagnosis not present

## 2017-11-10 DIAGNOSIS — Z7251 High risk heterosexual behavior: Secondary | ICD-10-CM | POA: Diagnosis not present

## 2017-11-10 DIAGNOSIS — R079 Chest pain, unspecified: Secondary | ICD-10-CM | POA: Diagnosis present

## 2017-11-10 DIAGNOSIS — R0789 Other chest pain: Principal | ICD-10-CM | POA: Insufficient documentation

## 2017-11-10 DIAGNOSIS — Z7289 Other problems related to lifestyle: Secondary | ICD-10-CM

## 2017-11-10 DIAGNOSIS — Z955 Presence of coronary angioplasty implant and graft: Secondary | ICD-10-CM | POA: Insufficient documentation

## 2017-11-10 DIAGNOSIS — R072 Precordial pain: Secondary | ICD-10-CM

## 2017-11-10 DIAGNOSIS — I5022 Chronic systolic (congestive) heart failure: Secondary | ICD-10-CM | POA: Diagnosis not present

## 2017-11-10 DIAGNOSIS — Z79899 Other long term (current) drug therapy: Secondary | ICD-10-CM | POA: Insufficient documentation

## 2017-11-10 DIAGNOSIS — F10230 Alcohol dependence with withdrawal, uncomplicated: Secondary | ICD-10-CM

## 2017-11-10 DIAGNOSIS — F419 Anxiety disorder, unspecified: Secondary | ICD-10-CM | POA: Insufficient documentation

## 2017-11-10 DIAGNOSIS — Z7982 Long term (current) use of aspirin: Secondary | ICD-10-CM | POA: Insufficient documentation

## 2017-11-10 DIAGNOSIS — I2510 Atherosclerotic heart disease of native coronary artery without angina pectoris: Secondary | ICD-10-CM | POA: Diagnosis present

## 2017-11-10 DIAGNOSIS — Z7252 High risk homosexual behavior: Secondary | ICD-10-CM

## 2017-11-10 DIAGNOSIS — I10 Essential (primary) hypertension: Secondary | ICD-10-CM | POA: Diagnosis present

## 2017-11-10 DIAGNOSIS — F17200 Nicotine dependence, unspecified, uncomplicated: Secondary | ICD-10-CM | POA: Diagnosis not present

## 2017-11-10 DIAGNOSIS — E785 Hyperlipidemia, unspecified: Secondary | ICD-10-CM | POA: Diagnosis not present

## 2017-11-10 DIAGNOSIS — F17210 Nicotine dependence, cigarettes, uncomplicated: Secondary | ICD-10-CM | POA: Insufficient documentation

## 2017-11-10 DIAGNOSIS — I252 Old myocardial infarction: Secondary | ICD-10-CM | POA: Diagnosis not present

## 2017-11-10 LAB — RAPID URINE DRUG SCREEN, HOSP PERFORMED
AMPHETAMINES: NOT DETECTED
Barbiturates: NOT DETECTED
Benzodiazepines: NOT DETECTED
Cocaine: NOT DETECTED
OPIATES: NOT DETECTED
Tetrahydrocannabinol: NOT DETECTED

## 2017-11-10 LAB — BASIC METABOLIC PANEL
ANION GAP: 13 (ref 5–15)
BUN: 10 mg/dL (ref 6–20)
CHLORIDE: 106 mmol/L (ref 98–111)
CO2: 21 mmol/L — ABNORMAL LOW (ref 22–32)
Calcium: 8.9 mg/dL (ref 8.9–10.3)
Creatinine, Ser: 0.69 mg/dL (ref 0.61–1.24)
GFR calc Af Amer: >60 mL/min (ref >60)
GLUCOSE: 95 mg/dL (ref 70–99)
POTASSIUM: 3.8 mmol/L (ref 3.5–5.1)
Sodium: 140 mmol/L (ref 135–145)

## 2017-11-10 LAB — CBC
HEMATOCRIT: 44 % (ref 39.0–52.0)
HEMOGLOBIN: 14.8 g/dL (ref 13.0–17.0)
MCH: 34.6 pg — ABNORMAL HIGH (ref 26.0–34.0)
MCHC: 33.6 g/dL (ref 30.0–36.0)
MCV: 102.8 fL — AB (ref 78.0–100.0)
Platelets: 167 10*3/uL (ref 150–400)
RBC: 4.28 MIL/uL (ref 4.22–5.81)
RDW: 13.1 % (ref 11.5–15.5)
WBC: 6.6 10*3/uL (ref 4.0–10.5)

## 2017-11-10 LAB — TROPONIN I

## 2017-11-10 LAB — TSH: TSH: 2.142 u[IU]/mL (ref 0.350–4.500)

## 2017-11-10 LAB — I-STAT TROPONIN, ED
TROPONIN I, POC: 0.02 ng/mL (ref 0.00–0.08)
Troponin i, poc: 0.01 ng/mL (ref 0.00–0.08)

## 2017-11-10 MED ORDER — ASPIRIN EC 81 MG PO TBEC
81.0000 mg | DELAYED_RELEASE_TABLET | Freq: Every day | ORAL | Status: DC
  Administered 2017-11-11: 81 mg via ORAL
  Filled 2017-11-10: qty 1

## 2017-11-10 MED ORDER — NICOTINE 14 MG/24HR TD PT24
14.0000 mg | MEDICATED_PATCH | Freq: Every day | TRANSDERMAL | Status: DC
  Administered 2017-11-10 – 2017-11-11 (×2): 14 mg via TRANSDERMAL
  Filled 2017-11-10 (×2): qty 1

## 2017-11-10 MED ORDER — GI COCKTAIL ~~LOC~~: 30.0000 mL | Freq: Four times a day (QID) | ORAL | Status: DC | PRN

## 2017-11-10 MED ORDER — LISINOPRIL 5 MG PO TABS
5.0000 mg | ORAL_TABLET | Freq: Every day | ORAL | Status: DC
  Administered 2017-11-11: 5 mg via ORAL
  Filled 2017-11-10: qty 1

## 2017-11-10 MED ORDER — ATORVASTATIN CALCIUM 40 MG PO TABS
40.0000 mg | ORAL_TABLET | Freq: Every day | ORAL | Status: DC
  Administered 2017-11-11: 40 mg via ORAL
  Filled 2017-11-10: qty 1

## 2017-11-10 MED ORDER — ACETAMINOPHEN 325 MG PO TABS: 650.0000 mg | ORAL_TABLET | ORAL | Status: DC | PRN

## 2017-11-10 MED ORDER — MORPHINE SULFATE (PF) 2 MG/ML IV SOLN: 2.0000 mg | INTRAVENOUS | Status: DC | PRN

## 2017-11-10 MED ORDER — ONDANSETRON HCL 4 MG/2ML IJ SOLN: 4.0000 mg | Freq: Four times a day (QID) | INTRAMUSCULAR | Status: DC | PRN

## 2017-11-10 MED ORDER — ENOXAPARIN SODIUM 40 MG/0.4ML ~~LOC~~ SOLN
40.0000 mg | SUBCUTANEOUS | Status: DC
  Administered 2017-11-10: 40 mg via SUBCUTANEOUS
  Filled 2017-11-10: qty 0.4

## 2017-11-10 MED ORDER — EMTRICITABINE-TENOFOVIR AF 200-25 MG PO TABS
1.0000 | ORAL_TABLET | Freq: Every day | ORAL | Status: DC
  Filled 2017-11-10: qty 1

## 2017-11-10 MED ORDER — ALPRAZOLAM 0.5 MG PO TABS: 0.5000 mg | ORAL_TABLET | Freq: Two times a day (BID) | ORAL | Status: DC | PRN

## 2017-11-10 MED ORDER — METOPROLOL TARTRATE 25 MG PO TABS
25.0000 mg | ORAL_TABLET | Freq: Two times a day (BID) | ORAL | Status: DC
  Administered 2017-11-10 – 2017-11-11 (×2): 25 mg via ORAL
  Filled 2017-11-10 (×2): qty 1

## 2017-11-10 NOTE — ED Triage Notes (Signed)
Pt bib EMS from work with reports of L sided CP with radiation of numbness into L arm. Endorses lightheadedness as well. Hx MI with stent placement. 124/80, HR 70 NSR, RR 18. 1 NTG and 344m ASA given by EMS.

## 2017-11-10 NOTE — H&P (Signed)
History and Physical    [name] [name] JGG:[id number] DOB: 26-Oct-1975 DOA: 11/10/2017  PCP: Denita Lung, MD Consultants:  Wynonia Lawman - cardiology Patient coming from:  Home - lives alone; NOK: Heath Gold, [place]  Chief Complaint: Chest pain  HPI: [name] [name] is a 42 y.o. male with medical history significant of neurosyphilis; HTN; HLD: CAD s/p DES; systolic CHF (EF 54-65% in 3/16) and depression presenting with chest pain. He was at work today and noticed a dull pain in his left chest with radiation down his left arm.  He was going to drive himself, but he got weak and couldn't drive.  He does not feel like he was exerting himself when it started.  He took 4 ASA and a NTG with complete resolution of the pain.  It has not recurred.   ED Course:  H/o MI, stent 2016.  Severe left-sided CP with radiation, exertional.  Given ASA and NTG, resolved pain.  Will call cardiology to see patient.  Review of Systems: As per HPI; otherwise review of systems reviewed and negative.   Ambulatory Status  Ambulates without assistance  Past Medical History:  Diagnosis Date  . Anxiety   . CAD (coronary artery disease), native coronary artery 05/24/2014   Anterior infarction occurring out of Hospital approximately week prior to admission 3/17 16  Cardiac catheterization 3/18 showing a proximal 40% segmental LAD, 99% mid LAD with collaterals from the right coronary artery, normal circumflex, 20-30% mid and distal right coronary artery stenosis.  3.0 x 28 mm Synergy stent by Dr. Irish Lack post dilated to 3.25 mm (incidental note that LAD was closed at the time of the PCI a few minutes after the catheterization but with no symptoms)  Echo shows distal anterior apical and distal inferior severe hypokinesis with EF around 50%   . Coronary artery disease    a. 05/2014 OOH MI/PCI: LM nl, LAD 40p, 99p (3.0x28 Synergy DES) w/ R->L Collaterals, LCX nl, RCA 30p/d, EF 55% w/ sev distal inf/ant HK.  . Depression   .  Erectile dysfunction   . H/O echocardiogram    a. 05/2014 Echo: Ef 45-50%, sev mid antsept/ant HK.  Marland Kitchen Hyperlipidemia   . Hypertension    medication in 2012, stress as a big component  . MI (myocardial infarction) ([place])   . 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    . Tobacco abuse     Past Surgical History:  Procedure Laterality Date  . CORONARY STENT PLACEMENT  05/24/2014   MID LAD DES   . LEFT HEART CATHETERIZATION WITH CORONARY ANGIOGRAM N/A 05/24/2014   Procedure: LEFT HEART CATHETERIZATION WITH CORONARY ANGIOGRAM;  Surgeon: Jettie Booze, MD;  Location[facility] CATH LAB;  Service: Cardiovascular;  Laterality: N/A;  . PERCUTANEOUS CORONARY STENT INTERVENTION (PCI-S)  05/24/2014   Procedure: PERCUTANEOUS CORONARY STENT INTERVENTION (PCI-S);  Surgeon: Jettie Booze, MD;  Location: [facility] CATH LAB;  Service: Cardiovascular;;  . PILONIDAL CYST EXCISION  1995    Social History   Socioeconomic History  . Marital status: Single    Spouse name: Not on file  . Number of children: Not on file  . Years of education: Not on file  . Highest education level: Not on file  Occupational History  . Occupation: Librarian, academic  Social Needs  . Financial resource strain: Not on file  . Food insecurity:    Worry: Not on file    Inability: Not on file  . Transportation needs:    Medical:  Not on file    Non-medical: Not on file  Tobacco Use  . Smoking status: Current Every Day Smoker    Packs/day: 1.00    Years: 26.00    Pack years: 26.00    Types: Cigarettes  . Smokeless tobacco: Never Used  Substance and Sexual Activity  . Alcohol use: Yes    Alcohol/week: 24.0 standard drinks    Types: 24 Cans of beer per week    Comment: "3-5 times/wk I'll have 3-5 beers; max of 24 beers/wk"  . Drug use: No  . Sexual activity: Yes    Partners: Male  Lifestyle  . Physical activity:    Days per week: Not on file    Minutes per session: Not on file  . Stress: Not on file    Relationships  . Social connections:    Talks on phone: Not on file    Gets together: Not on file    Attends religious service: Not on file    Active member of club or organization: Not on file    Attends meetings of clubs or organizations: Not on file    Relationship status: Not on file  . Intimate partner violence:    Fear of current or ex partner: Not on file    Emotionally abused: Not on file    Physically abused: Not on file    Forced sexual activity: Not on file  Other Topics Concern  . Not on file  Social History Narrative   Lives at home with roommate, exercise at gym in apartment complex, diet - no discretion.        Allergies  Allergen Reactions  . Gadolinium Derivatives Nausea Only    MRI dye    Family History  Problem Relation Age of Onset  . Thyroid disease Mother   . Heart disease Father 87       MI, stents  . Peripheral Artery Disease Father   . Diabetes type II Father   . Diabetes Father   . Diabetes Maternal Grandmother   . Hypertension Maternal Grandfather   . Hypertension Paternal Grandfather   . Cancer Neg Hx   . Stroke Neg Hx     Prior to Admission medications   Medication Sig Start Date End Date Taking? Authorizing Provider  ALPRAZolam (XANAX) 0.5 MG tablet TAKE ONE TABLET BY MOUTH TWICE A DAY AS NEEDED FOR ANXIETY Patient taking differently: Take 0.5 mg by mouth 2 (two) times daily as needed for anxiety.  09/21/17  Yes Denita Lung, MD  aspirin EC 81 MG EC tablet Take 1 tablet (81 mg total) by mouth daily. 05/25/14  Yes Theora Gianotti, NP  atorvastatin (LIPITOR) 40 MG tablet TAKE ONE TABLET BY MOUTH DAILY AT 6 PM. Patient taking differently: Take 40 mg by mouth every morning.  10/27/17  Yes Denita Lung, MD  emtricitabine-tenofovir (TRUVADA) 200-300 MG tablet Take 1 tablet by mouth daily. 11/11/16  Yes Denita Lung, MD  lisinopril (PRINIVIL,ZESTRIL) 5 MG tablet Take 1 tablet (5 mg total) by mouth daily. 10/27/17  Yes Denita Lung, MD  metoprolol tartrate (LOPRESSOR) 25 MG tablet Take 1 tablet (25 mg total) by mouth 2 (two) times daily. 10/27/17  Yes Denita Lung, MD  Multiple Vitamin (MULTIVITAMIN WITH MINERALS) TABS tablet Take 1 tablet by mouth daily.   Yes [provider]    Physical Exam: Vitals:   11/10/17 1500 11/10/17 1600 11/10/17 1700 11/10/17 1740  BP: 105/68 106/76  130/84  Pulse: 75 69 78 68  Resp: (!) 22 18 19 12   Temp:      TempSrc:      SpO2: 93% 95% 94%   Weight:      Height:         General:  Appears calm and comfortable and is NAD Eyes:  PERRL, EOMI, normal lids, iris ENT:  grossly normal hearing, lips & tongue, mmm; appropriate dentition Neck:  no LAD, masses or thyromegaly; no carotid bruits Cardiovascular:  RRR, no m/r/g. No LE edema.  Respiratory:   CTA bilaterally with no wheezes/rales/rhonchi.  Normal respiratory effort. Abdomen:  soft, NT, ND, NABS Back:   normal alignment, no CVAT Skin:  no rash or induration seen on limited exam Musculoskeletal:  grossly normal tone BUE/BLE, good ROM, no bony abnormality Lower extremity: No LE edema.  Limited foot exam with no ulcerations.  2+ distal pulses. Psychiatric:  grossly normal mood and affect, speech fluent and appropriate, AOx3 Neurologic:  CN 2-12 grossly intact, moves all extremities in coordinated fashion, sensation intact    Radiological Exams on Admission: Dg Chest 2 View  Result Date: 11/10/2017 CLINICAL DATA:  L sided CP with radiation of numbness into L arm. lightheadedness Hx MI with stent placement. 2 YRS AGO,HTN EXAM: CHEST - 2 VIEW COMPARISON:  Chest x-ray dated 08/26/2016. FINDINGS: Heart size and mediastinal contours are within normal limits. Streaky opacity at the RIGHT lung base, possibly atelectasis or superimposition of normal pulmonary vessels, more likely artifactual related to overlying soft tissues given the lack of correlate on the lateral view. Lungs otherwise clear. No pleural effusion or  pneumothorax seen. Osseous structures about the chest are unremarkable. IMPRESSION: No convincing evidence of acute cardiopulmonary abnormality, as detailed above. Probable soft tissue artifact or superimposition of normal pulmonary vessels at the RIGHT lung base, less likely atelectasis, much less likely pneumonia in the absence of fever. Electronically Signed   By: Franki Cabot M.D.   On: 11/10/2017 13:24    EKG: Independently reviewed.  NSR with rate 83; nonspecific ST changes, likely early repolarization.  Reviewed with Dr. Radford Pax, who agrees that this appears to be improved from prior.  Labs on Admission: I have personally reviewed the available labs and imaging studies at the time of the admission.  Pertinent labs:   CO2 21, otherwise BMP WNL Troponin 0.01, 0.02 Essentially normal CBC   Assessment/Plan Principal Problem:   Chest pain Active Problems:   High risk sexual behavior   Tobacco use disorder   CAD (coronary artery disease), native coronary artery   Hyperlipidemia   Hypertension   Alcohol drinking problem   Chest pain with h/o CAD -Patient with left-sided chest pain; initial ER report was that this was typical in nature, but it appears to not have been exertional and was not substernal. It did resolve with NTG. -1/3 typical symptoms suggestive of noncardiac chest pain.  -CXR unremarkable.   -Initial cardiac troponin negative x 2.  -EKG not indicative of acute ischemia.   -Will plan to place in observation status on telemetry to rule out ACS by overnight observation.  -cycle troponin q6h x 3 and repeat EKG in AM -Continue ASA 81 mg  daily -morphine given -Will also check TSH and UDS -Cardiology consultation in AM - NPO for possible stress test  -Will plan to start Heparin drip if enzymes are positive and/or chest pain recurs -Dr. Radford Pax agrees with this plan and will request that cardiology see patient tomorrow  HTN -Continue Lisinopril  and  Lopressor  HLD -Continue Lipitor -Lipids were checked on 8/20 - TC 120/HDL 73/LDL 29/TG 89  ETOH abuse -Acknowledges that he might have a problem with ETOH -Denies h/o withdrawal -Will not place on CIWA at this time but will have a low threshold for adding  Tobacco dependence -Encourage cessation.  This was discussed with the patient and should be reviewed on an ongoing basis.   -Patch ordered at patient request.  High-risk sexual behavior -Patient has male partners and h/o neurosyphilis -Continue Truvada -Recheck HIV (>1 year)   DVT prophylaxis: Lovenox  Code Status:  Full - confirmed with patient Family Communication: None present Disposition Plan:  Home once clinically improved Consults called: Cardiology  Admission status: It is my clinical opinion that referral for OBSERVATION is reasonable and necessary in this patient based on the above information provided. The aforementioned taken together are felt to place the patient at high risk for further clinical deterioration. However it is anticipated that the patient may be medically stable for discharge from the hospital within 24 to 48 hours.    Karmen Bongo MD [facility]  If note is complete, please contact covering daytime or nighttime physician. [email or web address] Password [email or web address]  11/10/2017, 7:09 PM

## 2017-11-10 NOTE — ED Notes (Signed)
Patient transported to X-ray 

## 2017-11-10 NOTE — ED Provider Notes (Signed)
[facility] EMERGENCY DEPARTMENT Provider Note   CSN: [id number] Arrival date & time: 11/10/17  1215     History   Chief Complaint Chief Complaint  Patient presents with  . Chest Pain    HPI [name] [name] is a 42 y.o. male with a past medical history of anterior MI with stint in in 2016.  States he was at work and he was moving some boxes around and felt left-sided chest pressure.  After 5 minutes he states the pain radiated to his left arm and his left arm became numb and tingly.  States he became diaphoretic and lightheaded which caused him to call EMS.  States when EMS arrived they administered nitroglycerin and aspirin. States a few minutes after the nitro was administered his chest pain and left arm numbness resolved. Pain was rated an 8 out of 10.  Denies current chest pain, shortness of breath, fever, chills, nausea, vomiting, abdominal pain.  HPI  Past Medical History:  Diagnosis Date  . Anxiety   . CAD (coronary artery disease), native coronary artery 05/24/2014   Anterior infarction occurring out of Hospital approximately week prior to admission 3/17 16  Cardiac catheterization 3/18 showing a proximal 40% segmental LAD, 99% mid LAD with collaterals from the right coronary artery, normal circumflex, 20-30% mid and distal right coronary artery stenosis.  3.0 x 28 mm Synergy stent by Dr. Irish Lack post dilated to 3.25 mm (incidental note that LAD was closed at the time of the PCI a few minutes after the catheterization but with no symptoms)  Echo shows distal anterior apical and distal inferior severe hypokinesis with EF around 50%   . Coronary artery disease    a. 05/2014 OOH MI/PCI: LM nl, LAD 40p, 99p (3.0x28 Synergy DES) w/ R->L Collaterals, LCX nl, RCA 30p/d, EF 55% w/ sev distal inf/ant HK.  . Depression   . Erectile dysfunction   . H/O echocardiogram    a. 05/2014 Echo: Ef 45-50%, sev mid antsept/ant HK.  Marland Kitchen Hyperlipidemia   . Hypertension    medication in  2012, stress as a big component  . MI (myocardial infarction) ([place])   . 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    . Tobacco abuse     Patient Active Problem List   Diagnosis Date Noted  . Chest pain 11/10/2017  . Alcohol drinking problem 10/27/2017  . Food allergy 12/24/2015  . Hyperlipidemia   . Hypertension   . CAD (coronary artery disease), native coronary artery 05/24/2014  . Family history of premature CAD 05/08/2014  . History of syphilis   . High risk sexual behavior   . Tobacco use disorder     Past Surgical History:  Procedure Laterality Date  . CORONARY STENT PLACEMENT  05/24/2014   MID LAD DES   . LEFT HEART CATHETERIZATION WITH CORONARY ANGIOGRAM N/A 05/24/2014   Procedure: LEFT HEART CATHETERIZATION WITH CORONARY ANGIOGRAM;  Surgeon: Jettie Booze, MD;  Location: [facility] CATH LAB;  Service: Cardiovascular;  Laterality: N/A;  . PERCUTANEOUS CORONARY STENT INTERVENTION (PCI-S)  05/24/2014   Procedure: PERCUTANEOUS CORONARY STENT INTERVENTION (PCI-S);  Surgeon: Jettie Booze, MD;  Location: [facility] CATH LAB;  Service: Cardiovascular;;  . PILONIDAL CYST EXCISION  1995        Home Medications    Prior to Admission medications   Medication Sig Start Date End Date Taking? Authorizing Provider  ALPRAZolam (XANAX) 0.5 MG tablet TAKE ONE TABLET BY MOUTH TWICE A DAY AS NEEDED FOR  ANXIETY Patient taking differently: Take 0.5 mg by mouth 2 (two) times daily as needed for anxiety.  09/21/17  Yes Denita Lung, MD  aspirin EC 81 MG EC tablet Take 1 tablet (81 mg total) by mouth daily. 05/25/14  Yes Theora Gianotti, NP  atorvastatin (LIPITOR) 40 MG tablet TAKE ONE TABLET BY MOUTH DAILY AT 6 PM. Patient taking differently: Take 40 mg by mouth every morning.  10/27/17  Yes Denita Lung, MD  emtricitabine-tenofovir (TRUVADA) 200-300 MG tablet Take 1 tablet by mouth daily. 11/11/16  Yes Denita Lung, MD  lisinopril (PRINIVIL,ZESTRIL) 5 MG tablet Take 1  tablet (5 mg total) by mouth daily. 10/27/17  Yes Denita Lung, MD  metoprolol tartrate (LOPRESSOR) 25 MG tablet Take 1 tablet (25 mg total) by mouth 2 (two) times daily. 10/27/17  Yes Denita Lung, MD  Multiple Vitamin (MULTIVITAMIN WITH MINERALS) TABS tablet Take 1 tablet by mouth daily.   Yes [provider]    Family History Family History  Problem Relation Age of Onset  . Thyroid disease Mother   . Heart disease Father 74       MI, stents  . Peripheral Artery Disease Father   . Diabetes type II Father   . Diabetes Father   . Diabetes Maternal Grandmother   . Hypertension Maternal Grandfather   . Hypertension Paternal Grandfather   . Cancer Neg Hx   . Stroke Neg Hx     Social History Social History   Tobacco Use  . Smoking status: Current Every Day Smoker    Packs/day: 1.00    Years: 26.00    Pack years: 26.00    Types: Cigarettes  . Smokeless tobacco: Never Used  Substance Use Topics  . Alcohol use: Yes    Alcohol/week: 24.0 standard drinks    Types: 24 Cans of beer per week    Comment: "3-5 times/wk I'll have 3-5 beers; max of 24 beers/wk"  . Drug use: No     Allergies   Gadolinium derivatives   Review of Systems Review of Systems Review of systems negative unless otherwise stated int he HPI.  Physical Exam Updated Vital Signs BP 106/76   Pulse 78   Temp 98.4 F (36.9 C) (Oral)   Resp 19   Ht 5' 4"  (1.626 m)   Wt 64.9 kg   SpO2 94%   BMI 24.55 kg/m   Physical Exam  Constitutional: He appears well-developed and well-nourished.  Non-toxic appearance. He does not appear ill. No distress.  HENT:  Head: Atraumatic.  Eyes: Pupils are equal, round, and reactive to light.  Neck: Normal range of motion. Neck supple. No tracheal deviation present.  Cardiovascular: Normal rate, regular rhythm, intact distal pulses and normal pulses.  Pulmonary/Chest: Effort normal and breath sounds normal. No accessory muscle usage or stridor. No respiratory  distress.  Abdominal: Soft. Bowel sounds are normal. He exhibits no distension, no ascites and no mass. There is no tenderness. There is no rebound and no guarding.  Musculoskeletal: Normal range of motion.       Right lower leg: Normal.       Left lower leg: Normal.  Neurological: He is alert.  Skin: Skin is warm and dry. He is not diaphoretic.  Psychiatric: He has a normal mood and affect.  Nursing note and vitals reviewed.   ED Treatments / Results  Labs (all labs ordered are listed, but only abnormal results are displayed) Labs Reviewed  BASIC METABOLIC  PANEL - Abnormal; Notable for the following components:      Result Value   CO2 21 (*)    All other components within normal limits  CBC - Abnormal; Notable for the following components:   MCV 102.8 (*)    MCH 34.6 (*)    All other components within normal limits  I-STAT TROPONIN, ED  I-STAT TROPONIN, ED    EKG EKG Interpretation  Date/Time:  Thursday November 10 2017 12:26:24 EDT Ventricular Rate:  83 PR Interval:    QRS Duration: 84 QT Interval:  375 QTC Calculation: 441 R Axis:   -47 Text Interpretation:  Sinus rhythm LAD, consider left anterior fascicular block ST elev, probable normal early repol pattern Confirmed by Gerlene Fee [phone]) on 11/10/2017 1:56:46 PM   Radiology Dg Chest 2 View  Result Date: 11/10/2017 CLINICAL DATA:  L sided CP with radiation of numbness into L arm. lightheadedness Hx MI with stent placement. 2 YRS AGO,HTN EXAM: CHEST - 2 VIEW COMPARISON:  Chest x-ray dated 08/26/2016. FINDINGS: Heart size and mediastinal contours are within normal limits. Streaky opacity at the RIGHT lung base, possibly atelectasis or superimposition of normal pulmonary vessels, more likely artifactual related to overlying soft tissues given the lack of correlate on the lateral view. Lungs otherwise clear. No pleural effusion or pneumothorax seen. Osseous structures about the chest are unremarkable. IMPRESSION: No  convincing evidence of acute cardiopulmonary abnormality, as detailed above. Probable soft tissue artifact or superimposition of normal pulmonary vessels at the RIGHT lung base, less likely atelectasis, much less likely pneumonia in the absence of fever. Electronically Signed   By: Franki Cabot M.D.   On: 11/10/2017 13:24    Procedures Procedures (including critical care time)  Medications Ordered in ED Medications - No data to display   Initial Impression / Assessment and Plan / ED Course  I have reviewed the triage vital signs and the nursing notes as well as the past medical history.  Pertinent labs & imaging results that were available during my care of the patient were reviewed by me and considered in my medical decision making (see chart for details).  For evaluation of chest pain with radiation to . History of anterior MI with stent placement in 2016.  Severe CAD. Nitro and aspirin resolved symptoms in EMS. Asymptomatic on arrival. Will obtain labs, EKG and chest xray and re-evaluate.  Initial Troponin, EKG negative. Will obtain second troponin and reevaluate. Concern for cardiac pathology given history. Will consult with hospitalist if neg second troponin for cardiac r/o. Second Troponin negative.   Consulted with Dr. Karmen Bongo, Hospitalist will admit. Recommended Cardiology consult. Will consult Cardiology.  Discussed with cardiology patient they feel hospitalist should evaluate patient and if need be consult them at that time.  Patient was seen and evaluated by my attending Dr. Sedonia Small who agrees with the treatment and disposition. Final Clinical Impressions(s) / ED Diagnoses   Final diagnoses:  Other chest pain    ED Discharge Orders    None       Evadna Donaghy A, PA-C 11/10/17 1719    Maudie Flakes, MD 11/10/17 2306

## 2017-11-11 ENCOUNTER — Encounter (HOSPITAL_COMMUNITY)
Admission: EM | Disposition: A | Discharge status: Home / Self Care | Payer: Self-pay | Source: Home / Self Care | Attending: Emergency Medicine

## 2017-11-11 DIAGNOSIS — I200 Unstable angina: Secondary | ICD-10-CM | POA: Diagnosis not present

## 2017-11-11 LAB — HIV ANTIBODY (ROUTINE TESTING W REFLEX): HIV Screen 4th Generation wRfx: NONREACTIVE

## 2017-11-11 LAB — TROPONIN I: Troponin I: <0.03 ng/mL (ref <0.03)

## 2017-11-11 SURGERY — LEFT HEART CATH AND CORONARY ANGIOGRAPHY
Anesthesia: LOCAL

## 2017-11-11 MED ORDER — SODIUM CHLORIDE 0.9 % WEIGHT BASED INFUSION: 1.0000 mL/kg/h | INTRAVENOUS | Status: DC

## 2017-11-11 MED ORDER — SODIUM CHLORIDE 0.9 % WEIGHT BASED INFUSION: 3.0000 mL/kg/h | INTRAVENOUS | Status: AC

## 2017-11-11 MED ORDER — ASPIRIN 81 MG PO CHEW: 81.0000 mg | CHEWABLE_TABLET | ORAL | Status: DC

## 2017-11-11 NOTE — Discharge Instructions (Signed)
Follow-up with your primary care physician within 1 week, with labs including CBC and chemistries Follow-up with your cardiologist within 1 week, for evaluation for catheterization, and stress test once you leave the hospital Please discontinue the use of tobacco and alcohol

## 2017-11-11 NOTE — Discharge Summary (Signed)
Physician Discharge Summary  [name] [name] EXB:[id number] DOB: Apr 02, 1975 DOA: 11/10/2017  PCP: Denita Lung, MD  Admit date: 11/10/2017 Discharge date: 11/11/2017  Admitted From: Home   Disposition: home  Recommendations for Outpatient Follow-up:  1. Follow up with PCP within 1 week with labs including CBC, and Chemistries 2. Follow-up with cardiology 1 week, for catheterization, and stress test as outpatient, as patient declined to proceed with further testing while here. 3. Follow HIV results    Home Health: no Equipment/Devices: none Discharge Condition: Stable   CODE STATUS: FULL    Diet recommendation:    Heart Healthy  Brief/Interim Summary:  42 year old male with a prior history of anterior infarction in March 2016, at which time he was found to have subtotal LAD, eventually occluding, status post stent.  He presented on 11/10/2017 with left-sided chest pain, with left arm tingling and some numbness.  He took nitroglycerin and aspirin, with some relief.  He denies any exertional discomfort.  Troponins and EKG were unremarkable.  Chest x-ray was negative.  Dr. Wynonia Lawman, cardiology has seen and evaluated the patient, recommending to proceed with cardiac catheterization, as well as stress test.  However, the patient wishes to be discharged to home, and is willing to follow-up as an outpatient with his cardiologist.  Despite efforts to convince the patient to remain in hospital to proceed with these tests, he wants to go home, understanding the risks of doing so.  He has been instructed to present to the emergency department in the interim, new chest pain arises.  Discharge Diagnoses:  Principal Problem:   Chest pain Active Problems:   High risk sexual behavior   Tobacco use disorder   CAD (coronary artery disease), native coronary artery   Hyperlipidemia   Hypertension   Alcohol drinking problem  Chest pain syndrome.  Patient has a history of CAD, status post DES, presenting with  exertional chest pain.  Chest x-ray unremarkable.  Cardiac troponins negative.  EKG not indicative of acute ischemia.  He was given aspirin and morphine at the ER.  He remained chest pain-free during the hospitalization.  Cardiology consult patient was obtained, and the patient was undergo cardiac catheterization and that or stress test while in the hospital, but he wishes to be discharged, and is to follow-up as outpatient within this week. Continue aspirin daily   Hypertension BP 132/81   Pulse 75   Continue home anti-hypertensive medications with lisinopril and Lopressor  Hyperlipidemia, last lipid was checked in 10/25/2017, with total cholesterol 120, HDL 73, LDL 29, triglycerides 89 Continue Lipitor  Tobacco dependence Alcohol abuse Encouraged cessation. Nicotine patch was ordered at patient request, but he declines a prescription for home. Encouraged seeking help for alcohol abuse, i.e. Alcoholics Anonymous  High risk sexual behavior.  The patient has a history of neurosyphilis, and has had multiple male partners.   HIV is pending Continue Truvada  Anxiety Continue home Xanax    Discharge Instructions   Allergies as of 11/11/2017      Reactions   Gadolinium Derivatives Nausea Only   MRI dye      Medication List    TAKE these medications   ALPRAZolam 0.5 MG tablet Commonly known as:  XANAX TAKE ONE TABLET BY MOUTH TWICE A DAY AS NEEDED FOR ANXIETY What changed:  See the new instructions.   aspirin 81 MG EC tablet Take 1 tablet (81 mg total) by mouth daily.   atorvastatin 40 MG tablet Commonly known as:  LIPITOR TAKE ONE  TABLET BY MOUTH DAILY AT 6 PM. What changed:    how much to take  how to take this  when to take this  additional instructions   emtricitabine-tenofovir 200-300 MG tablet Commonly known as:  TRUVADA Take 1 tablet by mouth daily.   lisinopril 5 MG tablet Commonly known as:  PRINIVIL,ZESTRIL Take 1 tablet (5 mg total) by mouth daily.    metoprolol tartrate 25 MG tablet Commonly known as:  LOPRESSOR Take 1 tablet (25 mg total) by mouth 2 (two) times daily.   multivitamin with minerals Tabs tablet Take 1 tablet by mouth daily.       Allergies  Allergen Reactions  . Gadolinium Derivatives Nausea Only    MRI dye    Consultations: Cardiology Dr. Wynonia Lawman, [place].  The patient was to be arranged to have a cardiac catheterization and stress test, but declined, and wants to be discharged to home, and follow this up as an outpatient with his own cardiologist who is out of town at this time.   Procedures/Studies: Dg Chest 2 View  Result Date: 11/10/2017 CLINICAL DATA:  L sided CP with radiation of numbness into L arm. lightheadedness Hx MI with stent placement. 2 YRS AGO,HTN EXAM: CHEST - 2 VIEW COMPARISON:  Chest x-ray dated 08/26/2016. FINDINGS: Heart size and mediastinal contours are within normal limits. Streaky opacity at the RIGHT lung base, possibly atelectasis or superimposition of normal pulmonary vessels, more likely artifactual related to overlying soft tissues given the lack of correlate on the lateral view. Lungs otherwise clear. No pleural effusion or pneumothorax seen. Osseous structures about the chest are unremarkable. IMPRESSION: No convincing evidence of acute cardiopulmonary abnormality, as detailed above. Probable soft tissue artifact or superimposition of normal pulmonary vessels at the RIGHT lung base, less likely atelectasis, much less likely pneumonia in the absence of fever. Electronically Signed   By: Franki Cabot M.D.   On: 11/10/2017 13:24      Subjective: Marland Kitchen Reports feeling better.  Denies any chest pain, or palpitations.  Denies any chest wall pain.  He denies any nausea or vomiting or diaphoresis. He denies any lower extremity swelling, or calf pain.  He wants to be discharged to home.  Discharge Exam: Vitals:   11/10/17 1740 11/11/17 0500  BP: 130/84 132/81  Pulse: 68 75  Resp: 12 16  Temp:   98.2 F (36.8 C)  SpO2:  98%   Vitals:   11/10/17 1600 11/10/17 1700 11/10/17 1740 11/11/17 0500  BP: 106/76  130/84 132/81  Pulse: 69 78 68 75  Resp: 18 19 12 16   Temp:    98.2 F (36.8 C)  TempSrc:    Oral  SpO2: 95% 94%  98%  Weight:      Height:        General: Pt is alert, awake, severely anxious to go home Cardiovascular: RRR, S1/S2 +, no rubs, no gallops Respiratory: CTA bilaterally, no wheezing, no rhonchi Abdominal: Soft, NT, ND, bowel sounds + Extremities: no edema, no cyanosis    The results of significant diagnostics from this hospitalization (including imaging, microbiology, ancillary and laboratory) are listed below for reference.     Microbiology: No results found for this or any previous visit (from the past 240 hour(s)).   Labs: BNP (last 3 results) No results for input(s): BNP in the last 8760 hours. Basic Metabolic Panel: Recent Labs  Lab 11/10/17 1229  NA 140  K 3.8  CL 106  CO2 21*  GLUCOSE 95  BUN 10  CREATININE 0.69  CALCIUM 8.9   Liver Function Tests: No results for input(s): AST, ALT, ALKPHOS, BILITOT, PROT, ALBUMIN in the last 168 hours. No results for input(s): LIPASE, AMYLASE in the last 168 hours. No results for input(s): AMMONIA in the last 168 hours. CBC: Recent Labs  Lab 11/10/17 1229  WBC 6.6  HGB 14.8  HCT 44.0  MCV 102.8*  PLT 167   Cardiac Enzymes: Recent Labs  Lab 11/10/17 1823 11/10/17 2318 11/11/17 0555  TROPONINI <0.03 <0.03 <0.03   BNP: Invalid input(s): POCBNP CBG: No results for input(s): GLUCAP in the last 168 hours. D-Dimer No results for input(s): DDIMER in the last 72 hours. Hgb A1c No results for input(s): HGBA1C in the last 72 hours. Lipid Profile No results for input(s): CHOL, HDL, LDLCALC, TRIG, CHOLHDL, LDLDIRECT in the last 72 hours. Thyroid function studies Recent Labs    11/10/17 1823  TSH 2.142   Anemia work up No results for input(s): VITAMINB12, FOLATE, FERRITIN, TIBC, IRON,  RETICCTPCT in the last 72 hours. Urinalysis    Component Value Date/Time   COLORURINE AMBER (A) 04/20/2014 0605   APPEARANCEUR CLEAR 04/20/2014 0605   LABSPEC 1.037 (H) 04/20/2014 0605   PHURINE 5.5 04/20/2014 0605   GLUCOSEU NEGATIVE 04/20/2014 0605   HGBUR NEGATIVE 04/20/2014 0605   BILIRUBINUR moderate (A) 10/27/2017 1654   BILIRUBINUR NEG 05/08/2014 1011   KETONESUR negative 10/27/2017 1654   KETONESUR NEGATIVE 04/20/2014 0605   PROTEINUR =100 (A) 10/27/2017 1654   PROTEINUR NEG 05/08/2014 1011   PROTEINUR NEGATIVE 04/20/2014 0605   UROBILINOGEN 0.2 05/08/2014 1011   UROBILINOGEN 1.0 04/20/2014 0605   NITRITE Negative 10/27/2017 1654   NITRITE NEG 05/08/2014 1011   NITRITE NEGATIVE 04/20/2014 0605   LEUKOCYTESUR Negative 10/27/2017 1654   Sepsis Labs Invalid input(s): PROCALCITONIN,  WBC,  LACTICIDVEN Microbiology No results found for this or any previous visit (from the past 240 hour(s)).   Time coordinating discharge: Over 30 minutes  SIGNED:   Sharene Butters, MD  [facility] 11/11/2017, 12:08 PM   If 7PM-7AM, please contact night-coverage [email or web address] Password [email or web address]

## 2017-11-11 NOTE — Progress Notes (Signed)
Pt no longer wanting heart cath ,  MD at bedside and talking to patient ;  Plan of care changed at this time , pt will no longer go to cath lab

## 2017-11-11 NOTE — Consult Note (Signed)
Cardiology Consult Note  Admit date: 11/10/2017 Name: [name] [name] 42 y.o.  male DOB:  1975-05-09 MRN:  [id number]  Today's date:  11/11/2017  Referring Physician:    [facility]  Primary Physician:    [employer]  Reason for Consultation:    Chest discomfort and tingling  IMPRESSIONS: 1.  Left chest discomfort and tingling with arm discomfort and patient with known coronary disease-possible acute coronary syndrome 2.  CAD with previous anterior infarction and stent to the LAD 3.  History of neurosyphilis 4.  Hypertension 5.  Hyperlipidemia 6.  HIV 7.  Severe anxiety and phobias  RECOMMENDATION: He became quite anxious yesterday with the onset of symptoms and was transported here by EMS.  His previous presentation was a delayed presentation of an anterior MI and he continues to smoke.  Symptoms were atypical previously and I would recommend repeat catheterization to help with long-term management in light of his severe anxiety disorder.  Cardiac catheterization procedure was discussed with the patient fully including risks of myocardial infarction, death, stroke, bleeding, arrhythmia, dye allergy, or renal insufficiency. The patient understands and is willing to proceed.  HISTORY: This 42 year old male has a previous history of an anterior infarction in March 2016.  At that point he was found to have a subtotal LAD which actually had occluded at the time of his intervention and he had a stent placed as detailed below.  He is tended to be quite anxious and unfortunately has continued to smoke.  He does not get much in the way of regular exercise.  He has had a recent visit to the doctor's office with significant phobias regarding fear of heights.  He was at work yesterday and had the onset of left sided chest numbness with tingling in his left arm as well as some numbness.  He described initially a dull pain in his left chest got weak and could not drive himself to the hospital and called  EMS.  He took nitroglycerin and aspirin with relief of the discomfort without recurrence since then.  He does not have exertional discomfort.  Troponins and EKG have been unremarkable.  Past Medical History:  Diagnosis Date  . Anxiety   . CAD (coronary artery disease), native coronary artery 05/24/2014   Anterior infarction occurring out of Hospital approximately week prior to admission 3/17 16  Cardiac catheterization 3/18 showing a proximal 40% segmental LAD, 99% mid LAD with collaterals from the right coronary artery, normal circumflex, 20-30% mid and distal right coronary artery stenosis.  3.0 x 28 mm Synergy stent by Dr. Irish Lack post dilated to 3.25 mm (incidental note that LAD was closed at the time of the PCI a few minutes after the catheterization but with no symptoms)  Echo shows distal anterior apical and distal inferior severe hypokinesis with EF around 50%   . Coronary artery disease    a. 05/2014 OOH MI/PCI: LM nl, LAD 40p, 99p (3.0x28 Synergy DES) w/ R->L Collaterals, LCX nl, RCA 30p/d, EF 55% w/ sev distal inf/ant HK.  . Depression   . Erectile dysfunction   . H/O echocardiogram    a. 05/2014 Echo: Ef 45-50%, sev mid antsept/ant HK.  Marland Kitchen Hyperlipidemia   . Hypertension    medication in 2012, stress as a big component  . MI (myocardial infarction) ([place])   . 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    . Tobacco abuse       Past Surgical History:  Procedure Laterality Date  . CORONARY  STENT PLACEMENT  05/24/2014   MID LAD DES   . LEFT HEART CATHETERIZATION WITH CORONARY ANGIOGRAM N/A 05/24/2014   Procedure: LEFT HEART CATHETERIZATION WITH CORONARY ANGIOGRAM;  Surgeon: Jettie Booze, MD;  Location: [facility] CATH LAB;  Service: Cardiovascular;  Laterality: N/A;  . PERCUTANEOUS CORONARY STENT INTERVENTION (PCI-S)  05/24/2014   Procedure: PERCUTANEOUS CORONARY STENT INTERVENTION (PCI-S);  Surgeon: Jettie Booze, MD;  Location: [facility] CATH LAB;  Service: Cardiovascular;;   . PILONIDAL CYST EXCISION  1995    Allergies:  is allergic to gadolinium derivatives.   Medications: Prior to Admission medications   Medication Sig Start Date End Date Taking? Authorizing Provider  ALPRAZolam (XANAX) 0.5 MG tablet TAKE ONE TABLET BY MOUTH TWICE A DAY AS NEEDED FOR ANXIETY Patient taking differently: Take 0.5 mg by mouth 2 (two) times daily as needed for anxiety.  09/21/17  Yes Denita Lung, MD  aspirin EC 81 MG EC tablet Take 1 tablet (81 mg total) by mouth daily. 05/25/14  Yes Theora Gianotti, NP  atorvastatin (LIPITOR) 40 MG tablet TAKE ONE TABLET BY MOUTH DAILY AT 6 PM. Patient taking differently: Take 40 mg by mouth every morning.  10/27/17  Yes Denita Lung, MD  emtricitabine-tenofovir (TRUVADA) 200-300 MG tablet Take 1 tablet by mouth daily. 11/11/16  Yes Denita Lung, MD  lisinopril (PRINIVIL,ZESTRIL) 5 MG tablet Take 1 tablet (5 mg total) by mouth daily. 10/27/17  Yes Denita Lung, MD  metoprolol tartrate (LOPRESSOR) 25 MG tablet Take 1 tablet (25 mg total) by mouth 2 (two) times daily. 10/27/17  Yes Denita Lung, MD  Multiple Vitamin (MULTIVITAMIN WITH MINERALS) TABS tablet Take 1 tablet by mouth daily.   Yes [provider]    Family History: Family Status  Relation Name Status  . Mother  Alive       CAD, DM  . Father  Alive       Thyroid disease  . Sister  Alive  . Brother  Alive  . Sister  Alive  . Brother  Alive  . MGM  (Not Specified)  . MGF  (Not Specified)  . PGF  (Not Specified)  . Neg Hx  (Not Specified)    Social History:   reports that he has been smoking cigarettes. He has a 26.00 pack-year smoking history. He has never used smokeless tobacco. He reports that he drinks about 24.0 standard drinks of alcohol per week. He reports that he does not use drugs.   Social History   Social History Narrative   Lives at home with roommate, exercise at gym in apartment complex, diet - no discretion.        Review of  Systems: Tends to be a highly anxious person.  He has also had some issues recently with depression and has been given a referral to a counselor.  Other than as noted above the remainder of the review of systems is unremarkable.  Physical Exam: BP 132/81   Pulse 75   Temp 98.2 F (36.8 C) (Oral)   Resp 16   Ht 5' 4"  (1.626 m)   Wt 64.9 kg   SpO2 98%   BMI 24.55 kg/m   General appearance: Anxious appearing male in no acute distress Head: Normocephalic, without obvious abnormality, atraumatic Eyes: conjunctivae/corneas clear. PERRL, EOM's intact. Fundi not examined. Neck: no adenopathy, no carotid bruit, no JVD and supple, symmetrical, trachea midline Lungs: clear to auscultation bilaterally Heart: regular rate and rhythm, S1, S2 normal,  no murmur, click, rub or gallop Abdomen: soft, non-tender; bowel sounds normal; no masses,  no organomegaly Rectal: deferred Extremities: extremities normal, atraumatic, no cyanosis or edema Pulses: 2+ and symmetric Skin: Skin color, texture, turgor normal. No rashes or lesions Neurologic: Grossly normal Psych: Alert and oriented x 3 Labs: CBC Recent Labs    11/10/17 1229  WBC 6.6  RBC 4.28  HGB 14.8  HCT 44.0  PLT 167  MCV 102.8*  MCH 34.6*  MCHC 33.6  RDW 13.1   CMP  Recent Labs    11/10/17 1229  NA 140  K 3.8  CL 106  CO2 21*  GLUCOSE 95  BUN 10  CREATININE 0.69  CALCIUM 8.9  GFRNONAA >60  GFRAA >60   Cardiac Panel (last 3 results) Troponin (Point of Care Test) Recent Labs    11/10/17 1547  TROPIPOC 0.02   Cardiac Panel (last 3 results) Recent Labs    11/10/17 1823 11/10/17 2318 11/11/17 0555  TROPONINI <0.03 <0.03 <0.03     Radiology:  Possible atelectasis versus artifact, otherwise no significant changes  EKG: Poor R wave progression possible previous anterior infarction, sinus rhythm Independently reviewed by me  Signed:  W. Doristine Church MD [facility]   Cardiology Consultant  11/11/2017, 8:51  AM

## 2017-11-11 NOTE — Progress Notes (Signed)
Dr. Wynonia Lawman aware of pt refusing stress test , diet order given

## 2017-11-24 ENCOUNTER — Encounter: Payer: Self-pay | Admitting: Family Medicine

## 2017-11-24 ENCOUNTER — Ambulatory Visit (INDEPENDENT_AMBULATORY_CARE_PROVIDER_SITE_OTHER): Payer: Managed Care, Other (non HMO) | Admitting: Family Medicine

## 2017-11-24 VITALS — BP 128/86 | HR 80 | Temp 98.5°F | Wt 146.2 lb

## 2017-11-24 DIAGNOSIS — I2510 Atherosclerotic heart disease of native coronary artery without angina pectoris: Secondary | ICD-10-CM | POA: Diagnosis not present

## 2017-11-24 NOTE — Progress Notes (Signed)
   Subjective:    Patient ID: [name] [name], male    DOB: [birth year], 42 y.o.   MRN: [id number]  HPI He is here for a follow-up visit after recent hospitalization.  He was admitted on September 5 and sent home on 10/6.  His troponin levels were negative.  It was recommended that he have a catheterization however he declined and plans to follow-up with cardiology.  Since leaving the hospital he has had no difficulty with chest pain, shortness of breath, diaphoresis or weakness.   Review of Systems     Objective:   Physical Exam Alert and in no distress.  Cardiac exam shows a regular rhythm without murmurs or gallops.  Lungs are clear to auscultation. The hospital record including emergency room, lab and discharge summary was reviewed.      Assessment & Plan:  Coronary artery disease involving native coronary artery of native heart, angina presence unspecified Encouraged him to follow-up with cardiology which he plans on doing.

## 2017-12-21 ENCOUNTER — Other Ambulatory Visit: Payer: Self-pay | Admitting: Medical

## 2017-12-21 DIAGNOSIS — F418 Other specified anxiety disorders: Secondary | ICD-10-CM

## 2017-12-21 NOTE — Telephone Encounter (Signed)
Is this ok to refill?  

## 2018-10-07 DIAGNOSIS — K859 Acute pancreatitis without necrosis or infection, unspecified: Secondary | ICD-10-CM

## 2018-10-07 HISTORY — DX: Acute pancreatitis without necrosis or infection, unspecified: K85.90

## 2018-10-18 ENCOUNTER — Ambulatory Visit (INDEPENDENT_AMBULATORY_CARE_PROVIDER_SITE_OTHER): Payer: Self-pay

## 2018-10-18 ENCOUNTER — Ambulatory Visit (HOSPITAL_COMMUNITY)
Admission: EM | Admit: 2018-10-18 | Discharge: 2018-10-18 | Disposition: A | Discharge status: Home / Self Care | Payer: Self-pay | Attending: Family Medicine | Admitting: Family Medicine

## 2018-10-18 ENCOUNTER — Encounter (HOSPITAL_COMMUNITY): Payer: Self-pay

## 2018-10-18 ENCOUNTER — Other Ambulatory Visit: Payer: Self-pay

## 2018-10-18 DIAGNOSIS — I2510 Atherosclerotic heart disease of native coronary artery without angina pectoris: Secondary | ICD-10-CM

## 2018-10-18 DIAGNOSIS — R112 Nausea with vomiting, unspecified: Secondary | ICD-10-CM

## 2018-10-18 DIAGNOSIS — I10 Essential (primary) hypertension: Secondary | ICD-10-CM

## 2018-10-18 DIAGNOSIS — R1084 Generalized abdominal pain: Secondary | ICD-10-CM

## 2018-10-18 MED ORDER — LISINOPRIL 20 MG PO TABS: 20.0000 mg | ORAL_TABLET | Freq: Every day | ORAL | 1 refills | Status: DC

## 2018-10-18 MED ORDER — ONDANSETRON 4 MG PO TBDP: 4.0000 mg | ORAL_TABLET | Freq: Three times a day (TID) | ORAL | 0 refills | Status: DC | PRN

## 2018-10-18 NOTE — Discharge Instructions (Addendum)
You may try using over the counter magnesium citrate to stimulate your bowels and produce a bowel movement.  You have been seen today for abdominal pain. Your evaluation was not suggestive of any emergent condition requiring medical intervention at this time. However, some abdominal problems make take more time to appear. Therefore, it is very important for you to pay attention to any new symptoms or worsening of your current condition.  Please return here or to the Emergency Department immediately should you begin to feel worse in any way or have any of the following symptoms: increasing or different abdominal pain, persistent vomiting, inability to drink fluids, fevers, or shaking chills.

## 2018-10-18 NOTE — ED Triage Notes (Signed)
Pt presents with complaints of constipation x 3 days. Complaints of abdominal pain and bloating x 2 days. Denies any relief with otc laxatives. Patient reports emesis x 5 times today. Abdomen is mildly tender to touch.

## 2018-10-18 NOTE — ED Provider Notes (Addendum)
[name] [name]   [id number] 10/18/18 Arrival Time: 3329  ASSESSMENT & PLAN:  1. Generalized abdominal pain   2. Uncontrolled hypertension   3. Non-intractable vomiting with nausea, unspecified vomiting type     I have personally viewed the imaging studies ordered this visit. No obvious signs of SBO.  Follow-up Information    [facility]   Specialty: Emergency Medicine Why: If symptoms worsen in any way. Contact information: [address] [id number] [place] [phone]          Meds ordered this encounter  Medications   lisinopril (ZESTRIL) 20 MG tablet    Sig: Take 1 tablet (20 mg total) by mouth daily.    Dispense:  30 tablet    Refill:  1   ondansetron (ZOFRAN-ODT) 4 MG disintegrating tablet    Sig: Take 1 tablet (4 mg total) by mouth every 8 (eight) hours as needed for nausea or vomiting.    Dispense:  15 tablet    Refill:  0   Is tolerating PO fluids. No indication for IVF at this time. Increasing Lisinopril dose today. He plans to schedule f/u with PCP. Encouraged to cut down on alcohol use.   Discharge Instructions     You may try using over the counter magnesium citrate to stimulate your bowels and produce a bowel movement.  You have been seen today for abdominal pain. Your evaluation was not suggestive of any emergent condition requiring medical intervention at this time. However, some abdominal problems make take more time to appear. Therefore, it is very important for you to pay attention to any new symptoms or worsening of your current condition.  Please return here or to the Emergency Department immediately should you begin to feel worse in any way or have any of the following symptoms: increasing or different abdominal pain, persistent vomiting, inability to drink fluids, fevers, or shaking chills.     Reviewed expectations re: course of current medical issues.  Questions answered. Outlined signs and symptoms indicating need for more acute intervention. Patient verbalized understanding. After Visit Summary given.   SUBJECTIVE: History from: patient. [name] [name] is a 43 y.o. male who presents with complaint of intermittent generalized abdominal discomfort. Onset gradual, about 3 d ago. Discomfort described as dull and "full feeling"; without radiation; does not wake him at night. Symptoms are stable since beginning. Fever: absent. Aggravating factors: have not been identified. Alleviating factors: have not been identified. Associated symptoms: occasional emesis; none today. He denies arthralgias, chills, diarrhea, dysuria and myalgias. Appetite: normal. PO intake: decreased. Ambulatory without assistance. Urinary symptoms: none. Bowel movements: are less frequent than before; last bowel movement about 3-4 d ago; without blood. History of similar: no. OTC treatment: OTC laxative ("I think") without change in symptoms.  Social History   Substance and Sexual Activity  Alcohol Use Yes   Alcohol/week: 24.0 standard drinks   Types: 24 Cans of beer per week   Comment: 1/5-3/4 [place], 4-5 times a week.  +Guilty, cutting down; 2/4 CAGE questions.    Past Surgical History:  Procedure Laterality Date   CORONARY STENT PLACEMENT  05/24/2014   MID LAD DES    LEFT HEART CATHETERIZATION WITH CORONARY ANGIOGRAM N/A 05/24/2014   Procedure: LEFT HEART CATHETERIZATION WITH CORONARY ANGIOGRAM;  Surgeon: Jettie Booze, MD;  Location: [facility] CATH LAB;  Service: Cardiovascular;  Laterality: N/A;   PERCUTANEOUS CORONARY STENT INTERVENTION (PCI-S)  05/24/2014   Procedure: PERCUTANEOUS CORONARY STENT INTERVENTION (  PCI-S);  Surgeon: Jettie Booze, MD;  Location: [facility] C[facility] LAB;  Service: Cardiovascular;;   PILONIDAL CYST EXCISION  1995   Increased blood pressure noted today. Reports that he is treated for hypertension. Reports he is taking medications as  directed.  He reports no chest pain on exertion, no dyspnea on exertion, no swelling of ankles, no orthostatic dizziness or lightheadedness, no orthopnea or paroxysmal nocturnal dyspnea, no palpitations and no intermittent claudication symptoms.  ROS: As per HPI. All other systems negative.  OBJECTIVE:  Vitals:   10/18/18 1426 10/18/18 1428  BP:  (!) 211/110  Pulse: 78   Resp: 18   Temp: 98.2 F (36.8 C)   SpO2: 100%     Recheck BP: 204/102 R arm  General appearance: alert, oriented, no acute distress  Oropharynx: moist Lungs: clear to auscultation bilaterally; unlabored respirations Heart: regular rate and rhythm Abdomen: soft; without distention; mild epigastric tenderness; normal bowel sounds; without masses or organomegaly; without guarding or rebound tenderness Back: without CVA tenderness; FROM at waist Extremities: without LE edema; symmetrical; without gross deformities Skin: warm and dry Neurologic: normal gait Psychological: alert and cooperative; normal mood and affect  Imaging: Dg Abd Acute W/chest  Result Date: 10/18/2018 CLINICAL DATA:  Abdominal pain, vomiting and distention EXAM: DG ABDOMEN ACUTE W/ 1V CHEST COMPARISON:  11/10/2017 chest radiograph. FINDINGS: Stable cardiomediastinal silhouette with normal heart size. No pneumothorax. No pleural effusion. Lungs appear clear, with no acute consolidative airspace disease and no pulmonary edema. Fluid level in the stomach. No disproportionately dilated small bowel loops. Mild stool and moderate gas in the large bowel. No evidence of pneumatosis or pneumoperitoneum. No radiopaque nephrolithiasis. IMPRESSION: 1. No active cardiopulmonary disease. 2. Nonobstructive bowel gas pattern. Nonspecific fluid level in the stomach. Moderate gas and mild stool in the colon. Electronically Signed   By: Ilona Sorrel M.D.   On: 10/18/2018 15:39     Allergies  Allergen Reactions   Gadolinium Derivatives Nausea Only    MRI dye                                                Past Medical History:  Diagnosis Date   Anxiety    CAD (coronary artery disease), native coronary artery 05/24/2014   Anterior infarction occurring out of Hospital approximately week prior to admission 3/17 16  Cardiac catheterization 3/18 showing a proximal 40% segmental LAD, 99% mid LAD with collaterals from the right coronary artery, normal circumflex, 20-30% mid and distal right coronary artery stenosis.  3.0 x 28 mm Synergy stent by Dr. Irish Lack post dilated to 3.25 mm (incidental note that LAD was closed at the time of the PCI a few minutes after the catheterization but with no symptoms)  Echo shows distal anterior apical and distal inferior severe hypokinesis with EF around 50%    Coronary artery disease    a. 05/2014 OOH MI/PCI: LM nl, LAD 40p, 99p (3.0x28 Synergy DES) w/ R->L Collaterals, LCX nl, RCA 30p/d, EF 55% w/ sev distal inf/ant HK.   Depression    Erectile dysfunction    H/O echocardiogram    a. 05/2014 Echo: Ef 45-50%, sev mid antsept/ant HK.   Hyperlipidemia    Hypertension    medication in 2012, stress as a big component   MI (myocardial infarction) ([place])    Neurosyphilis in male  hospitalization 04/19/2014.     Tobacco abuse    Social History   Socioeconomic History   Marital status: Single    Spouse name: Not on file   Number of children: Not on file   Years of education: Not on file   Highest education level: Not on file  Occupational History   Occupation: Librarian, academic  Social Needs   Financial resource strain: Not on file   Food insecurity    Worry: Not on file    Inability: Not on file   Transportation needs    Medical: Not on file    Non-medical: Not on file  Tobacco Use   Smoking status: Current Every Day Smoker    Packs/day: 1.00    Years: 26.00    Pack years: 26.00    Types: Cigarettes   Smokeless tobacco: Never Used  Substance and Sexual Activity   Alcohol use: Yes     Alcohol/week: 24.0 standard drinks    Types: 24 Cans of beer per week    Comment: "3-5 times/wk I'll have 3-5 beers; max of 24 beers/wk"   Drug use: No   Sexual activity: Yes    Partners: Male  Lifestyle   Physical activity    Days per week: Not on file    Minutes per session: Not on file   Stress: Not on file  Relationships   Social connections    Talks on phone: Not on file    Gets together: Not on file    Attends religious service: Not on file    Active member of club or organization: Not on file    Attends meetings of clubs or organizations: Not on file    Relationship status: Not on file   Intimate partner violence    Fear of current or ex partner: Not on file    Emotionally abused: Not on file    Physically abused: Not on file    Forced sexual activity: Not on file  Other Topics Concern   Not on file  Social History Narrative   Lives at home with roommate, exercise at gym in apartment complex, diet - no discretion.       Family History  Problem Relation Age of Onset   Thyroid disease Mother    Heart disease Father 33       MI, stents   Peripheral Artery Disease Father    Diabetes type II Father    Diabetes Father    Diabetes Maternal Grandmother    Hypertension Maternal Grandfather    Hypertension Paternal Grandfather    Cancer Neg Hx    Stroke Neg Hx      Vanessa Kick, MD 10/21/18 0601    Vanessa Kick, MD 10/21/18 0930

## 2018-10-20 ENCOUNTER — Encounter (HOSPITAL_COMMUNITY): Payer: Self-pay | Admitting: Emergency Medicine

## 2018-10-20 ENCOUNTER — Inpatient Hospital Stay (HOSPITAL_COMMUNITY)
Admission: EM | Admit: 2018-10-20 | Discharge: 2018-10-22 | DRG: 439 | Disposition: A | Discharge status: Home / Self Care | Payer: Self-pay | Attending: Internal Medicine | Admitting: Internal Medicine

## 2018-10-20 ENCOUNTER — Other Ambulatory Visit: Payer: Self-pay

## 2018-10-20 ENCOUNTER — Emergency Department (HOSPITAL_COMMUNITY): Payer: Self-pay

## 2018-10-20 DIAGNOSIS — Z8349 Family history of other endocrine, nutritional and metabolic diseases: Secondary | ICD-10-CM

## 2018-10-20 DIAGNOSIS — F418 Other specified anxiety disorders: Secondary | ICD-10-CM | POA: Diagnosis present

## 2018-10-20 DIAGNOSIS — Z7251 High risk heterosexual behavior: Secondary | ICD-10-CM

## 2018-10-20 DIAGNOSIS — Z79899 Other long term (current) drug therapy: Secondary | ICD-10-CM

## 2018-10-20 DIAGNOSIS — F10230 Alcohol dependence with withdrawal, uncomplicated: Secondary | ICD-10-CM | POA: Diagnosis present

## 2018-10-20 DIAGNOSIS — Z7252 High risk homosexual behavior: Secondary | ICD-10-CM

## 2018-10-20 DIAGNOSIS — K2990 Gastroduodenitis, unspecified, without bleeding: Secondary | ICD-10-CM

## 2018-10-20 DIAGNOSIS — Z8249 Family history of ischemic heart disease and other diseases of the circulatory system: Secondary | ICD-10-CM

## 2018-10-20 DIAGNOSIS — Z20828 Contact with and (suspected) exposure to other viral communicable diseases: Secondary | ICD-10-CM | POA: Diagnosis present

## 2018-10-20 DIAGNOSIS — Z8619 Personal history of other infectious and parasitic diseases: Secondary | ICD-10-CM

## 2018-10-20 DIAGNOSIS — Z91041 Radiographic dye allergy status: Secondary | ICD-10-CM

## 2018-10-20 DIAGNOSIS — I10 Essential (primary) hypertension: Secondary | ICD-10-CM | POA: Diagnosis present

## 2018-10-20 DIAGNOSIS — Z833 Family history of diabetes mellitus: Secondary | ICD-10-CM

## 2018-10-20 DIAGNOSIS — K2970 Gastritis, unspecified, without bleeding: Secondary | ICD-10-CM | POA: Diagnosis present

## 2018-10-20 DIAGNOSIS — Z7982 Long term (current) use of aspirin: Secondary | ICD-10-CM

## 2018-10-20 DIAGNOSIS — D6959 Other secondary thrombocytopenia: Secondary | ICD-10-CM | POA: Diagnosis present

## 2018-10-20 DIAGNOSIS — K209 Esophagitis, unspecified: Secondary | ICD-10-CM | POA: Diagnosis present

## 2018-10-20 DIAGNOSIS — Z955 Presence of coronary angioplasty implant and graft: Secondary | ICD-10-CM

## 2018-10-20 DIAGNOSIS — F17200 Nicotine dependence, unspecified, uncomplicated: Secondary | ICD-10-CM | POA: Diagnosis present

## 2018-10-20 DIAGNOSIS — K5900 Constipation, unspecified: Secondary | ICD-10-CM | POA: Diagnosis present

## 2018-10-20 DIAGNOSIS — E876 Hypokalemia: Secondary | ICD-10-CM | POA: Diagnosis present

## 2018-10-20 DIAGNOSIS — K852 Alcohol induced acute pancreatitis without necrosis or infection: Principal | ICD-10-CM | POA: Diagnosis present

## 2018-10-20 DIAGNOSIS — I2510 Atherosclerotic heart disease of native coronary artery without angina pectoris: Secondary | ICD-10-CM | POA: Diagnosis present

## 2018-10-20 DIAGNOSIS — E785 Hyperlipidemia, unspecified: Secondary | ICD-10-CM | POA: Diagnosis present

## 2018-10-20 DIAGNOSIS — Z8679 Personal history of other diseases of the circulatory system: Secondary | ICD-10-CM

## 2018-10-20 DIAGNOSIS — I252 Old myocardial infarction: Secondary | ICD-10-CM

## 2018-10-20 DIAGNOSIS — K859 Acute pancreatitis without necrosis or infection, unspecified: Secondary | ICD-10-CM | POA: Diagnosis present

## 2018-10-20 LAB — COMPREHENSIVE METABOLIC PANEL
ALT: 20 U/L (ref 0–44)
AST: 35 U/L (ref 15–41)
Albumin: 3.9 g/dL (ref 3.5–5.0)
Alkaline Phosphatase: 94 U/L (ref 38–126)
Anion gap: 17 — ABNORMAL HIGH (ref 5–15)
BUN: 5 mg/dL — ABNORMAL LOW (ref 6–20)
CO2: 26 mmol/L (ref 22–32)
Calcium: 9.2 mg/dL (ref 8.9–10.3)
Chloride: 88 mmol/L — ABNORMAL LOW (ref 98–111)
Creatinine, Ser: 0.73 mg/dL (ref 0.61–1.24)
GFR calc Af Amer: >60 mL/min (ref >60)
GFR calc non Af Amer: >60 mL/min (ref >60)
Glucose, Bld: 126 mg/dL — ABNORMAL HIGH (ref 70–99)
Potassium: 3.2 mmol/L — ABNORMAL LOW (ref 3.5–5.1)
Sodium: 131 mmol/L — ABNORMAL LOW (ref 135–145)
Total Bilirubin: 2 mg/dL — ABNORMAL HIGH (ref 0.3–1.2)
Total Protein: 7.6 g/dL (ref 6.5–8.1)

## 2018-10-20 LAB — CBC
HCT: 48.3 % (ref 39.0–52.0)
Hemoglobin: 17.1 g/dL — ABNORMAL HIGH (ref 13.0–17.0)
MCH: 34.3 pg — ABNORMAL HIGH (ref 26.0–34.0)
MCHC: 35.4 g/dL (ref 30.0–36.0)
MCV: 97 fL (ref 80.0–100.0)
Platelets: 82 10*3/uL — ABNORMAL LOW (ref 150–400)
RBC: 4.98 MIL/uL (ref 4.22–5.81)
RDW: 11.9 % (ref 11.5–15.5)
WBC: 9.6 10*3/uL (ref 4.0–10.5)
nRBC: 0 % (ref 0.0–0.2)

## 2018-10-20 LAB — LACTATE DEHYDROGENASE: LDH: 235 U/L — ABNORMAL HIGH (ref 98–192)

## 2018-10-20 LAB — SARS CORONAVIRUS 2 BY RT PCR (HOSPITAL ORDER, PERFORMED IN ~~LOC~~ HOSPITAL LAB): SARS Coronavirus 2: NEGATIVE

## 2018-10-20 LAB — LIPASE, BLOOD: Lipase: 170 U/L — ABNORMAL HIGH (ref 11–51)

## 2018-10-20 LAB — TROPONIN I (HIGH SENSITIVITY): Troponin I (High Sensitivity): 10 ng/L (ref <18)

## 2018-10-20 MED ORDER — NICOTINE 21 MG/24HR TD PT24
21.0000 mg | MEDICATED_PATCH | Freq: Every day | TRANSDERMAL | Status: DC
  Administered 2018-10-21 – 2018-10-22 (×2): 21 mg via TRANSDERMAL
  Filled 2018-10-20 (×3): qty 1

## 2018-10-20 MED ORDER — LABETALOL HCL 5 MG/ML IV SOLN
10.0000 mg | Freq: Once | INTRAVENOUS | Status: AC
  Administered 2018-10-20: 10 mg via INTRAVENOUS
  Filled 2018-10-20: qty 4

## 2018-10-20 MED ORDER — ADULT MULTIVITAMIN W/MINERALS CH
1.0000 | ORAL_TABLET | Freq: Every day | ORAL | Status: DC
  Administered 2018-10-21 – 2018-10-22 (×2): 1 via ORAL
  Filled 2018-10-20 (×2): qty 1

## 2018-10-20 MED ORDER — EMTRICITABINE-TENOFOVIR AF 200-25 MG PO TABS
1.0000 | ORAL_TABLET | Freq: Every day | ORAL | Status: DC
  Administered 2018-10-21 – 2018-10-22 (×2): 1 via ORAL
  Filled 2018-10-20 (×2): qty 1

## 2018-10-20 MED ORDER — LORAZEPAM 1 MG PO TABS: 0.0000 mg | ORAL_TABLET | Freq: Two times a day (BID) | ORAL | Status: DC

## 2018-10-20 MED ORDER — ONDANSETRON HCL 4 MG PO TABS: 4.0000 mg | ORAL_TABLET | Freq: Four times a day (QID) | ORAL | Status: DC | PRN

## 2018-10-20 MED ORDER — HYDRALAZINE HCL 20 MG/ML IJ SOLN
10.0000 mg | Freq: Four times a day (QID) | INTRAMUSCULAR | Status: DC | PRN
  Administered 2018-10-21 – 2018-10-22 (×3): 10 mg via INTRAVENOUS
  Filled 2018-10-20 (×3): qty 1

## 2018-10-20 MED ORDER — ACETAMINOPHEN 650 MG RE SUPP: 650.0000 mg | Freq: Four times a day (QID) | RECTAL | Status: DC | PRN

## 2018-10-20 MED ORDER — THIAMINE HCL 100 MG/ML IJ SOLN: 100.0000 mg | Freq: Every day | INTRAMUSCULAR | Status: DC

## 2018-10-20 MED ORDER — IOHEXOL 300 MG/ML  SOLN
100.0000 mL | Freq: Once | INTRAMUSCULAR | Status: AC | PRN
  Administered 2018-10-20: 100 mL via INTRAVENOUS

## 2018-10-20 MED ORDER — ONDANSETRON HCL 4 MG/2ML IJ SOLN: 4.0000 mg | Freq: Four times a day (QID) | INTRAMUSCULAR | Status: DC | PRN

## 2018-10-20 MED ORDER — SODIUM CHLORIDE 0.9% FLUSH: 3.0000 mL | Freq: Once | INTRAVENOUS | Status: DC

## 2018-10-20 MED ORDER — VITAMIN B-1 100 MG PO TABS: 100.0000 mg | ORAL_TABLET | Freq: Every day | ORAL | Status: DC

## 2018-10-20 MED ORDER — METOPROLOL TARTRATE 25 MG PO TABS
25.0000 mg | ORAL_TABLET | Freq: Two times a day (BID) | ORAL | Status: DC
  Administered 2018-10-21: 25 mg via ORAL
  Filled 2018-10-20 (×2): qty 1

## 2018-10-20 MED ORDER — SODIUM CHLORIDE 0.9 % IV BOLUS
1000.0000 mL | Freq: Once | INTRAVENOUS | Status: AC
  Administered 2018-10-20: 1000 mL via INTRAVENOUS

## 2018-10-20 MED ORDER — HYDROMORPHONE HCL 1 MG/ML IJ SOLN
0.5000 mg | Freq: Once | INTRAMUSCULAR | Status: AC
  Administered 2018-10-20: 0.5 mg via INTRAVENOUS
  Filled 2018-10-20: qty 1

## 2018-10-20 MED ORDER — ACETAMINOPHEN 325 MG PO TABS: 650.0000 mg | ORAL_TABLET | Freq: Four times a day (QID) | ORAL | Status: DC | PRN

## 2018-10-20 MED ORDER — LORAZEPAM 2 MG/ML IJ SOLN: 1.0000 mg | Freq: Four times a day (QID) | INTRAMUSCULAR | Status: DC | PRN

## 2018-10-20 MED ORDER — ENOXAPARIN SODIUM 40 MG/0.4ML ~~LOC~~ SOLN
40.0000 mg | SUBCUTANEOUS | Status: DC
  Administered 2018-10-20: 40 mg via SUBCUTANEOUS
  Filled 2018-10-20 (×2): qty 0.4

## 2018-10-20 MED ORDER — MORPHINE SULFATE (PF) 2 MG/ML IV SOLN: 2.0000 mg | INTRAVENOUS | Status: DC | PRN

## 2018-10-20 MED ORDER — LORAZEPAM 2 MG/ML IJ SOLN: 0.0000 mg | Freq: Four times a day (QID) | INTRAMUSCULAR | Status: DC

## 2018-10-20 MED ORDER — PANTOPRAZOLE SODIUM 40 MG IV SOLR: 40.0000 mg | Freq: Once | INTRAVENOUS | Status: DC

## 2018-10-20 MED ORDER — FOLIC ACID 1 MG PO TABS
1.0000 mg | ORAL_TABLET | Freq: Every day | ORAL | Status: DC
  Administered 2018-10-20 – 2018-10-22 (×3): 1 mg via ORAL
  Filled 2018-10-20 (×3): qty 1

## 2018-10-20 MED ORDER — PANTOPRAZOLE SODIUM 40 MG IV SOLR
40.0000 mg | Freq: Two times a day (BID) | INTRAVENOUS | Status: DC
  Administered 2018-10-20 – 2018-10-22 (×4): 40 mg via INTRAVENOUS
  Filled 2018-10-20 (×4): qty 40

## 2018-10-20 MED ORDER — LORAZEPAM 2 MG/ML IJ SOLN: 0.0000 mg | Freq: Two times a day (BID) | INTRAMUSCULAR | Status: DC

## 2018-10-20 MED ORDER — ONDANSETRON HCL 4 MG/2ML IJ SOLN
4.0000 mg | INTRAMUSCULAR | Status: AC
  Administered 2018-10-20: 4 mg via INTRAVENOUS
  Filled 2018-10-20: qty 2

## 2018-10-20 MED ORDER — LACTATED RINGERS IV SOLN
INTRAVENOUS | Status: DC
  Administered 2018-10-20 – 2018-10-22 (×7): via INTRAVENOUS

## 2018-10-20 MED ORDER — LISINOPRIL 20 MG PO TABS
20.0000 mg | ORAL_TABLET | Freq: Every day | ORAL | Status: DC
  Administered 2018-10-21: 20 mg via ORAL
  Filled 2018-10-20: qty 1

## 2018-10-20 MED ORDER — LORAZEPAM 1 MG PO TABS: 0.0000 mg | ORAL_TABLET | Freq: Four times a day (QID) | ORAL | Status: DC

## 2018-10-20 MED ORDER — LORAZEPAM 1 MG PO TABS: 1.0000 mg | ORAL_TABLET | Freq: Four times a day (QID) | ORAL | Status: DC | PRN

## 2018-10-20 MED ORDER — ASPIRIN EC 81 MG PO TBEC
81.0000 mg | DELAYED_RELEASE_TABLET | Freq: Every day | ORAL | Status: DC
  Administered 2018-10-21 – 2018-10-22 (×2): 81 mg via ORAL
  Filled 2018-10-20 (×2): qty 1

## 2018-10-20 MED ORDER — VITAMIN B-1 100 MG PO TABS
100.0000 mg | ORAL_TABLET | Freq: Every day | ORAL | Status: DC
  Administered 2018-10-20 – 2018-10-22 (×3): 100 mg via ORAL
  Filled 2018-10-20 (×3): qty 1

## 2018-10-20 MED ORDER — ATORVASTATIN CALCIUM 40 MG PO TABS
40.0000 mg | ORAL_TABLET | Freq: Every day | ORAL | Status: DC
  Administered 2018-10-21 – 2018-10-22 (×2): 40 mg via ORAL
  Filled 2018-10-20 (×2): qty 1

## 2018-10-20 NOTE — ED Notes (Signed)
ED TO INPATIENT HANDOFF REPORT  ED Nurse Name and Phone #: Ophelia Charter RN [phone]  S Name/Age/Gender [name] [name] 43 y.o. male Room/Bed: 019C/019C  Code Status   Code Status: Full Code  Home/SNF/Other Home Patient oriented to: self, place, time and situation Is this baseline? Yes   Triage Complete: Triage complete  Chief Complaint abd pain  Triage Note Pt reports generalized abd pain with constipation and bloating. Pt was seen at [facility] on 8/12 and did get magnesium citrate which did result in small BM but still having pain and bloating.    Allergies Allergies  Allergen Reactions  . Gadolinium Derivatives Nausea Only    MRI dye    Level of Care/Admitting Diagnosis ED Disposition    ED Disposition Condition [facility]: [place] [100100]  Level of Care: Med-Surg [16]  I expect the patient will be discharged within 24 hours: No (not a candidate for 5C-Observation unit)  Covid Evaluation: Asymptomatic Screening Protocol (No Symptoms)  Diagnosis: Alcoholic pancreatitis [952841]  Admitting Physician: Karmen Bongo [[id number]]  Attending Physician: Karmen Bongo [[id number]]  PT Class (Do Not Modify): Observation [104]  PT Acc Code (Do Not Modify): Observation [10022]       B Medical/Surgery History Past Medical History:  Diagnosis Date  . Anxiety   . Coronary artery disease    a. 05/2014 OOH MI/PCI: LM nl, LAD 40p, 99p (3.0x28 Synergy DES) w/ R->L Collaterals, LCX nl, RCA 30p/d, EF 55% w/ sev distal inf/ant HK.  . Depression   . Erectile dysfunction   . H/O echocardiogram    a. 05/2014 Echo: Ef 45-50%, sev mid antsept/ant HK.  Marland Kitchen Hyperlipidemia   . Hypertension    medication in 2012, stress as a big component  . 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    . Tobacco abuse    Past Surgical History:  Procedure Laterality Date  . CORONARY STENT PLACEMENT  05/24/2014   MID LAD DES   . LEFT HEART CATHETERIZATION WITH  CORONARY ANGIOGRAM N/A 05/24/2014   Procedure: LEFT HEART CATHETERIZATION WITH CORONARY ANGIOGRAM;  Surgeon: Jettie Booze, MD;  Location: [facility] CATH LAB;  Service: Cardiovascular;  Laterality: N/A;  . PERCUTANEOUS CORONARY STENT INTERVENTION (PCI-S)  05/24/2014   Procedure: PERCUTANEOUS CORONARY STENT INTERVENTION (PCI-S);  Surgeon: Jettie Booze, MD;  Location: [facility] CATH LAB;  Service: Cardiovascular;;  . PILONIDAL CYST EXCISION  1995     A IV Location/Drains/Wounds Patient Lines/Drains/Airways Status   Active Line/Drains/Airways    Name:   Placement date:   Placement time:   Site:   Days:   Peripheral IV 10/20/18 Right Antecubital   10/20/18    0929    Antecubital   less than 1          Intake/Output Last 24 hours  Intake/Output Summary (Last 24 hours) at 10/20/2018 1336 Last data filed at 10/20/2018 1211 Gross per 24 hour  Intake 1019.25 ml  Output -  Net 1019.25 ml    Labs/Imaging Results for orders placed or performed during the hospital encounter of 10/20/18 (from the past 48 hour(s))  Lipase, blood     Status: Abnormal   Collection Time: 10/20/18  7:29 AM  Result Value Ref Range   Lipase 170 (H) 11 - 51 U/L    Comment: Performed at [facility], [address], [place], [place] [address]  Comprehensive metabolic panel     Status: Abnormal   Collection Time:  10/20/18  7:29 AM  Result Value Ref Range   Sodium 131 (L) 135 - 145 mmol/L   Potassium 3.2 (L) 3.5 - 5.1 mmol/L   Chloride 88 (L) 98 - 111 mmol/L   CO2 26 22 - 32 mmol/L   Glucose, Bld 126 (H) 70 - 99 mg/dL   BUN 5 (L) 6 - 20 mg/dL   Creatinine, Ser 0.73 0.61 - 1.24 mg/dL   Calcium 9.2 8.9 - 10.3 mg/dL   Total Protein 7.6 6.5 - 8.1 g/dL   Albumin 3.9 3.5 - 5.0 g/dL   AST 35 15 - 41 U/L   ALT 20 0 - 44 U/L   Alkaline Phosphatase 94 38 - 126 U/L   Total Bilirubin 2.0 (H) 0.3 - 1.2 mg/dL   GFR calc non Af Amer >60 >60 mL/min   GFR calc Af Amer >60 >60 mL/min   Anion gap 17 (H) 5 - 15     Comment: Performed at [facility], [place] [address], [place], [place] [address]  CBC     Status: Abnormal   Collection Time: 10/20/18  7:29 AM  Result Value Ref Range   WBC 9.6 4.0 - 10.5 K/uL   RBC 4.98 4.22 - 5.81 MIL/uL   Hemoglobin 17.1 (H) 13.0 - 17.0 g/dL   HCT 48.3 39.0 - 52.0 %   MCV 97.0 80.0 - 100.0 fL   MCH 34.3 (H) 26.0 - 34.0 pg   MCHC 35.4 30.0 - 36.0 g/dL   RDW 11.9 11.5 - 15.5 %   Platelets 82 (L) 150 - 400 K/uL    Comment: REPEATED TO VERIFY PLATELET COUNT CONFIRMED BY SMEAR Immature Platelet Fraction may be clinically indicated, consider ordering this additional test [id number]    nRBC 0.0 0.0 - 0.2 %    Comment: Performed at [facility], [place] [address] [place], [place] [address]  Troponin I (High Sensitivity)     Status: None   Collection Time: 10/20/18  9:13 AM  Result Value Ref Range   Troponin I (High Sensitivity) 10 <18 ng/L    Comment: (NOTE) Elevated high sensitivity troponin I (hsTnI) values and significant  changes across serial measurements may suggest ACS but many other  chronic and acute conditions are known to elevate hsTnI results.  Refer to the "Links" section for chest pain algorithms and additional  guidance. Performed at [facility], [place] [address], [place], [place] [address]    Ct Abdomen Pelvis W Contrast  Result Date: 10/20/2018 CLINICAL DATA:  Abdominal pain and constipation. Concern for pancreatitis. EXAM: CT ABDOMEN AND PELVIS WITH CONTRAST TECHNIQUE: Multidetector CT imaging of the abdomen and pelvis was performed using the standard protocol following bolus administration of intravenous contrast. CONTRAST:  11m OMNIPAQUE IOHEXOL 300 MG/ML  SOLN COMPARISON:  None. FINDINGS: Lower chest: There is slight bibasilar atelectasis. Lung bases elsewhere are clear. Note that there is fluid in the distal esophagus, likely due to spontaneous gastroesophageal reflux. There is thickening of the wall of the distal esophagus.  Hepatobiliary: There is hepatic steatosis. No focal liver lesions are demonstrable. Gallbladder wall is not appreciably thickened. There is no biliary duct dilatation. Pancreas: There is subtle edema of the pancreas in a generalized manner. There is normal enhancement the pancreas without necrosis. There is slight fluid surrounding the head and uncinate process of the pancreas. No fluid collection or pseudocyst evident. No pancreatic calcification. No appreciable pancreatic duct dilatation. Spleen: No splenic lesions are evident. Adrenals/Urinary Tract:  Adrenals appear normal bilaterally. Kidneys bilaterally show no evident mass or hydronephrosis on either side. There is no evident renal or ureteral calculus on either side. Urinary bladder is midline with wall thickness within normal limits. Stomach/Bowel: There is moderate air in fluid material in the stomach. There is thickening of the wall of the gastric antrum and pylorus. There is wall thickening in the first and second portions of the duodenum. No ulceration or fistula evident in these areas. There is wall thickening in the region of the third portion of the duodenum as it abuts the uncinate process, likely secondary to the nearby pancreatitis. Elsewhere, there is no other bowel wall thickening. There is no evident bowel obstruction. Terminal ileum appears unremarkable. There is no free air or portal venous air. Vascular/Lymphatic: There are scattered foci of aortic and iliac artery atherosclerosis. No aneurysm evident. No adenopathy evident in the abdomen or pelvis. Reproductive: Prostate and seminal vesicles appear normal in size and contour. There is no evident pelvic mass. Other: The appendix appears normal. No evident abscess or ascites in the abdomen or pelvis. There is fat in the right inguinal ring. Musculoskeletal: There is degenerative change at L5-S1 with vacuum phenomenon at this level. There are no blastic or lytic bone lesions. There is no  appreciable intramuscular or abdominal wall lesion. IMPRESSION: 1. Pancreas is mildly edematous throughout its contour. Slight fluid is adjacent to the head and uncinate process of the pancreas. No well-defined fluid collection or pseudocyst. No pancreatic duct dilatation. No pancreatic calcification. No evident pancreatic necrosis. 2. Wall thickening in the gastric antrum consistent with a degree of gastritis. There is wall thickening in the proximal duodenum consistent with duodenitis. Wall thickening in the mid duodenum, particularly involving the proximal third portion of the duodenum, is likely due to adjacent pancreatitis. No gastric or duodenal ulceration is seen by CT. No fistula evident. 3. Apparent gastroesophageal reflux into the distal esophagus with wall thickening in the distal esophagus consistent with esophagitis. 4. No bowel obstruction. No abscess in the abdomen or pelvis. Appendix appears normal. 5. No evident renal or ureteral calculus. No hydronephrosis. Urinary bladder wall thickness normal. 6.  Hepatic steatosis. Electronically Signed   By: Lowella Grip III M.D.   On: 10/20/2018 10:57   Dg Abd Acute W/chest  Result Date: 10/18/2018 CLINICAL DATA:  Abdominal pain, vomiting and distention EXAM: DG ABDOMEN ACUTE W/ 1V CHEST COMPARISON:  11/10/2017 chest radiograph. FINDINGS: Stable cardiomediastinal silhouette with normal heart size. No pneumothorax. No pleural effusion. Lungs appear clear, with no acute consolidative airspace disease and no pulmonary edema. Fluid level in the stomach. No disproportionately dilated small bowel loops. Mild stool and moderate gas in the large bowel. No evidence of pneumatosis or pneumoperitoneum. No radiopaque nephrolithiasis. IMPRESSION: 1. No active cardiopulmonary disease. 2. Nonobstructive bowel gas pattern. Nonspecific fluid level in the stomach. Moderate gas and mild stool in the colon. Electronically Signed   By: Ilona Sorrel M.D.   On: 10/18/2018  15:39    Pending Labs Unresulted Labs (From admission, onward)    Start     Ordered   10/21/18 0500  Comprehensive metabolic panel  Tomorrow morning,   R     10/20/18 1239   10/21/18 0500  CBC  Tomorrow morning,   R     10/20/18 1239   10/20/18 1325  SARS CORONAVIRUS 2 Nasal Swab Aptima Multi Swab  (Asymptomatic/Tier 2 Patients Labs)  Once,   STAT    Question Answer Comment  Is  this test for diagnosis or screening Screening   Symptomatic for COVID-19 as defined by CDC No   Hospitalized for COVID-19 No   Admitted to ICU for COVID-19 No   Previously tested for COVID-19 No   Resident in a congregate (group) care setting No   Employed in healthcare setting No      10/20/18 1325          Vitals/Pain Today's Vitals   10/20/18 0721 10/20/18 0722 10/20/18 1245 10/20/18 1300  BP: (!) 170/112  (!) 186/102 (!) 181/112  Pulse: (!) 116  85 94  Resp: 16     Temp: 98 F (36.7 C)     TempSrc: Oral     SpO2: 99%  95% 98%  PainSc:  6       Isolation Precautions No active isolations  Medications Medications  aspirin EC tablet 81 mg (has no administration in time range)  emtricitabine-tenofovir AF (DESCOVY) 200-25 MG per tablet 1 tablet (has no administration in time range)  atorvastatin (LIPITOR) tablet 40 mg (has no administration in time range)  lisinopril (ZESTRIL) tablet 20 mg (has no administration in time range)  metoprolol tartrate (LOPRESSOR) tablet 25 mg (has no administration in time range)  nicotine (NICODERM CQ - dosed in mg/24 hours) patch 21 mg (has no administration in time range)  LORazepam (ATIVAN) injection 1 mg (has no administration in time range)  thiamine (VITAMIN B-1) tablet 100 mg (has no administration in time range)    Or  thiamine (B-1) injection 100 mg (has no administration in time range)  folic acid (FOLVITE) tablet 1 mg (has no administration in time range)  multivitamin with minerals tablet 1 tablet (has no administration in time range)  enoxaparin  (LOVENOX) injection 40 mg (has no administration in time range)  lactated ringers infusion ( Intravenous New Bag/Given 10/20/18 1244)  acetaminophen (TYLENOL) tablet 650 mg (has no administration in time range)    Or  acetaminophen (TYLENOL) suppository 650 mg (has no administration in time range)  ondansetron (ZOFRAN) tablet 4 mg (has no administration in time range)    Or  ondansetron (ZOFRAN) injection 4 mg (has no administration in time range)  pantoprazole (PROTONIX) injection 40 mg (has no administration in time range)  sodium chloride 0.9 % bolus 1,000 mL (0 mLs Intravenous Stopped 10/20/18 1211)  ondansetron (ZOFRAN) injection 4 mg (4 mg Intravenous Given 10/20/18 0953)  HYDROmorphone (DILAUDID) injection 0.5 mg (0.5 mg Intravenous Given 10/20/18 0954)  iohexol (OMNIPAQUE) 300 MG/ML solution 100 mL (100 mLs Intravenous Contrast Given 10/20/18 1047)    Mobility walks     Focused Assessments    R Recommendations: See Admitting Provider Note  Report given to:  Additional Notes:

## 2018-10-20 NOTE — ED Provider Notes (Addendum)
[name] EMERGENCY DEPARTMENT Provider Note   CSN: [id number] Arrival date & time: 10/20/18  0705     History   Chief Complaint Chief Complaint  Patient presents with  . Abdominal Pain    HPI [name] [name] is a 43 y.o. male with a past medical history significant for coronary artery disease, tobacco abuse, alcohol abuse, history of neurosyphilis and previous MI with poorly controlled hypertension who presents the emergency department with a chief complaint of abdominal pain.  Patient states that he was constipated earlier this week and he had some vomiting.  He took magnesium citrate on 2 days ago and had one very large watery stool but has not made a bowel movement since.  He has complaint of epigastric abdominal pain radiating to the back and bilateral sides. He has associated nausea without any vomiting  the past day.  He drinks about half 1/5 daily.  He denies getting shakes history of DTs or seizures.  Patient did not take his antihypertensive medications.  Patient states that he last drink yesterday evening.     HPI  Past Medical History:  Diagnosis Date  . Anxiety   . CAD (coronary artery disease), native coronary artery 05/24/2014   Anterior infarction occurring out of Hospital approximately week prior to admission 3/17 16  Cardiac catheterization 3/18 showing a proximal 40% segmental LAD, 99% mid LAD with collaterals from the right coronary artery, normal circumflex, 20-30% mid and distal right coronary artery stenosis.  3.0 x 28 mm Synergy stent by Dr. Irish Lack post dilated to 3.25 mm (incidental note that LAD was closed at the time of the PCI a few minutes after the catheterization but with no symptoms)  Echo shows distal anterior apical and distal inferior severe hypokinesis with EF around 50%   . Coronary artery disease    a. 05/2014 OOH MI/PCI: LM nl, LAD 40p, 99p (3.0x28 Synergy DES) w/ R->L Collaterals, LCX nl, RCA 30p/d, EF 55% w/ sev distal inf/ant  HK.  . Depression   . Erectile dysfunction   . H/O echocardiogram    a. 05/2014 Echo: Ef 45-50%, sev mid antsept/ant HK.  Marland Kitchen Hyperlipidemia   . Hypertension    medication in 2012, stress as a big component  . MI (myocardial infarction) ([place])   . 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    . Tobacco abuse     Patient Active Problem List   Diagnosis Date Noted  . Chest pain 11/10/2017  . Alcohol drinking problem 10/27/2017  . Food allergy 12/24/2015  . Hyperlipidemia   . Hypertension   . CAD (coronary artery disease), native coronary artery 05/24/2014  . Family history of premature CAD 05/08/2014  . History of syphilis   . High risk sexual behavior   . Tobacco use disorder     Past Surgical History:  Procedure Laterality Date  . CORONARY STENT PLACEMENT  05/24/2014   MID LAD DES   . LEFT HEART CATHETERIZATION WITH CORONARY ANGIOGRAM N/A 05/24/2014   Procedure: LEFT HEART CATHETERIZATION WITH CORONARY ANGIOGRAM;  Surgeon: Jettie Booze, MD;  Location: [facility] CATH LAB;  Service: Cardiovascular;  Laterality: N/A;  . PERCUTANEOUS CORONARY STENT INTERVENTION (PCI-S)  05/24/2014   Procedure: PERCUTANEOUS CORONARY STENT INTERVENTION (PCI-S);  Surgeon: Jettie Booze, MD;  Location: [facility] CATH LAB;  Service: Cardiovascular;;  . PILONIDAL CYST EXCISION  1995        Home Medications    Prior to Admission medications   Medication Sig  Start Date End Date Taking? Authorizing Provider  ALPRAZolam Duanne Moron) 0.5 MG tablet TAKE ONE TABLET BY MOUTH TWICE A DAY AS NEEDED FOR ANXIETY 12/21/17   Denita Lung, MD  aspirin EC 81 MG EC tablet Take 1 tablet (81 mg total) by mouth daily. 05/25/14   Theora Gianotti, NP  atorvastatin (LIPITOR) 40 MG tablet TAKE ONE TABLET BY MOUTH DAILY AT 6 PM. Patient taking differently: Take 40 mg by mouth every morning.  10/27/17   Denita Lung, MD  emtricitabine-tenofovir (TRUVADA) 200-300 MG tablet Take 1 tablet by mouth daily. 11/11/16    Denita Lung, MD  ferrous sulfate 324 MG TBEC Take 324 mg by mouth.    [provider]  lisinopril (ZESTRIL) 20 MG tablet Take 1 tablet (20 mg total) by mouth daily. 10/18/18   Vanessa Kick, MD  metoprolol tartrate (LOPRESSOR) 25 MG tablet Take 1 tablet (25 mg total) by mouth 2 (two) times daily. 10/27/17   Denita Lung, MD  Multiple Vitamin (MULTIVITAMIN WITH MINERALS) TABS tablet Take 1 tablet by mouth daily.    [provider]  ondansetron (ZOFRAN-ODT) 4 MG disintegrating tablet Take 1 tablet (4 mg total) by mouth every 8 (eight) hours as needed for nausea or vomiting. 10/18/18   Vanessa Kick, MD    Family History Family History  Problem Relation Age of Onset  . Thyroid disease Mother   . Heart disease Father 66       MI, stents  . Peripheral Artery Disease Father   . Diabetes type II Father   . Diabetes Father   . Diabetes Maternal Grandmother   . Hypertension Maternal Grandfather   . Hypertension Paternal Grandfather   . Cancer Neg Hx   . Stroke Neg Hx     Social History Social History   Tobacco Use  . Smoking status: Current Every Day Smoker    Packs/day: 1.00    Years: 26.00    Pack years: 26.00    Types: Cigarettes  . Smokeless tobacco: Never Used  Substance Use Topics  . Alcohol use: Yes    Alcohol/week: 24.0 standard drinks    Types: 24 Cans of beer per week    Comment: "3-5 times/wk I'll have 3-5 beers; max of 24 beers/wk"  . Drug use: No     Allergies   Gadolinium derivatives   Review of Systems Review of Systems   Physical Exam Updated Vital Signs BP (!) 170/112 (BP Location: Right Arm)   Pulse (!) 116   Temp 98 F (36.7 C) (Oral)   Resp 16   SpO2 99%   Physical Exam Vitals signs and nursing note reviewed.  Constitutional:      General: He is not in acute distress.    Appearance: He is well-developed. He is not diaphoretic.  HENT:     Head: Normocephalic and atraumatic.  Eyes:     General: No scleral icterus.     Conjunctiva/sclera: Conjunctivae normal.  Neck:     Musculoskeletal: Normal range of motion and neck supple.  Cardiovascular:     Rate and Rhythm: Normal rate and regular rhythm.     Heart sounds: Normal heart sounds.  Pulmonary:     Effort: Pulmonary effort is normal. No respiratory distress.     Breath sounds: Normal breath sounds.  Abdominal:     Palpations: Abdomen is soft.     Tenderness: There is abdominal tenderness in the right upper quadrant and epigastric area.  Skin:  General: Skin is warm and dry.  Neurological:     Mental Status: He is alert.  Psychiatric:        Behavior: Behavior normal.      ED Treatments / Results  Labs (all labs ordered are listed, but only abnormal results are displayed) Labs Reviewed  LIPASE, BLOOD - Abnormal; Notable for the following components:      Result Value   Lipase 170 (*)    All other components within normal limits  COMPREHENSIVE METABOLIC PANEL - Abnormal; Notable for the following components:   Sodium 131 (*)    Potassium 3.2 (*)    Chloride 88 (*)    Glucose, Bld 126 (*)    BUN 5 (*)    Total Bilirubin 2.0 (*)    Anion gap 17 (*)    All other components within normal limits  CBC - Abnormal; Notable for the following components:   Hemoglobin 17.1 (*)    MCH 34.3 (*)    Platelets 82 (*)    All other components within normal limits  URINALYSIS, ROUTINE W REFLEX MICROSCOPIC  TROPONIN I (HIGH SENSITIVITY)    EKG None  Radiology Dg Abd Acute W/chest  Result Date: 10/18/2018 CLINICAL DATA:  Abdominal pain, vomiting and distention EXAM: DG ABDOMEN ACUTE W/ 1V CHEST COMPARISON:  11/10/2017 chest radiograph. FINDINGS: Stable cardiomediastinal silhouette with normal heart size. No pneumothorax. No pleural effusion. Lungs appear clear, with no acute consolidative airspace disease and no pulmonary edema. Fluid level in the stomach. No disproportionately dilated small bowel loops. Mild stool and moderate gas in the large  bowel. No evidence of pneumatosis or pneumoperitoneum. No radiopaque nephrolithiasis. IMPRESSION: 1. No active cardiopulmonary disease. 2. Nonobstructive bowel gas pattern. Nonspecific fluid level in the stomach. Moderate gas and mild stool in the colon. Electronically Signed   By: Ilona Sorrel M.D.   On: 10/18/2018 15:39    Procedures Procedures (including critical care time)  Medications Ordered in ED Medications  sodium chloride flush (NS) 0.9 % injection 3 mL (has no administration in time range)  sodium chloride 0.9 % bolus 1,000 mL (has no administration in time range)  ondansetron (ZOFRAN) injection 4 mg (has no administration in time range)  HYDROmorphone (DILAUDID) injection 0.5 mg (has no administration in time range)     Initial Impression / Assessment and Plan / ED Course  I have reviewed the triage vital signs and the nursing notes.  Pertinent labs & imaging results that were available during my care of the patient were reviewed by me and considered in my medical decision making (see chart for details).  Clinical Course as of Oct 20 1410  Fri Oct 19, 7108  6923 43 year old male here with abdominal pain bloating constipation.  He is received some IV pain medicine and some Zofran with improvement in his symptoms.  His lipase is markedly elevated at 170 and his CT shows evidence of acute pancreatitis along with possibly some gastritis.  Likely will need to be admitted for pain control.    [MB]    Clinical Course User Index [MB] Hayden Rasmussen, MD       [id number] pain VS: BP (!) 189/118   Pulse 79   Temp 98 F (36.7 C) (Oral)   Resp 16   SpO2 98%  GN:FAOZHYQ is gathered by Patient  and emr. DDX:The causes for generalized abdominal pain include but are not limited to gastritis, gastroenteritis, IBS, pancreatitis, peritonitis, intestinal ischemia, constipation, UTI, intestinal obstruction, perforated viscus,  eg, peptic ulcer, appendix, gallbladder, diverticulitis,  physical or sexual abuse, abdominal abscess, ruptured spleen, AAA, diabetic ketoacidosis, hypercalcemia, uremia, parasitic or other infection, eg: tapeworms, flukes, Giardia, Cryto, Yersinia, adrenal insufficiency,lead poisoning, iron toxicity, polyarteritis nodosa, Henoch-Schnlein purpura, porphyria, eg, acute intermittent porphyria, familial Mediterranean fever.  Labs: I reviewed the labs which showed mild hypokalemia, Negative troponin. Slightly elevated glucose. No leukocytosis, elevated hgb likely due to smoking and elevated lipase. Imaging: I personally reviewed the images (CT a/p) which show(s) acute pancreatitis, duodenitis, and gastritis EKG:   EKG Interpretation  Date/Time:  Friday October 20 2018 09:36:38 EDT Ventricular Rate:  90 PR Interval:    QRS Duration: 89 QT Interval:  401 QTC Calculation: 491 R Axis:   -39 Text Interpretation:  Sinus rhythm Left axis deviation Consider anterior infarct Baseline wander in lead(s) V2 new changes compared with prior 9/19 Confirmed by Aletta Edouard [phone]) on 10/20/2018 9:42:52 AM Also confirmed by Aletta Edouard [phone]), editor Philomena Doheny [phone])  on 10/20/2018 10:01:02 AM       MDM:New onset alcoholic pancreatitis. Dr Antionette Poles admit to the medicine service. Patient disposition:admit Patient condition: stable. The patient appears reasonably screened and/or stabilized for discharge and I doubt any other medical condition or other [facility] requiring further screening, evaluation, or treatment in the ED at this time prior to discharge. I have discussed lab and/or imaging findings with the patient and answered all questions/concerns to the best of my ability. I have discussed return precautions and OP follow up.      Final Clinical Impressions(s) / ED Diagnoses   Final diagnoses:  Alcohol-induced acute pancreatitis without infection or necrosis  Gastritis and duodenitis    ED Discharge Orders    None       Margarita Mail, PA-C  10/20/18 1455    Hayden Rasmussen, MD 10/21/18 0827    Margarita Mail, PA-C 11/03/18 1729    Hayden Rasmussen, MD 11/04/18 1357

## 2018-10-20 NOTE — Progress Notes (Signed)
   10/20/18 2107  Vitals  Temp 99.9 F (37.7 C)  Temp Source Oral  BP (!) 191/113  MAP (mmHg) 134  BP Location Left Arm  BP Method Automatic  Patient Position (if appropriate) Lying  Pulse Rate 82  Pulse Rate Source Monitor  Resp 18  Oxygen Therapy  SpO2 97 %  O2 Device Room Air  MEWS Score  MEWS RR 0  MEWS Pulse 0  MEWS Systolic 0  MEWS LOC 0  MEWS Temp 0  MEWS Score 0  MEWS Score Color Green  Provider Notification  Provider Name/Title bodenheimer  Date Provider Notified 10/20/18  Time Provider Notified 2130  Notification Type Page  Notification Reason Change in status  Response See new orders  Date of Provider Response 10/20/18  Time of Provider Response 2135

## 2018-10-20 NOTE — H&P (Signed)
History and Physical    [name] [name] KDT:[id number] DOB: [birth year] DOA: 10/20/2018  PCP: Denita Lung, MD Consultants:  Wynonia Lawman - cardiology Patient coming from:  Home - lives alone; NOK: Heath Gold, [place]   Chief Complaint: Abdominal pain  HPI: [name] [name] is a 43 y.o. male with medical history significant of neurosyphilis (2016); CAD s/p DES; systolic CHF (EF 99-83% in 3/16); HTN; ETOH dependence; HLD; and depression/anxiety presenting with abdominal pain.  Diffuse abdominal pain with radiation to the back.  Pain started Monday, went to urgent care Wednesday thinking he had constipation.  He suggested getting a CT scan and here we are.  Last BM was Wednesday night and "just a splat", black in color.  He hadn't vomited since he was 19 but has vomited multiple times since this started.  Nausea is currently resolved.  Not feeling hungry.  CAGE score 2/4.   ED Course:  First episode of alcoholic pancreatitis.  Drinks 1/5 daily.  CT without pseudocyst or necrosis.  Still having pain.  Placed on CIWA.  Review of Systems: As per HPI; otherwise review of systems reviewed and negative.   Ambulatory Status:  Ambulates without assistance  Past Medical History:  Diagnosis Date   Anxiety    Coronary artery disease    a. 05/2014 OOH MI/PCI: LM nl, LAD 40p, 99p (3.0x28 Synergy DES) w/ R->L Collaterals, LCX nl, RCA 30p/d, EF 55% w/ sev distal inf/ant HK.   Depression    Erectile dysfunction    H/O echocardiogram    a. 05/2014 Echo: Ef 45-50%, sev mid antsept/ant HK.   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     Tobacco abuse     Past Surgical History:  Procedure Laterality Date   CORONARY STENT PLACEMENT  05/24/2014   MID LAD DES    LEFT HEART CATHETERIZATION WITH CORONARY ANGIOGRAM N/A 05/24/2014   Procedure: LEFT HEART CATHETERIZATION WITH CORONARY ANGIOGRAM;  Surgeon:  Jettie Booze, MD;  Location: [facility] CATH LAB;  Service: Cardiovascular;  Laterality: N/A;   PERCUTANEOUS CORONARY STENT INTERVENTION (PCI-S)  05/24/2014   Procedure: PERCUTANEOUS CORONARY STENT INTERVENTION (PCI-S);  Surgeon: Jettie Booze, MD;  Location: [facility] CATH LAB;  Service: Cardiovascular;;   PILONIDAL CYST EXCISION  1995    Social History   Socioeconomic History   Marital status: Single    Spouse name: Not on file   Number of children: Not on file   Years of education: Not on file   Highest education level: Not on file  Occupational History   Occupation: Secondary school teacher  Social Needs   Financial resource strain: Not on file   Food insecurity    Worry: Not on file    Inability: Not on file   Transportation needs    Medical: Not on file    Non-medical: Not on file  Tobacco Use   Smoking status: Current Every Day Smoker    Packs/day: 1.00    Years: 23.00    Pack years: 23.00    Types: Cigarettes   Smokeless tobacco: Never Used   Tobacco comment: currently smoking 0.5 ppd, declines patch  Substance and Sexual Activity   Alcohol use: Yes    Alcohol/week: 24.0 standard drinks    Types: 24 Cans of beer per week    Comment: 1/5-3/4 [place], 4-5 times a week.  +Guilty, cutting down; 2/4 CAGE questions.  Drug use: No   Sexual activity: Yes    Partners: Male  Lifestyle   Physical activity    Days per week: Not on file    Minutes per session: Not on file   Stress: Not on file  Relationships   Social connections    Talks on phone: Not on file    Gets together: Not on file    Attends religious service: Not on file    Active member of club or organization: Not on file    Attends meetings of clubs or organizations: Not on file    Relationship status: Not on file   Intimate partner violence    Fear of current or ex partner: Not on file    Emotionally abused: Not on file    Physically abused: Not on file    Forced sexual activity: Not  on file  Other Topics Concern   Not on file  Social History Narrative   Lives at home with roommate, exercise at gym in apartment complex, diet - no discretion.        Allergies  Allergen Reactions   Gadolinium Derivatives Nausea Only    MRI dye    Family History  Problem Relation Age of Onset   Thyroid disease Mother    Heart disease Father 8       MI, stents   Peripheral Artery Disease Father    Diabetes type II Father    Diabetes Father    Diabetes Maternal Grandmother    Hypertension Maternal Grandfather    Hypertension Paternal Grandfather    Cancer Neg Hx    Stroke Neg Hx     Prior to Admission medications   Medication Sig Start Date End Date Taking? Authorizing Provider  ALPRAZolam (XANAX) 0.5 MG tablet TAKE ONE TABLET BY MOUTH TWICE A DAY AS NEEDED FOR ANXIETY Patient taking differently: Take 0.5 mg by mouth daily as needed for anxiety.  12/21/17  Yes Denita Lung, MD  aspirin EC 81 MG EC tablet Take 1 tablet (81 mg total) by mouth daily. 05/25/14  Yes Theora Gianotti, NP  atorvastatin (LIPITOR) 40 MG tablet TAKE ONE TABLET BY MOUTH DAILY AT 6 PM. Patient taking differently: Take 40 mg by mouth every morning.  10/27/17  Yes Denita Lung, MD  emtricitabine-tenofovir AF (DESCOVY) 200-25 MG tablet Take 1 tablet by mouth daily.   Yes [provider]  ferrous sulfate 324 MG TBEC Take 324 mg by mouth.   Yes [provider]  lisinopril (ZESTRIL) 20 MG tablet Take 1 tablet (20 mg total) by mouth daily. 10/18/18  Yes Vanessa Kick, MD  metoprolol tartrate (LOPRESSOR) 25 MG tablet Take 1 tablet (25 mg total) by mouth 2 (two) times daily. 10/27/17  Yes Denita Lung, MD  Multiple Vitamin (MULTIVITAMIN WITH MINERALS) TABS tablet Take 1 tablet by mouth daily.   Yes [provider]  nicotine (NICODERM CQ - DOSED IN MG/24 HOURS) 21 mg/24hr patch Place 21 mg onto the skin daily.   Yes [provider]    Physical  Exam: Vitals:   10/20/18 0721 10/20/18 1245 10/20/18 1300 10/20/18 1315  BP: (!) 170/112 (!) 186/102 (!) 181/112 (!) 189/118  Pulse: (!) 116 85 94 79  Resp: 16   16  Temp: 98 F (36.7 C)     TempSrc: Oral     SpO2: 99% 95% 98% 98%      General:  Appears calm and comfortable and is NAD  Eyes:  PERRL, EOMI, normal lids, iris  ENT:  grossly normal hearing, lips & tongue, mmm; appropriate dentition  Neck:  no LAD, masses or thyromegaly  Cardiovascular:  RRR, no m/r/g. No LE edema.   Respiratory:   CTA bilaterally with no wheezes/rales/rhonchi.  Normal respiratory effort.  Abdomen:  soft, mild diffuse TTP with more TTP in midepigastric region, ND, NABS  Back:   normal alignment, no CVAT  Skin:  no rash or induration seen on limited exam  Musculoskeletal:  grossly normal tone BUE/BLE, good ROM, no bony abnormality  Psychiatric:  grossly normal mood and affect, speech fluent and appropriate, AOx3  Neurologic:  CN 2-12 grossly intact, moves all extremities in coordinated fashion, sensation intact    Radiological Exams on Admission: Ct Abdomen Pelvis W Contrast  Result Date: 10/20/2018 CLINICAL DATA:  Abdominal pain and constipation. Concern for pancreatitis. EXAM: CT ABDOMEN AND PELVIS WITH CONTRAST TECHNIQUE: Multidetector CT imaging of the abdomen and pelvis was performed using the standard protocol following bolus administration of intravenous contrast. CONTRAST:  185m OMNIPAQUE IOHEXOL 300 MG/ML  SOLN COMPARISON:  None. FINDINGS: Lower chest: There is slight bibasilar atelectasis. Lung bases elsewhere are clear. Note that there is fluid in the distal esophagus, likely due to spontaneous gastroesophageal reflux. There is thickening of the wall of the distal esophagus. Hepatobiliary: There is hepatic steatosis. No focal liver lesions are demonstrable. Gallbladder wall is not appreciably thickened. There is no biliary duct dilatation. Pancreas: There is subtle edema of the  pancreas in a generalized manner. There is normal enhancement the pancreas without necrosis. There is slight fluid surrounding the head and uncinate process of the pancreas. No fluid collection or pseudocyst evident. No pancreatic calcification. No appreciable pancreatic duct dilatation. Spleen: No splenic lesions are evident. Adrenals/Urinary Tract: Adrenals appear normal bilaterally. Kidneys bilaterally show no evident mass or hydronephrosis on either side. There is no evident renal or ureteral calculus on either side. Urinary bladder is midline with wall thickness within normal limits. Stomach/Bowel: There is moderate air in fluid material in the stomach. There is thickening of the wall of the gastric antrum and pylorus. There is wall thickening in the first and second portions of the duodenum. No ulceration or fistula evident in these areas. There is wall thickening in the region of the third portion of the duodenum as it abuts the uncinate process, likely secondary to the nearby pancreatitis. Elsewhere, there is no other bowel wall thickening. There is no evident bowel obstruction. Terminal ileum appears unremarkable. There is no free air or portal venous air. Vascular/Lymphatic: There are scattered foci of aortic and iliac artery atherosclerosis. No aneurysm evident. No adenopathy evident in the abdomen or pelvis. Reproductive: Prostate and seminal vesicles appear normal in size and contour. There is no evident pelvic mass. Other: The appendix appears normal. No evident abscess or ascites in the abdomen or pelvis. There is fat in the right inguinal ring. Musculoskeletal: There is degenerative change at L5-S1 with vacuum phenomenon at this level. There are no blastic or lytic bone lesions. There is no appreciable intramuscular or abdominal wall lesion. IMPRESSION: 1. Pancreas is mildly edematous throughout its contour. Slight fluid is adjacent to the head and uncinate process of the pancreas. No well-defined  fluid collection or pseudocyst. No pancreatic duct dilatation. No pancreatic calcification. No evident pancreatic necrosis. 2. Wall thickening in the gastric antrum consistent with a degree of gastritis. There is wall thickening in the proximal duodenum consistent with duodenitis. Wall thickening in the mid duodenum,  particularly involving the proximal third portion of the duodenum, is likely due to adjacent pancreatitis. No gastric or duodenal ulceration is seen by CT. No fistula evident. 3. Apparent gastroesophageal reflux into the distal esophagus with wall thickening in the distal esophagus consistent with esophagitis. 4. No bowel obstruction. No abscess in the abdomen or pelvis. Appendix appears normal. 5. No evident renal or ureteral calculus. No hydronephrosis. Urinary bladder wall thickness normal. 6.  Hepatic steatosis. Electronically Signed   By: Lowella Grip III M.D.   On: 10/20/2018 10:57   Dg Abd Acute W/chest  Result Date: 10/18/2018 CLINICAL DATA:  Abdominal pain, vomiting and distention EXAM: DG ABDOMEN ACUTE W/ 1V CHEST COMPARISON:  11/10/2017 chest radiograph. FINDINGS: Stable cardiomediastinal silhouette with normal heart size. No pneumothorax. No pleural effusion. Lungs appear clear, with no acute consolidative airspace disease and no pulmonary edema. Fluid level in the stomach. No disproportionately dilated small bowel loops. Mild stool and moderate gas in the large bowel. No evidence of pneumatosis or pneumoperitoneum. No radiopaque nephrolithiasis. IMPRESSION: 1. No active cardiopulmonary disease. 2. Nonobstructive bowel gas pattern. Nonspecific fluid level in the stomach. Moderate gas and mild stool in the colon. Electronically Signed   By: Ilona Sorrel M.D.   On: 10/18/2018 15:39    EKG: Independently reviewed.  NSR with rate 90; nonspecific ST changes that appear to be new compared to prior with TWI inversion in non-contiguous leads   Labs on Admission: I have personally  reviewed the available labs and imaging studies at the time of the admission.  Pertinent labs:   Na++ 131 K+ 3.2 CO2 26 BUN 5/Creatinine 0.73/GFR >60 Anion gap 17 Lipase 170 Bili 2.0 HS troponin 10 WBC 9.6 Hgb 17.1 Platelets 82 COVID pending   Assessment/Plan Principal Problem:   Alcoholic pancreatitis Active Problems:   High risk sexual behavior   Tobacco use disorder   Hyperlipidemia   Hypertension   Alcohol dependence with uncomplicated withdrawal (HCC)   Alcoholic pancreatitis -Patient without prior h/o pancreatitis but known ETOH dependence presenting with frank pancreatitis by H&P, mildly elevated lipase, and abnormal CT -CT also appears to show esophagitis/gastritis -His LDH is pending, but assuming it is <300, his score is 0 with a mortality risk of 0.9%. -Will observe for now -Strict NPO for now -Aggressive IVF hydration at least for the first 12 hours with LR at 200 cc/hr -Pain control with morphine 2 mg q2h prn.   -Nausea control with Zofran -Based on prior h/o ETOH dependence, this is most likely alcoholic pancreatitis and he will need cessation. -Will order Protonix IV BID given gastritis/esophagitis. -If not improving by tomorrow, consider GI consultation for possible EGD.  HTN -Continue Lisinopril and Lopressor  HLD -Continue Lipitor  ETOH abuse -Acknowledges that he might have a problem with ETOH and has been trying to cut back with guilt about his drinking -Denies h/o withdrawal -Will place on CIWA protocol -Thrombocytopenia is thought to be due to bone marrow suppression in the setting of heavy ETOH use  Tobacco dependence -Encourage cessation.  This was discussed with the patient and should be reviewed on an ongoing basis.   -Patch ordered at patient request; however, he was charged $30 per patch on prior admission and so requests to be able to use patches that he obtained from the [employer] since he does not have insurance  High-risk  sexual behavior -Patient has male partners and h/o neurosyphilis -Continue Descovy -Recheck HIV ( last checked in 9/19)  Note: This  patient has been tested and is pending for the novel coronavirus COVID-19.  DVT prophylaxis:  Lovenox  Code Status: Full - confirmed with patient Family Communication: None present Disposition Plan:  Home once clinically improved Consults called: None  Admission status: It is my clinical opinion that referral for OBSERVATION is reasonable and necessary in this patient based on the above information provided. The aforementioned taken together are felt to place the patient at high risk for further clinical deterioration. However it is anticipated that the patient may be medically stable for discharge from the hospital within 24 to 48 hours.    Karmen Bongo MD [facility]   How to contact the [facility] Attending or Consulting provider [place] or covering provider during after hours [place], for this patient?  1. Check the care team in [facility] and look for a) attending/consulting [facility] provider listed and b) the [facility] team listed 2. Log into [email or web address] and use [place]'s universal password to access. If you do not have the password, please contact the hospital operator. 3. Locate the [facility] provider you are looking for under [facility] and page to a number that you can be directly reached. 4. If you still have difficulty reaching the provider, please page the [facility] (Director on Call) for the Hospitalists listed on amion for assistance.   10/20/2018, 2:43 PM

## 2018-10-20 NOTE — ED Triage Notes (Signed)
Pt reports generalized abd pain with constipation and bloating. Pt was seen at [facility] on 8/12 and did get magnesium citrate which did result in small BM but still having pain and bloating.

## 2018-10-21 DIAGNOSIS — K859 Acute pancreatitis without necrosis or infection, unspecified: Secondary | ICD-10-CM | POA: Diagnosis present

## 2018-10-21 DIAGNOSIS — E785 Hyperlipidemia, unspecified: Secondary | ICD-10-CM

## 2018-10-21 LAB — COMPREHENSIVE METABOLIC PANEL
ALT: 16 U/L (ref 0–44)
AST: 26 U/L (ref 15–41)
Albumin: 3.3 g/dL — ABNORMAL LOW (ref 3.5–5.0)
Alkaline Phosphatase: 74 U/L (ref 38–126)
Anion gap: 16 — ABNORMAL HIGH (ref 5–15)
BUN: <5 mg/dL — ABNORMAL LOW (ref 6–20)
CO2: 25 mmol/L (ref 22–32)
Calcium: 8.9 mg/dL (ref 8.9–10.3)
Chloride: 91 mmol/L — ABNORMAL LOW (ref 98–111)
Creatinine, Ser: 0.62 mg/dL (ref 0.61–1.24)
GFR calc Af Amer: >60 mL/min (ref >60)
GFR calc non Af Amer: >60 mL/min (ref >60)
Glucose, Bld: 123 mg/dL — ABNORMAL HIGH (ref 70–99)
Potassium: 2.6 mmol/L — CL (ref 3.5–5.1)
Sodium: 132 mmol/L — ABNORMAL LOW (ref 135–145)
Total Bilirubin: 2.1 mg/dL — ABNORMAL HIGH (ref 0.3–1.2)
Total Protein: 6.7 g/dL (ref 6.5–8.1)

## 2018-10-21 LAB — CBC
HCT: 42.7 % (ref 39.0–52.0)
Hemoglobin: 15.5 g/dL (ref 13.0–17.0)
MCH: 34.7 pg — ABNORMAL HIGH (ref 26.0–34.0)
MCHC: 36.3 g/dL — ABNORMAL HIGH (ref 30.0–36.0)
MCV: 95.5 fL (ref 80.0–100.0)
Platelets: 58 10*3/uL — ABNORMAL LOW (ref 150–400)
RBC: 4.47 MIL/uL (ref 4.22–5.81)
RDW: 11.9 % (ref 11.5–15.5)
WBC: 7.7 10*3/uL (ref 4.0–10.5)
nRBC: 0 % (ref 0.0–0.2)

## 2018-10-21 LAB — HIV ANTIBODY (ROUTINE TESTING W REFLEX): HIV Screen 4th Generation wRfx: NONREACTIVE

## 2018-10-21 LAB — MAGNESIUM: Magnesium: 1.5 mg/dL — ABNORMAL LOW (ref 1.7–2.4)

## 2018-10-21 MED ORDER — POTASSIUM CHLORIDE CRYS ER 20 MEQ PO TBCR
40.0000 meq | EXTENDED_RELEASE_TABLET | ORAL | Status: AC
  Administered 2018-10-21 (×2): 40 meq via ORAL
  Filled 2018-10-21 (×2): qty 2

## 2018-10-21 MED ORDER — METOPROLOL TARTRATE 50 MG PO TABS
50.0000 mg | ORAL_TABLET | Freq: Two times a day (BID) | ORAL | Status: DC
  Administered 2018-10-21 – 2018-10-22 (×2): 50 mg via ORAL
  Filled 2018-10-21 (×2): qty 1

## 2018-10-21 MED ORDER — LISINOPRIL 40 MG PO TABS
40.0000 mg | ORAL_TABLET | Freq: Every day | ORAL | Status: DC
  Administered 2018-10-22: 40 mg via ORAL
  Filled 2018-10-21: qty 1

## 2018-10-21 NOTE — Progress Notes (Signed)
Patient ID: [name] [name], male   DOB: [birth year], 43 y.o.   MRN: [id number]  PROGRESS NOTE    [name] [name]  XJO:[id number] DOB: 06-16-1975 DOA: 10/20/2018 PCP: Denita Lung, MD   Brief Narrative:  43 year old male with history of neurosyphilis in 2016, CAD status post DES, systolic CHF with EF of 45- 50% in 3/16; hypertension, alcohol dependence, hyperlipidemia, depression and anxiety presented with abdominal pain.  CT of the abdomen showed probable pancreatitis without pseudocyst or necrosis with gastritis and duodenitis.  Lipase was 170.  He was started on IV fluids.  Assessment & Plan:   Alcoholic pancreatitis -Presented with abdominal pain, lipase of 170 and abnormal CT of the abdomen showing probable pancreatitis without pseudocyst or necrosis with gastritis and duodenitis -Currently n.p.o. and improving.  Will start clear liquid diet and advance diet gradually if tolerated.  Decrease IV fluids to 125 cc an hour.  Continue analgesics and antiemetics as needed. -Continue IV Protonix for now.  Alcohol abuse  -No signs of withdrawal right now.  Continue CIWA protocol.  Continue folic acid, multivitamin and thiamine.  Thrombocytopenia -Probably from alcohol abuse.  No signs of bleeding.  Monitor.  Hypokalemia -Replace.  Repeat a.m. labs  Hypomagnesemia -Replace.  Repeat a.m. labs  Hypertension -Continue lisinopril and Lopressor. blood pressure on the high side.  Hyperlipidemia  -continue Lipitor  High risk sexual behavior -Patient has male partners and history of neurosyphilis -Continue Descovy.  Outpatient follow-up   DVT prophylaxis: DC Lovenox because of thrombocytopenia.  Start SCDs. Code Status: Full Family Communication: None at bedside Disposition Plan: Home in 1 to 2 days once clinically improved  Consultants: None  Procedures: None  Antimicrobials: None   Subjective: Patient seen and examined at bedside. He feels a little better; currently no nausea  or vomiting. No overnight fever.   Objective: Vitals:   10/20/18 1607 10/20/18 1642 10/20/18 2107 10/21/18 0913  BP: (!) 189/117 (!) 194/104 (!) 191/113 (!) 190/103  Pulse:  88 82 73  Resp:  16 18 18   Temp:  99.3 F (37.4 C) 99.9 F (37.7 C) 99 F (37.2 C)  TempSrc:  Oral Oral Oral  SpO2:  98% 97% 98%    Intake/Output Summary (Last 24 hours) at 10/21/2018 1022 Last data filed at 10/21/2018 0600 Gross per 24 hour  Intake 3083.26 ml  Output 0 ml  Net 3083.26 ml   Filed Weights    Examination:  General exam: Appears calm and comfortable  Respiratory system: Bilateral decreased breath sounds at bases Cardiovascular system: S1 & S2 heard, Rate controlled Gastrointestinal system: Abdomen is nondistended, soft and mildly tender around the epigastric region. Normal bowel sounds heard. Extremities: No cyanosis, clubbing, edema    Data Reviewed: I have personally reviewed following labs and imaging studies  CBC: Recent Labs  Lab 10/20/18 0729 10/21/18 0530  WBC 9.6 7.7  HGB 17.1* 15.5  HCT 48.3 42.7  MCV 97.0 95.5  PLT 82* 58*   Basic Metabolic Panel: Recent Labs  Lab 10/20/18 0729 10/21/18 0530  NA 131* 132*  K 3.2* 2.6*  CL 88* 91*  CO2 26 25  GLUCOSE 126* 123*  BUN 5* <5*  CREATININE 0.73 0.62  CALCIUM 9.2 8.9  MG  --  1.5*   GFR: CrCl cannot be calculated (Unknown ideal weight.). Liver Function Tests: Recent Labs  Lab 10/20/18 0729 10/21/18 0530  AST 35 26  ALT 20 16  ALKPHOS 94 74  BILITOT 2.0* 2.1*  PROT 7.6 6.7  ALBUMIN 3.9 3.3*   Recent Labs  Lab 10/20/18 0729  LIPASE 170*   No results for input(s): AMMONIA in the last 168 hours. Coagulation Profile: No results for input(s): INR, PROTIME in the last 168 hours. Cardiac Enzymes: No results for input(s): CKTOTAL, CKMB, CKMBINDEX, TROPONINI in the last 168 hours. BNP (last 3 results) No results for input(s): PROBNP in the last 8760 hours. HbA1C: No results for input(s): HGBA1C in the  last 72 hours. CBG: No results for input(s): GLUCAP in the last 168 hours. Lipid Profile: No results for input(s): CHOL, HDL, LDLCALC, TRIG, CHOLHDL, LDLDIRECT in the last 72 hours. Thyroid Function Tests: No results for input(s): TSH, T4TOTAL, FREET4, T3FREE, THYROIDAB in the last 72 hours. Anemia Panel: No results for input(s): VITAMINB12, FOLATE, FERRITIN, TIBC, IRON, RETICCTPCT in the last 72 hours. Sepsis Labs: No results for input(s): PROCALCITON, LATICACIDVEN in the last 168 hours.  Recent Results (from the past 240 hour(s))  SARS Coronavirus 2 [facility] order, Performed in [facility] hospital lab)     Status: None   Collection Time: 10/20/18  1:33 PM  Result Value Ref Range Status   SARS Coronavirus 2 NEGATIVE NEGATIVE Final    Comment: (NOTE) If result is NEGATIVE SARS-CoV-2 target nucleic acids are NOT DETECTED. The SARS-CoV-2 RNA is generally detectable in upper and lower  respiratory specimens during the acute phase of infection. The lowest  concentration of SARS-CoV-2 viral copies this assay can detect is 250  copies / mL. A negative result does not preclude SARS-CoV-2 infection  and should not be used as the sole basis for treatment or other  patient management decisions.  A negative result may occur with  improper specimen collection / handling, submission of specimen other  than nasopharyngeal swab, presence of viral mutation(s) within the  areas targeted by this assay, and inadequate number of viral copies  (<250 copies / mL). A negative result must be combined with clinical  observations, patient history, and epidemiological information. If result is POSITIVE SARS-CoV-2 target nucleic acids are DETECTED. The SARS-CoV-2 RNA is generally detectable in upper and lower  respiratory specimens dur ing the acute phase of infection.  Positive  results are indicative of active infection with SARS-CoV-2.  Clinical  correlation with patient history and other diagnostic  information is  necessary to determine patient infection status.  Positive results do  not rule out bacterial infection or co-infection with other viruses. If result is PRESUMPTIVE POSTIVE SARS-CoV-2 nucleic acids MAY BE PRESENT.   A presumptive positive result was obtained on the submitted specimen  and confirmed on repeat testing.  While 2019 novel coronavirus  (SARS-CoV-2) nucleic acids may be present in the submitted sample  additional confirmatory testing may be necessary for epidemiological  and / or clinical management purposes  to differentiate between  SARS-CoV-2 and other Sarbecovirus currently known to infect humans.  If clinically indicated additional testing with an alternate test  methodology [phone]) is advised. The SARS-CoV-2 RNA is generally  detectable in upper and lower respiratory sp ecimens during the acute  phase of infection. The expected result is Negative. Fact Sheet for Patients:  StrictlyIdeas.no Fact Sheet for Healthcare Providers: BankingDealers.co.za This test is not yet approved or cleared by the [place] FDA and has been authorized for detection and/or diagnosis of SARS-CoV-2 by FDA under an Emergency Use Authorization (EUA).  This EUA will remain in effect (meaning this test can be used) for the duration of the COVID-19 declaration under Section 564(b)(1) of  the Act, 21 U.S.C. section 360bbb-3(b)(1), unless the authorization is terminated or revoked sooner. Performed at [facility], [place] [address], [place], [place] [address]          Radiology Studies: Ct Abdomen Pelvis W Contrast  Result Date: 10/20/2018 CLINICAL DATA:  Abdominal pain and constipation. Concern for pancreatitis. EXAM: CT ABDOMEN AND PELVIS WITH CONTRAST TECHNIQUE: Multidetector CT imaging of the abdomen and pelvis was performed using the standard protocol following bolus administration of intravenous contrast. CONTRAST:   166m OMNIPAQUE IOHEXOL 300 MG/ML  SOLN COMPARISON:  None. FINDINGS: Lower chest: There is slight bibasilar atelectasis. Lung bases elsewhere are clear. Note that there is fluid in the distal esophagus, likely due to spontaneous gastroesophageal reflux. There is thickening of the wall of the distal esophagus. Hepatobiliary: There is hepatic steatosis. No focal liver lesions are demonstrable. Gallbladder wall is not appreciably thickened. There is no biliary duct dilatation. Pancreas: There is subtle edema of the pancreas in a generalized manner. There is normal enhancement the pancreas without necrosis. There is slight fluid surrounding the head and uncinate process of the pancreas. No fluid collection or pseudocyst evident. No pancreatic calcification. No appreciable pancreatic duct dilatation. Spleen: No splenic lesions are evident. Adrenals/Urinary Tract: Adrenals appear normal bilaterally. Kidneys bilaterally show no evident mass or hydronephrosis on either side. There is no evident renal or ureteral calculus on either side. Urinary bladder is midline with wall thickness within normal limits. Stomach/Bowel: There is moderate air in fluid material in the stomach. There is thickening of the wall of the gastric antrum and pylorus. There is wall thickening in the first and second portions of the duodenum. No ulceration or fistula evident in these areas. There is wall thickening in the region of the third portion of the duodenum as it abuts the uncinate process, likely secondary to the nearby pancreatitis. Elsewhere, there is no other bowel wall thickening. There is no evident bowel obstruction. Terminal ileum appears unremarkable. There is no free air or portal venous air. Vascular/Lymphatic: There are scattered foci of aortic and iliac artery atherosclerosis. No aneurysm evident. No adenopathy evident in the abdomen or pelvis. Reproductive: Prostate and seminal vesicles appear normal in size and contour. There is  no evident pelvic mass. Other: The appendix appears normal. No evident abscess or ascites in the abdomen or pelvis. There is fat in the right inguinal ring. Musculoskeletal: There is degenerative change at L5-S1 with vacuum phenomenon at this level. There are no blastic or lytic bone lesions. There is no appreciable intramuscular or abdominal wall lesion. IMPRESSION: 1. Pancreas is mildly edematous throughout its contour. Slight fluid is adjacent to the head and uncinate process of the pancreas. No well-defined fluid collection or pseudocyst. No pancreatic duct dilatation. No pancreatic calcification. No evident pancreatic necrosis. 2. Wall thickening in the gastric antrum consistent with a degree of gastritis. There is wall thickening in the proximal duodenum consistent with duodenitis. Wall thickening in the mid duodenum, particularly involving the proximal third portion of the duodenum, is likely due to adjacent pancreatitis. No gastric or duodenal ulceration is seen by CT. No fistula evident. 3. Apparent gastroesophageal reflux into the distal esophagus with wall thickening in the distal esophagus consistent with esophagitis. 4. No bowel obstruction. No abscess in the abdomen or pelvis. Appendix appears normal. 5. No evident renal or ureteral calculus. No hydronephrosis. Urinary bladder wall thickness normal. 6.  Hepatic steatosis. Electronically Signed   By: WLowella GripIII M.D.   On: 10/20/2018  10:57        Scheduled Meds: . aspirin EC  81 mg Oral Daily  . atorvastatin  40 mg Oral Daily  . emtricitabine-tenofovir AF  1 tablet Oral Daily  . enoxaparin (LOVENOX) injection  40 mg Subcutaneous Q24H  . folic acid  1 mg Oral Daily  . lisinopril  20 mg Oral Daily  . metoprolol tartrate  25 mg Oral BID  . multivitamin with minerals  1 tablet Oral Daily  . nicotine  21 mg Transdermal Daily  . pantoprazole (PROTONIX) IV  40 mg Intravenous Q12H  . potassium chloride  40 mEq Oral Q4H  . thiamine   100 mg Oral Daily   Or  . thiamine  100 mg Intravenous Daily   Continuous Infusions: . lactated ringers 200 mL/hr at 10/21/18 0714     LOS: 0 days        Aline August, MD [facility] 10/21/2018, 10:22 AM

## 2018-10-21 NOTE — Plan of Care (Signed)
  Problem: Activity: Goal: Risk for activity intolerance will decrease Outcome: Progressing   

## 2018-10-22 LAB — CBC WITH DIFFERENTIAL/PLATELET
Abs Immature Granulocytes: 0.03 10*3/uL (ref 0.00–0.07)
Basophils Absolute: 0 10*3/uL (ref 0.0–0.1)
Basophils Relative: 0 %
Eosinophils Absolute: 0 10*3/uL (ref 0.0–0.5)
Eosinophils Relative: 0 %
HCT: 42.5 % (ref 39.0–52.0)
Hemoglobin: 15.2 g/dL (ref 13.0–17.0)
Immature Granulocytes: 0 %
Lymphocytes Relative: 19 %
Lymphs Abs: 1.4 10*3/uL (ref 0.7–4.0)
MCH: 34.3 pg — ABNORMAL HIGH (ref 26.0–34.0)
MCHC: 35.8 g/dL (ref 30.0–36.0)
MCV: 95.9 fL (ref 80.0–100.0)
Monocytes Absolute: 0.9 10*3/uL (ref 0.1–1.0)
Monocytes Relative: 13 %
Neutro Abs: 4.7 10*3/uL (ref 1.7–7.7)
Neutrophils Relative %: 68 %
Platelets: 58 10*3/uL — ABNORMAL LOW (ref 150–400)
RBC: 4.43 MIL/uL (ref 4.22–5.81)
RDW: 12.1 % (ref 11.5–15.5)
WBC: 7 10*3/uL (ref 4.0–10.5)
nRBC: 0 % (ref 0.0–0.2)

## 2018-10-22 LAB — COMPREHENSIVE METABOLIC PANEL
ALT: 15 U/L (ref 0–44)
AST: 27 U/L (ref 15–41)
Albumin: 3.3 g/dL — ABNORMAL LOW (ref 3.5–5.0)
Alkaline Phosphatase: 69 U/L (ref 38–126)
Anion gap: 14 (ref 5–15)
BUN: <5 mg/dL — ABNORMAL LOW (ref 6–20)
CO2: 24 mmol/L (ref 22–32)
Calcium: 9.2 mg/dL (ref 8.9–10.3)
Chloride: 94 mmol/L — ABNORMAL LOW (ref 98–111)
Creatinine, Ser: 0.56 mg/dL — ABNORMAL LOW (ref 0.61–1.24)
GFR calc Af Amer: >60 mL/min (ref >60)
GFR calc non Af Amer: >60 mL/min (ref >60)
Glucose, Bld: 130 mg/dL — ABNORMAL HIGH (ref 70–99)
Potassium: 2.9 mmol/L — ABNORMAL LOW (ref 3.5–5.1)
Sodium: 132 mmol/L — ABNORMAL LOW (ref 135–145)
Total Bilirubin: 2.2 mg/dL — ABNORMAL HIGH (ref 0.3–1.2)
Total Protein: 6.6 g/dL (ref 6.5–8.1)

## 2018-10-22 LAB — MAGNESIUM: Magnesium: 1.5 mg/dL — ABNORMAL LOW (ref 1.7–2.4)

## 2018-10-22 MED ORDER — POTASSIUM CHLORIDE CRYS ER 20 MEQ PO TBCR
40.0000 meq | EXTENDED_RELEASE_TABLET | ORAL | Status: AC
  Administered 2018-10-22 (×2): 40 meq via ORAL
  Filled 2018-10-22 (×2): qty 2

## 2018-10-22 MED ORDER — MAGNESIUM SULFATE 2 GM/50ML IV SOLN
2.0000 g | Freq: Once | INTRAVENOUS | Status: AC
  Administered 2018-10-22: 2 g via INTRAVENOUS
  Filled 2018-10-22: qty 50

## 2018-10-22 MED ORDER — THIAMINE HCL 100 MG PO TABS: 100.0000 mg | ORAL_TABLET | Freq: Every day | ORAL | 0 refills | Status: DC

## 2018-10-22 MED ORDER — ONDANSETRON HCL 4 MG PO TABS: 4.0000 mg | ORAL_TABLET | Freq: Four times a day (QID) | ORAL | 0 refills | Status: DC | PRN

## 2018-10-22 MED ORDER — PANTOPRAZOLE SODIUM 40 MG PO TBEC: 40.0000 mg | DELAYED_RELEASE_TABLET | Freq: Every day | ORAL | 0 refills | Status: DC

## 2018-10-22 MED ORDER — METOPROLOL TARTRATE 50 MG PO TABS: 50.0000 mg | ORAL_TABLET | Freq: Two times a day (BID) | ORAL | 0 refills | Status: DC

## 2018-10-22 MED ORDER — TRAMADOL HCL 50 MG PO TABS: 50.0000 mg | ORAL_TABLET | Freq: Four times a day (QID) | ORAL | 0 refills | Status: DC | PRN

## 2018-10-22 MED ORDER — LISINOPRIL 40 MG PO TABS: 40.0000 mg | ORAL_TABLET | Freq: Every day | ORAL | 0 refills | Status: DC

## 2018-10-22 MED ORDER — FOLIC ACID 1 MG PO TABS: 1.0000 mg | ORAL_TABLET | Freq: Every day | ORAL | 0 refills | Status: DC

## 2018-10-22 MED ORDER — ALPRAZOLAM 0.5 MG PO TABS: 0.5000 mg | ORAL_TABLET | Freq: Every day | ORAL | Status: DC | PRN

## 2018-10-22 NOTE — Progress Notes (Signed)
DISCHARGE NOTE  [name] [name] to be discharged Home per MD order. Patient verbalized understanding.  Skin clean, dry and intact without evidence of skin break down, no evidence of skin tears noted. IV catheter discontinued intact. Site without signs and symptoms of complications. Dressing and pressure applied. Pt denies pain at the site currently. No complaints noted.  Patient free of lines, drains, and wounds.   Discharge packet assembled. An After Visit Summary (AVS) was printed and given to the patient. Patient escorted via wheelchair and discharged to home via private auto.  Babs Sciara, RN

## 2018-10-22 NOTE — Discharge Summary (Signed)
Physician Discharge Summary  [name] [name] XBJ:[id number] DOB: 16-Apr-1975 DOA: 10/20/2018  PCP: Denita Lung, MD  Admit date: 10/20/2018 Discharge date: 10/22/2018  Admitted From: Home Disposition: Home  Recommendations for Outpatient Follow-up:  1. Follow up with PCP in 1 week with repeat CBC/CMP 2. Follow up in ED if symptoms worsen or new appear 3. Abstain from alcohol   Home Health: No Equipment/Devices: None  Discharge Condition: Stable CODE STATUS: Full Diet recommendation: Heart healthy  Brief/Interim Summary: 43 year old male with history of neurosyphilis in 2016, CAD status post DES, systolic CHF with EF of 45- 50% in 3/16; hypertension, alcohol dependence, hyperlipidemia, depression and anxiety presented with abdominal pain.  CT of the abdomen showed probable pancreatitis without pseudocyst or necrosis with gastritis and duodenitis.  Lipase was 170.  He was started on IV fluids.  During hospitalization, his condition improved.  He was started on clear liquid diet and diet was gradually advanced.  He will be put on soft diet today.  His abdominal pain has much improved.  If he tolerates soft diet today, he will be discharged home.  His blood pressure has remained elevated.  His home antihypertensive regimen has been adjusted.  He will need to follow-up with his PCP regarding further antihypertensive dose changes.  Discharge Diagnoses:  Alcoholic pancreatitis -Presented with abdominal pain, lipase of 170 and abnormal CT of the abdomen showing probable pancreatitis without pseudocyst or necrosis with gastritis and duodenitis -Treated with intravenous fluids.  Initially was n.p.o. but subsequently started on clear liquid diet yesterday morning and advance to full liquid diet yesterday evening. -His abdominal pain has much improved.  If he tolerates soft diet today, he will be discharged home.  -Treated with intravenous Protonix as well.  We will switch to oral Protonix on  discharge.   -Alcohol abuse  -No signs of withdrawal right now.    Currently on CIWA protocol.  Continue folic acid, multivitamin and thiamine on discharge. -Counseled about alcohol abstinence.  Thrombocytopenia -Probably from alcohol abuse.  No signs of bleeding.    Outpatient follow-up.  Hypokalemia -Replace prior to discharge.  Outpatient follow-up  Hypomagnesemia -Replace prior to discharge.  Hypertension -Blood pressure is extremely uncontrolled. -Lisinopril has been increased to 40 mg daily and metoprolol has been increased to 50 mg twice a day.  Outpatient follow-up with PCP regarding further dose changes and/or addition of new antihypertensives.  Hyperlipidemia  -continue Lipitor  High risk sexual behavior -Patient has male partners and history of neurosyphilis -Continue Descovy.  Outpatient follow-up   Discharge Instructions  Discharge Instructions    Diet - low sodium heart healthy   Complete by: As directed    Increase activity slowly   Complete by: As directed      Allergies as of 10/22/2018      Reactions   Gadolinium Derivatives Nausea Only   MRI dye      Medication List    TAKE these medications   ALPRAZolam 0.5 MG tablet Commonly known as: XANAX Take 1 tablet (0.5 mg total) by mouth daily as needed for anxiety. What changed: See the new instructions.   aspirin 81 MG EC tablet Take 1 tablet (81 mg total) by mouth daily.   atorvastatin 40 MG tablet Commonly known as: LIPITOR TAKE ONE TABLET BY MOUTH DAILY AT 6 PM. What changed:   how much to take  how to take this  when to take this  additional instructions   Descovy 200-25 MG tablet Generic drug: emtricitabine-tenofovir AF  Take 1 tablet by mouth daily.   folic acid 1 MG tablet Commonly known as: FOLVITE Take 1 tablet (1 mg total) by mouth daily. Start taking on: October 23, 2018   lisinopril 40 MG tablet Commonly known as: ZESTRIL Take 1 tablet (40 mg total) by mouth  daily. Start taking on: October 23, 2018 What changed:   medication strength  how much to take   metoprolol tartrate 50 MG tablet Commonly known as: LOPRESSOR Take 1 tablet (50 mg total) by mouth 2 (two) times daily. What changed:   medication strength  how much to take   multivitamin with minerals Tabs tablet Take 1 tablet by mouth daily.   nicotine 21 mg/24hr patch Commonly known as: NICODERM CQ - dosed in mg/24 hours Place 21 mg onto the skin daily.   ondansetron 4 MG tablet Commonly known as: ZOFRAN Take 1 tablet (4 mg total) by mouth every 6 (six) hours as needed for nausea.   pantoprazole 40 MG tablet Commonly known as: Protonix Take 1 tablet (40 mg total) by mouth daily.   thiamine 100 MG tablet Take 1 tablet (100 mg total) by mouth daily. Start taking on: October 23, 2018   traMADol 50 MG tablet Commonly known as: Ultram Take 1 tablet (50 mg total) by mouth every 6 (six) hours as needed for moderate pain.      Follow-up Information    Denita Lung, MD. Schedule an appointment as soon as possible for a visit in 1 week(s).   Specialty: Family Medicine Why: With repeat CBC/CMP Contact information: [address] [place] [address] [phone]          Allergies  Allergen Reactions  . Gadolinium Derivatives Nausea Only    MRI dye    Consultations:  None   Procedures/Studies: Ct Abdomen Pelvis W Contrast  Result Date: 10/20/2018 CLINICAL DATA:  Abdominal pain and constipation. Concern for pancreatitis. EXAM: CT ABDOMEN AND PELVIS WITH CONTRAST TECHNIQUE: Multidetector CT imaging of the abdomen and pelvis was performed using the standard protocol following bolus administration of intravenous contrast. CONTRAST:  192m OMNIPAQUE IOHEXOL 300 MG/ML  SOLN COMPARISON:  None. FINDINGS: Lower chest: There is slight bibasilar atelectasis. Lung bases elsewhere are clear. Note that there is fluid in the distal esophagus, likely due to  spontaneous gastroesophageal reflux. There is thickening of the wall of the distal esophagus. Hepatobiliary: There is hepatic steatosis. No focal liver lesions are demonstrable. Gallbladder wall is not appreciably thickened. There is no biliary duct dilatation. Pancreas: There is subtle edema of the pancreas in a generalized manner. There is normal enhancement the pancreas without necrosis. There is slight fluid surrounding the head and uncinate process of the pancreas. No fluid collection or pseudocyst evident. No pancreatic calcification. No appreciable pancreatic duct dilatation. Spleen: No splenic lesions are evident. Adrenals/Urinary Tract: Adrenals appear normal bilaterally. Kidneys bilaterally show no evident mass or hydronephrosis on either side. There is no evident renal or ureteral calculus on either side. Urinary bladder is midline with wall thickness within normal limits. Stomach/Bowel: There is moderate air in fluid material in the stomach. There is thickening of the wall of the gastric antrum and pylorus. There is wall thickening in the first and second portions of the duodenum. No ulceration or fistula evident in these areas. There is wall thickening in the region of the third portion of the duodenum as it abuts the uncinate process, likely secondary to the nearby pancreatitis. Elsewhere, there is no other bowel wall thickening.  There is no evident bowel obstruction. Terminal ileum appears unremarkable. There is no free air or portal venous air. Vascular/Lymphatic: There are scattered foci of aortic and iliac artery atherosclerosis. No aneurysm evident. No adenopathy evident in the abdomen or pelvis. Reproductive: Prostate and seminal vesicles appear normal in size and contour. There is no evident pelvic mass. Other: The appendix appears normal. No evident abscess or ascites in the abdomen or pelvis. There is fat in the right inguinal ring. Musculoskeletal: There is degenerative change at L5-S1 with  vacuum phenomenon at this level. There are no blastic or lytic bone lesions. There is no appreciable intramuscular or abdominal wall lesion. IMPRESSION: 1. Pancreas is mildly edematous throughout its contour. Slight fluid is adjacent to the head and uncinate process of the pancreas. No well-defined fluid collection or pseudocyst. No pancreatic duct dilatation. No pancreatic calcification. No evident pancreatic necrosis. 2. Wall thickening in the gastric antrum consistent with a degree of gastritis. There is wall thickening in the proximal duodenum consistent with duodenitis. Wall thickening in the mid duodenum, particularly involving the proximal third portion of the duodenum, is likely due to adjacent pancreatitis. No gastric or duodenal ulceration is seen by CT. No fistula evident. 3. Apparent gastroesophageal reflux into the distal esophagus with wall thickening in the distal esophagus consistent with esophagitis. 4. No bowel obstruction. No abscess in the abdomen or pelvis. Appendix appears normal. 5. No evident renal or ureteral calculus. No hydronephrosis. Urinary bladder wall thickness normal. 6.  Hepatic steatosis. Electronically Signed   By: Lowella Grip III M.D.   On: 10/20/2018 10:57   Dg Abd Acute W/chest  Result Date: 10/18/2018 CLINICAL DATA:  Abdominal pain, vomiting and distention EXAM: DG ABDOMEN ACUTE W/ 1V CHEST COMPARISON:  11/10/2017 chest radiograph. FINDINGS: Stable cardiomediastinal silhouette with normal heart size. No pneumothorax. No pleural effusion. Lungs appear clear, with no acute consolidative airspace disease and no pulmonary edema. Fluid level in the stomach. No disproportionately dilated small bowel loops. Mild stool and moderate gas in the large bowel. No evidence of pneumatosis or pneumoperitoneum. No radiopaque nephrolithiasis. IMPRESSION: 1. No active cardiopulmonary disease. 2. Nonobstructive bowel gas pattern. Nonspecific fluid level in the stomach. Moderate gas and  mild stool in the colon. Electronically Signed   By: Ilona Sorrel M.D.   On: 10/18/2018 15:39       Subjective: Patient seen and examined at bedside.  Denies any overnight fever or vomiting.  Denies worsening abdominal pain.  Thinks that he should be able to go home if he eats okay today.  Discharge Exam: Vitals:   10/22/18 0501 10/22/18 0902  BP: (!) 175/114 (!) 172/109  Pulse: 90 (!) 103  Resp:  18  Temp: 99.1 F (37.3 C) 98.8 F (37.1 C)  SpO2: 100% 99%    General: Pt is alert, awake, not in acute distress Cardiovascular: Intermittently tachycardic, S1/S2 + Respiratory: bilateral decreased breath sounds at bases Abdominal: Soft, NT, ND, bowel sounds + Extremities: no edema, no cyanosis    The results of significant diagnostics from this hospitalization (including imaging, microbiology, ancillary and laboratory) are listed below for reference.     Microbiology: Recent Results (from the past 240 hour(s))  SARS Coronavirus 2 [facility] order, Performed in [facility] hospital lab)     Status: None   Collection Time: 10/20/18  1:33 PM  Result Value Ref Range Status   SARS Coronavirus 2 NEGATIVE NEGATIVE Final    Comment: (NOTE) If result is NEGATIVE SARS-CoV-2 target nucleic acids  are NOT DETECTED. The SARS-CoV-2 RNA is generally detectable in upper and lower  respiratory specimens during the acute phase of infection. The lowest  concentration of SARS-CoV-2 viral copies this assay can detect is 250  copies / mL. A negative result does not preclude SARS-CoV-2 infection  and should not be used as the sole basis for treatment or other  patient management decisions.  A negative result may occur with  improper specimen collection / handling, submission of specimen other  than nasopharyngeal swab, presence of viral mutation(s) within the  areas targeted by this assay, and inadequate number of viral copies  (<250 copies / mL). A negative result must be combined with clinical   observations, patient history, and epidemiological information. If result is POSITIVE SARS-CoV-2 target nucleic acids are DETECTED. The SARS-CoV-2 RNA is generally detectable in upper and lower  respiratory specimens dur ing the acute phase of infection.  Positive  results are indicative of active infection with SARS-CoV-2.  Clinical  correlation with patient history and other diagnostic information is  necessary to determine patient infection status.  Positive results do  not rule out bacterial infection or co-infection with other viruses. If result is PRESUMPTIVE POSTIVE SARS-CoV-2 nucleic acids MAY BE PRESENT.   A presumptive positive result was obtained on the submitted specimen  and confirmed on repeat testing.  While 2019 novel coronavirus  (SARS-CoV-2) nucleic acids may be present in the submitted sample  additional confirmatory testing may be necessary for epidemiological  and / or clinical management purposes  to differentiate between  SARS-CoV-2 and other Sarbecovirus currently known to infect humans.  If clinically indicated additional testing with an alternate test  methodology [phone]) is advised. The SARS-CoV-2 RNA is generally  detectable in upper and lower respiratory sp ecimens during the acute  phase of infection. The expected result is Negative. Fact Sheet for Patients:  StrictlyIdeas.no Fact Sheet for Healthcare Providers: BankingDealers.co.za This test is not yet approved or cleared by the [place] FDA and has been authorized for detection and/or diagnosis of SARS-CoV-2 by FDA under an Emergency Use Authorization (EUA).  This EUA will remain in effect (meaning this test can be used) for the duration of the COVID-19 declaration under Section 564(b)(1) of the Act, 21 U.S.C. section 360bbb-3(b)(1), unless the authorization is terminated or revoked sooner. Performed at [facility], [place] [address]  [place], [place] [address]      Labs: BNP (last 3 results) No results for input(s): BNP in the last 8760 hours. Basic Metabolic Panel: Recent Labs  Lab 10/20/18 0729 10/21/18 0530 10/22/18 0628  NA 131* 132* 132*  K 3.2* 2.6* 2.9*  CL 88* 91* 94*  CO2 26 25 24   GLUCOSE 126* 123* 130*  BUN 5* <5* <5*  CREATININE 0.73 0.62 0.56*  CALCIUM 9.2 8.9 9.2  MG  --  1.5* 1.5*   Liver Function Tests: Recent Labs  Lab 10/20/18 0729 10/21/18 0530 10/22/18 0628  AST 35 26 27  ALT 20 16 15   ALKPHOS 94 74 69  BILITOT 2.0* 2.1* 2.2*  PROT 7.6 6.7 6.6  ALBUMIN 3.9 3.3* 3.3*   Recent Labs  Lab 10/20/18 0729  LIPASE 170*   No results for input(s): AMMONIA in the last 168 hours. CBC: Recent Labs  Lab 10/20/18 0729 10/21/18 0530 10/22/18 0628  WBC 9.6 7.7 7.0  NEUTROABS  --   --  4.7  HGB 17.1* 15.5 15.2  HCT 48.3 42.7 42.5  MCV 97.0 95.5 95.9  PLT 82* 58* 58*   Cardiac Enzymes: No results for input(s): CKTOTAL, CKMB, CKMBINDEX, TROPONINI in the last 168 hours. BNP: Invalid input(s): POCBNP CBG: No results for input(s): GLUCAP in the last 168 hours. D-Dimer No results for input(s): DDIMER in the last 72 hours. Hgb A1c No results for input(s): HGBA1C in the last 72 hours. Lipid Profile No results for input(s): CHOL, HDL, LDLCALC, TRIG, CHOLHDL, LDLDIRECT in the last 72 hours. Thyroid function studies No results for input(s): TSH, T4TOTAL, T3FREE, THYROIDAB in the last 72 hours.  Invalid input(s): FREET3 Anemia work up No results for input(s): VITAMINB12, FOLATE, FERRITIN, TIBC, IRON, RETICCTPCT in the last 72 hours. Urinalysis    Component Value Date/Time   COLORURINE AMBER (A) 04/20/2014 0605   APPEARANCEUR CLEAR 04/20/2014 0605   LABSPEC 1.037 (H) 04/20/2014 0605   PHURINE 5.5 04/20/2014 0605   GLUCOSEU NEGATIVE 04/20/2014 0605   HGBUR NEGATIVE 04/20/2014 0605   BILIRUBINUR moderate (A) 10/27/2017 1654   BILIRUBINUR NEG 05/08/2014 1011   KETONESUR negative  10/27/2017 1654   KETONESUR NEGATIVE 04/20/2014 0605   PROTEINUR =100 (A) 10/27/2017 1654   PROTEINUR NEG 05/08/2014 1011   PROTEINUR NEGATIVE 04/20/2014 0605   UROBILINOGEN 0.2 05/08/2014 1011   UROBILINOGEN 1.0 04/20/2014 0605   NITRITE Negative 10/27/2017 1654   NITRITE NEG 05/08/2014 1011   NITRITE NEGATIVE 04/20/2014 0605   LEUKOCYTESUR Negative 10/27/2017 1654   Sepsis Labs Invalid input(s): PROCALCITONIN,  WBC,  LACTICIDVEN Microbiology Recent Results (from the past 240 hour(s))  SARS Coronavirus 2 [facility] order, Performed in [place] hospital lab)     Status: None   Collection Time: 10/20/18  1:33 PM  Result Value Ref Range Status   SARS Coronavirus 2 NEGATIVE NEGATIVE Final    Comment: (NOTE) If result is NEGATIVE SARS-CoV-2 target nucleic acids are NOT DETECTED. The SARS-CoV-2 RNA is generally detectable in upper and lower  respiratory specimens during the acute phase of infection. The lowest  concentration of SARS-CoV-2 viral copies this assay can detect is 250  copies / mL. A negative result does not preclude SARS-CoV-2 infection  and should not be used as the sole basis for treatment or other  patient management decisions.  A negative result may occur with  improper specimen collection / handling, submission of specimen other  than nasopharyngeal swab, presence of viral mutation(s) within the  areas targeted by this assay, and inadequate number of viral copies  (<250 copies / mL). A negative result must be combined with clinical  observations, patient history, and epidemiological information. If result is POSITIVE SARS-CoV-2 target nucleic acids are DETECTED. The SARS-CoV-2 RNA is generally detectable in upper and lower  respiratory specimens dur ing the acute phase of infection.  Positive  results are indicative of active infection with SARS-CoV-2.  Clinical  correlation with patient history and other diagnostic information is  necessary to determine  patient infection status.  Positive results do  not rule out bacterial infection or co-infection with other viruses. If result is PRESUMPTIVE POSTIVE SARS-CoV-2 nucleic acids MAY BE PRESENT.   A presumptive positive result was obtained on the submitted specimen  and confirmed on repeat testing.  While 2019 novel coronavirus  (SARS-CoV-2) nucleic acids may be present in the submitted sample  additional confirmatory testing may be necessary for epidemiological  and / or clinical management purposes  to differentiate between  SARS-CoV-2 and other Sarbecovirus currently known to infect humans.  If clinically indicated additional testing with an alternate test  methodology (  [id number]) is advised. The SARS-CoV-2 RNA is generally  detectable in upper and lower respiratory sp ecimens during the acute  phase of infection. The expected result is Negative. Fact Sheet for Patients:  StrictlyIdeas.no Fact Sheet for Healthcare Providers: BankingDealers.co.za This test is not yet approved or cleared by the [place] FDA and has been authorized for detection and/or diagnosis of SARS-CoV-2 by FDA under an Emergency Use Authorization (EUA).  This EUA will remain in effect (meaning this test can be used) for the duration of the COVID-19 declaration under Section 564(b)(1) of the Act, 21 U.S.C. section 360bbb-3(b)(1), unless the authorization is terminated or revoked sooner. Performed at [facility], [place] [address], [place], [place] [address]      Time coordinating discharge: 35 minutes  SIGNED:   Aline August, MD  [facility] 10/22/2018, 10:41 AM

## 2018-10-25 ENCOUNTER — Telehealth: Payer: Self-pay

## 2018-10-25 NOTE — Telephone Encounter (Signed)
LVM for pt to call back to complete covid screening questions [facility]

## 2018-10-31 ENCOUNTER — Encounter: Payer: Managed Care, Other (non HMO) | Admitting: Family Medicine

## 2018-11-01 ENCOUNTER — Encounter: Payer: Self-pay | Admitting: Family Medicine

## 2019-04-01 ENCOUNTER — Telehealth (HOSPITAL_COMMUNITY): Payer: Self-pay

## 2019-04-01 ENCOUNTER — Encounter (HOSPITAL_COMMUNITY): Payer: Self-pay

## 2019-04-01 ENCOUNTER — Other Ambulatory Visit: Payer: Self-pay

## 2019-04-01 ENCOUNTER — Ambulatory Visit (HOSPITAL_COMMUNITY)
Admission: EM | Admit: 2019-04-01 | Discharge: 2019-04-01 | Disposition: A | Discharge status: Home / Self Care | Payer: PRIVATE HEALTH INSURANCE | Attending: Family Medicine | Admitting: Family Medicine

## 2019-04-01 DIAGNOSIS — M546 Pain in thoracic spine: Secondary | ICD-10-CM

## 2019-04-01 MED ORDER — LISINOPRIL 40 MG PO TABS: 40.0000 mg | ORAL_TABLET | Freq: Every day | ORAL | 0 refills | Status: DC

## 2019-04-01 MED ORDER — CLONIDINE HCL 0.1 MG PO TABS
ORAL_TABLET | ORAL | Status: AC
  Filled 2019-04-01: qty 1

## 2019-04-01 MED ORDER — KETOROLAC TROMETHAMINE 60 MG/2ML IM SOLN
INTRAMUSCULAR | Status: AC
  Filled 2019-04-01: qty 2

## 2019-04-01 MED ORDER — MELOXICAM 7.5 MG PO TABS: 7.5000 mg | ORAL_TABLET | Freq: Every day | ORAL | 1 refills | Status: DC

## 2019-04-01 MED ORDER — KETOROLAC TROMETHAMINE 60 MG/2ML IM SOLN
60.0000 mg | Freq: Once | INTRAMUSCULAR | Status: AC
  Administered 2019-04-01: 60 mg via INTRAMUSCULAR

## 2019-04-01 MED ORDER — CYCLOBENZAPRINE HCL 10 MG PO TABS: 10.0000 mg | ORAL_TABLET | Freq: Two times a day (BID) | ORAL | 0 refills | Status: DC | PRN

## 2019-04-01 MED ORDER — CLONIDINE HCL 0.1 MG PO TABS
0.1000 mg | ORAL_TABLET | Freq: Once | ORAL | Status: AC
  Administered 2019-04-01: 0.1 mg via ORAL

## 2019-04-01 NOTE — Telephone Encounter (Signed)
[place] [facility] called to clarify instructions for Lisinopril Rx. Spoke with Dr. Sabra Heck and confirmed pt to take 0.5 tablet (3m) each day and then be re-evaluated.

## 2019-04-01 NOTE — ED Triage Notes (Signed)
Pt c/o broad back pain with increased area of pain middle b/t scapula x4 days. Denies recent trauma. Pt states chronic issue since early January. Taking tylenol w/o improvement. Pt also c/o  HA posterior area radiating to eyes. Back increases with movement. Denies CP, dysuria or other sx.

## 2019-04-01 NOTE — ED Provider Notes (Signed)
[place]    CSN: [id number] Arrival date & time: 04/01/19  1127      History   Chief Complaint Chief Complaint  Patient presents with  . Back Pain    HPI Greysyn Vanderberg is a 44 y.o. male.   Patient complains of pain in his upper back between scapula.  No known injury but he has had these symptoms previously.  There is no low back pain no radiation to legs or arms.  He has tried some Tylenol without relief.  Also blood pressure noted to be markedly elevated today.  There seems to be some problem with obtaining medicine as well as compliance.  He does complain of some headache  HPI  Past Medical History:  Diagnosis Date  . Anxiety   . Coronary artery disease    a. 05/2014 OOH MI/PCI: LM nl, LAD 40p, 99p (3.0x28 Synergy DES) w/ R->L Collaterals, LCX nl, RCA 30p/d, EF 55% w/ sev distal inf/ant HK.  . Depression   . Erectile dysfunction   . H/O echocardiogram    a. 05/2014 Echo: Ef 45-50%, sev mid antsept/ant HK.  Marland Kitchen Hyperlipidemia   . Hypertension    medication in 2012, stress as a big component  . 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    Marland Kitchen Pancreatitis 10/2018  . Tobacco abuse     Patient Active Problem List   Diagnosis Date Noted  . Acute pancreatitis 10/21/2018  . 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 Chest pain 11/10/2017  . Alcohol dependence with uncomplicated withdrawal ([place]) 10/27/2017  . Food allergy 12/24/2015  . Hyperlipidemia   . Hypertension   . CAD (coronary artery disease), native coronary artery 05/24/2014  . Family history of premature CAD 05/08/2014  . History of syphilis   . High risk sexual behavior   . Tobacco use disorder     Past Surgical History:  Procedure Laterality Date  . CORONARY STENT PLACEMENT  05/24/2014   MID LAD DES   . LEFT HEART CATHETERIZATION WITH CORONARY ANGIOGRAM N/A 05/24/2014   Procedure: LEFT HEART CATHETERIZATION WITH CORONARY ANGIOGRAM;  Surgeon: Jettie Booze, MD;  Location: [facility] CATH  LAB;  Service: Cardiovascular;  Laterality: N/A;  . PERCUTANEOUS CORONARY STENT INTERVENTION (PCI-S)  05/24/2014   Procedure: PERCUTANEOUS CORONARY STENT INTERVENTION (PCI-S);  Surgeon: Jettie Booze, MD;  Location: [facility] CATH LAB;  Service: Cardiovascular;;  . PILONIDAL CYST EXCISION  1995       Home Medications    Prior to Admission medications   Medication Sig Start Date End Date Taking? Authorizing Provider  aspirin EC 81 MG EC tablet Take 1 tablet (81 mg total) by mouth daily. 05/25/14  Yes Theora Gianotti, NP  atorvastatin (LIPITOR) 40 MG tablet TAKE ONE TABLET BY MOUTH DAILY AT 6 PM. Patient taking differently: Take 40 mg by mouth every morning.  10/27/17  Yes Denita Lung, MD  emtricitabine-tenofovir AF (DESCOVY) 200-25 MG tablet Take 1 tablet by mouth daily.   Yes [provider]  folic acid (FOLVITE) 1 MG tablet Take 1 tablet (1 mg total) by mouth daily. 10/23/18  Yes Aline August, MD  lisinopril (ZESTRIL) 40 MG tablet Take 1 tablet (40 mg total) by mouth daily. 10/23/18  Yes Aline August, MD  metoprolol tartrate (LOPRESSOR) 50 MG tablet Take 1 tablet (50 mg total) by mouth 2 (two) times daily. 10/22/18  Yes Aline August, MD  ALPRAZolam Duanne Moron) 0.5 MG tablet Take 1 tablet (0.5 mg total) by mouth daily  as needed for anxiety. 10/22/18   Aline August, MD  Multiple Vitamin (MULTIVITAMIN WITH MINERALS) TABS tablet Take 1 tablet by mouth daily.    [provider]  nicotine (NICODERM CQ - DOSED IN MG/24 HOURS) 21 mg/24hr patch Place 21 mg onto the skin daily.    [provider]  ondansetron (ZOFRAN) 4 MG tablet Take 1 tablet (4 mg total) by mouth every 6 (six) hours as needed for nausea. 10/22/18   Aline August, MD  pantoprazole (PROTONIX) 40 MG tablet Take 1 tablet (40 mg total) by mouth daily. 10/22/18 11/21/18  Aline August, MD  thiamine 100 MG tablet Take 1 tablet (100 mg total) by mouth daily. 10/23/18   Aline August, MD  traMADol  (ULTRAM) 50 MG tablet Take 1 tablet (50 mg total) by mouth every 6 (six) hours as needed for moderate pain. 10/22/18 10/22/19  Aline August, MD    Family History Family History  Problem Relation Age of Onset  . Thyroid disease Mother   . Heart disease Father 12       MI, stents  . Peripheral Artery Disease Father   . Diabetes type II Father   . Diabetes Father   . Diabetes Maternal Grandmother   . Hypertension Maternal Grandfather   . Hypertension Paternal Grandfather   . Cancer Neg Hx   . Stroke Neg Hx     Social History Social History   Tobacco Use  . Smoking status: Current Every Day Smoker    Packs/day: 1.00    Years: 23.00    Pack years: 23.00    Types: Cigarettes  . Smokeless tobacco: Never Used  . Tobacco comment: currently smoking 0.5 ppd, declines patch  Substance Use Topics  . Alcohol use: Yes    Alcohol/week: 24.0 standard drinks    Types: 24 Cans of beer per week    Comment: 1/5-3/4 [place], 4-5 times a week.  +Guilty, cutting down; 2/4 CAGE questions.  . Drug use: No     Allergies   Gadolinium derivatives   Review of Systems Review of Systems  Musculoskeletal: Positive for back pain.  Neurological: Positive for headaches.  All other systems reviewed and are negative.    Physical Exam Triage Vital Signs ED Triage Vitals  Enc Vitals Group     BP 04/01/19 1204 (!) 195/124     Pulse Rate 04/01/19 1204 91     Resp 04/01/19 1204 16     Temp 04/01/19 1204 98.3 F (36.8 C)     Temp Source 04/01/19 1204 Oral     SpO2 04/01/19 1204 100 %     Weight --      Height --      Head Circumference --      Peak Flow --      Pain Score 04/01/19 1201 8     Pain Loc --      Pain Edu? --      Excl. in [place]? --    No data found.  Updated Vital Signs BP (!) 193/121 (BP Location: Right Arm)   Pulse 91   Temp 98.3 F (36.8 C) (Oral)   Resp 16   SpO2 100%   Visual Acuity Right Eye Distance:   Left Eye Distance:   Bilateral Distance:    Right Eye  Near:   Left Eye Near:    Bilateral Near:     Physical Exam   UC Treatments / Results  Labs (all labs ordered are listed, but only  abnormal results are displayed) Labs Reviewed - No data to display  EKG   Radiology No results found.  Procedures Procedures (including critical care time)  Medications Ordered in UC Medications  cloNIDine (CATAPRES) tablet 0.1 mg (has no administration in time range)  ketorolac (TORADOL) injection 60 mg (has no administration in time range)    Initial Impression / Assessment and Plan / UC Course  I have reviewed the triage vital signs and the nursing notes.  Pertinent labs & imaging results that were available during my care of the patient were reviewed by me and considered in my medical decision making (see chart for details).     Thoracic back pain Final Clinical Impressions(s) / UC Diagnoses   Final diagnoses:  None   Discharge Instructions   None    ED Prescriptions    None     PDMP not reviewed this encounter.   Wardell Honour, MD 04/01/19 1309

## 2019-05-18 ENCOUNTER — Other Ambulatory Visit: Payer: Self-pay | Admitting: Family Medicine

## 2019-05-18 DIAGNOSIS — F418 Other specified anxiety disorders: Secondary | ICD-10-CM

## 2019-05-18 DIAGNOSIS — E785 Hyperlipidemia, unspecified: Secondary | ICD-10-CM

## 2019-05-18 NOTE — Telephone Encounter (Signed)
[facility] is requesting to fill pt xanax. Please advise [facility]

## 2019-05-25 ENCOUNTER — Other Ambulatory Visit: Payer: Self-pay

## 2019-05-25 ENCOUNTER — Encounter: Payer: PRIVATE HEALTH INSURANCE | Admitting: Family Medicine

## 2019-05-29 ENCOUNTER — Encounter: Payer: PRIVATE HEALTH INSURANCE | Admitting: Family Medicine

## 2019-05-30 ENCOUNTER — Ambulatory Visit (INDEPENDENT_AMBULATORY_CARE_PROVIDER_SITE_OTHER): Payer: PRIVATE HEALTH INSURANCE | Admitting: Family Medicine

## 2019-05-30 ENCOUNTER — Encounter: Payer: Self-pay | Admitting: Family Medicine

## 2019-05-30 ENCOUNTER — Other Ambulatory Visit: Payer: Self-pay

## 2019-05-30 VITALS — BP 150/94 | HR 110 | Temp 97.7°F | Wt 137.2 lb

## 2019-05-30 DIAGNOSIS — I2510 Atherosclerotic heart disease of native coronary artery without angina pectoris: Secondary | ICD-10-CM

## 2019-05-30 DIAGNOSIS — Z7252 High risk homosexual behavior: Secondary | ICD-10-CM

## 2019-05-30 DIAGNOSIS — M546 Pain in thoracic spine: Secondary | ICD-10-CM

## 2019-05-30 DIAGNOSIS — I10 Essential (primary) hypertension: Secondary | ICD-10-CM | POA: Diagnosis not present

## 2019-05-30 DIAGNOSIS — F419 Anxiety disorder, unspecified: Secondary | ICD-10-CM | POA: Insufficient documentation

## 2019-05-30 DIAGNOSIS — F418 Other specified anxiety disorders: Secondary | ICD-10-CM

## 2019-05-30 DIAGNOSIS — Z8719 Personal history of other diseases of the digestive system: Secondary | ICD-10-CM | POA: Insufficient documentation

## 2019-05-30 DIAGNOSIS — E785 Hyperlipidemia, unspecified: Secondary | ICD-10-CM | POA: Diagnosis not present

## 2019-05-30 LAB — LIPID PANEL
Chol/HDL Ratio: 2.4 ratio (ref 0.0–5.0)
Cholesterol, Total: 178 mg/dL (ref 100–199)
HDL: 73 mg/dL (ref >39)
LDL Chol Calc (NIH): 92 mg/dL (ref 0–99)
Triglycerides: 69 mg/dL (ref 0–149)
VLDL Cholesterol Cal: 13 mg/dL (ref 5–40)

## 2019-05-30 LAB — COMPREHENSIVE METABOLIC PANEL
ALT: 9 IU/L (ref 0–44)
AST: 18 IU/L (ref 0–40)
Albumin/Globulin Ratio: 1.4 (ref 1.2–2.2)
Albumin: 4 g/dL (ref 4.0–5.0)
Alkaline Phosphatase: 99 IU/L (ref 39–117)
BUN/Creatinine Ratio: 17 (ref 9–20)
BUN: 9 mg/dL (ref 6–24)
Bilirubin Total: 0.8 mg/dL (ref 0.0–1.2)
CO2: 24 mmol/L (ref 20–29)
Calcium: 9 mg/dL (ref 8.7–10.2)
Chloride: 96 mmol/L (ref 96–106)
Creatinine, Ser: 0.53 mg/dL — ABNORMAL LOW (ref 0.76–1.27)
GFR calc Af Amer: 148 mL/min/{1.73_m2} (ref >59)
GFR calc non Af Amer: 128 mL/min/{1.73_m2} (ref >59)
Globulin, Total: 2.9 g/dL (ref 1.5–4.5)
Glucose: 130 mg/dL — ABNORMAL HIGH (ref 65–99)
Potassium: 4.6 mmol/L (ref 3.5–5.2)
Sodium: 137 mmol/L (ref 134–144)
Total Protein: 6.9 g/dL (ref 6.0–8.5)

## 2019-05-30 LAB — CBC WITH DIFFERENTIAL/PLATELET
Basophils Absolute: 0 10*3/uL (ref 0.0–0.2)
Basos: 0 %
EOS (ABSOLUTE): 0 10*3/uL (ref 0.0–0.4)
Eos: 0 %
Hematocrit: 42.7 % (ref 37.5–51.0)
Hemoglobin: 14.9 g/dL (ref 13.0–17.7)
Immature Grans (Abs): 0.1 10*3/uL (ref 0.0–0.1)
Immature Granulocytes: 1 %
Lymphocytes Absolute: 1.2 10*3/uL (ref 0.7–3.1)
Lymphs: 10 %
MCH: 33.5 pg — ABNORMAL HIGH (ref 26.6–33.0)
MCHC: 34.9 g/dL (ref 31.5–35.7)
MCV: 96 fL (ref 79–97)
Monocytes Absolute: 0.9 10*3/uL (ref 0.1–0.9)
Monocytes: 7 %
Neutrophils Absolute: 10.1 10*3/uL — ABNORMAL HIGH (ref 1.4–7.0)
Neutrophils: 82 %
Platelets: 228 10*3/uL (ref 150–450)
RBC: 4.45 x10E6/uL (ref 4.14–5.80)
RDW: 14.9 % (ref 11.6–15.4)
WBC: 12.3 10*3/uL — ABNORMAL HIGH (ref 3.4–10.8)

## 2019-05-30 MED ORDER — ATORVASTATIN CALCIUM 40 MG PO TABS: ORAL_TABLET | ORAL | 1 refills | Status: DC

## 2019-05-30 MED ORDER — LISINOPRIL-HYDROCHLOROTHIAZIDE 20-12.5 MG PO TABS: 1.0000 | ORAL_TABLET | Freq: Every day | ORAL | 3 refills | Status: DC

## 2019-05-30 MED ORDER — ALPRAZOLAM 0.5 MG PO TABS: 0.5000 mg | ORAL_TABLET | Freq: Every day | ORAL | 0 refills | Status: DC | PRN

## 2019-05-30 MED ORDER — METOPROLOL TARTRATE 50 MG PO TABS: 50.0000 mg | ORAL_TABLET | Freq: Two times a day (BID) | ORAL | 0 refills | Status: DC

## 2019-05-30 MED ORDER — KETOROLAC TROMETHAMINE 60 MG/2ML IM SOLN
60.0000 mg | Freq: Once | INTRAMUSCULAR | Status: AC
  Administered 2019-05-30: 60 mg via INTRAMUSCULAR

## 2019-05-30 NOTE — Addendum Note (Signed)
Addended by: Denita Lung on: 05/30/2019 10:28 AM   Modules accepted: Level of Service

## 2019-05-30 NOTE — Progress Notes (Addendum)
   Subjective:    Patient ID: [name] [name], male    DOB: 1976/02/07, 44 y.o.   MRN: [id number]  HPI He is here mainly because he is having midthoracic back pain.  He has a several year history of intermittent difficulty with this.  He was seen within the last couple months in the emergency room for this.  He states that the Toradol really helped with that.  He also has had Zickel therapy and apparently had dry needling which also helped.  The pain is made worse with breathing as well as motion.  No history of injury. He is now going to the health department for his [place] and getting blood work done for that.  He has not seen cardiology in several years.  His previous cardiologist is now retired.  He continues on his statin and is having no difficulty with that.  His lisinopril was recently increased to help with his blood pressure.  He would like his Xanax renewed.  He does use this very sparingly according to the record.  He also has a history of alcohol induced hepatitis.  He states that he has cut back extensively on his alcohol consumption.   Review of Systems     Objective:   Physical Exam Alert and in no distress.  Cardiac exam shows regular rhythm without murmurs or gallops.  Lungs are clear to auscultation.  Exam of his upper back shows no palpable point tenderness.       Assessment & Plan:  Essential hypertension - Plan: metoprolol tartrate (LOPRESSOR) 50 MG tablet, lisinopril-hydrochlorothiazide (ZESTORETIC) 20-12.5 MG tablet, CBC with Differential/Platelet, Comprehensive metabolic panel  Hyperlipidemia, unspecified hyperlipidemia type - Plan: atorvastatin (LIPITOR) 40 MG tablet, Lipid panel  Situational anxiety - Plan: ALPRAZolam (XANAX) 0.5 MG tablet  Coronary artery disease involving native coronary artery of native heart, angina presence unspecified - Plan: metoprolol tartrate (LOPRESSOR) 50 MG tablet, Ambulatory referral to Cardiology  History of pancreatitis  High  risk homosexual behavior  Midline thoracic back pain, unspecified chronicity - Plan: Ambulatory referral to Physical Therapy, ketorolac (TORADOL) injection 60 mg  He will be referred back to physical therapy which seems to work the best.  He will continue to go to the health department for his prep. Briefly discussed the alcohol consumption with him and he states that he has this under better control. I did increase his lisinopril to lisinopril/HCTZ.  He will return here in 1 month or have this followed up by cardiology.

## 2019-05-31 ENCOUNTER — Other Ambulatory Visit: Payer: Self-pay | Admitting: Family Medicine

## 2019-06-04 LAB — SPECIMEN STATUS REPORT

## 2019-06-04 LAB — HGB A1C W/O EAG: Hgb A1c MFr Bld: 6.3 % — ABNORMAL HIGH (ref 4.8–5.6)

## 2019-06-05 ENCOUNTER — Encounter: Payer: Self-pay | Admitting: Physical Therapy

## 2019-06-05 ENCOUNTER — Ambulatory Visit: Payer: PRIVATE HEALTH INSURANCE | Attending: Family Medicine | Admitting: Physical Therapy

## 2019-06-05 ENCOUNTER — Other Ambulatory Visit: Payer: Self-pay

## 2019-06-05 ENCOUNTER — Telehealth: Payer: Self-pay | Admitting: Internal Medicine

## 2019-06-05 DIAGNOSIS — M62838 Other muscle spasm: Secondary | ICD-10-CM | POA: Insufficient documentation

## 2019-06-05 DIAGNOSIS — M542 Cervicalgia: Secondary | ICD-10-CM | POA: Diagnosis present

## 2019-06-05 NOTE — Telephone Encounter (Signed)
Pt has upcoming lab appt but no future orders

## 2019-06-05 NOTE — Therapy (Signed)
[place], [place], [address] Phone: [phone]   Fax:  [phone]  Physical Therapy Evaluation  Patient Details  Name: [name] [name] MRN: [id number] Date of Birth: 1975-05-13 Referring Provider (PT): Dr Jill Alexanders    Encounter Date: 06/05/2019  PT End of Session - 06/05/19 1434    Visit Number  1    Number of Visits  12    Date for PT Re-Evaluation  07/17/19    Authorization Type  AMbetter    PT Start Time  1415    PT Stop Time  1500    PT Time Calculation (min)  45 min    Activity Tolerance  Patient tolerated treatment well    Behavior During Therapy  [facility] for tasks assessed/performed       Past Medical History:  Diagnosis Date  . Anxiety   . Coronary artery disease    a. 05/2014 OOH MI/PCI: LM nl, LAD 40p, 99p (3.0x28 Synergy DES) w/ R->L Collaterals, LCX nl, RCA 30p/d, EF 55% w/ sev distal inf/ant HK.  . Depression   . Erectile dysfunction   . H/O echocardiogram    a. 05/2014 Echo: Ef 45-50%, sev mid antsept/ant HK.  Marland Kitchen Hyperlipidemia   . Hypertension    medication in 2012, stress as a big component  . 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    Marland Kitchen Pancreatitis 10/2018  . Tobacco abuse     Past Surgical History:  Procedure Laterality Date  . CORONARY STENT PLACEMENT  05/24/2014   MID LAD DES   . LEFT HEART CATHETERIZATION WITH CORONARY ANGIOGRAM N/A 05/24/2014   Procedure: LEFT HEART CATHETERIZATION WITH CORONARY ANGIOGRAM;  Surgeon: Jettie Booze, MD;  Location: [facility] CATH LAB;  Service: Cardiovascular;  Laterality: N/A;  . PERCUTANEOUS CORONARY STENT INTERVENTION (PCI-S)  05/24/2014   Procedure: PERCUTANEOUS CORONARY STENT INTERVENTION (PCI-S);  Surgeon: Jettie Booze, MD;  Location: [facility] CATH LAB;  Service: Cardiovascular;;  . PILONIDAL CYST EXCISION  1995    There were no vitals filed for this visit.   Subjective Assessment - 06/05/19 1420    Subjective   Patient had an incidious onset of left sided neck and upper trap pain. He has a history of this kind of pain in the past. He has had physical therapy in the past which has helped.    Pertinent History  anxiety, past neck spasm, depression    Limitations  Lifting    How long can you sit comfortably?  N/A    How long can you stand comfortably?  N/A    How long can you walk comfortably?  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  Yes    Pain Score  4     Pain Location  Neck    Pain Orientation  Left    Pain Descriptors / Indicators  Aching    Pain Type  Chronic pain    Pain Onset  More than a month ago    Pain Frequency  Intermittent    Aggravating Factors   movement of the arm    Pain Relieving Factors  nothing    Effect of Pain on Daily Activities  Pain with activity         [facility] PT Assessment - 06/05/19 0001      Assessment   Medical Diagnosis  Left  Upper Trap Spasm    Referring Provider (PT)  Dr Jill Alexanders     Onset Date/Surgical Date  --   >3 months    Hand Dominance  Left    Next MD Visit  Nothing scheduled     Prior Therapy  A few years ago for similar problem       Precautions   Precautions  None      Restrictions   Weight Bearing Restrictions  No      Balance Screen   Has the patient fallen in the past 6 months  No    Has the patient had a decrease in activity level because of a fear of falling?   No    Is the patient reluctant to leave their home because of a fear of falling?   No      Home Environment   Additional Comments  nothing pertinant       Prior Function   Level of Independence  Independent    Vocation  Full time employment    Vocation Requirements  Delivery     Leisure  Nothing really      Cognition   Overall Cognitive Status  Within Functional Limits for tasks assessed    Attention  Focused    Focused Attention  Appears intact    Memory  Appears intact     Awareness  Appears intact    Problem Solving  Appears intact      Sensation   Light Touch  Appears Intact    Additional Comments  denies parathesias       Coordination   Gross Motor Movements are Fluid and Coordinated  Yes    Fine Motor Movements are Fluid and Coordinated  Yes      ROM / Strength   AROM / PROM / Strength  AROM;PROM;Strength      AROM   Overall AROM Comments  full active ROm of the left shoulder     AROM Assessment Site  Shoulder;Cervical    Cervical Flexion  30    Cervical Extension  30    Cervical - Right Rotation  50    Cervical - Left Rotation  70      Strength   Overall Strength Comments  5/5 gross Left shoulder strength left grip 45lbs right 50 lbs       Palpation   Palpation comment  Spasming of the left upper trap                 Objective measurements completed on examination: See above findings.      [facility] Adult PT Treatment/Exercise - 06/05/19 0001      Manual Therapy   Manual Therapy  Joint mobilization;Soft tissue mobilization    Joint Mobilization  PA's to upper and mid throacic spine     Soft tissue mobilization  to upper trap and left periscapular area       Neck Exercises: Stretches   Upper Trapezius Stretch  2 reps;20 seconds    Levator Stretch  2 reps;20 seconds    Other Neck Stretches  reviewed use of the thera-cane        Trigger Point Dry Needling - 06/05/19 0001    Consent Given?  Yes    Education Handout Provided  Yes    Muscles Treated Head and Neck  Upper trapezius;Levator scapulae    Upper Trapezius Response  Twitch reponse elicited;Palpable increased muscle length    Levator Scapulae Response  Twitch response  elicited;Palpable increased muscle length             PT Short Term Goals - 06/05/19 1519      PT SHORT TERM GOAL #1   Title  Patient will increase right cerivcal rotation by 15 degrees    Time  3    Period  Weeks    Status  New    Target Date  06/26/19      PT SHORT TERM GOAL #2   Title   Patient will be independent with basic HEP    Time  3    Period  Weeks    Status  New    Target Date  06/26/19      PT SHORT TERM GOAL #3   Title  Patient will report a 50% reduction in pain and spasming of the upper trap    Time  3    Period  Weeks    Status  New    Target Date  06/26/19        PT Long Term Goals - 06/05/19 1521      PT LONG TERM GOAL #1   Title  Patient will report no pain while driving    Time  6    Period  Weeks    Status  New    Target Date  07/17/19      PT LONG TERM GOAL #2   Title  Patient will sleep throught the night without pain    Time  6    Period  Weeks    Status  New    Target Date  07/17/19             Plan - 06/05/19 1435    Clinical Impression Statement  Patient is a 44 year old male with an acute onset of left upper trap pain. he h has had similar pain in the past. He has mild limitations in cervical ROM. he has full strength and motion of his left UE. He would benefit from skilled therapy to decrease his pain and develope a plan in the futre to decrease spasming if this comes about again.    Personal Factors and Comorbidities  Comorbidity 1;Comorbidity 2    Comorbidities  anxiety, depression    Examination-Activity Limitations  Lift;Locomotion Level;Carry    Stability/Clinical Decision Making  Stable/Uncomplicated    Clinical Decision Making  Low    Rehab Potential  Poor    PT Frequency  1x / week    PT Duration  6 weeks    PT Treatment/Interventions  ADLs/Self Care Home Management;Cryotherapy;Iontophoresis 49m/ml Dexamethasone;Electrical Stimulation;Moist Heat;Traction;Therapeutic activities;Therapeutic exercise;Neuromuscular re-education;Patient/family education;Manual techniques;Passive range of motion;Dry needling;Taping    PT Next Visit Plan  continue with TPDN and manual therapy to upper trap and peri-scapular area; add scapular strengthening,    PT Home Exercise Plan  upper trap stretch, levator stretch, tennis ball  trigger point release, reviewed thera-cane use and where to putrchase    Consulted and Agree with Plan of Care  Patient       Patient will benefit from skilled therapeutic intervention in order to improve the following deficits and impairments:  Improper body mechanics, Decreased activity tolerance, Pain, Postural dysfunction, Impaired UE functional use, Decreased range of motion  Visit Diagnosis: Other muscle spasm  Cervicalgia     Problem List Patient Active Problem List   Diagnosis Date Noted  . History of pancreatitis 05/30/2019  . Situational anxiety 05/30/2019  . Chest pain 11/10/2017  . Alcohol  dependence with uncomplicated withdrawal ([place]) 10/27/2017  . Food allergy 12/24/2015  . Hyperlipidemia   . Hypertension   . CAD (coronary artery disease), native coronary artery 05/24/2014  . Family history of premature CAD 05/08/2014  . History of syphilis   . High risk sexual behavior   . Tobacco use disorder     Carney Living PT DPT  06/05/2019, 4:23 PM  [facility] [address] [place], [place], [address] Phone: [phone]   Fax:  [phone]  Name: Christoher Drudge MRN: [id number] Date of Birth: 1975-11-25

## 2019-06-06 NOTE — Telephone Encounter (Signed)
He had blood work in March

## 2019-06-06 NOTE — Telephone Encounter (Signed)
Pt's appt was canceled.

## 2019-06-12 ENCOUNTER — Ambulatory Visit: Payer: PRIVATE HEALTH INSURANCE | Admitting: Physical Therapy

## 2019-06-12 ENCOUNTER — Telehealth: Payer: Self-pay | Admitting: Internal Medicine

## 2019-06-12 NOTE — Telephone Encounter (Signed)
Left message for pt to call to schedule CPE

## 2019-06-19 ENCOUNTER — Telehealth (INDEPENDENT_AMBULATORY_CARE_PROVIDER_SITE_OTHER): Payer: PRIVATE HEALTH INSURANCE | Admitting: Internal Medicine

## 2019-06-19 DIAGNOSIS — E785 Hyperlipidemia, unspecified: Secondary | ICD-10-CM

## 2019-06-19 DIAGNOSIS — I10 Essential (primary) hypertension: Secondary | ICD-10-CM | POA: Diagnosis not present

## 2019-06-19 DIAGNOSIS — I2510 Atherosclerotic heart disease of native coronary artery without angina pectoris: Secondary | ICD-10-CM

## 2019-06-19 MED ORDER — LISINOPRIL-HYDROCHLOROTHIAZIDE 20-12.5 MG PO TABS: 2.0000 | ORAL_TABLET | Freq: Every day | ORAL | 3 refills | Status: DC

## 2019-06-19 NOTE — Patient Instructions (Addendum)
,   this does not need to be sent to the pharmacy yet.  Medication Instructions:  Increase lisinopril HCTZ to 40-25 mg. You may double up on current prescription *If you need a refill on your cardiac medications before your next appointment, please call your pharmacy*  Testing/Procedures: Echocardiogram - Your physician has requested that you have an echocardiogram. Echocardiography is a painless test that uses sound waves to create images of your heart. It provides your doctor with information about the size and shape of your heart and how well your heart's chambers and valves are working. This procedure takes approximately one hour. There are no restrictions for this procedure. This will be performed at our [facility] location - [address].  Follow-Up: Your next appointment:  AFTER ECHO  In Person with You may see Elouise Munroe, MD or one of the following Advanced Practice Providers on your designated Care Team:  Rosaria Ferries, PA-C  Jory Sims, DNP, ANP  Cadence Kathlen Mody, NP  At [facility], you and your health needs are our priority.  As part of our continuing mission to provide you with exceptional heart care, we have created designated Provider Care Teams.  These Care Teams include your primary Cardiologist (physician) and Advanced Practice Providers (APPs -  Physician Assistants and Nurse Practitioners) who all work together to provide you with the care you need, when you need it.  We recommend signing up for the patient portal called "MyChart".  Sign up information is provided on this After Visit Summary.  MyChart is used to connect with patients for Virtual Visits (Telemedicine).  Patients are able to view lab/test results, encounter notes, upcoming appointments, etc.  Non-urgent messages can be sent to your provider as well.   To learn more about what you can do with MyChart, go to [email or web address].

## 2019-06-19 NOTE — Progress Notes (Signed)
Virtual Visit via Telephone Note   This visit type was conducted due to national recommendations for restrictions regarding the COVID-19 Pandemic (e.g. social distancing) in an effort to limit this patient's exposure and mitigate transmission in our community.  Due to his co-morbid illnesses, this patient is at least at moderate risk for complications without adequate follow up.  This format is felt to be most appropriate for this patient at this time.  The patient did not have access to video technology/had technical difficulties with video requiring transitioning to audio format only (telephone).  All issues noted in this document were discussed and addressed.  No physical exam could be performed with this format.  Please refer to the patient's chart for his  consent to telehealth for [facility]   The patient was identified using 2 identifiers.  Date:  06/19/2019   ID:  [name] [name], DOB 08-18-1975, MRN [id number]  Patient Location: Home Provider Location: Office  PCP:  Denita Lung, MD  Cardiologist:  Elouise Munroe, MD  Electrophysiologist:  None   Evaluation Performed:  New Patient Evaluation  Chief Complaint: Reestablish cardiovascular care, hypertension, history of MI  History of Present Illness:    [name] [name] is a 44 y.o. 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 He was found to have a subtotal LAD which had occluded at the time of his intervention and he had a stent placed.  He also has a history of depression, hyperlipidemia, hypertension, prior neurosyphilis, pancreatitis with alcohol use, tobacco abuse.  [facility] [place] prescription. There he was noted to have a BP of 172/101. This concerned him and given his need for continued cardiovascular care an appointment in our office was arranged.   Increase zestoretic 40-25 mg  Bring to office - ekg  Echocardiogram for anterior mi  The  patient does not have symptoms concerning for COVID-19 infection (fever, chills, cough, or new shortness of breath).    Past Medical History:  Diagnosis Date  . Anxiety   . Coronary artery disease    a. 05/2014 OOH MI/PCI: LM nl, LAD 40p, 99p (3.0x28 Synergy DES) w/ R->L Collaterals, LCX nl, RCA 30p/d, EF 55% w/ sev distal inf/ant HK.  . Depression   . Erectile dysfunction   . H/O echocardiogram    a. 05/2014 Echo: Ef 45-50%, sev mid antsept/ant HK.  Marland Kitchen Hyperlipidemia   . Hypertension    medication in 2012, stress as a big component  . 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    Marland Kitchen Pancreatitis 10/2018  . Tobacco abuse    Past Surgical History:  Procedure Laterality Date  . CORONARY STENT PLACEMENT  05/24/2014   MID LAD DES   . LEFT HEART CATHETERIZATION WITH CORONARY ANGIOGRAM N/A 05/24/2014   Procedure: LEFT HEART CATHETERIZATION WITH CORONARY ANGIOGRAM;  Surgeon: Jettie Booze, MD;  Location: [facility] CATH LAB;  Service: Cardiovascular;  Laterality: N/A;  . PERCUTANEOUS CORONARY STENT INTERVENTION (PCI-S)  05/24/2014   Procedure: PERCUTANEOUS CORONARY STENT INTERVENTION (PCI-S);  Surgeon: Jettie Booze, MD;  Location: [facility] CATH LAB;  Service: Cardiovascular;;  . PILONIDAL CYST EXCISION  1995     Current Meds  Medication Sig  . ALPRAZolam (XANAX) 0.5 MG tablet Take 1 tablet (0.5 mg total) by mouth daily as needed for anxiety.  Marland Kitchen aspirin EC 81 MG EC tablet Take 1 tablet (81 mg total) by mouth daily.  Marland Kitchen atorvastatin (LIPITOR) 40  MG tablet TAKE ONE TABLET BY MOUTH DAILY AT 6PM  . emtricitabine-tenofovir AF (DESCOVY) 200-25 MG tablet Take 1 tablet by mouth daily.  Marland Kitchen lisinopril-hydrochlorothiazide (ZESTORETIC) 20-12.5 MG tablet Take 1 tablet by mouth daily.  . metoprolol tartrate (LOPRESSOR) 50 MG tablet Take 1 tablet (50 mg total) by mouth 2 (two) times daily.     Allergies:   Gadolinium derivatives   Social History   Tobacco Use  . Smoking status: Current Every  Day Smoker    Packs/day: 1.00    Years: 23.00    Pack years: 23.00    Types: Cigarettes  . Smokeless tobacco: Never Used  . Tobacco comment: currently smoking 0.5 ppd, declines patch  Substance Use Topics  . Alcohol use: Yes    Alcohol/week: 24.0 standard drinks    Types: 24 Cans of beer per week    Comment: 1/5-3/4 Fifth, 4-5 times a week.  +Guilty, cutting down; 2/4 CAGE questions.  . Drug use: No     Family Hx: The patient's family history includes Diabetes in his father and maternal grandmother; Diabetes type II in his father; Heart disease (age of onset: 51) in his father; Hypertension in his maternal grandfather and paternal grandfather; Peripheral Artery Disease in his father; Thyroid disease in his mother. There is no history of Cancer or Stroke.  ROS:   Please see the history of present illness.     All other systems reviewed and are negative.   Prior CV studies:   The following studies were reviewed today:    Labs/Other Tests and Data Reviewed:    EKG:  An ECG dated 10/23/2018 was personally reviewed today and demonstrated:  NSR, left axis deviation, anterior infarct  Recent Labs: 10/22/2018: Magnesium 1.5 05/30/2019: ALT 9; BUN 9; Creatinine, Ser 0.53; Hemoglobin 14.9; Platelets 228; Potassium 4.6; Sodium 137   Recent Lipid Panel Lab Results  Component Value Date/Time   CHOL 178 05/30/2019 09:35 AM   TRIG 69 05/30/2019 09:35 AM   HDL 73 05/30/2019 09:35 AM   CHOLHDL 2.4 05/30/2019 09:35 AM   CHOLHDL 1.7 11/01/2016 01:48 PM   LDLCALC 92 05/30/2019 09:35 AM    Wt Readings from Last 3 Encounters:  05/30/19 137 lb 3.2 oz (62.2 kg)  10/22/18 149 lb 0.5 oz (67.6 kg)  11/24/17 146 lb 3.2 oz (66.3 kg)     Objective:    Vital Signs:  There were no vitals taken for this visit.   VITAL SIGNS:  reviewed GEN:  no acute distress RESPIRATORY:  normal respiratory effort, no increased work of breathing NEURO:  alert and oriented x 3, speech normal PSYCH:  normal  affect   ASSESSMENT & PLAN:    Coronary artery disease involving native coronary artery of native heart, angina presence unspecified -  He has previously had a reduced ejection fraction and LAD territory WMA. We will repeat an echo to reevaluate EF and WM.   Plan: ECHOCARDIOGRAM COMPLETE  Essential hypertension - continue metoprolol. Increase dose of lisinopril HCTZ to 40-25 mg until follow up and reevaluate.   Hyperlipidemia - continue statin therapy, atorvastatin 40 mg daily.    COVID-19 Education: The signs and symptoms of COVID-19 were discussed with the patient and how to seek care for testing (follow up with PCP or arrange E-visit).  The importance of social distancing was discussed today.  Time:   Today, I have spent 20 minutes with the patient with telehealth technology discussing the above problems.     Medication Adjustments/Labs  and Tests Ordered: Current medicines are reviewed at length with the patient today.  Concerns regarding medicines are outlined above.   Tests Ordered: No orders of the defined types were placed in this encounter.   Medication Changes: No orders of the defined types were placed in this encounter.   Follow Up:  After echocardiogram, in office for ECG  Signed, Elouise Munroe, MD  06/19/2019 9:13 AM    [place]

## 2019-06-20 ENCOUNTER — Encounter: Payer: Self-pay | Admitting: Physical Therapy

## 2019-06-20 ENCOUNTER — Ambulatory Visit: Payer: PRIVATE HEALTH INSURANCE | Attending: Family Medicine | Admitting: Physical Therapy

## 2019-06-20 ENCOUNTER — Other Ambulatory Visit: Payer: Self-pay

## 2019-06-20 DIAGNOSIS — M62838 Other muscle spasm: Secondary | ICD-10-CM

## 2019-06-20 DIAGNOSIS — M542 Cervicalgia: Secondary | ICD-10-CM | POA: Insufficient documentation

## 2019-06-21 ENCOUNTER — Encounter: Payer: Self-pay | Admitting: Physical Therapy

## 2019-06-21 NOTE — Therapy (Signed)
[place], [place], [address] Phone: [phone]   Fax:  [phone]  Physical Therapy Treatment/Discharge   Patient Details  Name: [name] [name] MRN: [id number] Date of Birth: 01-07-1976 Referring Provider (PT): Dr Jill Alexanders    Encounter Date: 06/20/2019  PT End of Session - 06/21/19 0946    Visit Number  2    Number of Visits  12    Date for PT Re-Evaluation  07/17/19    Authorization Type  [employer]    PT Start Time  1330    PT Stop Time  1400   D/C visit. Was independent with all activity   PT Time Calculation (min)  30 min    Activity Tolerance  Patient tolerated treatment well    Behavior During Therapy  WFL for tasks assessed/performed       Past Medical History:  Diagnosis Date  . Anxiety   . Coronary artery disease    a. 05/2014 OOH MI/PCI: LM nl, LAD 40p, 99p (3.0x28 Synergy DES) w/ R->L Collaterals, LCX nl, RCA 30p/d, EF 55% w/ sev distal inf/ant HK.  . Depression   . Erectile dysfunction   . H/O echocardiogram    a. 05/2014 Echo: Ef 45-50%, sev mid antsept/ant HK.  Marland Kitchen Hyperlipidemia   . Hypertension    medication in 2012, stress as a big component  . 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    Marland Kitchen Pancreatitis 10/2018  . Tobacco abuse     Past Surgical History:  Procedure Laterality Date  . CORONARY STENT PLACEMENT  05/24/2014   MID LAD DES   . LEFT HEART CATHETERIZATION WITH CORONARY ANGIOGRAM N/A 05/24/2014   Procedure: LEFT HEART CATHETERIZATION WITH CORONARY ANGIOGRAM;  Surgeon: Jettie Booze, MD;  Location: [facility] CATH LAB;  Service: Cardiovascular;  Laterality: N/A;  . PERCUTANEOUS CORONARY STENT INTERVENTION (PCI-S)  05/24/2014   Procedure: PERCUTANEOUS CORONARY STENT INTERVENTION (PCI-S);  Surgeon: Jettie Booze, MD;  Location: [facility] CATH LAB;  Service: Cardiovascular;;  . PILONIDAL CYST EXCISION  1995    There were no vitals filed for this  visit.  Subjective Assessment - 06/20/19 1628    Subjective  Patient is doing much better. He feels like he is ready for D/C    Pertinent History  anxiety, past neck spasm, depression    Limitations  Lifting    How long can you sit comfortably?  N/A    How long can you stand comfortably?  N/A    How long can you walk comfortably?  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  No/denies                       [facility] Adult PT Treatment/Exercise - 06/21/19 0001      Self-Care   Self-Care  Other Self-Care Comments    Other Self-Care Comments   reviewed the improtance of posterior chain strengthening for home.       Manual Therapy   Manual Therapy  Joint mobilization;Soft tissue mobilization    Joint Mobilization  PA's to upper and mid throacic spine     Soft tissue mobilization  to upper trap and left periscapular area       Neck Exercises: Stretches   Upper Trapezius Stretch  2 reps;20 seconds    Levator Stretch  2 reps;20  seconds               PT Short Term Goals - 06/21/19 0951      PT SHORT TERM GOAL #1   Title  Patient will increase right cerivcal rotation by 15 degrees    Baseline  full today    Time  3    Period  Weeks    Status  Achieved    Target Date  06/26/19      PT SHORT TERM GOAL #2   Title  Patient will be independent with basic HEP    Baseline  has HEP    Time  3    Period  Weeks    Status  Achieved    Target Date  06/26/19      PT SHORT TERM GOAL #3   Title  Patient will report a 50% reduction in pain and spasming of the upper trap    Time  3    Period  Weeks    Status  Achieved    Target Date  06/26/19        PT Long Term Goals - 06/21/19 0952      PT LONG TERM GOAL #1   Title  Patient will report no pain while driving    Baseline  no pain since intial visit drving for work    Time  6    Period  Weeks    Status  Achieved      PT LONG TERM  GOAL #2   Title  Patient will sleep throught the night without pain    Baseline  no pain at night    Time  6    Period  Weeks    Status  Achieved            Plan - 06/21/19 0950    Clinical Impression Statement  Patient has reached goals for therapy. He has full cervical range and his pain has decreased significantly. He still had minor trigger points in his peri-scapular area. Therapy reviewed posterior chanin strengthening. He has been working on his stretches at home. He is comfortable with his program. D/C to HEP.    Personal Factors and Comorbidities  Comorbidity 1;Comorbidity 2    Comorbidities  anxiety, depression    Examination-Activity Limitations  Lift;Locomotion Level;Carry    Stability/Clinical Decision Making  Stable/Uncomplicated    Clinical Decision Making  Low    Rehab Potential  Poor    PT Frequency  1x / week    PT Duration  6 weeks    PT Treatment/Interventions  ADLs/Self Care Home Management;Cryotherapy;Iontophoresis 43m/ml Dexamethasone;Electrical Stimulation;Moist Heat;Traction;Therapeutic activities;Therapeutic exercise;Neuromuscular re-education;Patient/family education;Manual techniques;Passive range of motion;Dry needling;Taping    PT Next Visit Plan  continue with TPDN and manual therapy to upper trap and peri-scapular area; add scapular strengthening,    PT Home Exercise Plan  upper trap stretch, levator stretch, tennis ball trigger point release, reviewed thera-cane use and where to putrchase    Consulted and Agree with Plan of Care  Patient       Patient will benefit from skilled therapeutic intervention in order to improve the following deficits and impairments:  Improper body mechanics, Decreased activity tolerance, Pain, Postural dysfunction, Impaired UE functional use, Decreased range of motion  Visit Diagnosis: Other muscle spasm  Cervicalgia   PHYSICAL THERAPY DISCHARGE SUMMARY  Visits from Start of Care: 2  Current functional level  related to goals / functional outcomes: Significantly improved pain    Remaining deficits:  None   Education / Equipment: HEP   Plan: Patient agrees to discharge.  Patient goals were met. Patient is being discharged due to meeting the stated rehab goals.  ?????       Problem List Patient Active Problem List   Diagnosis Date Noted  . History of pancreatitis 05/30/2019  . Situational anxiety 05/30/2019  . Chest pain 11/10/2017  . Alcohol dependence with uncomplicated withdrawal ([place]) 10/27/2017  . Food allergy 12/24/2015  . Hyperlipidemia   . Hypertension   . CAD (coronary artery disease), native coronary artery 05/24/2014  . Family history of premature CAD 05/08/2014  . History of syphilis   . High risk sexual behavior   . Tobacco use disorder     Carney Living PT DPT  06/21/2019, 10:00 AM  [facility] [address] [place], [place], [address] Phone: [phone]   Fax:  [phone]  Name: [name] [name] MRN: [id number] Date of Birth: [birth year]

## 2019-06-27 ENCOUNTER — Encounter: Payer: PRIVATE HEALTH INSURANCE | Admitting: Physical Therapy

## 2019-07-02 ENCOUNTER — Other Ambulatory Visit: Payer: Self-pay

## 2019-07-02 ENCOUNTER — Ambulatory Visit (INDEPENDENT_AMBULATORY_CARE_PROVIDER_SITE_OTHER): Payer: PRIVATE HEALTH INSURANCE | Admitting: Family Medicine

## 2019-07-02 ENCOUNTER — Encounter: Payer: Self-pay | Admitting: Family Medicine

## 2019-07-02 ENCOUNTER — Other Ambulatory Visit: Payer: PRIVATE HEALTH INSURANCE

## 2019-07-02 VITALS — BP 162/98 | HR 72 | Temp 97.5°F | Wt 138.6 lb

## 2019-07-02 DIAGNOSIS — I10 Essential (primary) hypertension: Secondary | ICD-10-CM | POA: Diagnosis not present

## 2019-07-02 DIAGNOSIS — E785 Hyperlipidemia, unspecified: Secondary | ICD-10-CM | POA: Diagnosis not present

## 2019-07-02 DIAGNOSIS — M546 Pain in thoracic spine: Secondary | ICD-10-CM

## 2019-07-02 DIAGNOSIS — I2510 Atherosclerotic heart disease of native coronary artery without angina pectoris: Secondary | ICD-10-CM

## 2019-07-02 DIAGNOSIS — R6882 Decreased libido: Secondary | ICD-10-CM | POA: Diagnosis not present

## 2019-07-02 NOTE — Patient Instructions (Signed)
Double up on the atorvastatin and if you have muscle aches and pains let me know.  If he tolerated let me know so I can increase the dosage with the prescription

## 2019-07-02 NOTE — Progress Notes (Signed)
   Subjective:    Patient ID: [name] [name], male    DOB: 07-Aug-1975, 44 y.o.   MRN: [id number]  HPI He is here for follow-up visit.  He has been getting help with physical therapy for his upper back pain with dry needling and he states the pain is now gone completely.  He is not taking 2 lisinopril 20/12.5 at the request of cardiology.  Review of his record indicates his LDL is at 92.he does have a follow-up appointment with him on May 10.  He also has cut back on his alcohol and does recognize that when he drinks excessively he does start having difficulty with abdominal pain.  He does have a previous history of pancreatitis.  He also has a several month history of decreased libido as well as fatigue and decreased strength.  He is concerned over this being related to low testosterone.   Review of Systems     Objective:   Physical Exam Alert and in no distress otherwise not examined       Assessment & Plan:  Low libido - Plan: Testosterone  Hyperlipidemia, unspecified hyperlipidemia type  Essential hypertension  Coronary artery disease involving native coronary artery of native heart, angina presence unspecified  Midline thoracic back pain, unspecified chronicity I will check a testosterone on him in follow-up based on that.  He is to continue on his Lipitor and we will need to work on getting his LDL much lower.  I discussed options with him in terms of switching to a different medication however cost might be a factor.  He will double the dose of the Lipitor and let me know if he has any muscle aches or pains.  He was comfortable with that.  He will follow-up with cardiology and explained that if his blood pressure is not at goal, another medication will be given.  Follow-up here as needed depending upon the testosterone result. Greater than 30 minutes spent discussing issues with libido and testing for testosterone as well as lowering his cholesterol numbers and treating his blood  pressure.

## 2019-07-03 LAB — TESTOSTERONE: Testosterone: 351 ng/dL (ref 264–916)

## 2019-07-04 ENCOUNTER — Other Ambulatory Visit: Payer: Self-pay

## 2019-07-04 ENCOUNTER — Ambulatory Visit (HOSPITAL_COMMUNITY): Payer: PRIVATE HEALTH INSURANCE | Attending: Cardiovascular Disease

## 2019-07-04 ENCOUNTER — Encounter: Payer: PRIVATE HEALTH INSURANCE | Admitting: Physical Therapy

## 2019-07-04 DIAGNOSIS — I2510 Atherosclerotic heart disease of native coronary artery without angina pectoris: Secondary | ICD-10-CM | POA: Insufficient documentation

## 2019-07-12 NOTE — Progress Notes (Signed)
Cardiology Office Note   Date:  07/16/2019   ID:  [name] [name], DOB Dec 04, 1975, MRN [id number]  PCP:  [name] Lung, MD  Cardiologist:  Dr..Acharya  CC: BP Follow up    History of Present Illness: [name] [name] is a 44 y.o. male who presents for ongoing assessment and management of coronary artery disease with history of anterior infarction in March 2016 with a subtotal LAD which was occluded at the time of intervention and had a stent placed.  Other history includes hypertension, ongoing tobacco abuse, alcohol use, hyperlipidemia, depression, prior neurosyphilis, and pancreatitis.  Was last seen in the office on 06/19/2019 to be reestablished with cardiology.  He is also followed [facility] the health department.  He was noted to be hypertensive at 172/101 there and therefore decided to be reestablished with cardiology.  At the time of the office visit echocardiogram was planned to evaluate his EF and LAD territory wall motion abnormalities.  Due to hypertension he was continued on metoprolol but increase his lisinopril HCTZ to 40/25 mg with follow-up today to evaluate his response to medications and discuss echo results.  Echo dated 07/04/2019 revealed a normal LVEF of 60 to 65%.  There were no wall motion abnormalities.  There is no evidence of diastolic dysfunction.  There were no valvular abnormalities.  He comes today with complaints of generalized fatigue.  He has been followed by his PCP for this.  He unfortunately continues to smoke.  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  He denies any chest pain or pressure.  He is doing well on his medication regimen.  Past Medical History:  Diagnosis Date  . Anxiety   . Coronary artery disease    a. 05/2014 OOH MI/PCI: LM nl, LAD 40p, 99p (3.0x28 Synergy DES) w/ R->L Collaterals, LCX nl, RCA 30p/d, EF 55% w/ sev distal inf/ant HK.  .  Depression   . Erectile dysfunction   . H/O echocardiogram    a. 05/2014 Echo: Ef 45-50%, sev mid antsept/ant HK.  Marland Kitchen Hyperlipidemia   . Hypertension    medication in 2012, stress as a big component  . 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    Marland Kitchen Pancreatitis 10/2018  . Tobacco abuse     Past Surgical History:  Procedure Laterality Date  . CORONARY STENT PLACEMENT  05/24/2014   MID LAD DES   . LEFT HEART CATHETERIZATION WITH CORONARY ANGIOGRAM N/A 05/24/2014   Procedure: LEFT HEART CATHETERIZATION WITH CORONARY ANGIOGRAM;  Surgeon: Jettie Booze, MD;  Location[facility] C[facility] LAB;  Service: Cardiovascular;  Laterality: N/A;  . PERCUTANEOUS CORONARY STENT INTERVENTION (PCI-S)  05/24/2014   Procedure: PERCUTANEOUS CORONARY STENT INTERVENTION (PCI-S);  Surgeon: Jettie Booze, MD;  Location: [facility] CATH LAB;  Service: Cardiovascular;;  . PILONIDAL CYST EXCISION  1995     Current Outpatient Medications  Medication Sig Dispense Refill  . ALPRAZolam (XANAX) 0.5 MG tablet Take 1 tablet (0.5 mg total) by mouth daily as needed for anxiety. 30 tablet 0  . aspirin EC 81 MG EC tablet Take 1 tablet (81 mg total) by mouth daily.    Marland Kitchen atorvastatin (LIPITOR) 40 MG tablet TAKE ONE TABLET BY MOUTH DAILY AT 6PM 90 tablet 1  . emtricitabine-tenofovir AF (DESCOVY) 200-25 MG tablet Take 1 tablet by mouth daily.    Marland Kitchen lisinopril-hydrochlorothiazide (ZESTORETIC) 20-12.5 MG tablet  Take 2 tablets by mouth daily. Rx for 40-25 mg daily 60 tablet 3  . metoprolol tartrate (LOPRESSOR) 50 MG tablet Take 1 tablet (50 mg total) by mouth 2 (two) times daily. 60 tablet 0  . Multiple Vitamin (MULTIVITAMIN WITH MINERALS) TABS tablet Take 1 tablet by mouth daily.    Marland Kitchen albuterol (VENTOLIN HFA) 108 (90 Base) MCG/ACT inhaler Inhale 2 puffs into the lungs every 6 (six) hours as needed for wheezing or shortness of breath. 8 g 0   No current facility-administered medications for this visit.    Allergies:    Gadolinium derivatives    Social History:  The patient  reports that he has been smoking cigarettes. He has a 23.00 pack-year smoking history. He has never used smokeless tobacco. He reports current alcohol use of about 24.0 standard drinks of alcohol per week. He reports that he does not use drugs.   Family History:  The patient's family history includes Diabetes in his father and maternal grandmother; Diabetes type II in his father; Heart disease (age of onset: 17) in his father; Hypertension in his maternal grandfather and paternal grandfather; Peripheral Artery Disease in his father; Thyroid disease in his mother.    ROS: All other systems are reviewed and negative. Unless otherwise mentioned in H&P    PHYSICAL EXAM: VS:  BP 112/76   Pulse 75   Ht 5' 4"  (1.626 m)   Wt 141 lb (64 kg)   BMI 24.20 kg/m  , BMI Body mass index is 24.2 kg/m. GEN: Well nourished, well developed, in no acute distress HEENT: normal Neck: no JVD, carotid bruits, or masses Cardiac: RRR; no murmurs, rubs, or gallops,no edema  Respiratory: Inspiratory expiratory wheezes without cough. GI: soft, nontender, nondistended, + BS MS: no deformity or atrophy Skin: warm and dry, no rash Neuro:  Strength and sensation are intact Psych: euthymic mood, full affect   EKG: Normal sinus rhythm, evidence of old anterior infarct.  Heart rate of 75 bpm Recent Labs: 10/22/2018: Magnesium 1.5 05/30/2019: ALT 9; BUN 9; Creatinine, Ser 0.53; Hemoglobin 14.9; Platelets 228; Potassium 4.6; Sodium 137    Lipid Panel    Component Value Date/Time   CHOL 178 05/30/2019 0935   TRIG 69 05/30/2019 0935   HDL 73 05/30/2019 0935   CHOLHDL 2.4 05/30/2019 0935   CHOLHDL 1.7 11/01/2016 1348   VLDL 12 11/01/2016 1348   LDLCALC 92 05/30/2019 0935      Wt Readings from Last 3 Encounters:  07/16/19 141 lb (64 kg)  07/02/19 138 lb 9.6 oz (62.9 kg)  05/30/19 137 lb 3.2 oz (62.2 kg)      Other studies Reviewed: Echocardiogram  07/04/2019 1. Left ventricular ejection fraction, by estimation, is 60 to 65%. The  left ventricle has normal function. The left ventricle has no regional  wall motion abnormalities. Left ventricular diastolic parameters were  normal.  2. Right ventricular systolic function is normal. The right ventricular  size is normal.  3. The mitral valve is normal in structure. No evidence of mitral valve  regurgitation. No evidence of mitral stenosis.  4. The aortic valve is tricuspid. Aortic valve regurgitation is not  visualized. No aortic stenosis is present.  5. The inferior vena cava is normal in size with greater than 50%  respiratory variability, suggesting right atrial pressure of 3 mmHg.   ASSESSMENT AND PLAN:  1.  Coronary artery disease: History of anterior infarction in March 2016 with a subtotal LAD which was occluded at the  time of the intervention and had a stent placed.  He continues on aspirin daily and statin therapy along with beta-blocker therapy.  May need to consider stopping beta-blocker as he is wheezing so much.  I am going to order a chest x-ray to evaluate his [name] status.  2.  Hyperlipidemia: He continues on atorvastatin 40 mg daily.  He will need to have fasting lipids and LFTs if not completed on next visit.  He is due to see his primary care physician shortly who is also drawing labs.  Goal of LDL less than 70.  3.  Ongoing tobacco abuse: Significant wheezing is heard.  He has no intention of quitting at this time.  I have counseled him on smoking cessation especially in light of CAD and hypertension..  I will give him a Proventil inhaler to assist him with bronchodilation to use as needed but he will need to follow-up with his PCP for further evaluation of his [name] status.  Chest x-ray is being completed.  4.  Hypertension: Much better controlled with change in medication from prior dosing now on lisinopril 40/25 HCTZ.  Continue current regimen.  Current medicines  are reviewed at length with the patient today.  I have spent 25 minutesdedicated to the care of this patient on the date of this encounter to include pre-visit review of records, assessment, management and diagnostic testing,with shared decision making.  Labs/ tests ordered today include: PA and lateral chest x-ray Phill Myron. West Pugh, ANP, [facility]   07/16/2019 10:37 AM    [facility] [place] [address] Office [phone] Fax [phone]  Notice: This dictation was prepared with Dragon dictation along with smaller phrase technology. Any transcriptional errors that result from this process are unintentional and may not be corrected upon review.

## 2019-07-16 ENCOUNTER — Ambulatory Visit (INDEPENDENT_AMBULATORY_CARE_PROVIDER_SITE_OTHER): Payer: PRIVATE HEALTH INSURANCE | Admitting: Adult Health

## 2019-07-16 ENCOUNTER — Other Ambulatory Visit: Payer: Self-pay

## 2019-07-16 ENCOUNTER — Encounter: Payer: Self-pay | Admitting: Adult Health

## 2019-07-16 VITALS — BP 112/76 | HR 75 | Ht 64.0 in | Wt 141.0 lb

## 2019-07-16 DIAGNOSIS — Z720 Tobacco use: Secondary | ICD-10-CM

## 2019-07-16 DIAGNOSIS — I10 Essential (primary) hypertension: Secondary | ICD-10-CM

## 2019-07-16 DIAGNOSIS — I2510 Atherosclerotic heart disease of native coronary artery without angina pectoris: Secondary | ICD-10-CM

## 2019-07-16 DIAGNOSIS — E785 Hyperlipidemia, unspecified: Secondary | ICD-10-CM | POA: Diagnosis not present

## 2019-07-16 DIAGNOSIS — R0602 Shortness of breath: Secondary | ICD-10-CM | POA: Diagnosis not present

## 2019-07-16 MED ORDER — LISINOPRIL-HYDROCHLOROTHIAZIDE 20-12.5 MG PO TABS: 2.0000 | ORAL_TABLET | Freq: Every day | ORAL | 3 refills | Status: DC

## 2019-07-16 MED ORDER — ALBUTEROL SULFATE HFA 108 (90 BASE) MCG/ACT IN AERS
2.0000 | INHALATION_SPRAY | Freq: Four times a day (QID) | RESPIRATORY_TRACT | 0 refills | Status: DC | PRN
Start: 2019-07-16 — End: 2021-07-06

## 2019-07-16 MED ORDER — ATORVASTATIN CALCIUM 40 MG PO TABS: ORAL_TABLET | ORAL | 3 refills | Status: DC

## 2019-07-16 MED ORDER — METOPROLOL TARTRATE 50 MG PO TABS: 50.0000 mg | ORAL_TABLET | Freq: Two times a day (BID) | ORAL | 3 refills | Status: DC

## 2019-07-16 NOTE — Patient Instructions (Signed)
Medication Instructions:  Continue current medications  *If you need a refill on your cardiac medications before your next appointment, please call your pharmacy*   Lab Work: None Ordered  Testing/Procedures: A chest x-ray takes a picture of the organs and structures inside the chest, including the heart, lungs, and blood vessels. This test can show several things, including, whether the heart is enlarges; whether fluid is building up in the lungs; and whether pacemaker / defibrillator leads are still in place.  Follow-Up: At [facility], you and your health needs are our priority.  As part of our continuing mission to provide you with exceptional heart care, we have created designated Provider Care Teams.  These Care Teams include your primary Cardiologist (physician) and Advanced Practice Providers (APPs -  Physician Assistants and Nurse Practitioners) who all work together to provide you with the care you need, when you need it.  We recommend signing up for the patient portal called "MyChart".  Sign up information is provided on this After Visit Summary.  MyChart is used to connect with patients for Virtual Visits (Telemedicine).  Patients are able to view lab/test results, encounter notes, upcoming appointments, etc.  Non-urgent messages can be sent to your provider as well.   To learn more about what you can do with MyChart, go to [email or web address].    Your next appointment:   6 month(s)  The format for your next appointment:   In Person  Provider:   You may see Elouise Munroe, MD or one of the following Advanced Practice Providers on your designated Care Team:    Rosaria Ferries, PA-C  Jory Sims, DNP, ANP  Cadence Kathlen Mody, NP

## 2019-07-17 ENCOUNTER — Telehealth: Payer: Self-pay

## 2019-07-17 NOTE — Telephone Encounter (Signed)
Pt. Called stating you referred him to go to Cardiology. He wasn't sure what the referral was for I gave him the reason on the referral. He said he had a bad experience there and would not prefer to go there anymore. He saw Jory Sims NP and she said that he was HIV positive because she was going over his medications and saw that he was on descovy and he said that he was on that to prevent him form getting HIV. She didn't seem to understand that and he didn't like her very much. He said if you need to refer him to a different Cardiologist that would be fine. He does not wish to go back [facility].

## 2019-07-18 NOTE — Telephone Encounter (Signed)
Pt. Aware to contact the office manager at that office to let her know exactly what happened and also that he can certainly see a different Cardiologists.

## 2019-07-18 NOTE — Telephone Encounter (Signed)
Let him know that we can certainly use a different cardiologist in the future and tell him to send a letter to their office manager concerning her comments

## 2019-07-26 ENCOUNTER — Other Ambulatory Visit: Payer: Self-pay | Admitting: Internal Medicine

## 2019-07-26 NOTE — Telephone Encounter (Signed)
 *  STAT* If patient is at the pharmacy, call can be transferred to refill team.   1. Which medications need to be refilled? (please list name of each medication and dose if known)   lisinopril-hydrochlorothiazide (ZESTORETIC) 20-12.5 MG tablet  2. Which pharmacy/location (including street and city if local pharmacy) is medication to be sent to?   [facility] [address], [place]  3. Do they need a 30 day or 90 day supply? [place]

## 2019-07-30 ENCOUNTER — Other Ambulatory Visit: Payer: Self-pay | Admitting: Internal Medicine

## 2019-07-30 NOTE — Telephone Encounter (Signed)
 *  STAT* If patient is at the pharmacy, call can be transferred to refill team.   1. Which medications need to be refilled? (please list name of each medication and dose if known)   lisinopril-hydrochlorothiazide (ZESTORETIC) 20-12.5 MG tablet  2. Which pharmacy/location (including street and city if local pharmacy) is medication to be sent to? [facility]  3. Do they need a 30 day or 90 day supply? 90  Patient states his pharmacy says they never received prescription. Patient is completely out of meds as of this past Saturday. He also wants to clarify he is to take 20-12.5 mg twice daily and not 40 mg twice daily.

## 2019-07-31 ENCOUNTER — Telehealth: Payer: Self-pay | Admitting: Internal Medicine

## 2019-07-31 DIAGNOSIS — R6882 Decreased libido: Secondary | ICD-10-CM

## 2019-07-31 NOTE — Telephone Encounter (Signed)
Pt has lab appt this Friday but no lab orders in

## 2019-07-31 NOTE — Telephone Encounter (Signed)
Done

## 2019-08-03 ENCOUNTER — Other Ambulatory Visit: Payer: Self-pay

## 2019-08-03 ENCOUNTER — Other Ambulatory Visit: Payer: PRIVATE HEALTH INSURANCE

## 2019-08-03 DIAGNOSIS — R6882 Decreased libido: Secondary | ICD-10-CM

## 2019-08-04 LAB — TESTOSTERONE: Testosterone: 272 ng/dL (ref 264–916)

## 2019-08-04 MED ORDER — TESTOSTERONE 20.25 MG/ACT (1.62%) TD GEL: 2.0000 "application " | Freq: Every day | TRANSDERMAL | 5 refills | Status: DC

## 2019-08-07 ENCOUNTER — Telehealth: Payer: Self-pay

## 2019-08-07 MED ORDER — TESTOSTERONE 20.25 MG/ACT (1.62%) TD GEL: 2.0000 "application " | Freq: Every day | TRANSDERMAL | 1 refills | Status: DC

## 2019-08-07 NOTE — Telephone Encounter (Signed)
Done

## 2019-08-07 NOTE — Telephone Encounter (Signed)
Pt. Called stating that he wanted to know if you could send in a 90 day supply of his testosterone Gel instead of the 30 day supply that was sent in because he has a 60 dollar copay he has to pay each time her picks it up so would be cheaper for a 90 day supply. It also needs a PA done on it as well which I will start on that today or tomorrow.

## 2019-08-27 ENCOUNTER — Telehealth: Payer: Self-pay | Admitting: Family Medicine

## 2019-08-27 NOTE — Telephone Encounter (Signed)
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 Tanuj wants to know what should he do and is it something he needs to be concerned about?

## 2019-08-28 NOTE — Telephone Encounter (Signed)
LVM for pt to call back . Kh

## 2019-08-28 NOTE — Telephone Encounter (Signed)
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

## 2019-08-29 NOTE — Telephone Encounter (Signed)
Pt was advised. Kh

## 2019-10-31 ENCOUNTER — Other Ambulatory Visit: Payer: Self-pay | Admitting: Family Medicine

## 2019-10-31 DIAGNOSIS — F418 Other specified anxiety disorders: Secondary | ICD-10-CM

## 2019-10-31 NOTE — Telephone Encounter (Signed)
[facility] is requesting to fill pt xanax. Please advise [facility]

## 2019-11-15 ENCOUNTER — Ambulatory Visit: Payer: 59 | Admitting: Family Medicine

## 2019-11-15 ENCOUNTER — Other Ambulatory Visit: Payer: Self-pay

## 2019-11-15 VITALS — BP 102/72 | HR 86 | Temp 96.7°F | Wt 123.8 lb

## 2019-11-15 DIAGNOSIS — R7989 Other specified abnormal findings of blood chemistry: Secondary | ICD-10-CM

## 2019-11-15 DIAGNOSIS — Z23 Encounter for immunization: Secondary | ICD-10-CM

## 2019-11-15 DIAGNOSIS — E109 Type 1 diabetes mellitus without complications: Secondary | ICD-10-CM

## 2019-11-15 DIAGNOSIS — R6882 Decreased libido: Secondary | ICD-10-CM

## 2019-11-15 DIAGNOSIS — R739 Hyperglycemia, unspecified: Secondary | ICD-10-CM

## 2019-11-15 LAB — POCT GLYCOSYLATED HEMOGLOBIN (HGB A1C): Hemoglobin A1C: 8.8 % — AB (ref 4.0–5.6)

## 2019-11-15 MED ORDER — METFORMIN HCL 500 MG PO TABS: 500.0000 mg | ORAL_TABLET | Freq: Two times a day (BID) | ORAL | 5 refills | Status: DC

## 2019-11-15 NOTE — Patient Instructions (Signed)

## 2019-11-15 NOTE — Progress Notes (Signed)
   Subjective:    Patient ID: [name] [name], male    DOB: 05-Oct-1975, 44 y.o.   MRN: [id number]  HPI He is here for consult concerning his blood work.  It states elevated liver enzymes are however that is not the case.  He does have a history of elevated blood sugar with a previous hemoglobin A1c of 6.3 in March. He does however complain of decreased libido, fatigue, and decreased energy.  Does have a previous testosterone level that was low and apparently is waiting for testosterone coverage from insurance.   Review of Systems     Objective:   Physical Exam Alert and in no distress.  Hemoglobin A1c is 8.8.       Assessment & Plan:  New onset of diabetes mellitus in pediatric patient ([place])  Low libido - Plan: Testosterone  Low testosterone in male - Plan: Testosterone  Elevated blood sugar - Plan: POCT glycosylated hemoglobin (Hb A1C)  Need for influenza vaccination - Plan: Flu Vaccine QUAD 6+ mos PF IM (Fluarix Quad PF) He now has new onset diabetes.  I discussed this with him in regard to diet, exercise smoking and drinking..  I will start him on Metformin 500 mg twice daily.  Discussed possible side effects of this medication.  Recommend he go to the American diabetes Association website for more information and information given to him in his AVS.  He is to return here in 1 week for further consultation concerning his diabetes. Testosterone level also checked and we will discuss this with his next visit.

## 2019-11-16 LAB — TESTOSTERONE: Testosterone: 302 ng/dL (ref 264–916)

## 2019-11-18 ENCOUNTER — Telehealth: Payer: Self-pay | Admitting: Family Medicine

## 2019-11-18 NOTE — Telephone Encounter (Signed)
P.A. TESTOSTERONE 20.25

## 2019-11-20 NOTE — Telephone Encounter (Signed)
Called plan because wouldn't go thru on Cover my meds T# [phone] they require consultation with endocrinologist in order for testosterone gel to be approved. OR Pt can be changed to testosterone enanthate 267m per ml  which requires P.A. but not consultation with endocrinologist only copies of the labs fax # [phone] I called pt and he is ok with switching to injection.  Can pt be switched to testosterone enanthate 2052mper ml ?

## 2019-11-20 NOTE — Telephone Encounter (Signed)
Go ahead and start him on that but I want to see him approximately 1 week into his injection

## 2019-11-23 MED ORDER — TESTOSTERONE CYPIONATE 200 MG/ML IM SOLN: 200.0000 mg | INTRAMUSCULAR | 0 refills | Status: DC

## 2019-11-23 NOTE — Telephone Encounter (Signed)
Called pharmacy & she didn't have the testosterone enanthate in stock she ran as cypionate and it went thru insurance $13 so okay'd that.  Called pt and informed.  He will pick up and call for injection next week and advised follow up 1 month recheck testosterone.

## 2019-11-28 ENCOUNTER — Other Ambulatory Visit (INDEPENDENT_AMBULATORY_CARE_PROVIDER_SITE_OTHER): Payer: 59

## 2019-11-28 DIAGNOSIS — R7989 Other specified abnormal findings of blood chemistry: Secondary | ICD-10-CM

## 2019-11-28 MED ORDER — TESTOSTERONE CYPIONATE 200 MG/ML IM SOLN
200.0000 mg | Freq: Once | INTRAMUSCULAR | Status: AC
  Administered 2019-11-28: 200 mg via INTRAMUSCULAR

## 2019-11-29 ENCOUNTER — Encounter: Payer: Self-pay | Admitting: Family Medicine

## 2019-11-29 ENCOUNTER — Ambulatory Visit: Payer: 59 | Admitting: Family Medicine

## 2019-11-29 ENCOUNTER — Other Ambulatory Visit: Payer: Self-pay

## 2019-11-29 VITALS — BP 90/54 | HR 106 | Temp 97.3°F | Wt 122.2 lb

## 2019-11-29 DIAGNOSIS — E109 Type 1 diabetes mellitus without complications: Secondary | ICD-10-CM | POA: Diagnosis not present

## 2019-11-29 MED ORDER — ONETOUCH DELICA LANCETS 33G MISC: 1.0000 | Freq: Every day | 3 refills | Status: DC

## 2019-11-29 MED ORDER — ONETOUCH VERIO VI STRP: ORAL_STRIP | 12 refills | Status: DC

## 2019-11-29 NOTE — Progress Notes (Signed)
   Subjective:    Patient ID: Jesstin Studstill, male    DOB: [birth year], 44 y.o.   MRN: [id number]  HPI He is here for a recheck.  He did check with the American diabetes Association website and did get valuable information concerning proper care of his diabetes.  He has had some difficulty with occasional vomiting and abdominal distress and is wondering if it is related to the Metformin.  He has made some diet and exercise changes.  He is here for follow-up and also start checking his blood sugars.   Review of Systems     Objective:   Physical Exam Alert and in no distress otherwise not examined       Assessment & Plan:  New onset of diabetes mellitus in pediatric patient ([place]) - Plan: OneTouch Delica Lancets 64P MISC, glucose blood (ONETOUCH VERIO) test strip He was instructed by me and my CMA on the use of the glucometer.  We also discussed his Metformin dosing and at this point we will take a watchful waiting concerning any GI related symptoms.  He was comfortable with that.  He will start checking his sugars daily before meals or 2 hours after meal.  We will recheck him in about 3 months.

## 2019-11-30 ENCOUNTER — Other Ambulatory Visit: Payer: Self-pay

## 2019-11-30 DIAGNOSIS — E109 Type 1 diabetes mellitus without complications: Secondary | ICD-10-CM

## 2019-11-30 MED ORDER — ONETOUCH VERIO VI STRP: ORAL_STRIP | 12 refills | Status: DC

## 2019-11-30 MED ORDER — ONETOUCH DELICA LANCETS 33G MISC: 1.0000 | Freq: Every day | 3 refills | Status: DC

## 2019-12-02 ENCOUNTER — Encounter: Payer: Self-pay | Admitting: Family Medicine

## 2019-12-07 ENCOUNTER — Other Ambulatory Visit: Payer: Self-pay

## 2019-12-07 ENCOUNTER — Ambulatory Visit (INDEPENDENT_AMBULATORY_CARE_PROVIDER_SITE_OTHER)
Admission: EM | Admit: 2019-12-07 | Discharge: 2019-12-07 | Disposition: A | Discharge status: Home / Self Care | Payer: 59 | Source: Home / Self Care | Attending: Family Medicine | Admitting: Family Medicine

## 2019-12-07 ENCOUNTER — Encounter (HOSPITAL_COMMUNITY): Payer: Self-pay

## 2019-12-07 ENCOUNTER — Telehealth (HOSPITAL_COMMUNITY): Payer: Self-pay | Admitting: Family Medicine

## 2019-12-07 DIAGNOSIS — K859 Acute pancreatitis without necrosis or infection, unspecified: Secondary | ICD-10-CM | POA: Diagnosis not present

## 2019-12-07 DIAGNOSIS — R1012 Left upper quadrant pain: Secondary | ICD-10-CM | POA: Insufficient documentation

## 2019-12-07 DIAGNOSIS — R109 Unspecified abdominal pain: Secondary | ICD-10-CM | POA: Diagnosis not present

## 2019-12-07 DIAGNOSIS — R112 Nausea with vomiting, unspecified: Secondary | ICD-10-CM

## 2019-12-07 HISTORY — DX: Type 2 diabetes mellitus without complications: E11.9

## 2019-12-07 LAB — POCT URINALYSIS DIPSTICK, ED / UC
Bilirubin Urine: NEGATIVE
Glucose, UA: 500 mg/dL — AB
Hgb urine dipstick: NEGATIVE
Ketones, ur: NEGATIVE mg/dL
Leukocytes,Ua: NEGATIVE
Nitrite: NEGATIVE
Protein, ur: NEGATIVE mg/dL
Specific Gravity, Urine: 1.015 (ref 1.005–1.030)
Urobilinogen, UA: 0.2 mg/dL (ref 0.0–1.0)
pH: 7 (ref 5.0–8.0)

## 2019-12-07 LAB — COMPREHENSIVE METABOLIC PANEL
ALT: 14 U/L (ref 0–44)
AST: 16 U/L (ref 15–41)
Albumin: 3.2 g/dL — ABNORMAL LOW (ref 3.5–5.0)
Alkaline Phosphatase: 70 U/L (ref 38–126)
Anion gap: 10 (ref 5–15)
BUN: 6 mg/dL (ref 6–20)
CO2: 28 mmol/L (ref 22–32)
Calcium: 9.7 mg/dL (ref 8.9–10.3)
Chloride: 92 mmol/L — ABNORMAL LOW (ref 98–111)
Creatinine, Ser: 0.77 mg/dL (ref 0.61–1.24)
GFR calc Af Amer: >60 mL/min (ref >60)
GFR calc non Af Amer: >60 mL/min (ref >60)
Glucose, Bld: 274 mg/dL — ABNORMAL HIGH (ref 70–99)
Potassium: 4.1 mmol/L (ref 3.5–5.1)
Sodium: 130 mmol/L — ABNORMAL LOW (ref 135–145)
Total Bilirubin: 0.8 mg/dL (ref 0.3–1.2)
Total Protein: 6.6 g/dL (ref 6.5–8.1)

## 2019-12-07 LAB — CBC
HCT: 43 % (ref 39.0–52.0)
Hemoglobin: 14.8 g/dL (ref 13.0–17.0)
MCH: 32 pg (ref 26.0–34.0)
MCHC: 34.4 g/dL (ref 30.0–36.0)
MCV: 93.1 fL (ref 80.0–100.0)
Platelets: 316 10*3/uL (ref 150–400)
RBC: 4.62 MIL/uL (ref 4.22–5.81)
RDW: 13.6 % (ref 11.5–15.5)
WBC: 12.5 10*3/uL — ABNORMAL HIGH (ref 4.0–10.5)
nRBC: 0 % (ref 0.0–0.2)

## 2019-12-07 LAB — LIPASE, BLOOD: Lipase: 37 U/L (ref 11–51)

## 2019-12-07 LAB — CBG MONITORING, ED: Glucose-Capillary: 235 mg/dL — ABNORMAL HIGH (ref 70–99)

## 2019-12-07 NOTE — Discharge Instructions (Addendum)
Continue to drink plenty of water Bland diet No change in medication at this time Will call with blood test results and advice Avoid alcohol

## 2019-12-07 NOTE — ED Provider Notes (Signed)
[place]    CSN: [id number] Arrival date & time: 12/07/19  1439      History   Chief Complaint Chief Complaint  Patient presents with   Abdominal Pain    HPI [name] [name] is a 44 y.o. male.   HPI  Patient is here for abdominal pain.  He states is been worsening over about a 1 week period of time.  Starting to keep awake at night.  It comes in waves.  It is in the left upper abdomen.  He states it feels similar to when he had pancreatitis, he has had some nausea and vomiting intermittently with this.  He has not had any constipation.  He feels like this is not pancreatitis, because he is having normal bowel movements, and passing normal urine.  No fever or chills.  No nausea at the time. Patient does have a history of pancreatitis.  He was drinking more at the time he was diagnosed with this.  He still drinks alcohol 3-5 drinks a week. He smokes cigars. Patient is a newly diagnosed diabetic.  His hemoglobin A1c was 8.1 and he was placed on Metformin about 9 days ago.  He states he does not think this pain is caused from the Metformin. He has not checked his sugar in a couple days.  He is not having urinary frequency.  Blood sugar today is 235 Medical history is reviewed.  Chart is reviewed.  Known coronary artery disease, hypertension, low libido, history of neurosyphilis 2016.  Past Medical History:  Diagnosis Date   Anxiety    Coronary artery disease    a. 05/2014 OOH MI/PCI: LM nl, LAD 40p, 99p (3.0x28 Synergy DES) w/ R->L Collaterals, LCX nl, RCA 30p/d, EF 55% w/ sev distal inf/ant HK.   Depression    Diabetes mellitus without complication ([facility])    Erectile dysfunction    H/O echocardiogram    a. 05/2014 Echo: Ef 45-50%, sev mid antsept/ant HK.   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     Pancreatitis 10/2018   Tobacco abuse     Patient Active  Problem List   Diagnosis Date Noted   History of pancreatitis 05/30/2019   Situational anxiety 05/30/2019   Chest pain 11/10/2017   Alcohol dependence with uncomplicated withdrawal ([place]) 10/27/2017   Food allergy 12/24/2015   Hyperlipidemia    Hypertension    CAD (coronary artery disease), native coronary artery 05/24/2014   Family history of premature CAD 05/08/2014   History of syphilis    High risk sexual behavior    Tobacco use disorder     Past Surgical History:  Procedure Laterality Date   CORONARY STENT PLACEMENT  05/24/2014   MID LAD DES    LEFT HEART CATHETERIZATION WITH CORONARY ANGIOGRAM N/A 05/24/2014   Procedure: LEFT HEART CATHETERIZATION WITH CORONARY ANGIOGRAM;  Surgeon: Jettie Booze, MD;  Location: [facility] CATH LAB;  Service: Cardiovascular;  Laterality: N/A;   PERCUTANEOUS CORONARY STENT INTERVENTION (PCI-S)  05/24/2014   Procedure: PERCUTANEOUS CORONARY STENT INTERVENTION (PCI-S);  Surgeon: Jettie Booze, MD;  Location: [facility] CATH LAB;  Service: Cardiovascular;;   PILONIDAL CYST EXCISION  1995       Home Medications    Prior to Admission medications   Medication Sig Start Date End Date Taking? Authorizing Provider  ALPRAZolam (XANAX) 0.5 MG tablet TAKE ONE TABLET BY MOUTH DAILY AS  NEEDED FOR ANXIETY 10/31/19  Yes Denita Lung, MD  aspirin EC 81 MG EC tablet Take 1 tablet (81 mg total) by mouth daily. 05/25/14  Yes Theora Gianotti, NP  atorvastatin (LIPITOR) 40 MG tablet TAKE ONE TABLET BY MOUTH DAILY AT 6PM 07/16/19  Yes Lendon Colonel, NP  lisinopril-hydrochlorothiazide (ZESTORETIC) 20-12.5 MG tablet Take 2 tablets by mouth daily. Rx for 40-25 mg daily 07/16/19  Yes Lendon Colonel, NP  metFORMIN (GLUCOPHAGE) 500 MG tablet Take 1 tablet (500 mg total) by mouth 2 (two) times daily with a meal. 11/15/19  Yes Denita Lung, MD  metoprolol tartrate (LOPRESSOR) 50 MG tablet Take 1 tablet (50 mg total) by mouth 2 (two) times  daily. 07/16/19  Yes Lendon Colonel, NP  albuterol (VENTOLIN HFA) 108 (90 Base) MCG/ACT inhaler Inhale 2 puffs into the lungs every 6 (six) hours as needed for wheezing or shortness of breath. 07/16/19   Lendon Colonel, NP  emtricitabine-tenofovir (TRUVADA) 200-300 MG tablet  11/13/19   [provider]  glucose blood (ONETOUCH VERIO) test strip Use as instructed to check blood sugar once daily. 11/30/19   Denita Lung, MD  Multiple Vitamin (MULTIVITAMIN WITH MINERALS) TABS tablet Take 1 tablet by mouth daily.    [provider]  OneTouch Delica Lancets 29H MISC 1 each by Does not apply route daily. Check blood sugar once daily 11/30/19   Denita Lung, MD  testosterone cypionate (DEPOTESTOSTERONE CYPIONATE) 200 MG/ML injection Inject 1 mL (200 mg total) into the muscle every 14 (fourteen) days. 11/23/19   Denita Lung, MD    Family History Family History  Problem Relation Age of Onset   Thyroid disease Mother    Heart disease Father 23       MI, stents   Peripheral Artery Disease Father    Diabetes type II Father    Diabetes Father    Diabetes Maternal Grandmother    Hypertension Maternal Grandfather    Hypertension Paternal Grandfather    Cancer Neg Hx    Stroke Neg Hx     Social History Social History   Tobacco Use   Smoking status: Current Every Day Smoker    Packs/day: 1.00    Years: 23.00    Pack years: 23.00    Types: Cigarettes   Smokeless tobacco: Never Used   Tobacco comment: currently smoking 0.5 ppd, declines patch  Vaping Use   Vaping Use: Never used  Substance Use Topics   Alcohol use: Yes    Alcohol/week: 24.0 standard drinks    Types: 24 Cans of beer per week    Comment: 1/5-3/4 [place], 4-5 times a week.  +Guilty, cutting down; 2/4 CAGE questions.   Drug use: No     Allergies   Gadolinium derivatives   Review of Systems Review of Systems See HPI Physical Exam Triage Vital Signs ED Triage Vitals  Enc  Vitals Group     BP 12/07/19 1705 128/86     Pulse Rate 12/07/19 1705 (!) 101     Resp 12/07/19 1705 17     Temp 12/07/19 1705 97.9 F (36.6 C)     Temp Source 12/07/19 1705 Oral     SpO2 12/07/19 1705 97 %     Weight --      Height --      Head Circumference --      Peak Flow --      Pain Score 12/07/19 1706 7  Pain Loc --      Pain Edu? --      Excl. in [place]? --    No data found.  Updated Vital Signs BP 128/86 (BP Location: Left Arm)    Pulse (!) 101    Temp 97.9 F (36.6 C) (Oral)    Resp 17    SpO2 97%      Physical Exam Constitutional:      General: He is not in acute distress.    Appearance: He is well-developed.  HENT:     Head: Normocephalic and atraumatic.  Eyes:     Conjunctiva/sclera: Conjunctivae normal.     Pupils: Pupils are equal, round, and reactive to light.  Cardiovascular:     Rate and Rhythm: Normal rate and regular rhythm.     Pulses: Normal pulses.     Heart sounds: Normal heart sounds.  Pulmonary:     Effort: Pulmonary effort is normal. No respiratory distress.     Breath sounds: Wheezing present. No rhonchi or rales.     Comments: Few wheezes in bases Abdominal:     General: Abdomen is flat. Bowel sounds are normal. There is no distension.     Palpations: Abdomen is soft. There is no mass.     Tenderness: There is abdominal tenderness.     Comments: Moderate tenderness to deep palpation in the left middle abdomen.  No guarding or rebound.  No organomegaly  Musculoskeletal:        General: Normal range of motion.     Cervical back: Normal range of motion.  Skin:    General: Skin is warm and dry.  Neurological:     General: No focal deficit present.     Mental Status: He is alert.  Psychiatric:        Mood and Affect: Mood normal.        Behavior: Behavior normal.      UC Treatments / Results  Labs (all labs ordered are listed, but only abnormal results are displayed) Labs Reviewed  CBC - Abnormal; Notable for the following  components:      Result Value   WBC 12.5 (*)    All other components within normal limits  COMPREHENSIVE METABOLIC PANEL - Abnormal; Notable for the following components:   Sodium 130 (*)    Chloride 92 (*)    Glucose, Bld 274 (*)    Albumin 3.2 (*)    All other components within normal limits  CBG MONITORING, ED - Abnormal; Notable for the following components:   Glucose-Capillary 235 (*)    All other components within normal limits  POCT URINALYSIS DIPSTICK, ED / UC - Abnormal; Notable for the following components:   Glucose, UA 500 (*)    All other components within normal limits  LIPASE, BLOOD    EKG   Radiology No results found.  Procedures Procedures (including critical care time)  Medications Ordered in UC Medications - No data to display  Initial Impression / Assessment and Plan / UC Course  I have reviewed the triage vital signs and the nursing notes.  Pertinent labs & imaging results that were available during my care of the patient were reviewed by me and considered in my medical decision making (see chart for details).     Discussed abdominal pain.  Pancreatitis.  Possibility that it is caused by the diabetes of diabetes medication. Will check lab work and call him.  He is not acute toxic, or severe pain.  Do  not feel ER visit is indicated at this time  Addendum: Attempted to reach patient after office closed, 830.  Left message on his answering machine.  His lab work shows some hyponatremia likely due to his hyperglycemia.  This should come under better control as his diabetes improves.  He needs to follow-up with his primary care doctor regarding diabetes.  Be careful with carbohydrates in the diet.  His white blood count is mildly elevated at 12.5.  His last white point was similarly elevated.  He does not have any fever chills or signs of infection.  If his pain persists he needs to come the emergency room over the weekend. Lipase is normal at 37.  Low  likelihood of pancreatitis Final Clinical Impressions(s) / UC Diagnoses   Final diagnoses:  Left upper quadrant abdominal pain  Non-intractable vomiting with nausea, unspecified vomiting type     Discharge Instructions     Continue to drink plenty of water [place] diet No change in medication at this time Will call with blood test results and advice Avoid alcohol    ED Prescriptions    None     PDMP not reviewed this encounter.   Raylene Everts, MD 12/07/19 2117

## 2019-12-07 NOTE — Telephone Encounter (Signed)
Left message that labs normal

## 2019-12-07 NOTE — ED Triage Notes (Signed)
Patient in with c/o of cramping abdominal pain that started about 1 week ago. Pain radiates from right side of abdomen to left side and across entire back.  Patient has loss of appetite due to pain but has been tolerating fluids.  Had vomiting episode last week after starting new diabetes medication. Patient has not taken any otc meds to help with sx Last checked cbg 3 days ago Denies any nausea, fever, diarrhea or other symptoms

## 2019-12-07 NOTE — ED Notes (Signed)
Pt called in the lobby and on the phone with no answer

## 2019-12-09 ENCOUNTER — Encounter (HOSPITAL_COMMUNITY): Payer: Self-pay | Admitting: Emergency Medicine

## 2019-12-09 ENCOUNTER — Emergency Department (HOSPITAL_COMMUNITY): Payer: 59

## 2019-12-09 ENCOUNTER — Other Ambulatory Visit: Payer: Self-pay

## 2019-12-09 ENCOUNTER — Inpatient Hospital Stay (HOSPITAL_COMMUNITY)
Admission: EM | Admit: 2019-12-09 | Discharge: 2019-12-12 | DRG: 439 | Disposition: A | Discharge status: Home / Self Care | Payer: 59 | Attending: Internal Medicine | Admitting: Internal Medicine

## 2019-12-09 DIAGNOSIS — E1165 Type 2 diabetes mellitus with hyperglycemia: Secondary | ICD-10-CM | POA: Diagnosis not present

## 2019-12-09 DIAGNOSIS — E1110 Type 2 diabetes mellitus with ketoacidosis without coma: Secondary | ICD-10-CM | POA: Diagnosis not present

## 2019-12-09 DIAGNOSIS — R6882 Decreased libido: Secondary | ICD-10-CM | POA: Diagnosis present

## 2019-12-09 DIAGNOSIS — Z8249 Family history of ischemic heart disease and other diseases of the circulatory system: Secondary | ICD-10-CM

## 2019-12-09 DIAGNOSIS — E785 Hyperlipidemia, unspecified: Secondary | ICD-10-CM | POA: Diagnosis present

## 2019-12-09 DIAGNOSIS — K859 Acute pancreatitis without necrosis or infection, unspecified: Secondary | ICD-10-CM | POA: Diagnosis not present

## 2019-12-09 DIAGNOSIS — Z7984 Long term (current) use of oral hypoglycemic drugs: Secondary | ICD-10-CM

## 2019-12-09 DIAGNOSIS — F329 Major depressive disorder, single episode, unspecified: Secondary | ICD-10-CM | POA: Diagnosis present

## 2019-12-09 DIAGNOSIS — K863 Pseudocyst of pancreas: Secondary | ICD-10-CM

## 2019-12-09 DIAGNOSIS — Z8719 Personal history of other diseases of the digestive system: Secondary | ICD-10-CM | POA: Diagnosis not present

## 2019-12-09 DIAGNOSIS — F17210 Nicotine dependence, cigarettes, uncomplicated: Secondary | ICD-10-CM | POA: Diagnosis present

## 2019-12-09 DIAGNOSIS — Z8349 Family history of other endocrine, nutritional and metabolic diseases: Secondary | ICD-10-CM

## 2019-12-09 DIAGNOSIS — I10 Essential (primary) hypertension: Secondary | ICD-10-CM | POA: Diagnosis present

## 2019-12-09 DIAGNOSIS — E871 Hypo-osmolality and hyponatremia: Secondary | ICD-10-CM | POA: Diagnosis present

## 2019-12-09 DIAGNOSIS — Z833 Family history of diabetes mellitus: Secondary | ICD-10-CM

## 2019-12-09 DIAGNOSIS — Z955 Presence of coronary angioplasty implant and graft: Secondary | ICD-10-CM

## 2019-12-09 DIAGNOSIS — F419 Anxiety disorder, unspecified: Secondary | ICD-10-CM | POA: Diagnosis present

## 2019-12-09 DIAGNOSIS — F10230 Alcohol dependence with withdrawal, uncomplicated: Secondary | ICD-10-CM | POA: Diagnosis not present

## 2019-12-09 DIAGNOSIS — Z7982 Long term (current) use of aspirin: Secondary | ICD-10-CM

## 2019-12-09 DIAGNOSIS — F17290 Nicotine dependence, other tobacco product, uncomplicated: Secondary | ICD-10-CM | POA: Diagnosis present

## 2019-12-09 DIAGNOSIS — Z20822 Contact with and (suspected) exposure to covid-19: Secondary | ICD-10-CM | POA: Diagnosis present

## 2019-12-09 DIAGNOSIS — I2510 Atherosclerotic heart disease of native coronary artery without angina pectoris: Secondary | ICD-10-CM | POA: Diagnosis present

## 2019-12-09 DIAGNOSIS — Z79899 Other long term (current) drug therapy: Secondary | ICD-10-CM

## 2019-12-09 LAB — COMPREHENSIVE METABOLIC PANEL
ALT: 14 U/L (ref 0–44)
AST: 18 U/L (ref 15–41)
Albumin: 3.7 g/dL (ref 3.5–5.0)
Alkaline Phosphatase: 80 U/L (ref 38–126)
Anion gap: 18 — ABNORMAL HIGH (ref 5–15)
BUN: 10 mg/dL (ref 6–20)
CO2: 20 mmol/L — ABNORMAL LOW (ref 22–32)
Calcium: 9.4 mg/dL (ref 8.9–10.3)
Chloride: 90 mmol/L — ABNORMAL LOW (ref 98–111)
Creatinine, Ser: 0.86 mg/dL (ref 0.61–1.24)
GFR calc Af Amer: >60 mL/min (ref >60)
GFR calc non Af Amer: >60 mL/min (ref >60)
Glucose, Bld: 247 mg/dL — ABNORMAL HIGH (ref 70–99)
Potassium: 3.6 mmol/L (ref 3.5–5.1)
Sodium: 128 mmol/L — ABNORMAL LOW (ref 135–145)
Total Bilirubin: 1.3 mg/dL — ABNORMAL HIGH (ref 0.3–1.2)
Total Protein: 7.8 g/dL (ref 6.5–8.1)

## 2019-12-09 LAB — URINALYSIS, ROUTINE W REFLEX MICROSCOPIC
Bacteria, UA: NONE SEEN
Bilirubin Urine: NEGATIVE
Glucose, UA: >=500 mg/dL — AB
Hgb urine dipstick: NEGATIVE
Ketones, ur: 80 mg/dL — AB
Leukocytes,Ua: NEGATIVE
Nitrite: NEGATIVE
Protein, ur: 100 mg/dL — AB
Specific Gravity, Urine: 1.024 (ref 1.005–1.030)
pH: 5 (ref 5.0–8.0)

## 2019-12-09 LAB — CBC
HCT: 46.4 % (ref 39.0–52.0)
Hemoglobin: 16.4 g/dL (ref 13.0–17.0)
MCH: 32 pg (ref 26.0–34.0)
MCHC: 35.3 g/dL (ref 30.0–36.0)
MCV: 90.4 fL (ref 80.0–100.0)
Platelets: 331 10*3/uL (ref 150–400)
RBC: 5.13 MIL/uL (ref 4.22–5.81)
RDW: 13.3 % (ref 11.5–15.5)
WBC: 19.4 10*3/uL — ABNORMAL HIGH (ref 4.0–10.5)
nRBC: 0 % (ref 0.0–0.2)

## 2019-12-09 LAB — RESPIRATORY PANEL BY RT PCR (FLU A&B, COVID)
Influenza A by PCR: NEGATIVE
Influenza B by PCR: NEGATIVE
SARS Coronavirus 2 by RT PCR: NEGATIVE

## 2019-12-09 LAB — GLUCOSE, CAPILLARY
Glucose-Capillary: 175 mg/dL — ABNORMAL HIGH (ref 70–99)
Glucose-Capillary: 182 mg/dL — ABNORMAL HIGH (ref 70–99)

## 2019-12-09 LAB — MAGNESIUM: Magnesium: 1.4 mg/dL — ABNORMAL LOW (ref 1.7–2.4)

## 2019-12-09 LAB — LIPASE, BLOOD: Lipase: 86 U/L — ABNORMAL HIGH (ref 11–51)

## 2019-12-09 LAB — LIPID PANEL
Cholesterol: 95 mg/dL (ref 0–200)
HDL: 31 mg/dL — ABNORMAL LOW (ref >40)
LDL Cholesterol: 53 mg/dL (ref 0–99)
Total CHOL/HDL Ratio: 3.1 RATIO
Triglycerides: 53 mg/dL (ref <150)
VLDL: 11 mg/dL (ref 0–40)

## 2019-12-09 LAB — HIV ANTIBODY (ROUTINE TESTING W REFLEX): HIV Screen 4th Generation wRfx: NONREACTIVE

## 2019-12-09 LAB — BETA-HYDROXYBUTYRIC ACID
Beta-Hydroxybutyric Acid: 1.61 mmol/L — ABNORMAL HIGH (ref 0.05–0.27)
Beta-Hydroxybutyric Acid: 0.69 mmol/L — ABNORMAL HIGH (ref 0.05–0.27)
Beta-Hydroxybutyric Acid: 3.56 mmol/L — ABNORMAL HIGH (ref 0.05–0.27)

## 2019-12-09 LAB — CBG MONITORING, ED: Glucose-Capillary: 105 mg/dL — ABNORMAL HIGH (ref 70–99)

## 2019-12-09 MED ORDER — SODIUM CHLORIDE 0.9 % IV SOLN: INTRAVENOUS | Status: DC

## 2019-12-09 MED ORDER — INSULIN GLARGINE 100 UNIT/ML ~~LOC~~ SOLN: 5.0000 [IU] | Freq: Once | SUBCUTANEOUS | Status: DC

## 2019-12-09 MED ORDER — INSULIN ASPART 100 UNIT/ML ~~LOC~~ SOLN
5.0000 [IU] | Freq: Once | SUBCUTANEOUS | Status: AC
  Administered 2019-12-09: 5 [IU] via SUBCUTANEOUS
  Filled 2019-12-09: qty 0.05

## 2019-12-09 MED ORDER — ENOXAPARIN SODIUM 40 MG/0.4ML ~~LOC~~ SOLN
40.0000 mg | SUBCUTANEOUS | Status: DC
  Administered 2019-12-09 – 2019-12-11 (×3): 40 mg via SUBCUTANEOUS
  Filled 2019-12-09 (×3): qty 0.4

## 2019-12-09 MED ORDER — HYDRALAZINE HCL 20 MG/ML IJ SOLN: 5.0000 mg | Freq: Four times a day (QID) | INTRAMUSCULAR | Status: DC | PRN

## 2019-12-09 MED ORDER — SODIUM CHLORIDE 0.9 % IV BOLUS
1000.0000 mL | Freq: Once | INTRAVENOUS | Status: AC
  Administered 2019-12-09: 1000 mL via INTRAVENOUS

## 2019-12-09 MED ORDER — ASPIRIN EC 81 MG PO TBEC
81.0000 mg | DELAYED_RELEASE_TABLET | Freq: Every day | ORAL | Status: DC
  Administered 2019-12-10 – 2019-12-12 (×3): 81 mg via ORAL
  Filled 2019-12-09 (×3): qty 1

## 2019-12-09 MED ORDER — METOPROLOL TARTRATE 25 MG PO TABS
25.0000 mg | ORAL_TABLET | Freq: Two times a day (BID) | ORAL | Status: DC
  Administered 2019-12-09 – 2019-12-12 (×7): 25 mg via ORAL
  Filled 2019-12-09 (×7): qty 1

## 2019-12-09 MED ORDER — ONDANSETRON HCL 4 MG/2ML IJ SOLN
4.0000 mg | Freq: Once | INTRAMUSCULAR | Status: AC
  Administered 2019-12-09: 4 mg via INTRAVENOUS
  Filled 2019-12-09: qty 2

## 2019-12-09 MED ORDER — MAGNESIUM SULFATE 2 GM/50ML IV SOLN
2.0000 g | Freq: Once | INTRAVENOUS | Status: AC
  Administered 2019-12-09: 2 g via INTRAVENOUS
  Filled 2019-12-09: qty 50

## 2019-12-09 MED ORDER — IOHEXOL 300 MG/ML  SOLN
100.0000 mL | Freq: Once | INTRAMUSCULAR | Status: AC | PRN
  Administered 2019-12-09: 100 mL via INTRAVENOUS

## 2019-12-09 MED ORDER — MORPHINE SULFATE (PF) 4 MG/ML IV SOLN
4.0000 mg | Freq: Once | INTRAVENOUS | Status: AC
  Administered 2019-12-09: 4 mg via INTRAVENOUS
  Filled 2019-12-09: qty 1

## 2019-12-09 MED ORDER — LACTATED RINGERS IV SOLN: INTRAVENOUS | Status: AC

## 2019-12-09 MED ORDER — ONDANSETRON HCL 4 MG/2ML IJ SOLN: 4.0000 mg | Freq: Four times a day (QID) | INTRAMUSCULAR | Status: DC | PRN

## 2019-12-09 MED ORDER — ACETAMINOPHEN 325 MG PO TABS: 650.0000 mg | ORAL_TABLET | Freq: Four times a day (QID) | ORAL | Status: DC | PRN

## 2019-12-09 MED ORDER — POTASSIUM CHLORIDE 10 MEQ/100ML IV SOLN
10.0000 meq | INTRAVENOUS | Status: AC
  Administered 2019-12-09 (×2): 10 meq via INTRAVENOUS
  Filled 2019-12-09 (×2): qty 100

## 2019-12-09 MED ORDER — ONDANSETRON HCL 4 MG PO TABS: 4.0000 mg | ORAL_TABLET | Freq: Four times a day (QID) | ORAL | Status: DC | PRN

## 2019-12-09 MED ORDER — MORPHINE SULFATE (PF) 2 MG/ML IV SOLN
1.0000 mg | INTRAVENOUS | Status: DC | PRN
  Administered 2019-12-09 – 2019-12-12 (×7): 1 mg via INTRAVENOUS
  Filled 2019-12-09 (×7): qty 1

## 2019-12-09 MED ORDER — ACETAMINOPHEN 650 MG RE SUPP: 650.0000 mg | Freq: Four times a day (QID) | RECTAL | Status: DC | PRN

## 2019-12-09 MED ORDER — FENTANYL CITRATE (PF) 100 MCG/2ML IJ SOLN
25.0000 ug | Freq: Once | INTRAMUSCULAR | Status: AC
  Administered 2019-12-09: 25 ug via INTRAVENOUS
  Filled 2019-12-09: qty 2

## 2019-12-09 MED ORDER — ATORVASTATIN CALCIUM 40 MG PO TABS
40.0000 mg | ORAL_TABLET | Freq: Every day | ORAL | Status: DC
  Administered 2019-12-10 – 2019-12-12 (×3): 40 mg via ORAL
  Filled 2019-12-09 (×3): qty 1

## 2019-12-09 MED ORDER — LACTATED RINGERS IV BOLUS
1000.0000 mL | Freq: Once | INTRAVENOUS | Status: AC
  Administered 2019-12-09: 1000 mL via INTRAVENOUS

## 2019-12-09 NOTE — Progress Notes (Signed)
Patient just arrived from the ED, HR is 116, making him a yellow MEWS, protocol started. MD Hal Hope notified.

## 2019-12-09 NOTE — ED Notes (Signed)
Patient deferred blood draw from previous Triage RN stating that he had blood drawn recently at urgent care and they could use that as a reference point. Patient noted to be sitting in lobby at this time clutching his side.

## 2019-12-09 NOTE — ED Triage Notes (Signed)
Pt reports L flank pain that wraps around his abdomen for a little over a week. Reports that eating ans drinking makes the pain worse. He reports a hx of pancreatitis. Denies hematuria. Denies vomiting and endorses normal BMs. States that he feels dehydrated.

## 2019-12-09 NOTE — ED Provider Notes (Signed)
[facility] DEPT Provider Note   CSN: [id number] Arrival date & time: 12/09/19  0201     History Chief Complaint  Patient presents with  . Flank Pain  . Abdominal Cramping    [name] [name] is a 44 y.o. male with a past medical history of hypertension, diabetes currently on Metformin, prior pancreatitis presenting to the ED with a chief complaint of abdominal pain and flank pain.  Symptoms began approximately 7 to 8 days ago.  Reports constant pain that will intermittently "spasm" and worsen.  Reports associated nausea and vomiting.  Normal bowel movements.  He states the pain does feel similar to his episode of pancreatitis in the past although at that time he did have changes to bowel movements as well.  He has taken Tylenol for pain with minimal improvement.  Was seen and evaluated at urgent care 2 days ago and was told after reassuring work-up that he should follow a bland diet and follow-up with PCP.  He reports ongoing pain that has worsened since then.  Reports chills.  Denies any chest pain, shortness of breath.  No dysuria, fever, prior abdominal surgeries, sick contacts with similar symptoms.  HPI     Past Medical History:  Diagnosis Date  . Anxiety   . Coronary artery disease    a. 05/2014 OOH MI/PCI: LM nl, LAD 40p, 99p (3.0x28 Synergy DES) w/ R->L Collaterals, LCX nl, RCA 30p/d, EF 55% w/ sev distal inf/ant HK.  . Depression   . Diabetes mellitus without complication ([place])   . Erectile dysfunction   . H/O echocardiogram    a. 05/2014 Echo: Ef 45-50%, sev mid antsept/ant HK.  Marland Kitchen Hyperlipidemia   . Hypertension    medication in 2012, stress as a big component  . 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    Marland Kitchen Pancreatitis 10/2018  . Tobacco abuse     Patient Active Problem List   Diagnosis Date Noted  . Pancreatitis 12/09/2019  . History of pancreatitis 05/30/2019  . Situational anxiety 05/30/2019  . Chest pain 11/10/2017  .  Alcohol dependence with uncomplicated withdrawal ([place]) 10/27/2017  . Food allergy 12/24/2015  . Hyperlipidemia   . Hypertension   . CAD (coronary artery disease), native coronary artery 05/24/2014  . Family history of premature CAD 05/08/2014  . History of syphilis   . High risk sexual behavior   . Tobacco use disorder     Past Surgical History:  Procedure Laterality Date  . CORONARY STENT PLACEMENT  05/24/2014   MID LAD DES   . LEFT HEART CATHETERIZATION WITH CORONARY ANGIOGRAM N/A 05/24/2014   Procedure: LEFT HEART CATHETERIZATION WITH CORONARY ANGIOGRAM;  Surgeon: Jettie Booze, MD;  Location: [facility] CATH LAB;  Service: Cardiovascular;  Laterality: N/A;  . PERCUTANEOUS CORONARY STENT INTERVENTION (PCI-S)  05/24/2014   Procedure: PERCUTANEOUS CORONARY STENT INTERVENTION (PCI-S);  Surgeon: Jettie Booze, MD;  Location: [facility] CATH LAB;  Service: Cardiovascular;;  . PILONIDAL CYST EXCISION  1995       Family History  Problem Relation Age of Onset  . Thyroid disease Mother   . Heart disease Father 5       MI, stents  . Peripheral Artery Disease Father   . Diabetes type II Father   . Diabetes Father   . Diabetes Maternal Grandmother   . Hypertension Maternal Grandfather   . Hypertension Paternal Grandfather   . Cancer Neg Hx   . Stroke Neg Hx  Social History   Tobacco Use  . Smoking status: Current Every Day Smoker    Packs/day: 1.00    Years: 23.00    Pack years: 23.00    Types: Cigarettes  . Smokeless tobacco: Never Used  . Tobacco comment: currently smoking 0.5 ppd, declines patch  Vaping Use  . Vaping Use: Never used  Substance Use Topics  . Alcohol use: Yes    Alcohol/week: 24.0 standard drinks    Types: 24 Cans of beer per week    Comment: 1/5-3/4 [place], 4-5 times a week.  +Guilty, cutting down; 2/4 CAGE questions.  . Drug use: No    Home Medications Prior to Admission medications   Medication Sig Start Date End Date Taking? Authorizing Provider   albuterol (VENTOLIN HFA) 108 (90 Base) MCG/ACT inhaler Inhale 2 puffs into the lungs every 6 (six) hours as needed for wheezing or shortness of breath. 07/16/19   Lendon Colonel, NP  ALPRAZolam Duanne Moron) 0.5 MG tablet TAKE ONE TABLET BY MOUTH DAILY AS NEEDED FOR ANXIETY 10/31/19   Denita Lung, MD  aspirin EC 81 MG EC tablet Take 1 tablet (81 mg total) by mouth daily. 05/25/14   Theora Gianotti, NP  atorvastatin (LIPITOR) 40 MG tablet TAKE ONE TABLET BY MOUTH DAILY AT 6PM 07/16/19   Lendon Colonel, NP  emtricitabine-tenofovir (TRUVADA) 200-300 MG tablet  11/13/19   [provider]  glucose blood (ONETOUCH VERIO) test strip Use as instructed to check blood sugar once daily. 11/30/19   Denita Lung, MD  lisinopril-hydrochlorothiazide (ZESTORETIC) 20-12.5 MG tablet Take 2 tablets by mouth daily. Rx for 40-25 mg daily 07/16/19   Lendon Colonel, NP  metFORMIN (GLUCOPHAGE) 500 MG tablet Take 1 tablet (500 mg total) by mouth 2 (two) times daily with a meal. 11/15/19   Denita Lung, MD  metoprolol tartrate (LOPRESSOR) 50 MG tablet Take 1 tablet (50 mg total) by mouth 2 (two) times daily. 07/16/19   Lendon Colonel, NP  Multiple Vitamin (MULTIVITAMIN WITH MINERALS) TABS tablet Take 1 tablet by mouth daily.    [provider]  OneTouch Delica Lancets 81X MISC 1 each by Does not apply route daily. Check blood sugar once daily 11/30/19   Denita Lung, MD  testosterone cypionate (DEPOTESTOSTERONE CYPIONATE) 200 MG/ML injection Inject 1 mL (200 mg total) into the muscle every 14 (fourteen) days. 11/23/19   Denita Lung, MD    Allergies    Gadolinium derivatives  Review of Systems   Review of Systems  Constitutional: Negative for appetite change, chills and fever.  HENT: Negative for ear pain, rhinorrhea, sneezing and sore throat.   Eyes: Negative for photophobia and visual disturbance.  Respiratory: Negative for cough, chest tightness, shortness of breath and  wheezing.   Cardiovascular: Negative for chest pain and palpitations.  Gastrointestinal: Positive for abdominal pain, nausea and vomiting. Negative for blood in stool, constipation and diarrhea.  Genitourinary: Positive for flank pain. Negative for dysuria, hematuria and urgency.  Musculoskeletal: Negative for myalgias.  Skin: Negative for rash.  Neurological: Negative for dizziness, weakness and light-headedness.    Physical Exam Updated Vital Signs BP (!) 161/103   Pulse (!) 112   Temp 98.4 F (36.9 C) (Oral)   Resp 20   Ht 5' 4"  (1.626 m)   Wt 55.3 kg   SpO2 97%   BMI 20.94 kg/m   Physical Exam Vitals and nursing note reviewed.  Constitutional:      General:  He is not in acute distress.    Appearance: He is well-developed.  HENT:     Head: Normocephalic and atraumatic.     Nose: Nose normal.  Eyes:     General: No scleral icterus.       Left eye: No discharge.     Conjunctiva/sclera: Conjunctivae normal.  Cardiovascular:     Rate and Rhythm: Normal rate and regular rhythm.     Heart sounds: Normal heart sounds. No murmur heard.  No friction rub. No gallop.   Pulmonary:     Effort: Pulmonary effort is normal. No respiratory distress.     Breath sounds: Normal breath sounds.  Abdominal:     General: Bowel sounds are normal. There is no distension.     Palpations: Abdomen is soft.     Tenderness: There is abdominal tenderness (Epigastric and left upper quadrant). There is no guarding.  Musculoskeletal:        General: Normal range of motion.     Cervical back: Normal range of motion and neck supple.  Skin:    General: Skin is warm and dry.     Findings: No rash.  Neurological:     Mental Status: He is alert.     Motor: No abnormal muscle tone.     Coordination: Coordination normal.     ED Results / Procedures / Treatments   Labs (all labs ordered are listed, but only abnormal results are displayed) Labs Reviewed  LIPASE, BLOOD - Abnormal; Notable for the  following components:      Result Value   Lipase 86 (*)    All other components within normal limits  COMPREHENSIVE METABOLIC PANEL - Abnormal; Notable for the following components:   Sodium 128 (*)    Chloride 90 (*)    CO2 20 (*)    Glucose, Bld 247 (*)    Total Bilirubin 1.3 (*)    Anion gap 18 (*)    All other components within normal limits  CBC - Abnormal; Notable for the following components:   WBC 19.4 (*)    All other components within normal limits  URINALYSIS, ROUTINE W REFLEX MICROSCOPIC - Abnormal; Notable for the following components:   APPearance HAZY (*)    Glucose, UA >=500 (*)    Ketones, ur 80 (*)    Protein, ur 100 (*)    All other components within normal limits  RESPIRATORY PANEL BY RT PCR (FLU A&B, COVID)    EKG None  Radiology CT ABDOMEN PELVIS W CONTRAST  Result Date: 12/09/2019 CLINICAL DATA:  Epigastric abdominal pain. EXAM: CT ABDOMEN AND PELVIS WITH CONTRAST TECHNIQUE: Multidetector CT imaging of the abdomen and pelvis was performed using the standard protocol following bolus administration of intravenous contrast. CONTRAST:  128m OMNIPAQUE IOHEXOL 300 MG/ML  SOLN COMPARISON:  October 20, 2018. FINDINGS: Lower chest: No acute abnormality. Hepatobiliary: No focal liver abnormality is seen. No gallstones, gallbladder wall thickening, or biliary dilatation. Pancreas: Multiple fluid collections of varying sizes are seen within and surrounding the pancreas most consistent with pancreatic pseudocyst. Moderate inflammatory changes are noted suggesting acute pancreatitis. Largest fluid collection measures 6.2 x 3.0 cm posterior to the distal stomach consistent with pseudocyst. 3.9 x 3.7 cm fluid collection is noted in the pancreatic body and tail consistent with pseudocyst. Multiple other smaller collections are noted. Spleen: Normal in size without focal abnormality. Adrenals/Urinary Tract: Adrenal glands are unremarkable. Kidneys are normal, without renal calculi,  focal lesion, or hydronephrosis. Bladder is unremarkable.  Stomach/Bowel: Stomach is displaced anterior due to previously described pancreatic pseudocyst. Appendix appears normal. No evidence of bowel wall thickening, distention, or inflammatory changes. Vascular/Lymphatic: Aortic atherosclerosis. No enlarged abdominal or pelvic lymph nodes. Reproductive: Prostate is unremarkable. Other: No abdominal wall hernia or abnormality. No abdominopelvic ascites. Musculoskeletal: No acute or significant osseous findings. IMPRESSION: 1. Multiple fluid collections of varying sizes are seen within and surrounding the pancreas most consistent with pancreatic pseudocysts. Moderate inflammatory changes are noted suggesting acute pancreatitis. Largest fluid collection measures 6.2 x 3.0 cm posterior to the distal stomach consistent with pseudocyst. 2. Aortic atherosclerosis. Aortic Atherosclerosis (ICD10-I70.0). Electronically Signed   By: Marijo Conception M.D.   On: 12/09/2019 09:47    Procedures Procedures (including critical care time)  Medications Ordered in ED Medications  sodium chloride 0.9 % bolus 1,000 mL (1,000 mLs Intravenous New Bag/Given 12/09/19 0828)  ondansetron (ZOFRAN) injection 4 mg (4 mg Intravenous Given 12/09/19 0828)  morphine 4 MG/ML injection 4 mg (4 mg Intravenous Given 12/09/19 0828)  iohexol (OMNIPAQUE) 300 MG/ML solution 100 mL (100 mLs Intravenous Contrast Given 12/09/19 0921)  fentaNYL (SUBLIMAZE) injection 25 mcg (25 mcg Intravenous Given 12/09/19 1018)    ED Course  I have reviewed the triage vital signs and the nursing notes.  Pertinent labs & imaging results that were available during my care of the patient were reviewed by me and considered in my medical decision making (see chart for details).  Clinical Course as of Dec 08 1041  Sun Dec 09, 2019  0917 Increased from 12 at urgent care visit on 12/07/2019.  WBC(!): 19.4 [HK]  0917 Increased from 37 at urgent care visit.   Lipase(!): 86 [HK]    Clinical Course User Index [HK] Delia Heady, PA-C   MDM Rules/Calculators/A&P                          44 year old male with past medical history of hypertension, recently diagnosed with diabetes currently on Metformin, pancreatitis presenting to the ED with a chief complaint of abdominal pain and flank pain.  1 week history of constant pain with intermittent spasms that has worsened.  Minimal improvement noted with Tylenol.  Reports associated nausea and vomiting.  Urgent care evaluation 2 days ago with reassuring work-up and normal lipase.  Presents to the ED because his symptoms have worsened.  Denies chest pain or shortness of breath, changes to urination or bowel movements.  No fevers.  On exam patient has tenderness of the epigastric and left upper quadrant without rebound or guarding.  He is tachycardic which I feel is secondary to his pain.  Lungs are clear to auscultation bilaterally.  Urinalysis without evidence of hematuria or infection.  Lab work significant for leukocytosis of 19 which is elevated since work-up 2 days ago.  Lipase now abated to 86.  CT of abdomen pelvis shows acute pancreatitis with multiple pseudocysts. Patient remains tachycardic and reports ongoing pain.  He had an admission for alcohol induced pancreatitis about 1 year ago.  He denies daily alcohol use, states that he will drink alcohol only several times during the week with last drink being about 4 days ago. Will admit to hospitalist service for ongoing management of his recurrent pancreatitis. Appreciate the help of hospitalist for management of this patient.   Portions of this note were generated with [employer]. Dictation errors may occur despite best attempts at proofreading.  Final Clinical Impression(s) / ED Diagnoses Final  diagnoses:  Acute pancreatitis without infection or necrosis, unspecified pancreatitis type  Pancreatic pseudocyst    Rx / DC Orders ED  Discharge Orders    None       Delia Heady, PA-C 12/09/19 1043    Malvin Johns, MD 12/09/19 1056

## 2019-12-09 NOTE — H&P (Signed)
History and Physical        Hospital Admission Note Date: 12/09/2019  Patient name: [name] [name] Medical record number: [id number] Date of birth: [birth year] Age: 44 y.o. Gender: male  PCP: Denita Lung, MD  Patient coming from: Home   Chief Complaint    Chief Complaint  Patient presents with  . Flank Pain  . Abdominal Cramping      HPI:   This is a 44 year old male with a history of hypertension, diabetes, neurosyphilis (Tx in 2016) on Truvada for PrEP, alcoholic pancreatitis who presented to the ED with complaints of abdominal/flank pain x 7-8 days.   States that he recently received his first dose of testosterone injection roughly 2 weeks ago for low libido and low T, also started on Metformin roughly 2 weeks ago for newly diagnosed diabetes and recently switched from [place] to Truvada for PrEP about a month ago due to insurance changes.  He states his pain is intermittent and feels like spasms and is similar to prior pancreatitis episodes.  Also with intermittent diaphoresis.  Reports his last drink of alcohol was also about a week ago and drinks alcohol only every few weeks which is much less than he used to and denies any withdrawal symptoms.  Tylenol has not helped. Associated with nausea and vomiting. Seen at an urgent care 2 days ago and was told after reassuring workup that he should follow a bland diet and follow up with his PCP, however his symptoms have persisted/worsened.   ED Course: Tachycardic, afebrile and hemodynamically stable on room air. Notable labs: Na 128, Cl 90, CO2 20, Glucose 247, AG 18, Lipase 86, T bili 1.3, WBC 19.4, UA with 80 Ketones. CT abd/pelvis: acute pancreatitis with multiple pseudocysts. Given 1L NS bolus, Morphine, fentanyl and zofran.  Vitals:   12/09/19 1100 12/09/19 1130  BP: (!) 150/102 (!) 146/93  Pulse: (!) 116 (!) 117  Resp:     Temp:    SpO2: 91% 92%     Review of Systems:  Review of Systems  Constitutional: Positive for diaphoresis. Negative for chills and fever.  Respiratory: Negative for cough and shortness of breath.   Cardiovascular: Negative for chest pain and palpitations.  Gastrointestinal: Positive for abdominal pain, nausea and vomiting.  Genitourinary: Negative.   Musculoskeletal: Negative.  Negative for myalgias.  All other systems reviewed and are negative.   Medical/Social/Family History   Past Medical History: Past Medical History:  Diagnosis Date  . Anxiety   . Coronary artery disease    a. 05/2014 OOH MI/PCI: LM nl, LAD 40p, 99p (3.0x28 Synergy DES) w/ R->L Collaterals, LCX nl, RCA 30p/d, EF 55% w/ sev distal inf/ant HK.  . Depression   . Diabetes mellitus without complication ([place])   . Erectile dysfunction   . H/O echocardiogram    a. 05/2014 Echo: Ef 45-50%, sev mid antsept/ant HK.  Marland Kitchen Hyperlipidemia   . Hypertension    medication in 2012, stress as a big component  . 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    Marland Kitchen Pancreatitis 10/2018  . Tobacco abuse     Past Surgical History:  Procedure Laterality Date  . CORONARY STENT PLACEMENT  05/24/2014  MID LAD DES   . LEFT HEART CATHETERIZATION WITH CORONARY ANGIOGRAM N/A 05/24/2014   Procedure: LEFT HEART CATHETERIZATION WITH CORONARY ANGIOGRAM;  Surgeon: Jettie Booze, MD;  Location: [facility] CATH LAB;  Service: Cardiovascular;  Laterality: N/A;  . PERCUTANEOUS CORONARY STENT INTERVENTION (PCI-S)  05/24/2014   Procedure: PERCUTANEOUS CORONARY STENT INTERVENTION (PCI-S);  Surgeon: Jettie Booze, MD;  Location: [facility] CATH LAB;  Service: Cardiovascular;;  . PILONIDAL CYST EXCISION  1995    Medications: Prior to Admission medications   Medication Sig Start Date End Date Taking? Authorizing Provider  albuterol (VENTOLIN HFA) 108 (90 Base) MCG/ACT inhaler Inhale 2 puffs into the lungs every 6 (six) hours as needed for  wheezing or shortness of breath. 07/16/19  Yes Lendon Colonel, NP  ALPRAZolam Duanne Moron) 0.5 MG tablet TAKE ONE TABLET BY MOUTH DAILY AS NEEDED FOR ANXIETY Patient taking differently: Take 0.5 mg by mouth daily as needed for anxiety.  10/31/19  Yes Denita Lung, MD  aspirin EC 81 MG EC tablet Take 1 tablet (81 mg total) by mouth daily. 05/25/14  Yes Theora Gianotti, NP  atorvastatin (LIPITOR) 40 MG tablet TAKE ONE TABLET BY MOUTH DAILY AT 6PM Patient taking differently: Take 40 mg by mouth daily. TAKE ONE TABLET BY MOUTH DAILY AT 6PM 07/16/19  Yes Lendon Colonel, NP  emtricitabine-tenofovir (TRUVADA) 200-300 MG tablet Take 1 tablet by mouth daily.  11/13/19  Yes [provider]  lisinopril-hydrochlorothiazide (ZESTORETIC) 20-12.5 MG tablet Take 2 tablets by mouth daily. Rx for 40-25 mg daily 07/16/19  Yes Lendon Colonel, NP  metFORMIN (GLUCOPHAGE) 500 MG tablet Take 1 tablet (500 mg total) by mouth 2 (two) times daily with a meal. 11/15/19  Yes Denita Lung, MD  metoprolol tartrate (LOPRESSOR) 50 MG tablet Take 1 tablet (50 mg total) by mouth 2 (two) times daily. Patient taking differently: Take 25 mg by mouth 2 (two) times daily.  07/16/19  Yes Lendon Colonel, NP  Multiple Vitamin (MULTIVITAMIN WITH MINERALS) TABS tablet Take 1 tablet by mouth daily.   Yes [provider]  testosterone cypionate (DEPOTESTOSTERONE CYPIONATE) 200 MG/ML injection Inject 1 mL (200 mg total) into the muscle every 14 (fourteen) days. 11/23/19  Yes Denita Lung, MD  vitamin B-12 (CYANOCOBALAMIN) 1000 MCG tablet Take 1,000 mcg by mouth daily.   Yes [provider]  glucose blood (ONETOUCH VERIO) test strip Use as instructed to check blood sugar once daily. 11/30/19   Denita Lung, MD  OneTouch Delica Lancets 03J MISC 1 each by Does not apply route daily. Check blood sugar once daily 11/30/19   Denita Lung, MD    Allergies:   Allergies  Allergen Reactions  .  Gadolinium Derivatives Nausea Only    MRI dye    Social History:  reports that he has been smoking cigarettes. He has a 23.00 pack-year smoking history. He has never used smokeless tobacco. He reports current alcohol use of about 24.0 standard drinks of alcohol per week. He reports that he does not use drugs.  Family History: Family History  Problem Relation Age of Onset  . Thyroid disease Mother   . Heart disease Father 55       MI, stents  . Peripheral Artery Disease Father   . Diabetes type II Father   . Diabetes Father   . Diabetes Maternal Grandmother   . Hypertension Maternal Grandfather   . Hypertension Paternal Grandfather   . Cancer Neg Hx   .  Stroke Neg Hx      Objective   Physical Exam: Blood pressure (!) 146/93, pulse (!) 117, temperature 98.4 F (36.9 C), temperature source Oral, resp. rate 20, height 5' 4"  (1.626 m), weight 55.3 kg, SpO2 92 %.  Physical Exam Vitals and nursing note reviewed.  Constitutional:      Appearance: He is diaphoretic.  HENT:     Head: Normocephalic and atraumatic.  Eyes:     Conjunctiva/sclera: Conjunctivae normal.  Cardiovascular:     Rate and Rhythm: Regular rhythm. Tachycardia present.  Pulmonary:     Effort: Pulmonary effort is normal.     Breath sounds: Normal breath sounds.  Abdominal:     General: Abdomen is flat.     Tenderness: There is abdominal tenderness in the epigastric area and left upper quadrant.  Musculoskeletal:        General: No swelling or tenderness.  Skin:    Coloration: Skin is not jaundiced or pale.  Neurological:     Mental Status: He is alert. Mental status is at baseline.  Psychiatric:        Mood and Affect: Mood normal.        Behavior: Behavior normal.     LABS on Admission: I have personally reviewed all the labs and imaging below    Basic Metabolic Panel: Recent Labs  Lab 12/07/19 1743 12/09/19 0224  NA 130* 128*  K 4.1 3.6  CL 92* 90*  CO2 28 20*  GLUCOSE 274* 247*  BUN 6  10  CREATININE 0.77 0.86  CALCIUM 9.7 9.4   Liver Function Tests: Recent Labs  Lab 12/07/19 1743 12/09/19 0224  AST 16 18  ALT 14 14  ALKPHOS 70 80  BILITOT 0.8 1.3*  PROT 6.6 7.8  ALBUMIN 3.2* 3.7   Recent Labs  Lab 12/07/19 1743 12/09/19 0224  LIPASE 37 86*   No results for input(s): AMMONIA in the last 168 hours. CBC: Recent Labs  Lab 12/07/19 1743 12/07/19 1743 12/09/19 0224  WBC 12.5*  --  19.4*  HGB 14.8  --  16.4  HCT 43.0  --  46.4  MCV 93.1   < > 90.4  PLT 316  --  331   < > = values in this interval not displayed.   Cardiac Enzymes: No results for input(s): CKTOTAL, CKMB, CKMBINDEX, TROPONINI in the last 168 hours. BNP: Invalid input(s): POCBNP CBG: Recent Labs  Lab 12/07/19 1709  GLUCAP 235*    Radiological Exams on Admission:  CT ABDOMEN PELVIS W CONTRAST  Result Date: 12/09/2019 CLINICAL DATA:  Epigastric abdominal pain. EXAM: CT ABDOMEN AND PELVIS WITH CONTRAST TECHNIQUE: Multidetector CT imaging of the abdomen and pelvis was performed using the standard protocol following bolus administration of intravenous contrast. CONTRAST:  135m OMNIPAQUE IOHEXOL 300 MG/ML  SOLN COMPARISON:  October 20, 2018. FINDINGS: Lower chest: No acute abnormality. Hepatobiliary: No focal liver abnormality is seen. No gallstones, gallbladder wall thickening, or biliary dilatation. Pancreas: Multiple fluid collections of varying sizes are seen within and surrounding the pancreas most consistent with pancreatic pseudocyst. Moderate inflammatory changes are noted suggesting acute pancreatitis. Largest fluid collection measures 6.2 x 3.0 cm posterior to the distal stomach consistent with pseudocyst. 3.9 x 3.7 cm fluid collection is noted in the pancreatic body and tail consistent with pseudocyst. Multiple other smaller collections are noted. Spleen: Normal in size without focal abnormality. Adrenals/Urinary Tract: Adrenal glands are unremarkable. Kidneys are normal, without renal  calculi, focal lesion, or hydronephrosis. Bladder is  unremarkable. Stomach/Bowel: Stomach is displaced anterior due to previously described pancreatic pseudocyst. Appendix appears normal. No evidence of bowel wall thickening, distention, or inflammatory changes. Vascular/Lymphatic: Aortic atherosclerosis. No enlarged abdominal or pelvic lymph nodes. Reproductive: Prostate is unremarkable. Other: No abdominal wall hernia or abnormality. No abdominopelvic ascites. Musculoskeletal: No acute or significant osseous findings. IMPRESSION: 1. Multiple fluid collections of varying sizes are seen within and surrounding the pancreas most consistent with pancreatic pseudocysts. Moderate inflammatory changes are noted suggesting acute pancreatitis. Largest fluid collection measures 6.2 x 3.0 cm posterior to the distal stomach consistent with pseudocyst. 2. Aortic atherosclerosis. Aortic Atherosclerosis (ICD10-I70.0). Electronically Signed   By: Marijo Conception M.D.   On: 12/09/2019 09:47      EKG: Not performed in ED   A & P   Principal Problem:   Pancreatitis Active Problems:   Alcohol dependence with uncomplicated withdrawal ([place])   History of pancreatitis   Diabetic ketoacidosis without coma associated with type 2 diabetes mellitus ([place])   Pancreatic pseudocyst   1. Acute pancreatitis with pseudocysts, likely multifactorial: Medication induced and alcohol induced a. Recently switched from [place] to Truvada 1 month ago and chronically on lisinopril-HCTZ both of which can cause pancreatitis.  Also started Metformin and had his first testosterone injection and had his last alcoholic drink days prior to pancreatitis onset.  b. Lipase 86, T bili barely elevated 1.3, WBC 19.4 (likely reactive) c. Check triglycerides d. Hold Metformin, Truvada, lisinopril-HCTZ e. N.p.o. f. IV fluids g. Zofran  2. Hyperglycemia with recently diagnosed Diabetes  Anion gap metabolic acidosis, concern for mild  DKA a. Glucose 247, Na 128, AG 18, CO2: 20, urinary ketones 80 with glucosuria b. Hb A1c 8.8 on 11/15/19 c. Check serum ketones d. IV fluids e. Insulin nave and n.p.o. - will give Novolog 5 units x 1 with IV potassium f. Check magnesium  3. Hyponatremia a. Normal saline  4. Alcohol use a. Has cut down his use to several times per week to skipping a few weeks of use b. Last drink about 1 week ago c. Encourage abstinence    DVT prophylaxis: Lovenox   Code Status: Prior  Diet: N.p.o. Family Communication: Admission, patients condition and plan of care including tests being ordered have been discussed with the patient who indicates understanding and agrees with the plan and Code Status.   Disposition Plan: The appropriate patient status for this patient is OBSERVATION. Observation status is judged to be reasonable and necessary in order to provide the required intensity of service to ensure the patient's safety. The patient's presenting symptoms, physical exam findings, and initial radiographic and laboratory data in the context of their medical condition is felt to place them at decreased risk for further clinical deterioration. Furthermore, it is anticipated that the patient will be medically stable for discharge from the hospital within 2 midnights of admission. The following factors support the patient status of observation.   " The patient's presenting symptoms include abdominal pain, nausea and vomiting. " The physical exam findings include abdominal pain, tachycardia. " The initial radiographic and laboratory data are pancreatitis, hyperglycemia.    Status is: Observation  The patient remains OBS appropriate and will d/c before 2 midnights.  Dispo: The patient is from: Home              Anticipated d/c is to: Home              Anticipated d/c date is: 1 day  Patient currently is not medically stable to d/c.     Consultants  . None  Procedures  . None  Time  Spent on Admission: 60 minutes    Harold Hedge, DO [facility]  12/09/2019, 12:03 PM

## 2019-12-10 DIAGNOSIS — E871 Hypo-osmolality and hyponatremia: Secondary | ICD-10-CM | POA: Diagnosis present

## 2019-12-10 DIAGNOSIS — Z7982 Long term (current) use of aspirin: Secondary | ICD-10-CM | POA: Diagnosis not present

## 2019-12-10 DIAGNOSIS — K859 Acute pancreatitis without necrosis or infection, unspecified: Secondary | ICD-10-CM | POA: Diagnosis present

## 2019-12-10 DIAGNOSIS — R6882 Decreased libido: Secondary | ICD-10-CM | POA: Diagnosis present

## 2019-12-10 DIAGNOSIS — K863 Pseudocyst of pancreas: Secondary | ICD-10-CM | POA: Diagnosis not present

## 2019-12-10 DIAGNOSIS — F17290 Nicotine dependence, other tobacco product, uncomplicated: Secondary | ICD-10-CM | POA: Diagnosis present

## 2019-12-10 DIAGNOSIS — F10230 Alcohol dependence with withdrawal, uncomplicated: Secondary | ICD-10-CM | POA: Diagnosis present

## 2019-12-10 DIAGNOSIS — I2510 Atherosclerotic heart disease of native coronary artery without angina pectoris: Secondary | ICD-10-CM | POA: Diagnosis present

## 2019-12-10 DIAGNOSIS — Z79899 Other long term (current) drug therapy: Secondary | ICD-10-CM | POA: Diagnosis not present

## 2019-12-10 DIAGNOSIS — E1165 Type 2 diabetes mellitus with hyperglycemia: Secondary | ICD-10-CM | POA: Diagnosis not present

## 2019-12-10 DIAGNOSIS — F329 Major depressive disorder, single episode, unspecified: Secondary | ICD-10-CM | POA: Diagnosis present

## 2019-12-10 DIAGNOSIS — Z8249 Family history of ischemic heart disease and other diseases of the circulatory system: Secondary | ICD-10-CM | POA: Diagnosis not present

## 2019-12-10 DIAGNOSIS — Z20822 Contact with and (suspected) exposure to covid-19: Secondary | ICD-10-CM | POA: Diagnosis present

## 2019-12-10 DIAGNOSIS — Z8719 Personal history of other diseases of the digestive system: Secondary | ICD-10-CM | POA: Diagnosis not present

## 2019-12-10 DIAGNOSIS — E785 Hyperlipidemia, unspecified: Secondary | ICD-10-CM | POA: Diagnosis present

## 2019-12-10 DIAGNOSIS — F17210 Nicotine dependence, cigarettes, uncomplicated: Secondary | ICD-10-CM | POA: Diagnosis present

## 2019-12-10 DIAGNOSIS — Z8349 Family history of other endocrine, nutritional and metabolic diseases: Secondary | ICD-10-CM | POA: Diagnosis not present

## 2019-12-10 DIAGNOSIS — Z7984 Long term (current) use of oral hypoglycemic drugs: Secondary | ICD-10-CM | POA: Diagnosis not present

## 2019-12-10 DIAGNOSIS — Z833 Family history of diabetes mellitus: Secondary | ICD-10-CM | POA: Diagnosis not present

## 2019-12-10 DIAGNOSIS — R109 Unspecified abdominal pain: Secondary | ICD-10-CM | POA: Diagnosis present

## 2019-12-10 DIAGNOSIS — Z955 Presence of coronary angioplasty implant and graft: Secondary | ICD-10-CM | POA: Diagnosis not present

## 2019-12-10 DIAGNOSIS — I10 Essential (primary) hypertension: Secondary | ICD-10-CM | POA: Diagnosis present

## 2019-12-10 DIAGNOSIS — F419 Anxiety disorder, unspecified: Secondary | ICD-10-CM | POA: Diagnosis present

## 2019-12-10 LAB — BASIC METABOLIC PANEL
Anion gap: 12 (ref 5–15)
BUN: 8 mg/dL (ref 6–20)
CO2: 27 mmol/L (ref 22–32)
Calcium: 9.2 mg/dL (ref 8.9–10.3)
Chloride: 96 mmol/L — ABNORMAL LOW (ref 98–111)
Creatinine, Ser: 0.71 mg/dL (ref 0.61–1.24)
GFR calc Af Amer: >60 mL/min (ref >60)
GFR calc non Af Amer: >60 mL/min (ref >60)
Glucose, Bld: 152 mg/dL — ABNORMAL HIGH (ref 70–99)
Potassium: 4.5 mmol/L (ref 3.5–5.1)
Sodium: 135 mmol/L (ref 135–145)

## 2019-12-10 LAB — GLUCOSE, CAPILLARY
Glucose-Capillary: 147 mg/dL — ABNORMAL HIGH (ref 70–99)
Glucose-Capillary: 162 mg/dL — ABNORMAL HIGH (ref 70–99)
Glucose-Capillary: 149 mg/dL — ABNORMAL HIGH (ref 70–99)
Glucose-Capillary: 220 mg/dL — ABNORMAL HIGH (ref 70–99)
Glucose-Capillary: 207 mg/dL — ABNORMAL HIGH (ref 70–99)

## 2019-12-10 LAB — BETA-HYDROXYBUTYRIC ACID
Beta-Hydroxybutyric Acid: 2.28 mmol/L — ABNORMAL HIGH (ref 0.05–0.27)
Beta-Hydroxybutyric Acid: 1.7 mmol/L — ABNORMAL HIGH (ref 0.05–0.27)

## 2019-12-10 LAB — CBC
HCT: 41.7 % (ref 39.0–52.0)
Hemoglobin: 14.3 g/dL (ref 13.0–17.0)
MCH: 32.4 pg (ref 26.0–34.0)
MCHC: 34.3 g/dL (ref 30.0–36.0)
MCV: 94.3 fL (ref 80.0–100.0)
Platelets: 234 10*3/uL (ref 150–400)
RBC: 4.42 MIL/uL (ref 4.22–5.81)
RDW: 13.6 % (ref 11.5–15.5)
WBC: 14.5 10*3/uL — ABNORMAL HIGH (ref 4.0–10.5)
nRBC: 0 % (ref 0.0–0.2)

## 2019-12-10 LAB — LIPASE, BLOOD: Lipase: 51 U/L (ref 11–51)

## 2019-12-10 NOTE — Progress Notes (Signed)
PROGRESS NOTE  [name] [name] PJK:[id number] DOB: Jun 19, 1975 DOA: 12/09/2019 PCP: Denita Lung, MD  HPI/Recap of past 24 hours: This is a 44 year old male with a history of hypertension, type II diabetes, neurosyphilis (Tx in 2016) on Truvada for PrEP, alcoholic pancreatitis who presented to the ED with complaints of abdominal/flank pain x 7-8 days.   States that he recently received his first dose of testosterone injection roughly 2 weeks ago for low libido and low T, also started on Metformin roughly 2 weeks ago for newly diagnosed diabetes and recently switched from [place] to Truvada for PrEP about a month ago due to insurance changes.  He states his pain is intermittent and feels like spasms and is similar to prior pancreatitis episodes.  Also with intermittent diaphoresis.  Reports his last drink of alcohol was also about a week ago and drinks alcohol only every few weeks which is much less than he used to and denies any withdrawal symptoms.  Tylenol has not helped. Associated with nausea and vomiting. Seen at an urgent care 2 days ago and was told after reassuring workup that he should follow a bland diet and follow up with his PCP, however his symptoms have persisted/worsened.   ED Course: Tachycardic, afebrile and hemodynamically stable on room air. Notable labs: Na 128, Cl 90, CO2 20, Glucose 247, AG 18, Lipase 86, T bili 1.3, WBC 19.4, UA with 80 Ketones. CT abd/pelvis: acute pancreatitis with multiple pseudocysts. Given 1L NS bolus, Morphine, fentanyl and zofran.  12/10/19: Seen and examined.  Still has epigastric pain.  Poor appetite.  Seen by GI, appreciate recommendations.  Stable at this morning with T-max 100.6.  Assessment/Plan: Principal Problem:   Pancreatitis Active Problems:   Alcohol dependence with uncomplicated withdrawal ([place])   History of pancreatitis   Pancreatic pseudocyst   Acute pancreatitis  1. Acute pancreatitis with pseudocysts, likely multifactorial:  Medication induced and alcohol induced a. Recently switched from [place] to Truvada 1 month ago and chronically on lisinopril-HCTZ both of which can cause pancreatitis.  Also started Metformin and had his first testosterone injection and had his last alcoholic drink days prior to pancreatitis onset.  b. Lipase 86, T bili barely elevated 1.3, WBC 19.4K, downtrending. c. Triglycerides, normal. d. Hold home Metformin, Truvada, lisinopril-HCTZ e. N.p.o. except ice chips f. Continue IV fluids g. Continue antiemetics and pain control. h. GI consulted, appreciate recommendations.  2. Type 2 diabetes with hyperglycemia  resolved anion gap metabolic acidosis Serum bicarb 27 with a normal gap of 12 from 20 and 18, respectively. Continue IV fluid Hemoglobin A1c 8.8 on 11/15/2019.  3. SIRS in the setting of acute pancreatitis No evidence of active infective process, suspect reactive in the setting of acute pancreatitis with pseudocyst Leukocytosis is down-trending without antibiotics Had a T-max of 100.6 this morning Monitor fever curve and WBC  4. Resolved hypovolemic hyponatremia a. Continue normal saline IV infusion.   4. Alcohol use disorder. a. Has cut down his use to several times per week to skipping a few weeks of use b. Last drink about 1 week ago c. Continue to encourage alcohol cessation.  Essential hypertension/hyperlipidemia Stable Resume home regimen    DVT prophylaxis: Lovenox subcu daily   Code Status: Full code  Family Communication: None at bedside  Disposition Plan: Home when hemodynamically stable   Consultants:  GI  Procedures:  None  Antimicrobials:  None   Status is: Inpatient    Dispo:  Patient From: Home  Planned  Disposition: Home  Expected discharge date: 12/12/19  Medically stable for discharge: No, ongoing management of acute pancreatitis with pseudocyst.         Objective: Vitals:   12/09/19 2213 12/09/19 2347 12/10/19  0425 12/10/19 0749  BP: (!) 144/103 115/86 (!) 159/105 134/84  Pulse: (!) 110 (!) 105 (!) 103 (!) 118  Resp: 16 18 19 16   Temp: 99.9 F (37.7 C) 99.3 F (37.4 C) 98.9 F (37.2 C) (!) 100.6 F (38.1 C)  TempSrc: Oral Oral Oral Oral  SpO2: 98% 99% 100% 97%  Weight:      Height:        Intake/Output Summary (Last 24 hours) at 12/10/2019 1401 Last data filed at 12/10/2019 1016 Gross per 24 hour  Intake 2893.05 ml  Output 900 ml  Net 1993.05 ml   Filed Weights   12/09/19 0224  Weight: 55.3 kg    Exam:  . General: 44 y.o. year-old male well developed well nourished in no acute distress.  Alert and oriented x3. . Cardiovascular: Regular rate and rhythm with no rubs or gallops.  No thyromegaly or JVD noted.   Marland Kitchen Respiratory: Clear to auscultation with no wheezes or rales. Good inspiratory effort. . Abdomen: Epigastric tenderness with palpation.  Bowel sounds present.  Musculoskeletal: No lower extremity edema. 2/4 pulses in all 4 extremities. Marland Kitchen Psychiatry: Mood is appropriate for condition and setting   Data Reviewed: CBC: Recent Labs  Lab 12/07/19 1743 12/09/19 0224 12/10/19 0414  WBC 12.5* 19.4* 14.5*  HGB 14.8 16.4 14.3  HCT 43.0 46.4 41.7  MCV 93.1 90.4 94.3  PLT 316 331 784   Basic Metabolic Panel: Recent Labs  Lab 12/07/19 1743 12/09/19 0224 12/09/19 1209 12/10/19 0414  NA 130* 128*  --  135  K 4.1 3.6  --  4.5  CL 92* 90*  --  96*  CO2 28 20*  --  27  GLUCOSE 274* 247*  --  152*  BUN 6 10  --  8  CREATININE 0.77 0.86  --  0.71  CALCIUM 9.7 9.4  --  9.2  MG  --   --  1.4*  --    GFR: Estimated Creatinine Clearance: 90.2 mL/min (by C-G formula based on SCr of 0.71 mg/dL). Liver Function Tests: Recent Labs  Lab 12/07/19 1743 12/09/19 0224  AST 16 18  ALT 14 14  ALKPHOS 70 80  BILITOT 0.8 1.3*  PROT 6.6 7.8  ALBUMIN 3.2* 3.7   Recent Labs  Lab 12/07/19 1743 12/09/19 0224 12/10/19 0414  LIPASE 37 86* 51   No results for input(s): AMMONIA  in the last 168 hours. Coagulation Profile: No results for input(s): INR, PROTIME in the last 168 hours. Cardiac Enzymes: No results for input(s): CKTOTAL, CKMB, CKMBINDEX, TROPONINI in the last 168 hours. BNP (last 3 results) No results for input(s): PROBNP in the last 8760 hours. HbA1C: No results for input(s): HGBA1C in the last 72 hours. CBG: Recent Labs  Lab 12/09/19 2044 12/09/19 2351 12/10/19 0421 12/10/19 0747 12/10/19 1200  GLUCAP 182* 175* 147* 162* 149*   Lipid Profile: Recent Labs    12/09/19 1230  CHOL 95  HDL 31*  LDLCALC 53  TRIG 53  CHOLHDL 3.1   Thyroid Function Tests: No results for input(s): TSH, T4TOTAL, FREET4, T3FREE, THYROIDAB in the last 72 hours. Anemia Panel: No results for input(s): VITAMINB12, FOLATE, FERRITIN, TIBC, IRON, RETICCTPCT in the last 72 hours. Urine analysis:    Component Value Date/Time  COLORURINE YELLOW 12/09/2019 0224   APPEARANCEUR HAZY (A) 12/09/2019 0224   LABSPEC 1.024 12/09/2019 0224   PHURINE 5.0 12/09/2019 0224   GLUCOSEU >=500 (A) 12/09/2019 0224   HGBUR NEGATIVE 12/09/2019 0224   BILIRUBINUR NEGATIVE 12/09/2019 0224   BILIRUBINUR moderate (A) 10/27/2017 1654   BILIRUBINUR NEG 05/08/2014 1011   KETONESUR 80 (A) 12/09/2019 0224   PROTEINUR 100 (A) 12/09/2019 0224   UROBILINOGEN 0.2 12/07/2019 1720   NITRITE NEGATIVE 12/09/2019 0224   LEUKOCYTESUR NEGATIVE 12/09/2019 0224   Sepsis Labs: @LABRCNTIP (procalcitonin:4,lacticidven:4)  ) Recent Results (from the past 240 hour(s))  Respiratory Panel by RT PCR (Flu A&B, Covid) - Nasopharyngeal Swab     Status: None   Collection Time: 12/09/19  9:55 AM   Specimen: Nasopharyngeal Swab  Result Value Ref Range Status   SARS Coronavirus 2 by RT PCR NEGATIVE NEGATIVE Final    Comment: (NOTE) SARS-CoV-2 target nucleic acids are NOT DETECTED.  The SARS-CoV-2 RNA is generally detectable in upper respiratoy specimens during the acute phase of infection. The  lowest concentration of SARS-CoV-2 viral copies this assay can detect is 131 copies/mL. A negative result does not preclude SARS-Cov-2 infection and should not be used as the sole basis for treatment or other patient management decisions. A negative result may occur with  improper specimen collection/handling, submission of specimen other than nasopharyngeal swab, presence of viral mutation(s) within the areas targeted by this assay, and inadequate number of viral copies (<131 copies/mL). A negative result must be combined with clinical observations, patient history, and epidemiological information. The expected result is Negative.  Fact Sheet for Patients:  PinkCheek.be  Fact Sheet for Healthcare Providers:  GravelBags.it  This test is no t yet approved or cleared by the [place] FDA and  has been authorized for detection and/or diagnosis of SARS-CoV-2 by FDA under an Emergency Use Authorization (EUA). This EUA will remain  in effect (meaning this test can be used) for the duration of the COVID-19 declaration under Section 564(b)(1) of the Act, 21 U.S.C. section 360bbb-3(b)(1), unless the authorization is terminated or revoked sooner.     Influenza A by PCR NEGATIVE NEGATIVE Final   Influenza B by PCR NEGATIVE NEGATIVE Final    Comment: (NOTE) The Xpert Xpress SARS-CoV-2/FLU/RSV assay is intended as an aid in  the diagnosis of influenza from Nasopharyngeal swab specimens and  should not be used as a sole basis for treatment. Nasal washings and  aspirates are unacceptable for Xpert Xpress SARS-CoV-2/FLU/RSV  testing.  Fact Sheet for Patients: PinkCheek.be  Fact Sheet for Healthcare Providers: GravelBags.it  This test is not yet approved or cleared by the [place] FDA and  has been authorized for detection and/or diagnosis of SARS-CoV-2 by  FDA under  an Emergency Use Authorization (EUA). This EUA will remain  in effect (meaning this test can be used) for the duration of the  Covid-19 declaration under Section 564(b)(1) of the Act, 21  U.S.C. section 360bbb-3(b)(1), unless the authorization is  terminated or revoked. Performed at [facility], [place] [address], [place], [place] [address]       Studies: No results found.  Scheduled Meds: . aspirin EC  81 mg Oral Daily  . atorvastatin  40 mg Oral Daily  . enoxaparin (LOVENOX) injection  40 mg Subcutaneous Q24H  . metoprolol tartrate  25 mg Oral BID    Continuous Infusions: . lactated ringers 150 mL/hr at 12/10/19 0528     LOS: 0 days  Kayleen Memos, MD [facility] Pager [phone]  If 7PM-7AM, please contact night-coverage [email or web address] Password [email or web address] 12/10/2019, 2:01 PM

## 2019-12-10 NOTE — Consult Note (Signed)
Subjective:   HPI  The patient is a 44 year old male who was admitted to the hospital with several days of ongoing and increasing upper abdominal pain.  He was discovered by CT scan to have evidence of acute pancreatitis with multiple fluid collections consistent with pseudocyst formation.  He does give a history of drinking alcohol but has stated that he has decreased his consumption.  His lipase yesterday was 86 and today is 51.  He states his abdominal pain is better today.  He has had pancreatitis in the past.    Past Medical History:  Diagnosis Date  . Anxiety   . Coronary artery disease    a. 05/2014 OOH MI/PCI: LM nl, LAD 40p, 99p (3.0x28 Synergy DES) w/ R->L Collaterals, LCX nl, RCA 30p/d, EF 55% w/ sev distal inf/ant HK.  . Depression   . Diabetes mellitus without complication ([place])   . Erectile dysfunction   . H/O echocardiogram    a. 05/2014 Echo: Ef 45-50%, sev mid antsept/ant HK.  Marland Kitchen Hyperlipidemia   . Hypertension    medication in 2012, stress as a big component  . 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    Marland Kitchen Pancreatitis 10/2018  . Tobacco abuse    Past Surgical History:  Procedure Laterality Date  . CORONARY STENT PLACEMENT  05/24/2014   MID LAD DES   . LEFT HEART CATHETERIZATION WITH CORONARY ANGIOGRAM N/A 05/24/2014   Procedure: LEFT HEART CATHETERIZATION WITH CORONARY ANGIOGRAM;  Surgeon: Jettie Booze, MD;  Location: [facility] CATH LAB;  Service: Cardiovascular;  Laterality: N/A;  . PERCUTANEOUS CORONARY STENT INTERVENTION (PCI-S)  05/24/2014   Procedure: PERCUTANEOUS CORONARY STENT INTERVENTION (PCI-S);  Surgeon: Jettie Booze, MD;  Location: [facility] CATH LAB;  Service: Cardiovascular;;  . PILONIDAL CYST EXCISION  1995   Social History   Socioeconomic History  . Marital status: Single    Spouse name: Not on file  . Number of children: Not on file  . Years of education: Not on file  . Highest education level: Not on file  Occupational History  .  Occupation: Secondary school teacher  Tobacco Use  . Smoking status: Current Every Day Smoker    Packs/day: 1.00    Years: 23.00    Pack years: 23.00    Types: Cigarettes  . Smokeless tobacco: Never Used  . Tobacco comment: currently smoking 0.5 ppd, declines patch  Vaping Use  . Vaping Use: Never used  Substance and Sexual Activity  . Alcohol use: Yes    Alcohol/week: 24.0 standard drinks    Types: 24 Cans of beer per week    Comment: 1/5-3/4 Fifth, 4-5 times a week.  +Guilty, cutting down; 2/4 CAGE questions.  . Drug use: No  . Sexual activity: Yes    Partners: Male  Other Topics Concern  . Not on file  Social History Narrative   Lives at home with roommate, exercise at gym in apartment complex, diet - no discretion.       Social Determinants of Health   Financial Resource Strain:   . Difficulty of Paying Living Expenses: Not on file  Food Insecurity:   . Worried About Charity fundraiser in the Last Year: Not on file  . Ran Out of Food in the Last Year: Not on file  Transportation Needs:   . Lack of Transportation (Medical): Not on file  . Lack of Transportation (Non-Medical): Not on file  Physical Activity:   . Days of Exercise per Week:  Not on file  . Minutes of Exercise per Session: Not on file  Stress:   . Feeling of Stress : Not on file  Social Connections:   . Frequency of Communication with Friends and Family: Not on file  . Frequency of Social Gatherings with Friends and Family: Not on file  . Attends Religious Services: Not on file  . Active Member of Clubs or Organizations: Not on file  . Attends Archivist Meetings: Not on file  . Marital Status: Not on file  Intimate Partner Violence:   . Fear of Current or Ex-Partner: Not on file  . Emotionally Abused: Not on file  . Physically Abused: Not on file  . Sexually Abused: Not on file   family history includes Diabetes in his father and maternal grandmother; Diabetes type II in his father;  Heart disease (age of onset: 64) in his father; Hypertension in his maternal grandfather and paternal grandfather; Peripheral Artery Disease in his father; Thyroid disease in his mother.  Current Facility-Administered Medications:  .  acetaminophen (TYLENOL) tablet 650 mg, 650 mg, Oral, Q6H PRN **OR** acetaminophen (TYLENOL) suppository 650 mg, 650 mg, Rectal, Q6H PRN, Harold Hedge, MD .  aspirin EC tablet 81 mg, 81 mg, Oral, Daily, Harold Hedge, MD, 81 mg at 12/10/19 5102 .  atorvastatin (LIPITOR) tablet 40 mg, 40 mg, Oral, Daily, Harold Hedge, MD, 40 mg at 12/10/19 5852 .  enoxaparin (LOVENOX) injection 40 mg, 40 mg, Subcutaneous, Q24H, Harold Hedge, MD, 40 mg at 12/09/19 2015 .  hydrALAZINE (APRESOLINE) injection 5 mg, 5 mg, Intravenous, Q6H PRN, Harold Hedge, MD .  lactated ringers infusion, , Intravenous, Continuous, Rise Patience, MD, Last Rate: 150 mL/hr at 12/10/19 0528, New Bag at 12/10/19 0528 .  metoprolol tartrate (LOPRESSOR) tablet 25 mg, 25 mg, Oral, BID, Harold Hedge, MD, 25 mg at 12/10/19 7782 .  morphine 2 MG/ML injection 1 mg, 1 mg, Intravenous, Q2H PRN, Harold Hedge, MD, 1 mg at 12/10/19 1140 .  ondansetron (ZOFRAN) tablet 4 mg, 4 mg, Oral, Q6H PRN **OR** ondansetron (ZOFRAN) injection 4 mg, 4 mg, Intravenous, Q6H PRN, Harold Hedge, MD Allergies  Allergen Reactions  . Gadolinium Derivatives Nausea Only    MRI dye     Objective:     BP 134/84 (BP Location: Right Arm)   Pulse (!) 118   Temp (!) 100.6 F (38.1 C) (Oral)   Resp 16   Ht 5' 4"  (1.626 m)   Wt 55.3 kg   SpO2 97%   BMI 20.94 kg/m   Alert and oriented  No acute distress  Heart regular rhythm no murmurs  Lungs clear  Abdomen: Bowel sounds present, soft, there is some tenderness in the upper mid abdomen but no rebound or guarding  Laboratory No components found for: D1    Assessment:     Acute pancreatitis with pseudocyst formation      Plan:     Supportive care.  IV  fluids.  Analgesics as needed.  Follow clinically on a daily basis.  N.p.o. initially then advance slowly as tolerated.  Further decisions on other means of enteral nutrition will depend on how he progresses over the next couple of days.  Further management of pseudocyst will require repeat imaging in the future.

## 2019-12-11 LAB — CBC WITH DIFFERENTIAL/PLATELET
Abs Immature Granulocytes: 0.04 10*3/uL (ref 0.00–0.07)
Basophils Absolute: 0 10*3/uL (ref 0.0–0.1)
Basophils Relative: 0 %
Eosinophils Absolute: 0 10*3/uL (ref 0.0–0.5)
Eosinophils Relative: 0 %
HCT: 35.9 % — ABNORMAL LOW (ref 39.0–52.0)
Hemoglobin: 12.2 g/dL — ABNORMAL LOW (ref 13.0–17.0)
Immature Granulocytes: 1 %
Lymphocytes Relative: 11 %
Lymphs Abs: 1 10*3/uL (ref 0.7–4.0)
MCH: 31.4 pg (ref 26.0–34.0)
MCHC: 34 g/dL (ref 30.0–36.0)
MCV: 92.5 fL (ref 80.0–100.0)
Monocytes Absolute: 0.8 10*3/uL (ref 0.1–1.0)
Monocytes Relative: 10 %
Neutro Abs: 6.7 10*3/uL (ref 1.7–7.7)
Neutrophils Relative %: 78 %
Platelets: 181 10*3/uL (ref 150–400)
RBC: 3.88 MIL/uL — ABNORMAL LOW (ref 4.22–5.81)
RDW: 13.7 % (ref 11.5–15.5)
WBC: 8.6 10*3/uL (ref 4.0–10.5)
nRBC: 0 % (ref 0.0–0.2)

## 2019-12-11 LAB — COMPREHENSIVE METABOLIC PANEL
ALT: 9 U/L (ref 0–44)
AST: 12 U/L — ABNORMAL LOW (ref 15–41)
Albumin: 2.3 g/dL — ABNORMAL LOW (ref 3.5–5.0)
Alkaline Phosphatase: 56 U/L (ref 38–126)
Anion gap: 10 (ref 5–15)
BUN: 8 mg/dL (ref 6–20)
CO2: 25 mmol/L (ref 22–32)
Calcium: 8.4 mg/dL — ABNORMAL LOW (ref 8.9–10.3)
Chloride: 97 mmol/L — ABNORMAL LOW (ref 98–111)
Creatinine, Ser: 0.5 mg/dL — ABNORMAL LOW (ref 0.61–1.24)
GFR calc Af Amer: >60 mL/min (ref >60)
GFR calc non Af Amer: >60 mL/min (ref >60)
Glucose, Bld: 188 mg/dL — ABNORMAL HIGH (ref 70–99)
Potassium: 3.6 mmol/L (ref 3.5–5.1)
Sodium: 132 mmol/L — ABNORMAL LOW (ref 135–145)
Total Bilirubin: 1.2 mg/dL (ref 0.3–1.2)
Total Protein: 5.5 g/dL — ABNORMAL LOW (ref 6.5–8.1)

## 2019-12-11 LAB — GLUCOSE, CAPILLARY
Glucose-Capillary: 147 mg/dL — ABNORMAL HIGH (ref 70–99)
Glucose-Capillary: 161 mg/dL — ABNORMAL HIGH (ref 70–99)
Glucose-Capillary: 190 mg/dL — ABNORMAL HIGH (ref 70–99)
Glucose-Capillary: 210 mg/dL — ABNORMAL HIGH (ref 70–99)
Glucose-Capillary: 227 mg/dL — ABNORMAL HIGH (ref 70–99)
Glucose-Capillary: 310 mg/dL — ABNORMAL HIGH (ref 70–99)

## 2019-12-11 LAB — LIPASE, BLOOD: Lipase: 34 U/L (ref 11–51)

## 2019-12-11 LAB — HEMOGLOBIN A1C
Hgb A1c MFr Bld: 10.2 % — ABNORMAL HIGH (ref 4.8–5.6)
Mean Plasma Glucose: 246.04 mg/dL

## 2019-12-11 MED ORDER — THIAMINE HCL 100 MG PO TABS
250.0000 mg | ORAL_TABLET | Freq: Every day | ORAL | Status: DC
  Administered 2019-12-11 – 2019-12-12 (×2): 250 mg via ORAL
  Filled 2019-12-11 (×2): qty 3

## 2019-12-11 MED ORDER — VITAMIN B-12 1000 MCG PO TABS
1000.0000 ug | ORAL_TABLET | Freq: Every day | ORAL | Status: DC
  Administered 2019-12-11 – 2019-12-12 (×2): 1000 ug via ORAL
  Filled 2019-12-11 (×2): qty 1

## 2019-12-11 MED ORDER — ADULT MULTIVITAMIN W/MINERALS CH
1.0000 | ORAL_TABLET | Freq: Every day | ORAL | Status: DC
  Administered 2019-12-11 – 2019-12-12 (×2): 1 via ORAL
  Filled 2019-12-11 (×2): qty 1

## 2019-12-11 MED ORDER — INSULIN GLARGINE 100 UNIT/ML ~~LOC~~ SOLN
3.0000 [IU] | Freq: Every day | SUBCUTANEOUS | Status: DC
  Administered 2019-12-11 – 2019-12-12 (×2): 3 [IU] via SUBCUTANEOUS
  Filled 2019-12-11 (×2): qty 0.03

## 2019-12-11 MED ORDER — INSULIN ASPART 100 UNIT/ML ~~LOC~~ SOLN
0.0000 [IU] | Freq: Three times a day (TID) | SUBCUTANEOUS | Status: DC
  Administered 2019-12-11: 7 [IU] via SUBCUTANEOUS
  Administered 2019-12-12: 2 [IU] via SUBCUTANEOUS

## 2019-12-11 MED ORDER — INSULIN ASPART 100 UNIT/ML ~~LOC~~ SOLN
3.0000 [IU] | Freq: Three times a day (TID) | SUBCUTANEOUS | Status: DC
  Administered 2019-12-11 – 2019-12-12 (×2): 3 [IU] via SUBCUTANEOUS

## 2019-12-11 MED ORDER — ALPRAZOLAM 0.5 MG PO TABS: 0.5000 mg | ORAL_TABLET | Freq: Every day | ORAL | Status: DC | PRN

## 2019-12-11 MED ORDER — FOLIC ACID 1 MG PO TABS
1.0000 mg | ORAL_TABLET | Freq: Every day | ORAL | Status: DC
  Administered 2019-12-11 – 2019-12-12 (×2): 1 mg via ORAL
  Filled 2019-12-11 (×2): qty 1

## 2019-12-11 MED ORDER — POTASSIUM CHLORIDE IN NACL 20-0.9 MEQ/L-% IV SOLN
INTRAVENOUS | Status: DC
  Filled 2019-12-11 (×2): qty 1000

## 2019-12-11 MED ORDER — INSULIN ASPART 100 UNIT/ML ~~LOC~~ SOLN
0.0000 [IU] | Freq: Every day | SUBCUTANEOUS | Status: DC
  Administered 2019-12-11: 2 [IU] via SUBCUTANEOUS

## 2019-12-11 MED ORDER — EMTRICITABINE-TENOFOVIR AF 200-25 MG PO TABS: 1.0000 | ORAL_TABLET | Freq: Every day | ORAL | Status: DC

## 2019-12-11 MED ORDER — EMTRICITABINE-TENOFOVIR DF 200-300 MG PO TABS
1.0000 | ORAL_TABLET | Freq: Every day | ORAL | Status: DC
  Administered 2019-12-11 – 2019-12-12 (×2): 1 via ORAL
  Filled 2019-12-11 (×2): qty 1

## 2019-12-11 NOTE — Plan of Care (Signed)
  Problem: Education: Goal: Knowledge of General Education information will improve Description: Including pain rating scale, medication(s)/side effects and non-pharmacologic comfort measures Outcome: Progressing   Problem: Pain Managment: Goal: General experience of comfort will improve Outcome: Progressing   

## 2019-12-11 NOTE — Progress Notes (Signed)
[facility] Gastroenterology Progress Note  [name] [name] 44 y.o. Jul 05, 1975  CC:  Acute pancreatitis with pseudocyst formation  Subjective: Patient reports feeling well today.  Had some abdominal pain yesterday but has not had any abdominal pain today.  Had a soft diet approximately 20 minutes ago and has not had pain thus far.  States he has not had a bowel movement since Friday but continues to pass flatus.  ROS : Review of Systems  Cardiovascular: Negative for chest pain and palpitations.  Gastrointestinal: Negative for abdominal pain, blood in stool, constipation, diarrhea, heartburn, melena, nausea and vomiting.   Objective: Vital signs in last 24 hours: Vitals:   12/10/19 2054 12/11/19 0543  BP: (!) 136/94 (!) 129/92  Pulse: 99 84  Resp: 16 16  Temp: 98.7 F (37.1 C) 98.4 F (36.9 C)  SpO2: 97% 98%    Physical Exam:  General:  Alert, oriented, cooperative, no acute distress  Head:  Normocephalic, without obvious abnormality, atraumatic  Eyes:  Anicteric sclera, EOMs intact  Lungs:   Clear to auscultation bilaterally, respirations unlabored  Heart:  Regular rate and rhythm, S1, S2 normal  Abdomen:   Soft, non-tender, non-distended, bowel sounds active all four quadrants,  No guarding or peritoneal signs  Extremities: Extremities normal, atraumatic, no  edema  Pulses: 2+ and symmetric    Lab Results: Recent Labs    12/09/19 0224 12/09/19 1209 12/10/19 0414 12/11/19 0632  NA   < >  --  135 132*  K   < >  --  4.5 3.6  CL   < >  --  96* 97*  CO2   < >  --  27 25  GLUCOSE   < >  --  152* 188*  BUN   < >  --  8 8  CREATININE   < >  --  0.71 0.50*  CALCIUM   < >  --  9.2 8.4*  MG  --  1.4*  --   --    < > = values in this interval not displayed.   Recent Labs    12/09/19 0224 12/11/19 0632  AST 18 12*  ALT 14 9  ALKPHOS 80 56  BILITOT 1.3* 1.2  PROT 7.8 5.5*  ALBUMIN 3.7 2.3*   Recent Labs    12/10/19 0414 12/11/19 0632  WBC 14.5* 8.6  NEUTROABS  --   6.7  HGB 14.3 12.2*  HCT 41.7 35.9*  MCV 94.3 92.5  PLT 234 181   No results for input(s): LABPROT, INR in the last 72 hours.   Assessment: Acute pancreatitis with pseudocyst formation -WBCs now within normal limits (8.6) as compared to 14.5 yesterday and 19.42 days ago -Normal renal function (BUN 8/Cr 0.50) -Normal LFTs  Plan: Patient is improving clinically.  Patient has had for soft meal 20 minutes ago.  Monitor to see if patient's symptoms worsen after advanced diet.  Discussed importance of alcohol cessation with the patient who verbalized understanding.  Also recommended low-fat meals for at least several weeks.  In regard to pseudocyst formation, patient will require repeat CT imaging in approximately 4 weeks.  [facility] will follow.  Salley Slaughter PA-C 12/11/2019, 10:45 AM  Contact #  [phone]

## 2019-12-11 NOTE — Progress Notes (Addendum)
PROGRESS NOTE  Kory Rains MWU:[id number] DOB: Mar 22, 1975 DOA: 12/09/2019 PCP: Denita Lung, MD  HPI/Recap of past 24 hours: This is a 44 year old male with a history of hypertension, type II diabetes, neurosyphilis (Tx in 2016) on Truvada for pre exposure HIV?, alcoholic pancreatitis who presented to [facility] ED with complaints of abdominal/flank pain x 7-8 days.   States that he recently received his first dose of testosterone injection roughly 2 weeks ago for low libido, also started on Metformin roughly 2 weeks prior to presentation for newly diagnosed diabetes.  His pain is intermittent and feels like spasms and is similar to prior pancreatitis episodes.    Associated with nausea and vomiting.  Reports his last drink of alcohol was about a week prior.  Seen at an urgent care 2 days before he presented to the ED and was told after reassuring workup that he should follow a bland diet and follow up with his PCP, however his symptoms have worsened.   ED Course: Notable labs: Na 128, Cl 90, CO2 20, Glucose 247, AG 18, Lipase 86, T bili 1.3, WBC 19.4, UA with 80 Ketones. CT abd/pelvis: acute pancreatitis with multiple pseudocysts. Given 1L NS bolus, Morphine, fentanyl and zofran.  12/11/19: Seen and examined.  Epigastric pain is improved.  Denies nausea.  Requests his diet to be advanced to soft.  Seen by GI, appreciate recommendations.  Assessment/Plan: Principal Problem:   Pancreatitis Active Problems:   Alcohol dependence with uncomplicated withdrawal ([place])   History of pancreatitis   Pancreatic pseudocyst   Acute pancreatitis  Acute pancreatitis with pseudocysts, likely multifactorial: Medication induced and alcohol induced/Hx of prior pancreatitis a. Recently switched from Descovy to Truvada 1 month ago and chronically on lisinopril-HCTZ both of which can cause pancreatitis.  Also started Metformin and had his first testosterone injection and had his last alcoholic drink days prior to  pancreatitis onset.  b. Lipase 86, T bili barely elevated 1.3, WBC 19.4K, downtrending. c. Triglycerides, normal. d. Hold home Metformin, Truvada, lisinopril-HCTZ e. Diet advanced to soft on 12/11/2019 at patient's request f. Continue IV fluids g. Continue antiemetics and pain control. h. GI consulted, appreciate recommendations.  Uncontrolled type 2 diabetes with hyperglycemia Last hemoglobin A1c 8.8 on 11/15/2019 CBG greater than 300, repeat A1c Start Lantus 3 units, NovoLog 3 units 3 times daily, insulin sliding scale, sensitive Avoid hypoglycemia in the setting of poor oral intake Continue to monitor CBGs  Resolved SIRS in the setting of acute pancreatitis, likely reactive No evidence of active infective process, suspect reactive in the setting of acute pancreatitis with pseudocyst Leukocytosis is resolved without antibiotics. Afebrile.  Hypovolemic hyponatremia a. Continue IV hydration, normal saline KCl at 100 cc/h b. Continue to monitor serum sodium  Alcohol use disorder. a. Has cut down his use to several times per week to skipping a few weeks of use b. Last drink about 1 week ago c. Continue to encourage alcohol cessation. d. No evidence of alcohol withdrawal at the time of this exam e. Thiamine, folic acid and multivitamin.  Essential hypertension/hyperlipidemia BP is not at goal Resume home regimen Continue to monitor vital signs and adjust antihypertensives  Hx of syphillis/High risk sexual behavior? On Truvada for pre-exposure HIV prophylaxis? Pharmacy to verify if taking Truvada at home, then restart if taking at home    DVT prophylaxis: Lovenox subcu daily   Code Status: Full code  Family Communication: None at bedside  Disposition Plan: Home when hemodynamically stable   Consultants:  GI  Procedures:  None  Antimicrobials:  None   Status is: Inpatient    Dispo:  Patient From: Home  Planned Disposition: Home  Expected discharge  date: 12/12/19  Medically stable for discharge: No, ongoing management of acute pancreatitis with pseudocyst.         Objective: Vitals:   12/10/19 1400 12/10/19 2054 12/11/19 0543 12/11/19 1305  BP: (!) 127/91 (!) 136/94 (!) 129/92 (!) 150/99  Pulse: 97 99 84 83  Resp: 12 16 16 16   Temp: 98.6 F (37 C) 98.7 F (37.1 C) 98.4 F (36.9 C) 98.5 F (36.9 C)  TempSrc: Oral Oral Oral   SpO2: 100% 97% 98% 98%  Weight:      Height:        Intake/Output Summary (Last 24 hours) at 12/11/2019 1722 Last data filed at 12/11/2019 1636 Gross per 24 hour  Intake 1137.31 ml  Output 1000 ml  Net 137.31 ml   Filed Weights   12/09/19 0224  Weight: 55.3 kg    Exam:  . General: 44 y.o. year-old male pleasant well-developed well-nourished no acute distress.  Alert and oriented x3.   . Cardiovascular: Regular rate and rhythm no rubs or gallops.   Marland Kitchen Respiratory: Clear to auscultation no wheezes or rales.   . Abdomen: Epigastric tenderness improved bowel sounds present.  Musculoskeletal: No lower extremity edema bilaterally. Marland Kitchen Psychiatry: Mood is appropriate for condition and setting.  Data Reviewed: CBC: Recent Labs  Lab 12/07/19 1743 12/09/19 0224 12/10/19 0414 12/11/19 0632  WBC 12.5* 19.4* 14.5* 8.6  NEUTROABS  --   --   --  6.7  HGB 14.8 16.4 14.3 12.2*  HCT 43.0 46.4 41.7 35.9*  MCV 93.1 90.4 94.3 92.5  PLT 316 331 234 426   Basic Metabolic Panel: Recent Labs  Lab 12/07/19 1743 12/09/19 0224 12/09/19 1209 12/10/19 0414 12/11/19 0632  NA 130* 128*  --  135 132*  K 4.1 3.6  --  4.5 3.6  CL 92* 90*  --  96* 97*  CO2 28 20*  --  27 25  GLUCOSE 274* 247*  --  152* 188*  BUN 6 10  --  8 8  CREATININE 0.77 0.86  --  0.71 0.50*  CALCIUM 9.7 9.4  --  9.2 8.4*  MG  --   --  1.4*  --   --    GFR: Estimated Creatinine Clearance: 90.2 mL/min (A) (by C-G formula based on SCr of 0.5 mg/dL (L)). Liver Function Tests: Recent Labs  Lab 12/07/19 1743 12/09/19 0224  12/11/19 0632  AST 16 18 12*  ALT 14 14 9   ALKPHOS 70 80 56  BILITOT 0.8 1.3* 1.2  PROT 6.6 7.8 5.5*  ALBUMIN 3.2* 3.7 2.3*   Recent Labs  Lab 12/07/19 1743 12/09/19 0224 12/10/19 0414 12/11/19 0632  LIPASE 37 86* 51 34   No results for input(s): AMMONIA in the last 168 hours. Coagulation Profile: No results for input(s): INR, PROTIME in the last 168 hours. Cardiac Enzymes: No results for input(s): CKTOTAL, CKMB, CKMBINDEX, TROPONINI in the last 168 hours. BNP (last 3 results) No results for input(s): PROBNP in the last 8760 hours. HbA1C: No results for input(s): HGBA1C in the last 72 hours. CBG: Recent Labs  Lab 12/11/19 0013 12/11/19 0421 12/11/19 0741 12/11/19 1136 12/11/19 1709  GLUCAP 161* 147* 190* 227* 310*   Lipid Profile: Recent Labs    12/09/19 1230  CHOL 95  HDL 31*  LDLCALC 53  TRIG 53  CHOLHDL 3.1   Thyroid Function Tests: No results for input(s): TSH, T4TOTAL, FREET4, T3FREE, THYROIDAB in the last 72 hours. Anemia Panel: No results for input(s): VITAMINB12, FOLATE, FERRITIN, TIBC, IRON, RETICCTPCT in the last 72 hours. Urine analysis:    Component Value Date/Time   COLORURINE YELLOW 12/09/2019 0224   APPEARANCEUR HAZY (A) 12/09/2019 0224   LABSPEC 1.024 12/09/2019 0224   PHURINE 5.0 12/09/2019 0224   GLUCOSEU >=500 (A) 12/09/2019 0224   HGBUR NEGATIVE 12/09/2019 0224   BILIRUBINUR NEGATIVE 12/09/2019 0224   BILIRUBINUR moderate (A) 10/27/2017 1654   BILIRUBINUR NEG 05/08/2014 1011   KETONESUR 80 (A) 12/09/2019 0224   PROTEINUR 100 (A) 12/09/2019 0224   UROBILINOGEN 0.2 12/07/2019 1720   NITRITE NEGATIVE 12/09/2019 0224   LEUKOCYTESUR NEGATIVE 12/09/2019 0224   Sepsis Labs: @LABRCNTIP (procalcitonin:4,lacticidven:4)  ) Recent Results (from the past 240 hour(s))  Respiratory Panel by RT PCR (Flu A&B, Covid) - Nasopharyngeal Swab     Status: None   Collection Time: 12/09/19  9:55 AM   Specimen: Nasopharyngeal Swab  Result Value Ref  Range Status   SARS Coronavirus 2 by RT PCR NEGATIVE NEGATIVE Final    Comment: (NOTE) SARS-CoV-2 target nucleic acids are NOT DETECTED.  The SARS-CoV-2 RNA is generally detectable in upper respiratoy specimens during the acute phase of infection. The lowest concentration of SARS-CoV-2 viral copies this assay can detect is 131 copies/mL. A negative result does not preclude SARS-Cov-2 infection and should not be used as the sole basis for treatment or other patient management decisions. A negative result may occur with  improper specimen collection/handling, submission of specimen other than nasopharyngeal swab, presence of viral mutation(s) within the areas targeted by this assay, and inadequate number of viral copies (<131 copies/mL). A negative result must be combined with clinical observations, patient history, and epidemiological information. The expected result is Negative.  Fact Sheet for Patients:  PinkCheek.be  Fact Sheet for Healthcare Providers:  GravelBags.it  This test is no t yet approved or cleared by the [place] FDA and  has been authorized for detection and/or diagnosis of SARS-CoV-2 by FDA under an Emergency Use Authorization (EUA). This EUA will remain  in effect (meaning this test can be used) for the duration of the COVID-19 declaration under Section 564(b)(1) of the Act, 21 U.S.C. section 360bbb-3(b)(1), unless the authorization is terminated or revoked sooner.     Influenza A by PCR NEGATIVE NEGATIVE Final   Influenza B by PCR NEGATIVE NEGATIVE Final    Comment: (NOTE) The Xpert Xpress SARS-CoV-2/FLU/RSV assay is intended as an aid in  the diagnosis of influenza from Nasopharyngeal swab specimens and  should not be used as a sole basis for treatment. Nasal washings and  aspirates are unacceptable for Xpert Xpress SARS-CoV-2/FLU/RSV  testing.  Fact Sheet for  Patients: PinkCheek.be  Fact Sheet for Healthcare Providers: GravelBags.it  This test is not yet approved or cleared by the [place] FDA and  has been authorized for detection and/or diagnosis of SARS-CoV-2 by  FDA under an Emergency Use Authorization (EUA). This EUA will remain  in effect (meaning this test can be used) for the duration of the  Covid-19 declaration under Section 564(b)(1) of the Act, 21  U.S.C. section 360bbb-3(b)(1), unless the authorization is  terminated or revoked. Performed at [facility], [place] [address], [place], [place] [address]       Studies: No results found.  Scheduled Meds: . aspirin EC  81 mg  Oral Daily  . atorvastatin  40 mg Oral Daily  . enoxaparin (LOVENOX) injection  40 mg Subcutaneous Q24H  . insulin aspart  0-5 Units Subcutaneous QHS  . [START ON 12/12/2019] insulin aspart  0-9 Units Subcutaneous TID WC  . insulin aspart  3 Units Subcutaneous TID WC  . insulin glargine  3 Units Subcutaneous Daily  . metoprolol tartrate  25 mg Oral BID    Continuous Infusions:    LOS: 1 day     Kayleen Memos, MD [facility] Pager [phone]  If 7PM-7AM, please contact night-coverage [email or web address] Password [email or web address] 12/11/2019, 5:22 PM

## 2019-12-12 LAB — CBC WITH DIFFERENTIAL/PLATELET
Abs Immature Granulocytes: 0.03 10*3/uL (ref 0.00–0.07)
Basophils Absolute: 0 10*3/uL (ref 0.0–0.1)
Basophils Relative: 0 %
Eosinophils Absolute: 0 10*3/uL (ref 0.0–0.5)
Eosinophils Relative: 0 %
HCT: 36.2 % — ABNORMAL LOW (ref 39.0–52.0)
Hemoglobin: 12.2 g/dL — ABNORMAL LOW (ref 13.0–17.0)
Immature Granulocytes: 0 %
Lymphocytes Relative: 17 %
Lymphs Abs: 1.3 10*3/uL (ref 0.7–4.0)
MCH: 31.5 pg (ref 26.0–34.0)
MCHC: 33.7 g/dL (ref 30.0–36.0)
MCV: 93.5 fL (ref 80.0–100.0)
Monocytes Absolute: 0.8 10*3/uL (ref 0.1–1.0)
Monocytes Relative: 11 %
Neutro Abs: 5.5 10*3/uL (ref 1.7–7.7)
Neutrophils Relative %: 72 %
Platelets: 183 10*3/uL (ref 150–400)
RBC: 3.87 MIL/uL — ABNORMAL LOW (ref 4.22–5.81)
RDW: 13.5 % (ref 11.5–15.5)
WBC: 7.6 10*3/uL (ref 4.0–10.5)
nRBC: 0 % (ref 0.0–0.2)

## 2019-12-12 LAB — COMPREHENSIVE METABOLIC PANEL
ALT: 12 U/L (ref 0–44)
AST: 18 U/L (ref 15–41)
Albumin: 2.4 g/dL — ABNORMAL LOW (ref 3.5–5.0)
Alkaline Phosphatase: 63 U/L (ref 38–126)
Anion gap: 9 (ref 5–15)
BUN: 7 mg/dL (ref 6–20)
CO2: 24 mmol/L (ref 22–32)
Calcium: 8.2 mg/dL — ABNORMAL LOW (ref 8.9–10.3)
Chloride: 101 mmol/L (ref 98–111)
Creatinine, Ser: 0.61 mg/dL (ref 0.61–1.24)
GFR calc non Af Amer: >60 mL/min (ref >60)
Glucose, Bld: 208 mg/dL — ABNORMAL HIGH (ref 70–99)
Potassium: 3.6 mmol/L (ref 3.5–5.1)
Sodium: 134 mmol/L — ABNORMAL LOW (ref 135–145)
Total Bilirubin: 0.7 mg/dL (ref 0.3–1.2)
Total Protein: 5.4 g/dL — ABNORMAL LOW (ref 6.5–8.1)

## 2019-12-12 LAB — GLUCOSE, CAPILLARY: Glucose-Capillary: 180 mg/dL — ABNORMAL HIGH (ref 70–99)

## 2019-12-12 LAB — LIPASE, BLOOD: Lipase: 37 U/L (ref 11–51)

## 2019-12-12 MED ORDER — LISINOPRIL-HYDROCHLOROTHIAZIDE 20-12.5 MG PO TABS: 1.0000 | ORAL_TABLET | Freq: Every day | ORAL | Status: DC

## 2019-12-12 NOTE — Progress Notes (Signed)
[facility] Gastroenterology Progress Note  [name] [name] 44 y.o. 01-20-1976  CC:  Acute pancreatitis with pseudocyst formation  Subjective: Patient reports feeling well today and denies any abdominal pain.  Tolerated a soft diet today and yesterday. Had a bowel movement today. Denies nausea/vomiting.  ROS : Review of Systems  Cardiovascular: Negative for chest pain and palpitations.  Gastrointestinal: Negative for abdominal pain, blood in stool, constipation, diarrhea, heartburn, melena, nausea and vomiting.   Objective: Vital signs in last 24 hours: Vitals:   12/11/19 2051 12/12/19 0504  BP: (!) 153/99 (!) 150/110  Pulse: 94 71  Resp: 18 16  Temp: 98.8 F (37.1 C) 98.5 F (36.9 C)  SpO2: 98% 99%    Physical Exam:  General:  Alert, oriented, cooperative, no acute distress  Head:  Normocephalic, without obvious abnormality, atraumatic  Eyes:  Anicteric sclera, EOMs intact  Lungs:   Clear to auscultation bilaterally, respirations unlabored  Heart:  Regular rate and rhythm, S1, S2 normal  Abdomen:   Soft, non-tender, non-distended, bowel sounds active all four quadrants,  No guarding or peritoneal signs  Extremities: Extremities normal, atraumatic, no  edema  Pulses: 2+ and symmetric    Lab Results: Recent Labs    12/09/19 1209 12/10/19 0414 12/11/19 0632 12/12/19 0342  NA  --    < > 132* 134*  K  --    < > 3.6 3.6  CL  --    < > 97* 101  CO2  --    < > 25 24  GLUCOSE  --    < > 188* 208*  BUN  --    < > 8 7  CREATININE  --    < > 0.50* 0.61  CALCIUM  --    < > 8.4* 8.2*  MG 1.4*  --   --   --    < > = values in this interval not displayed.   Recent Labs    12/11/19 0632 12/12/19 0342  AST 12* 18  ALT 9 12  ALKPHOS 56 63  BILITOT 1.2 0.7  PROT 5.5* 5.4*  ALBUMIN 2.3* 2.4*   Recent Labs    12/11/19 0632 12/12/19 0342  WBC 8.6 7.6  NEUTROABS 6.7 5.5  HGB 12.2* 12.2*  HCT 35.9* 36.2*  MCV 92.5 93.5  PLT 181 183   No results for input(s): LABPROT, INR  in the last 72 hours.   Assessment: Acute pancreatitis with pseudocyst formation -WBCs continue to be within normal limits (7.6)  -Normal renal function (BUN 7/Cr 0.61) -Normal LFTs  Plan: OK to discharge from a GI standpoint.  Discussed importance of alcohol cessation with the patient who verbalized understanding.  Also recommended low-fat meals for at least several weeks.  We will call patient to arrange follow up and repeat CT imaging in approximately 4 weeks.  [facility] will sign off.  Thank you for the consultation. Please contact us if we can be of any further assistance during this hospital stay.  Salley Slaughter PA-C 12/12/2019, 10:27 AM  Contact #  [phone]

## 2019-12-12 NOTE — Progress Notes (Signed)
Pt alert and oriented, no complaints of pain, tolerating diet.  D/C orders given, d/cd to home.

## 2019-12-12 NOTE — Discharge Summary (Signed)
.   Physician Discharge Summary  [name] [name] SFK:[id number] DOB: [birth year] DOA: 12/09/2019  PCP: Denita Lung, MD  Admit date: 12/09/2019 Discharge date: 12/12/2019  Admitted From: Home  Discharge disposition: Home   Recommendations for Outpatient Follow-Up:   . Follow up with your primary care provider in one week.  . Check CBC, BMP, magnesium in the next visit . Follow-up with GI in 4 weeks for pseudopancreatic cyst follow-up with imaging.   Discharge Diagnosis:   Principal Problem:   Pancreatitis Active Problems:   Alcohol dependence with uncomplicated withdrawal ([place])   History of pancreatitis   Pancreatic pseudocyst   Acute pancreatitis  Discharge Condition: Improved.  Diet recommendation: low fat diet  Wound care: None.  Code status: Full.   History of Present Illness:   This is a 44 year old male with a history of hypertension, type II diabetes, neurosyphilis (Tx in 2016) on Truvada for pre exposure prophylaxis of HIV,alcoholicpancreatitis who presented to  hospital with complaints of abdominal pain and flank pain for 7 to 8 days.  Patient also stated that he recently received his first dose of testosterone injection  2 weeks ago for low libido, also started on Metformin roughly 2 weeks prior to presentation for newly diagnosed diabetes.  His pain was intermittent and feels like spasms and is similar to prior pancreatitis episodes.  Associated with nausea and vomiting.  Reported his last drink of alcohol was about a week prior.  In the ED, he was noted to be hyponatremic.  Lipase was 86.  Bilirubin was mildly elevated.  WBC of 19.4.  CT scan of the abdomen and pelvis showed acute pancreatitis with multiple pseudocyst.  Patient was given normal saline morphine Zofran and was admitted to hospital with GI consultation.   Hospital Course:   Following conditions were addressed during hospitalization as listed below,  Acute pancreatitis with pseudocysts,   Possibly secondary to medication induced/alcohol induced/prior history of pancreatitis.  Patient was started on new medication including Truvada and Metformin.  Was on lisinopril HCTZ at home on a chronic basis.  I have advised the patient to discuss this with his primary care physician to switch alternatives.  He was treated conservatively while in the hospital with IV fluids antiemetics.  GI followed the patient during hospitalization as well.  At this time GI has recommended outpatient follow-up with imaging for pseudocysts in 4 weeks.  Uncontrolled type 2 diabetes with hyperglycemia.  On presentation.Last hemoglobin A1c 8.8 on 11/15/2019.patient received a sliding scale insulin Accu-Cheks and long-acting insulin while in the hospital.  Will be resumed on Metformin on discharge.  SIRS.  Likely secondary to acute pancreatitis.  Has resolved.  Hypovolemic hyponatremia. Improved with IV fluids.  Alcohol use disorder. Emphasized on abstinence.  Essential hypertension/hyperlipidemia.  Resume home medications on discharge.  Hx of syphillis/ pre-exposure HIV prophylaxis.  On Truvada.  Disposition.  At this time, patient is stable for disposition home.  Medical Consultants:    GI  Procedures:    None Subjective:   Today, patient okay.  Denies any nausea, vomiting, abdominal pain.  Has tolerated oral diet.  Seen by GI.  Discharge Exam:   Vitals:   12/11/19 2051 12/12/19 0504  BP: (!) 153/99 (!) 150/110  Pulse: 94 71  Resp: 18 16  Temp: 98.8 F (37.1 C) 98.5 F (36.9 C)  SpO2: 98% 99%   Vitals:   12/11/19 0543 12/11/19 1305 12/11/19 2051 12/12/19 0504  BP: (!) 129/92 (!) 150/99 (!) 153/99 Marland Kitchen)  150/110  Pulse: 84 83 94 71  Resp: 16 16 18 16   Temp: 98.4 F (36.9 C) 98.5 F (36.9 C) 98.8 F (37.1 C) 98.5 F (36.9 C)  TempSrc: Oral  Oral Oral  SpO2: 98% 98% 98% 99%  Weight:      Height:       General: Alert awake, not in obvious distress HENT: pupils equally reacting to  light,  No scleral pallor or icterus noted. Oral mucosa is moist.  Chest:  Clear breath sounds.  Diminished breath sounds bilaterally. No crackles or wheezes.  CVS: S1 &S2 heard. No murmur.  Regular rate and rhythm. Abdomen: Soft, nontender, nondistended.  Bowel sounds are heard.   Extremities: No cyanosis, clubbing or edema.  Peripheral pulses are palpable. Psych: Alert, awake and oriented, normal mood CNS:  No cranial nerve deficits.  Power equal in all extremities.   Skin: Warm and dry.  No rashes noted.  The results of significant diagnostics from this hospitalization (including imaging, microbiology, ancillary and laboratory) are listed below for reference.     Diagnostic Studies:   CT ABDOMEN PELVIS W CONTRAST  Result Date: 12/09/2019 CLINICAL DATA:  Epigastric abdominal pain. EXAM: CT ABDOMEN AND PELVIS WITH CONTRAST TECHNIQUE: Multidetector CT imaging of the abdomen and pelvis was performed using the standard protocol following bolus administration of intravenous contrast. CONTRAST:  171m OMNIPAQUE IOHEXOL 300 MG/ML  SOLN COMPARISON:  October 20, 2018. FINDINGS: Lower chest: No acute abnormality. Hepatobiliary: No focal liver abnormality is seen. No gallstones, gallbladder wall thickening, or biliary dilatation. Pancreas: Multiple fluid collections of varying sizes are seen within and surrounding the pancreas most consistent with pancreatic pseudocyst. Moderate inflammatory changes are noted suggesting acute pancreatitis. Largest fluid collection measures 6.2 x 3.0 cm posterior to the distal stomach consistent with pseudocyst. 3.9 x 3.7 cm fluid collection is noted in the pancreatic body and tail consistent with pseudocyst. Multiple other smaller collections are noted. Spleen: Normal in size without focal abnormality. Adrenals/Urinary Tract: Adrenal glands are unremarkable. Kidneys are normal, without renal calculi, focal lesion, or hydronephrosis. Bladder is unremarkable. Stomach/Bowel:  Stomach is displaced anterior due to previously described pancreatic pseudocyst. Appendix appears normal. No evidence of bowel wall thickening, distention, or inflammatory changes. Vascular/Lymphatic: Aortic atherosclerosis. No enlarged abdominal or pelvic lymph nodes. Reproductive: Prostate is unremarkable. Other: No abdominal wall hernia or abnormality. No abdominopelvic ascites. Musculoskeletal: No acute or significant osseous findings. IMPRESSION: 1. Multiple fluid collections of varying sizes are seen within and surrounding the pancreas most consistent with pancreatic pseudocysts. Moderate inflammatory changes are noted suggesting acute pancreatitis. Largest fluid collection measures 6.2 x 3.0 cm posterior to the distal stomach consistent with pseudocyst. 2. Aortic atherosclerosis. Aortic Atherosclerosis (ICD10-I70.0). Electronically Signed   By: JMarijo ConceptionM.D.   On: 12/09/2019 09:47     Labs:   Basic Metabolic Panel: Recent Labs  Lab 12/07/19 1743 12/07/19 1743 12/09/19 0224 12/09/19 0224 12/09/19 1209 12/10/19 0414 12/10/19 0414 12/11/19 0632 12/12/19 0342  NA 130*  --  128*  --   --  135  --  132* 134*  K 4.1   < > 3.6   < >  --  4.5   < > 3.6 3.6  CL 92*  --  90*  --   --  96*  --  97* 101  CO2 28  --  20*  --   --  27  --  25 24  GLUCOSE 274*  --  247*  --   --  152*  --  188* 208*  BUN 6  --  10  --   --  8  --  8 7  CREATININE 0.77  --  0.86  --   --  0.71  --  0.50* 0.61  CALCIUM 9.7  --  9.4  --   --  9.2  --  8.4* 8.2*  MG  --   --   --   --  1.4*  --   --   --   --    < > = values in this interval not displayed.   GFR Estimated Creatinine Clearance: 90.2 mL/min (by C-G formula based on SCr of 0.61 mg/dL). Liver Function Tests: Recent Labs  Lab 12/07/19 1743 12/09/19 0224 12/11/19 0632 12/12/19 0342  AST 16 18 12* 18  ALT 14 14 9 12   ALKPHOS 70 80 56 63  BILITOT 0.8 1.3* 1.2 0.7  PROT 6.6 7.8 5.5* 5.4*  ALBUMIN 3.2* 3.7 2.3* 2.4*   Recent Labs  Lab  12/07/19 1743 12/09/19 0224 12/10/19 0414 12/11/19 0632 12/12/19 0342  LIPASE 37 86* 51 34 37   No results for input(s): AMMONIA in the last 168 hours. Coagulation profile No results for input(s): INR, PROTIME in the last 168 hours.  CBC: Recent Labs  Lab 12/07/19 1743 12/09/19 0224 12/10/19 0414 12/11/19 0632 12/12/19 0342  WBC 12.5* 19.4* 14.5* 8.6 7.6  NEUTROABS  --   --   --  6.7 5.5  HGB 14.8 16.4 14.3 12.2* 12.2*  HCT 43.0 46.4 41.7 35.9* 36.2*  MCV 93.1 90.4 94.3 92.5 93.5  PLT 316 331 234 181 183   Cardiac Enzymes: No results for input(s): CKTOTAL, CKMB, CKMBINDEX, TROPONINI in the last 168 hours. BNP: Invalid input(s): POCBNP CBG: Recent Labs  Lab 12/11/19 0741 12/11/19 1136 12/11/19 1709 12/11/19 2208 12/12/19 0727  GLUCAP 190* 227* 310* 210* 180*   D-Dimer No results for input(s): DDIMER in the last 72 hours. Hgb A1c Recent Labs    12/11/19 0632  HGBA1C 10.2*   Lipid Profile No results for input(s): CHOL, HDL, LDLCALC, TRIG, CHOLHDL, LDLDIRECT in the last 72 hours. Thyroid function studies No results for input(s): TSH, T4TOTAL, T3FREE, THYROIDAB in the last 72 hours.  Invalid input(s): FREET3 Anemia work up No results for input(s): VITAMINB12, FOLATE, FERRITIN, TIBC, IRON, RETICCTPCT in the last 72 hours. Microbiology Recent Results (from the past 240 hour(s))  Respiratory Panel by RT PCR (Flu A&B, Covid) - Nasopharyngeal Swab     Status: None   Collection Time: 12/09/19  9:55 AM   Specimen: Nasopharyngeal Swab  Result Value Ref Range Status   SARS Coronavirus 2 by RT PCR NEGATIVE NEGATIVE Final    Comment: (NOTE) SARS-CoV-2 target nucleic acids are NOT DETECTED.  The SARS-CoV-2 RNA is generally detectable in upper respiratoy specimens during the acute phase of infection. The lowest concentration of SARS-CoV-2 viral copies this assay can detect is 131 copies/mL. A negative result does not preclude SARS-Cov-2 infection and should not be  used as the sole basis for treatment or other patient management decisions. A negative result may occur with  improper specimen collection/handling, submission of specimen other than nasopharyngeal swab, presence of viral mutation(s) within the areas targeted by this assay, and inadequate number of viral copies (<131 copies/mL). A negative result must be combined with clinical observations, patient history, and epidemiological information. The expected result is Negative.  Fact Sheet for Patients:  PinkCheek.be  Fact Sheet for Healthcare Providers:  GravelBags.it  This test is no t yet approved or cleared by the [place] and  has been authorized for detection and/or diagnosis of SARS-CoV-2 by FDA under an Emergency Use Authorization (EUA). This EUA will remain  in effect (meaning this test can be used) for the duration of the COVID-19 declaration under Section 564(b)(1) of the Act, 21 U.S.C. section 360bbb-3(b)(1), unless the authorization is terminated or revoked sooner.     Influenza A by PCR NEGATIVE NEGATIVE Final   Influenza B by PCR NEGATIVE NEGATIVE Final    Comment: (NOTE) The Xpert Xpress SARS-CoV-2/FLU/RSV assay is intended as an aid in  the diagnosis of influenza from Nasopharyngeal swab specimens and  should not be used as a sole basis for treatment. Nasal washings and  aspirates are unacceptable for Xpert Xpress SARS-CoV-2/FLU/RSV  testing.  Fact Sheet for Patients: PinkCheek.be  Fact Sheet for Healthcare Providers: GravelBags.it  This test is not yet approved or cleared by the [place] FDA and  has been authorized for detection and/or diagnosis of SARS-CoV-2 by  FDA under an Emergency Use Authorization (EUA). This EUA will remain  in effect (meaning this test can be used) for the duration of the  Covid-19 declaration under Section  564(b)(1) of the Act, 21  U.S.C. section 360bbb-3(b)(1), unless the authorization is  terminated or revoked. Performed at [facility], [place] [address], [place], [place] [address]      Discharge Instructions:   Discharge Instructions    Call MD for:  persistant nausea and vomiting   Complete by: As directed    Call MD for:  severe uncontrolled pain   Complete by: As directed    Diet Heart   Complete by: As directed    Low fat diet   Discharge instructions   Complete by: As directed    Please follow up with your primary care physician in one week. No alcohol please. Discuss about your medications with your primary care provider (might need changes since can cause pancreatitis). Follow up with GI doctor in 4 weeks.   Increase activity slowly   Complete by: As directed      Allergies as of 12/12/2019      Reactions   Gadolinium Derivatives Nausea Only   MRI dye      Medication List    TAKE these medications   albuterol 108 (90 Base) MCG/ACT inhaler Commonly known as: VENTOLIN HFA Inhale 2 puffs into the lungs every 6 (six) hours as needed for wheezing or shortness of breath.   ALPRAZolam 0.5 MG tablet Commonly known as: XANAX TAKE ONE TABLET BY MOUTH DAILY AS NEEDED FOR ANXIETY What changed: See the new instructions.   aspirin 81 MG EC tablet Take 1 tablet (81 mg total) by mouth daily.   atorvastatin 40 MG tablet Commonly known as: LIPITOR TAKE ONE TABLET BY MOUTH DAILY AT 6PM What changed:   how much to take  how to take this  when to take this   emtricitabine-tenofovir 200-300 MG tablet Commonly known as: TRUVADA Take 1 tablet by mouth daily.   lisinopril-hydrochlorothiazide 20-12.5 MG tablet Commonly known as: Zestoretic Take 1 tablet by mouth daily. Rx for 40-25 mg daily What changed: how much to take   metFORMIN 500 MG tablet Commonly known as: GLUCOPHAGE Take 1 tablet (500 mg total) by mouth 2 (two) times daily with a meal.    metoprolol tartrate 50 MG tablet Commonly known as: LOPRESSOR Take 1 tablet (50 mg total)  by mouth 2 (two) times daily. What changed: how much to take   multivitamin with minerals Tabs tablet Take 1 tablet by mouth daily.   OneTouch Delica Lancets 59M Misc 1 each by Does not apply route daily. Check blood sugar once daily   OneTouch Verio test strip Generic drug: glucose blood Use as instructed to check blood sugar once daily.   testosterone cypionate 200 MG/ML injection Commonly known as: DEPOTESTOSTERONE CYPIONATE Inject 1 mL (200 mg total) into the muscle every 14 (fourteen) days.   vitamin B-12 1000 MCG tablet Commonly known as: CYANOCOBALAMIN Take 1,000 mcg by mouth daily.       Follow-up Information    Denita Lung, MD. Schedule an appointment as soon as possible for a visit in 1 week(s).   Specialty: Family Medicine Contact information: [place] [address] [phone]        Elouise Munroe, MD .   Specialties: Cardiology, Radiology Contact information: [address] [place] [address] [phone]        Wonda Horner, MD. Schedule an appointment as soon as possible for a visit in 1 week(s).   Specialty: Gastroenterology Why: 4 weeks, for CT scan and followup Contact information: [address] [place] [address] [phone]                Time coordinating discharge: 39 minutes  Signed:  Leman Martinek  [facility] 12/12/2019, 6:19 PM

## 2019-12-13 ENCOUNTER — Telehealth: Payer: Self-pay | Admitting: Internal Medicine

## 2019-12-13 NOTE — Telephone Encounter (Signed)
lvm for patient to return call to get follow up scheduled with Margaretann Loveless from recall list

## 2019-12-20 ENCOUNTER — Other Ambulatory Visit (INDEPENDENT_AMBULATORY_CARE_PROVIDER_SITE_OTHER): Payer: 59

## 2019-12-20 ENCOUNTER — Other Ambulatory Visit: Payer: Self-pay

## 2019-12-20 DIAGNOSIS — R7989 Other specified abnormal findings of blood chemistry: Secondary | ICD-10-CM

## 2019-12-21 ENCOUNTER — Other Ambulatory Visit: Payer: Self-pay | Admitting: Family Medicine

## 2019-12-21 MED ORDER — TESTOSTERONE CYPIONATE 200 MG/ML IM SOLN
200.0000 mg | Freq: Once | INTRAMUSCULAR | Status: AC
  Administered 2019-12-20: 200 mg via INTRAMUSCULAR

## 2019-12-26 ENCOUNTER — Ambulatory Visit: Payer: 59 | Admitting: Family Medicine

## 2019-12-26 ENCOUNTER — Other Ambulatory Visit: Payer: Self-pay

## 2019-12-26 ENCOUNTER — Encounter: Payer: Self-pay | Admitting: Family Medicine

## 2019-12-26 VITALS — BP 100/64 | HR 109 | Temp 98.3°F | Wt 126.8 lb

## 2019-12-26 DIAGNOSIS — Z7252 High risk homosexual behavior: Secondary | ICD-10-CM

## 2019-12-26 DIAGNOSIS — E109 Type 1 diabetes mellitus without complications: Secondary | ICD-10-CM

## 2019-12-26 DIAGNOSIS — Z8719 Personal history of other diseases of the digestive system: Secondary | ICD-10-CM | POA: Diagnosis not present

## 2019-12-26 DIAGNOSIS — F1010 Alcohol abuse, uncomplicated: Secondary | ICD-10-CM

## 2019-12-26 DIAGNOSIS — E291 Testicular hypofunction: Secondary | ICD-10-CM | POA: Diagnosis not present

## 2019-12-26 DIAGNOSIS — K863 Pseudocyst of pancreas: Secondary | ICD-10-CM | POA: Diagnosis not present

## 2019-12-26 NOTE — Progress Notes (Signed)
   Subjective:    Patient ID: [name] [name], male    DOB: June 19, 1975, 44 y.o.   MRN: [id number]  HPI He is here for posthospitalization visit.  He was admitted October 3 and sent home on a 6.  He did have evidence of pancreatitis as well as a pancreatic pseudocyst.  He has a previous history of pancreatitis and did cut back on his alcohol consumption but did not entirely quit.  He now recognizes the need to avoid alcohol.  He also has a history of diabetes with an initial hemoglobin A1c of 8.8 but in the hospital it did go higher.  Since leaving the hospital he states that his CBGs have been running in the 170-180 range and he has not been checking them after meals.  He was also recently started on testosterone injections.  He is getting injections every 2 weeks.  He has not had a chance to get follow-up on this.  Apparently there is some issue with insurance coverage. He also continues on Truvada and is getting this through the health department.   Review of Systems     Objective:   Physical Exam Alert and in no distress otherwise not examined.  The hospital record including H&P and discharge summary was reviewed.       Assessment & Plan:  History of pancreatitis  Hypogonadism in male  New onset of diabetes mellitus in pediatric patient [facility])  Pancreatic pseudocyst  Alcohol abuse  High risk homosexual behavior  Discussed the diagnosis of pancreatitis as well as pancreatic pseudocyst and the need to entirely avoid alcohol.  He will continue on his Truvada.  I will check into getting topical testosterone which will be easier to control.  He will continue on Metformin 500 twice daily.  I explained that hospitalization has contributed to his elevated blood sugars and now that he is in his normal environment, we will get a better idea of what is diabetes status is.  Plan to recheck all of this in early December.  He was comfortable with this.  Over 30 minutes spent discussing all these  issues with him.

## 2019-12-27 ENCOUNTER — Telehealth: Payer: Self-pay

## 2019-12-27 ENCOUNTER — Other Ambulatory Visit: Payer: Self-pay

## 2019-12-27 DIAGNOSIS — E291 Testicular hypofunction: Secondary | ICD-10-CM

## 2019-12-27 LAB — CBC WITH DIFFERENTIAL/PLATELET
Basophils Absolute: 0 10*3/uL (ref 0.0–0.2)
Basos: 1 %
EOS (ABSOLUTE): 0.1 10*3/uL (ref 0.0–0.4)
Eos: 2 %
Hematocrit: 37.9 % (ref 37.5–51.0)
Hemoglobin: 12.6 g/dL — ABNORMAL LOW (ref 13.0–17.7)
Immature Grans (Abs): 0 10*3/uL (ref 0.0–0.1)
Immature Granulocytes: 0 %
Lymphocytes Absolute: 2.1 10*3/uL (ref 0.7–3.1)
Lymphs: 36 %
MCH: 31.2 pg (ref 26.6–33.0)
MCHC: 33.2 g/dL (ref 31.5–35.7)
MCV: 94 fL (ref 79–97)
Monocytes Absolute: 0.5 10*3/uL (ref 0.1–0.9)
Monocytes: 8 %
Neutrophils Absolute: 3 10*3/uL (ref 1.4–7.0)
Neutrophils: 53 %
Platelets: 304 10*3/uL (ref 150–450)
RBC: 4.04 x10E6/uL — ABNORMAL LOW (ref 4.14–5.80)
RDW: 13.4 % (ref 11.6–15.4)
WBC: 5.7 10*3/uL (ref 3.4–10.8)

## 2019-12-27 LAB — COMPREHENSIVE METABOLIC PANEL
ALT: 8 IU/L (ref 0–44)
AST: 10 IU/L (ref 0–40)
Albumin/Globulin Ratio: 1.5 (ref 1.2–2.2)
Albumin: 3.7 g/dL — ABNORMAL LOW (ref 4.0–5.0)
Alkaline Phosphatase: 84 IU/L (ref 44–121)
BUN/Creatinine Ratio: 7 — ABNORMAL LOW (ref 9–20)
BUN: 4 mg/dL — ABNORMAL LOW (ref 6–24)
Bilirubin Total: 0.2 mg/dL (ref 0.0–1.2)
CO2: 18 mmol/L — ABNORMAL LOW (ref 20–29)
Calcium: 8.9 mg/dL (ref 8.7–10.2)
Chloride: 98 mmol/L (ref 96–106)
Creatinine, Ser: 0.57 mg/dL — ABNORMAL LOW (ref 0.76–1.27)
GFR calc Af Amer: 142 mL/min/{1.73_m2} (ref >59)
GFR calc non Af Amer: 123 mL/min/{1.73_m2} (ref >59)
Globulin, Total: 2.5 g/dL (ref 1.5–4.5)
Glucose: 415 mg/dL — ABNORMAL HIGH (ref 65–99)
Potassium: 3.7 mmol/L (ref 3.5–5.2)
Sodium: 138 mmol/L (ref 134–144)
Total Protein: 6.2 g/dL (ref 6.0–8.5)

## 2019-12-27 NOTE — Telephone Encounter (Signed)
Per Dr. Redmond School will refer to [facility] for consultation per requirement for insurance company to cover testosterone topical,  Maudie Mercury will send

## 2019-12-31 ENCOUNTER — Telehealth: Payer: Self-pay | Admitting: Family Medicine

## 2019-12-31 ENCOUNTER — Other Ambulatory Visit: Payer: Self-pay | Admitting: Family Medicine

## 2019-12-31 DIAGNOSIS — F418 Other specified anxiety disorders: Secondary | ICD-10-CM

## 2019-12-31 NOTE — Telephone Encounter (Signed)
[facility] is requesting to fill pt xanax. Please advise [facility]

## 2019-12-31 NOTE — Telephone Encounter (Signed)
Pt came in and dropped off a form to be completed. Please call pt at [phone] when ready.

## 2020-01-03 ENCOUNTER — Telehealth: Payer: Self-pay | Admitting: Family Medicine

## 2020-01-03 MED ORDER — TESTOSTERONE CYPIONATE 200 MG/ML IM SOLN
200.0000 mg | INTRAMUSCULAR | 1 refills | Status: DC
Start: 2020-01-03 — End: 2020-02-08

## 2020-01-03 NOTE — Telephone Encounter (Signed)
Pt stopped by and needs refill on Testosterone sent to the [facility] [place]

## 2020-01-11 NOTE — Telephone Encounter (Signed)
Referral was sent.

## 2020-01-16 ENCOUNTER — Encounter: Payer: Self-pay | Admitting: Internal Medicine

## 2020-01-16 ENCOUNTER — Ambulatory Visit (INDEPENDENT_AMBULATORY_CARE_PROVIDER_SITE_OTHER): Payer: 59 | Admitting: Internal Medicine

## 2020-01-16 ENCOUNTER — Other Ambulatory Visit: Payer: Self-pay

## 2020-01-16 VITALS — BP 132/90 | HR 92 | Ht 64.0 in | Wt 122.0 lb

## 2020-01-16 DIAGNOSIS — I10 Essential (primary) hypertension: Secondary | ICD-10-CM

## 2020-01-16 DIAGNOSIS — G4719 Other hypersomnia: Secondary | ICD-10-CM | POA: Diagnosis not present

## 2020-01-16 DIAGNOSIS — E785 Hyperlipidemia, unspecified: Secondary | ICD-10-CM | POA: Diagnosis not present

## 2020-01-16 MED ORDER — METOPROLOL TARTRATE 50 MG PO TABS: 50.0000 mg | ORAL_TABLET | Freq: Two times a day (BID) | ORAL | 3 refills | Status: DC

## 2020-01-16 NOTE — Progress Notes (Addendum)
Cardiology Office Note:    Date:  01/16/2020   ID:  [name] [name], DOB [birth year], MRN [id number]  PCP:  Denita Lung, MD  Cardiologist:  Elouise Munroe, MD  Electrophysiologist:  None   Referring MD: Denita Lung, MD   Chief Complaint/Reason for Referral: Follow up HTN  History of Present Illness:    [name] [name] is a 44 y.o. 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 He was found to have a subtotal LAD which had occluded at the time of his intervention and he had a stent placed.  He also has a history of depression, hyperlipidemia, hypertension, prior neurosyphilis, pancreatitis with alcohol use, tobacco abuse.   He endorses daytime fatigue. He has not been screened for sleep apnea. This has been going on for several months if not longer.  No cp, sob. No palpitations. 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  Knows when he's dehydrated.   No syncope. Occasional positional lightheadedness.   Past Medical History:  Diagnosis Date   Anxiety    Coronary artery disease    a. 05/2014 OOH MI/PCI: LM nl, LAD 40p, 99p (3.0x28 Synergy DES) w/ R->L Collaterals, LCX nl, RCA 30p/d, EF 55% w/ sev distal inf/ant HK.   Depression    Diabetes mellitus without complication ([place])    Erectile dysfunction    H/O echocardiogram    a. 05/2014 Echo: Ef 45-50%, sev mid antsept/ant HK.   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     Pancreatitis 10/2018   Tobacco abuse     Past Surgical History:  Procedure Laterality Date   CORONARY STENT PLACEMENT  05/24/2014   MID LAD DES    LEFT HEART CATHETERIZATION WITH CORONARY ANGIOGRAM N/A 05/24/2014   Procedure: LEFT HEART CATHETERIZATION WITH CORONARY ANGIOGRAM;  Surgeon: Jettie Booze, MD;  Location: [facility] CATH LAB;  Service: Cardiovascular;  Laterality: N/A;   PERCUTANEOUS  CORONARY STENT INTERVENTION (PCI-S)  05/24/2014   Procedure: PERCUTANEOUS CORONARY STENT INTERVENTION (PCI-S);  Surgeon: Jettie Booze, MD;  Location: [facility] CATH LAB;  Service: Cardiovascular;;   PILONIDAL CYST EXCISION  1995    Current Medications: Current Meds  Medication Sig   albuterol (VENTOLIN HFA) 108 (90 Base) MCG/ACT inhaler Inhale 2 puffs into the lungs every 6 (six) hours as needed for wheezing or shortness of breath.   ALPRAZolam (XANAX) 0.5 MG tablet TAKE ONE TABLET BY MOUTH DAILY AS NEEDED FOR ANXIETY   aspirin EC 81 MG EC tablet Take 1 tablet (81 mg total) by mouth daily.   atorvastatin (LIPITOR) 40 MG tablet TAKE ONE TABLET BY MOUTH DAILY AT 6PM (Patient taking differently: Take 40 mg by mouth daily. TAKE ONE TABLET BY MOUTH DAILY AT 6PM)   emtricitabine-tenofovir (TRUVADA) 200-300 MG tablet Take 1 tablet by mouth daily.    glucose blood (ONETOUCH VERIO) test strip Use as instructed to check blood sugar once daily.   lisinopril-hydrochlorothiazide (ZESTORETIC) 20-12.5 MG tablet Take 1 tablet by mouth daily. Rx for 40-25 mg daily   metFORMIN (GLUCOPHAGE) 500 MG tablet Take 1 tablet (500 mg total) by mouth 2 (two) times daily with a meal.   metoprolol tartrate (LOPRESSOR) 50 MG tablet Take 1 tablet (50 mg total) by mouth 2 (two) times daily.   Multiple Vitamin (MULTIVITAMIN WITH MINERALS) TABS tablet Take 1  tablet by mouth daily.   OneTouch Delica Lancets 62U MISC 1 each by Does not apply route daily. Check blood sugar once daily   testosterone cypionate (DEPOTESTOSTERONE CYPIONATE) 200 MG/ML injection Inject 1 mL (200 mg total) into the muscle every 14 (fourteen) days.   vitamin B-12 (CYANOCOBALAMIN) 1000 MCG tablet Take 1,000 mcg by mouth daily.   [DISCONTINUED] metoprolol tartrate (LOPRESSOR) 25 MG tablet TAKE ONE TABLET BY MOUTH TWO TIMES A DAY     Allergies:   Gadolinium derivatives   Social History   Tobacco Use   Smoking status: Current Every Day Smoker     Packs/day: 1.00    Years: 23.00    Pack years: 23.00    Types: Cigarettes   Smokeless tobacco: Never Used   Tobacco comment: currently smoking 0.5 ppd, declines patch  Vaping Use   Vaping Use: Never used  Substance Use Topics   Alcohol use: Yes    Alcohol/week: 24.0 standard drinks    Types: 24 Cans of beer per week    Comment: 1/5-3/4 Fifth, 4-5 times a week.  +Guilty, cutting down; 2/4 CAGE questions.   Drug use: No     Family History: The patient's family history includes Diabetes in his father and maternal grandmother; Diabetes type II in his father; Heart disease (age of onset: 27) in his father; Hypertension in his maternal grandfather and paternal grandfather; Peripheral Artery Disease in his father; Thyroid disease in his mother. There is no history of Cancer or Stroke.  ROS:   Please see the history of present illness.    All other systems reviewed and are negative.  EKGs/Labs/Other Studies Reviewed:    The following studies were reviewed today:  EKG:  SR with left axis deviation and anterior infarct.  Recent Labs: 12/09/2019: Magnesium 1.4 12/26/2019: ALT 8; BUN 4; Creatinine, Ser 0.57; Hemoglobin 12.6; Platelets 304; Potassium 3.7; Sodium 138  Recent Lipid Panel    Component Value Date/Time   CHOL 95 12/09/2019 1230   CHOL 178 05/30/2019 0935   TRIG 53 12/09/2019 1230   HDL 31 (L) 12/09/2019 1230   HDL 73 05/30/2019 0935   CHOLHDL 3.1 12/09/2019 1230   VLDL 11 12/09/2019 1230   LDLCALC 53 12/09/2019 1230   LDLCALC 92 05/30/2019 0935    Physical Exam:    VS:  BP 132/90   Pulse 92   Ht 5' 4"  (1.626 m)   Wt 122 lb (55.3 kg)   SpO2 99%   BMI 20.94 kg/m     Wt Readings from Last 5 Encounters:  01/16/20 122 lb (55.3 kg)  12/26/19 126 lb 12.8 oz (57.5 kg)  12/09/19 122 lb (55.3 kg)  11/29/19 122 lb 3.2 oz (55.4 kg)  11/15/19 123 lb 12.8 oz (56.2 kg)    Constitutional: No acute distress Eyes: sclera non-icteric, normal conjunctiva and lids ENMT:  normal dentition, moist mucous membranes Cardiovascular: regular rhythm, normal rate, no murmurs. S1 and S2 normal. Radial pulses normal bilaterally. No jugular venous distention.  Respiratory: clear to auscultation bilaterally GI : normal bowel sounds, soft and nontender. No distention.   MSK: extremities warm, well perfused. No edema.  NEURO: grossly nonfocal exam, moves all extremities. PSYCH: alert and oriented x 3, normal mood and affect.   ASSESSMENT:    1. Excessive daytime sleepiness   2. Hyperlipidemia, unspecified hyperlipidemia type   3. Essential hypertension    PLAN:    Excessive daytime sleepiness - Plan: EKG 12-Lead, Split night study - will refer  for sleep study to evaluate as contributor to symptoms.  - [id number]  Hyperlipidemia, unspecified hyperlipidemia type - lipids are optimized, continue atorvastatin 40 mg daily. No LFT abnormalities.  Essential hypertension - stable on current regimen, will refill metoprolol.   Total time of encounter: 30 minutes total time of encounter, including 20 minutes spent in face-to-face patient care on the date of this encounter. This time includes coordination of care and counseling regarding above mentioned problem list. Remainder of non-face-to-face time involved reviewing chart documents/testing relevant to the patient encounter and documentation in the medical record. I have independently reviewed documentation from referring provider.   Cherlynn Kaiser, MD [place]  [facility]    Medication Adjustments/Labs and Tests Ordered: Current medicines are reviewed at length with the patient today.  Concerns regarding medicines are outlined above.   Orders Placed This Encounter  Procedures   EKG 12-Lead   Split night study    Meds ordered this encounter  Medications   metoprolol tartrate (LOPRESSOR) 50 MG tablet    Sig: Take 1 tablet (50 mg total) by mouth 2 (two) times daily.    Dispense:  180 tablet     Refill:  3    Patient Instructions  Medication Instructions:  No Changes In Medications at this time.  *If you need a refill on your cardiac medications before your next appointment, please call your pharmacy*  Lab Work: None Ordered At This Time.  If you have labs (blood work) drawn today and your tests are completely normal, you will receive your results only by: [place] (if you have MyChart) OR A paper copy in the mail If you have any lab test that is abnormal or we need to change your treatment, we will call you to review the results.  Testing/Procedures: Your physician has recommended that you have a sleep study. This test records several body functions during sleep, including: brain activity, eye movement, oxygen and carbon dioxide blood levels, heart rate and rhythm, breathing rate and rhythm, the flow of air through your mouth and nose, snoring, body muscle movements, and chest and belly movement. SOME ONE WILL CONTACT YOU TO SCHEDULE THIS    Follow-Up: At [facility], you and your health needs are our priority.  As part of our continuing mission to provide you with exceptional heart care, we have created designated Provider Care Teams.  These Care Teams include your primary Cardiologist (physician) and Advanced Practice Providers (APPs -  Physician Assistants and Nurse Practitioners) who all work together to provide you with the care you need, when you need it.    Your next appointment:   6 month(s)  The format for your next appointment:   In Person  Provider:   Cherlynn Kaiser, MD

## 2020-01-16 NOTE — Patient Instructions (Signed)
Medication Instructions:  No Changes In Medications at this time.  *If you need a refill on your cardiac medications before your next appointment, please call your pharmacy*  Lab Work: None Ordered At This Time.  If you have labs (blood work) drawn today and your tests are completely normal, you will receive your results only by:  [place] (if you have MyChart) OR  A paper copy in the mail If you have any lab test that is abnormal or we need to change your treatment, we will call you to review the results.  Testing/Procedures: Your physician has recommended that you have a sleep study. This test records several body functions during sleep, including: brain activity, eye movement, oxygen and carbon dioxide blood levels, heart rate and rhythm, breathing rate and rhythm, the flow of air through your mouth and nose, snoring, body muscle movements, and chest and belly movement. SOME ONE WILL CONTACT YOU TO SCHEDULE THIS    Follow-Up: At [facility], you and your health needs are our priority.  As part of our continuing mission to provide you with exceptional heart care, we have created designated Provider Care Teams.  These Care Teams include your primary Cardiologist (physician) and Advanced Practice Providers (APPs -  Physician Assistants and Nurse Practitioners) who all work together to provide you with the care you need, when you need it.    Your next appointment:   6 month(s)  The format for your next appointment:   In Person  Provider:   Cherlynn Kaiser, MD

## 2020-01-17 ENCOUNTER — Other Ambulatory Visit (INDEPENDENT_AMBULATORY_CARE_PROVIDER_SITE_OTHER): Payer: 59

## 2020-01-17 DIAGNOSIS — R7989 Other specified abnormal findings of blood chemistry: Secondary | ICD-10-CM

## 2020-01-17 MED ORDER — TESTOSTERONE CYPIONATE 200 MG/ML IM SOLN
200.0000 mg | INTRAMUSCULAR | Status: DC
  Administered 2020-01-17: 200 mg via INTRAMUSCULAR

## 2020-02-08 ENCOUNTER — Encounter: Payer: Self-pay | Admitting: Endocrinology

## 2020-02-08 ENCOUNTER — Ambulatory Visit (INDEPENDENT_AMBULATORY_CARE_PROVIDER_SITE_OTHER): Payer: 59 | Admitting: Endocrinology

## 2020-02-08 ENCOUNTER — Other Ambulatory Visit: Payer: Self-pay

## 2020-02-08 VITALS — BP 128/82 | HR 82 | Ht 64.0 in | Wt 119.0 lb

## 2020-02-08 DIAGNOSIS — K859 Acute pancreatitis without necrosis or infection, unspecified: Secondary | ICD-10-CM

## 2020-02-08 DIAGNOSIS — E119 Type 2 diabetes mellitus without complications: Secondary | ICD-10-CM | POA: Diagnosis not present

## 2020-02-08 LAB — POCT GLYCOSYLATED HEMOGLOBIN (HGB A1C): Hemoglobin A1C: 8 % — AB (ref 4.0–5.6)

## 2020-02-08 LAB — TSH: TSH: 1.48 u[IU]/mL (ref 0.35–4.50)

## 2020-02-08 LAB — T4, FREE: Free T4: 0.94 ng/dL (ref 0.60–1.60)

## 2020-02-08 MED ORDER — REPAGLINIDE 0.5 MG PO TABS: 0.5000 mg | ORAL_TABLET | Freq: Three times a day (TID) | ORAL | 11 refills | Status: DC

## 2020-02-08 NOTE — Patient Instructions (Addendum)
Because the testosterone shots are not helping you feel better, you should abandon them.  Because the high blood sugar has persisted despite weight loss, you should assume you are slowly developing type 1 diabetes.  Continued diet and exercise can slow this process.   check your blood sugar once a day.  vary the time of day when you check, between before the 3 meals, and at bedtime.  also check if you have symptoms of your blood sugar being too high or too low.  please keep a record of the readings and bring it to your next appointment here (or you can bring the meter itself).  You can write it on any piece of paper.  please call us sooner if your blood sugar goes below 70, or if you have a lot of readings over 200.  Blood tests are requested for you today.  We'll let you know about the results. Please continue the same metformin, and:  I have sent a prescription to your pharmacy, to add "repaglinide." Please see a GI specialist.  you will receive a phone call, about a day and time for an appointment.  Please come back for a follow-up appointment in 2 months.

## 2020-02-08 NOTE — Progress Notes (Signed)
Subjective:    Patient ID: [name] [name], male    DOB: 15-Jan-1976, 44 y.o.   MRN: [id number]  HPI Pt is referred by Dr Redmond School, for hypogonadism.  Pt reports he had puberty at the normal age.  He has biological children.  He says he has never taken illicit androgens.  He has been on depo-testosterone x 2 months--did not help sxs, but may have caused mood swings.  He does not take antiandrogens or opioids.  He denies any h/o XRT.  He has never had surgery, or a serious injury to the head or genital area.  He has no h/o sleep apnea or DVT.   He does not consume alcohol excessively.  He had polycythemia in 2020--pt says he does not know why this was, but it resolved without rx.  He reported sxs of fatigue, ED, and decreased libido.   He has lost 30 lbs x 1 year--unintentional, but it has leveled off.  He says cbg's have improved to the 100's.  He requests ref GI, to f/u pancreatitis.   Past Medical History:  Diagnosis Date  . Anxiety   . Coronary artery disease    a. 05/2014 OOH MI/PCI: LM nl, LAD 40p, 99p (3.0x28 Synergy DES) w/ R->L Collaterals, LCX nl, RCA 30p/d, EF 55% w/ sev distal inf/ant HK.  . Depression   . Diabetes mellitus without complication ([place])   . Erectile dysfunction   . H/O echocardiogram    a. 05/2014 Echo: Ef 45-50%, sev mid antsept/ant HK.  Marland Kitchen Hyperlipidemia   . Hypertension    medication in 2012, stress as a big component  . 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    Marland Kitchen Pancreatitis 10/2018  . Tobacco abuse     Past Surgical History:  Procedure Laterality Date  . CORONARY STENT PLACEMENT  05/24/2014   MID LAD DES   . LEFT HEART CATHETERIZATION WITH CORONARY ANGIOGRAM N/A 05/24/2014   Procedure: LEFT HEART CATHETERIZATION WITH CORONARY ANGIOGRAM;  Surgeon: Jettie Booze, MD;  Location: [facility] CATH LAB;  Service: Cardiovascular;  Laterality: N/A;  . PERCUTANEOUS CORONARY STENT INTERVENTION (PCI-S)  05/24/2014   Procedure: PERCUTANEOUS CORONARY STENT  INTERVENTION (PCI-S);  Surgeon: Jettie Booze, MD;  Location: [facility] CATH LAB;  Service: Cardiovascular;;  . PILONIDAL CYST EXCISION  1995    Social History   Socioeconomic History  . Marital status: Single    Spouse name: Not on file  . Number of children: Not on file  . Years of education: Not on file  . Highest education level: Not on file  Occupational History  . Occupation: Secondary school teacher  Tobacco Use  . Smoking status: Current Every Day Smoker    Packs/day: 1.00    Years: 23.00    Pack years: 23.00    Types: Cigarettes  . Smokeless tobacco: Never Used  . Tobacco comment: currently smoking 0.5 ppd, declines patch  Vaping Use  . Vaping Use: Never used  Substance and Sexual Activity  . Alcohol use: Yes    Alcohol/week: 24.0 standard drinks    Types: 24 Cans of beer per week    Comment: 1/5-3/4 Fifth, 4-5 times a week.  +Guilty, cutting down; 2/4 CAGE questions.  . Drug use: No  . Sexual activity: Yes    Partners: Male  Other Topics Concern  . Not on file  Social History Narrative   Lives at home with roommate, exercise at gym in apartment complex, diet - no discretion.  Social Determinants of Health   Financial Resource Strain:   . Difficulty of Paying Living Expenses: Not on file  Food Insecurity:   . Worried About Charity fundraiser in the Last Year: Not on file  . Ran Out of Food in the Last Year: Not on file  Transportation Needs:   . Lack of Transportation (Medical): Not on file  . Lack of Transportation (Non-Medical): Not on file  Physical Activity:   . Days of Exercise per Week: Not on file  . Minutes of Exercise per Session: Not on file  Stress:   . Feeling of Stress : Not on file  Social Connections:   . Frequency of Communication with Friends and Family: Not on file  . Frequency of Social Gatherings with Friends and Family: Not on file  . Attends Religious Services: Not on file  . Active Member of Clubs or Organizations: Not on  file  . Attends Archivist Meetings: Not on file  . Marital Status: Not on file  Intimate Partner Violence:   . Fear of Current or Ex-Partner: Not on file  . Emotionally Abused: Not on file  . Physically Abused: Not on file  . Sexually Abused: Not on file    Current Outpatient Medications on File Prior to Visit  Medication Sig Dispense Refill  . albuterol (VENTOLIN HFA) 108 (90 Base) MCG/ACT inhaler Inhale 2 puffs into the lungs every 6 (six) hours as needed for wheezing or shortness of breath. 8 g 0  . ALPRAZolam (XANAX) 0.5 MG tablet TAKE ONE TABLET BY MOUTH DAILY AS NEEDED FOR ANXIETY 30 tablet 0  . aspirin EC 81 MG EC tablet Take 1 tablet (81 mg total) by mouth daily.    Marland Kitchen atorvastatin (LIPITOR) 40 MG tablet TAKE ONE TABLET BY MOUTH DAILY AT 6PM (Patient taking differently: Take 40 mg by mouth daily. TAKE ONE TABLET BY MOUTH DAILY AT 6PM) 90 tablet 3  . emtricitabine-tenofovir (TRUVADA) 200-300 MG tablet Take 1 tablet by mouth daily.     Marland Kitchen glucose blood (ONETOUCH VERIO) test strip Use as instructed to check blood sugar once daily. 100 each 12  . lisinopril-hydrochlorothiazide (ZESTORETIC) 20-12.5 MG tablet Take 1 tablet by mouth daily. Rx for 40-25 mg daily    . metFORMIN (GLUCOPHAGE) 500 MG tablet Take 1 tablet (500 mg total) by mouth 2 (two) times daily with a meal. 60 tablet 5  . metoprolol tartrate (LOPRESSOR) 50 MG tablet Take 1 tablet (50 mg total) by mouth 2 (two) times daily. 180 tablet 3  . Multiple Vitamin (MULTIVITAMIN WITH MINERALS) TABS tablet Take 1 tablet by mouth daily.    Glory Rosebush Delica Lancets 15V MISC 1 each by Does not apply route daily. Check blood sugar once daily 100 each 3  . vitamin B-12 (CYANOCOBALAMIN) 1000 MCG tablet Take 1,000 mcg by mouth daily.     Current Facility-Administered Medications on File Prior to Visit  Medication Dose Route Frequency Provider Last Rate Last Admin  . testosterone cypionate (DEPOTESTOSTERONE CYPIONATE) injection 200  mg  200 mg Intramuscular Q14 Days Denita Lung, MD   200 mg at 01/17/20 0941    Allergies  Allergen Reactions  . Gadolinium Derivatives Nausea Only    MRI dye    Family History  Problem Relation Age of Onset  . Thyroid disease Mother   . Heart disease Father 69       MI, stents  . Peripheral Artery Disease Father   . Diabetes type  II Father   . Diabetes Father   . Diabetes Maternal Grandmother   . Hypertension Maternal Grandfather   . Hypertension Paternal Grandfather   . Cancer Neg Hx   . Stroke Neg Hx   . Other Neg Hx        hypogonadism    BP 128/82 (BP Location: Left Arm, Patient Position: Sitting, Cuff Size: Normal)   Pulse 82   Ht 5' 4"  (1.626 m)   Wt 119 lb (54 kg)   SpO2 98%   BMI 20.43 kg/m     Review of Systems denies numbness, gynecomastia, muscle weakness, headache, and sob.       Objective:   Physical Exam VS: see vs page GEN: no distress HEAD: head: no deformity eyes: no periorbital swelling, no proptosis external nose and ears are normal NECK: supple, thyroid is not enlarged CHEST WALL: no deformity LUNGS: clear to auscultation BREASTS:  No gynecomastia CV: reg rate and rhythm, no murmur GENITALIA:  Normal male.   MUSCULOSKELETAL: muscle bulk and strength are grossly normal.  no joint swelling is seen  gait is normal and steady.   EXTEMITIES: no leg edema NEURO:  cn 2-12 grossly intact.  sensation is intact to touch on all 4's SKIN:  Normal texture and temperature.  No rash or suspicious lesion is visible.  Normal male hair distribution.  NODES:  None palpable at the neck PSYCH: alert, well-oriented.  Does not appear anxious nor depressed.    Lab Results  Component Value Date   TESTOSTERONE 302 11/15/2019   Lab Results  Component Value Date   WBC 5.7 12/26/2019   HGB 12.6 (L) 12/26/2019   HCT 37.9 12/26/2019   MCV 94 12/26/2019   PLT 304 12/26/2019   Lab Results  Component Value Date   CREATININE 0.57 (L) 12/26/2019   BUN 4  (L) 12/26/2019   NA 138 12/26/2019   K 3.7 12/26/2019   CL 98 12/26/2019   CO2 18 (L) 12/26/2019    Brain MRI (2016): no mention is made of the pituitary.    I have reviewed outside records, and summarized: Pt was noted to have abnormal testosterone and A1c, and referred here.  He has been admitted several time with acute pancreatitis.      Assessment & Plan:  Weight loss, poss due to pancreatitis Type 2 DM: uncontrolled Low testosterone.  Supplementation did not help sxs.   Patient Instructions  Because the testosterone shots are not helping you feel better, you should abandon them.  Because the high blood sugar has persisted despite weight loss, you should assume you are slowly developing type 1 diabetes.  Continued diet and exercise can slow this process.   check your blood sugar once a day.  vary the time of day when you check, between before the 3 meals, and at bedtime.  also check if you have symptoms of your blood sugar being too high or too low.  please keep a record of the readings and bring it to your next appointment here (or you can bring the meter itself).  You can write it on any piece of paper.  please call us sooner if your blood sugar goes below 70, or if you have a lot of readings over 200.  Blood tests are requested for you today.  We'll let you know about the results. Please continue the same metformin, and:  I have sent a prescription to your pharmacy, to add "repaglinide." Please see a GI specialist.  you will receive a phone call, about a day and time for an appointment.  Please come back for a follow-up appointment in 2 months.

## 2020-02-21 ENCOUNTER — Telehealth: Payer: Self-pay | Admitting: Internal Medicine

## 2020-02-21 NOTE — Telephone Encounter (Signed)
PA for split night pending.  All notes faxed to [facility].

## 2020-02-22 ENCOUNTER — Ambulatory Visit: Payer: 59 | Admitting: Physician Assistant

## 2020-02-25 ENCOUNTER — Ambulatory Visit: Payer: 59 | Attending: Internal Medicine

## 2020-02-25 DIAGNOSIS — Z23 Encounter for immunization: Secondary | ICD-10-CM

## 2020-02-25 NOTE — Progress Notes (Signed)
   Covid-19 Vaccination Clinic  Name:  Ari Bernabei    MRN: [id number] DOB: 1975-08-19  02/25/2020  Mr. Austad was observed post Covid-19 immunization for 15 minutes without incident. He was provided with Vaccine Information Sheet and instruction to access the V-Safe system.   Mr. Parisi was instructed to call 911 with any severe reactions post vaccine: Marland Kitchen Difficulty breathing  . Swelling of face and throat  . A fast heartbeat  . A bad rash all over body  . Dizziness and weakness   Immunizations Administered    Name Date Dose VIS Date Route   Pfizer COVID-19 Vaccine 02/25/2020  2:54 PM 0.3 mL 12/26/2019 Intramuscular   Manufacturer: [place]   Lot: [id number]   [place]: [id number]

## 2020-02-27 ENCOUNTER — Ambulatory Visit: Payer: 59 | Admitting: Family Medicine

## 2020-03-04 NOTE — Telephone Encounter (Signed)
PA approved for split night. Called and lm with patient that he is scheduled for Sunday, Jan 30 at 8 pm, to expect info packet and left the sleep lab number.

## 2020-03-06 ENCOUNTER — Other Ambulatory Visit: Payer: Self-pay | Admitting: Family Medicine

## 2020-03-06 DIAGNOSIS — F418 Other specified anxiety disorders: Secondary | ICD-10-CM

## 2020-03-06 NOTE — Telephone Encounter (Signed)
Is this okay to refill? 

## 2020-03-13 ENCOUNTER — Other Ambulatory Visit: Payer: Self-pay

## 2020-03-13 ENCOUNTER — Ambulatory Visit (INDEPENDENT_AMBULATORY_CARE_PROVIDER_SITE_OTHER): Payer: 59 | Admitting: Nurse Practitioner

## 2020-03-13 ENCOUNTER — Other Ambulatory Visit (INDEPENDENT_AMBULATORY_CARE_PROVIDER_SITE_OTHER): Payer: 59

## 2020-03-13 ENCOUNTER — Encounter: Payer: Self-pay | Admitting: Nurse Practitioner

## 2020-03-13 VITALS — BP 128/80 | HR 77 | Ht 64.0 in | Wt 120.0 lb

## 2020-03-13 DIAGNOSIS — K852 Alcohol induced acute pancreatitis without necrosis or infection: Secondary | ICD-10-CM

## 2020-03-13 DIAGNOSIS — K863 Pseudocyst of pancreas: Secondary | ICD-10-CM

## 2020-03-13 LAB — COMPREHENSIVE METABOLIC PANEL
ALT: 16 U/L (ref 0–53)
AST: 21 U/L (ref 0–37)
Albumin: 3.9 g/dL (ref 3.5–5.2)
Alkaline Phosphatase: 91 U/L (ref 39–117)
BUN: 10 mg/dL (ref 6–23)
CO2: 29 mEq/L (ref 19–32)
Calcium: 9.2 mg/dL (ref 8.4–10.5)
Chloride: 97 mEq/L (ref 96–112)
Creatinine, Ser: 0.72 mg/dL (ref 0.40–1.50)
GFR: 109.61 mL/min (ref >60.00)
Glucose, Bld: 141 mg/dL — ABNORMAL HIGH (ref 70–99)
Potassium: 4 mEq/L (ref 3.5–5.1)
Sodium: 134 mEq/L — ABNORMAL LOW (ref 135–145)
Total Bilirubin: 0.4 mg/dL (ref 0.2–1.2)
Total Protein: 6.9 g/dL (ref 6.0–8.3)

## 2020-03-13 LAB — CBC WITH DIFFERENTIAL/PLATELET
Basophils Absolute: 0.1 10*3/uL (ref 0.0–0.1)
Basophils Relative: 0.7 % (ref 0.0–3.0)
Eosinophils Absolute: 0.1 10*3/uL (ref 0.0–0.7)
Eosinophils Relative: 1.4 % (ref 0.0–5.0)
HCT: 38.4 % — ABNORMAL LOW (ref 39.0–52.0)
Hemoglobin: 13 g/dL (ref 13.0–17.0)
Lymphocytes Relative: 33.9 % (ref 12.0–46.0)
Lymphs Abs: 2.6 10*3/uL (ref 0.7–4.0)
MCHC: 34 g/dL (ref 30.0–36.0)
MCV: 91.4 fl (ref 78.0–100.0)
Monocytes Absolute: 0.6 10*3/uL (ref 0.1–1.0)
Monocytes Relative: 7.6 % (ref 3.0–12.0)
Neutro Abs: 4.3 10*3/uL (ref 1.4–7.7)
Neutrophils Relative %: 56.4 % (ref 43.0–77.0)
Platelets: 303 10*3/uL (ref 150.0–400.0)
RBC: 4.2 Mil/uL — ABNORMAL LOW (ref 4.22–5.81)
RDW: 14.7 % (ref 11.5–15.5)
WBC: 7.6 10*3/uL (ref 4.0–10.5)

## 2020-03-13 LAB — TRIGLYCERIDES: Triglycerides: 81 mg/dL (ref 0.0–149.0)

## 2020-03-13 LAB — LIPASE: Lipase: 53 U/L (ref 11.0–59.0)

## 2020-03-13 NOTE — Progress Notes (Signed)
03/13/2020 Carnelius Hammitt [id number] October 16, 1975   CHIEF COMPLAINT: Pancreatitis follow up  HISTORY OF PRESENT ILLNESS:  Mouhamadou Gittleman is a 45 year old male with a past medical history of anxiety, depression, hypertension, coronary artery disease s/p stent placement 2016, newly diagnosed with DM II, on Tuvada for pre-exposure prophylaxis of HIV, neurosyphilis 2016  and EtOH pancreatitis 2020. Pilonidal cyst excision in 1995.  He was admitted to [facility] 12/09/2019 due to having N/V and upper abdominal pain. In the ED, he was noted to be hyponatremic.  Lipase level was 86.  Bilirubin was mildly elevated at 1.3. WBC of 19.4.  CT scan of the abdomen and pelvis showed acute pancreatitis with multiple pseudocysts. There was some concern his pancreatitis was possible due to Truvada which he took in the past then stopped due to lack of insurance then recently restarted taking it. His pancreatitis was most likely due to ongoing alcohol use. Truvada was continued. A GI consult was completed by [facility] gastroenterologist Dr. Penelope Coop. He was treated conservatively with IV fluids and antiemetics and his abdominal pain improved.  He was discharged home 12/12/2019 with instructions to follow-up with Dr. Penelope Coop in 4 weeks and for repeat CT imaging.  However, the patient stated [facility] does not accept his current insurance therefore he presents today to establish his GI management with our practice.  Currently, he reports feeling well.  No nausea or vomiting.  His appetite has increased.  No upper or lower abdominal pain.  He is passing a normal formed brown bowel movement daily.  No rectal bleeding or black stool.  He began drinking alcohol fairly heavily early 2020.  He reported drinking one half to one 5th of rum daily until he was hospitalized for EtOH pancreatitis 10/20/2018.  At that time, a CTAP showed probable pancreatitis without pseudocyst or necrosis.  Lipase level was 170.  His hospital course was  uncomplicated and his symptoms improved and he was discharged home 10/22/2018.  Since that time, he reports drinking less alcohol.  He occasionally "binge drinks" which consist of one half of a 5th of liquor every other week and he sometimes has 2 glasses of wine approximately once weekly.  He reported weighing 140 pounds 2 years ago. His weight today is 120 pounds.  No fever, sweats or chills.  He denies ever having a screening colonoscopy.  No family history of esophageal, gastric, colon or pancreatic cancer.  No family  history of autoimmune pancreatitis.   CBC Latest Ref Rng & Units 03/13/2020 12/26/2019 12/12/2019  WBC 4.0 - 10.5 K/uL 7.6 5.7 7.6  Hemoglobin 13.0 - 17.0 g/dL 13.0 12.6(L) 12.2(L)  Hematocrit 39.0 - 52.0 % 38.4(L) 37.9 36.2(L)  Platelets 150.0 - 400.0 K/uL 303.0 304 183   Lipase     Component Value Date/Time   LIPASE 53.0 03/13/2020 1025   CMP Latest Ref Rng & Units 03/13/2020 12/26/2019 12/12/2019  Glucose 70 - 99 mg/dL 141(H) 415(H) 208(H)  BUN 6 - 23 mg/dL 10 4(L) 7  Creatinine 0.40 - 1.50 mg/dL 0.72 0.57(L) 0.61  Sodium 135 - 145 mEq/L 134(L) 138 134(L)  Potassium 3.5 - 5.1 mEq/L 4.0 3.7 3.6  Chloride 96 - 112 mEq/L 97 98 101  CO2 19 - 32 mEq/L 29 18(L) 24  Calcium 8.4 - 10.5 mg/dL 9.2 8.9 8.2(L)  Total Protein 6.0 - 8.3 g/dL 6.9 6.2 5.4(L)  Total Bilirubin 0.2 - 1.2 mg/dL 0.4 0.2 0.7  Alkaline Phos 39 - 117 U/L  91 84 63  AST 0 - 37 U/L 21 10 18   ALT 0 - 53 U/L 16 8 12     CTAP with contrast 12/09/2019: Pancreas: Multiple fluid collections of varying sizes are seen within and surrounding the pancreas most consistent with pancreatic pseudocyst. Moderate inflammatory changes are noted suggesting acute pancreatitis. Largest fluid collection measures 6.2 x 3.0 cm posterior to the distal stomach consistent with pseudocyst. 3.9 x 3.7 cm fluid collection is noted in the pancreatic body and tail consistent with pseudocyst. Multiple other smaller collections are noted   Aortic atherosclerosis.   CTAP with contrast 10/20/2018: 1. Pancreas is mildly edematous throughout its contour. Slight fluid is adjacent to the head and uncinate process of the pancreas. No well-defined fluid collection or pseudocyst. No pancreatic duct dilatation. No pancreatic calcification. No evident pancreatic necrosis.  2. Wall thickening in the gastric antrum consistent with a degree of gastritis. There is wall thickening in the proximal duodenum consistent with duodenitis. Wall thickening in the mid duodenum, particularly involving the proximal third portion of the duodenum, is likely due to adjacent pancreatitis. No gastric or duodenal ulceration is seen by CT. No fistula evident.  3. Apparent gastroesophageal reflux into the distal esophagus with wall thickening in the distal esophagus consistent with esophagitis.  4. No bowel obstruction. No abscess in the abdomen or pelvis. Appendix appears normal.  5. No evident renal or ureteral calculus. No hydronephrosis. Urinary bladder wall thickness normal.  6.  Hepatic steatosis  Past Medical History:  Diagnosis Date  . Anxiety   . Coronary artery disease    a. 05/2014 OOH MI/PCI: LM nl, LAD 40p, 99p (3.0x28 Synergy DES) w/ R->L Collaterals, LCX nl, RCA 30p/d, EF 55% w/ sev distal inf/ant HK.  . Depression   . Diabetes mellitus without complication ([place])   . Erectile dysfunction   . H/O echocardiogram    a. 05/2014 Echo: Ef 45-50%, sev mid antsept/ant HK.  Marland Kitchen Hyperlipidemia   . Hypertension    medication in 2012, stress as a big component  . 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    Marland Kitchen Pancreatitis 10/2018  . Tobacco abuse    Past Surgical History:  Procedure Laterality Date  . CORONARY STENT PLACEMENT  05/24/2014   MID LAD DES   . LEFT HEART CATHETERIZATION WITH CORONARY ANGIOGRAM N/A 05/24/2014   Procedure: LEFT HEART CATHETERIZATION WITH CORONARY ANGIOGRAM;  Surgeon: Jettie Booze, MD;   Location: [facility] CATH LAB;  Service: Cardiovascular;  Laterality: N/A;  . PERCUTANEOUS CORONARY STENT INTERVENTION (PCI-S)  05/24/2014   Procedure: PERCUTANEOUS CORONARY STENT INTERVENTION (PCI-S);  Surgeon: Jettie Booze, MD;  Location: [facility] CATH LAB;  Service: Cardiovascular;;  . PILONIDAL CYST EXCISION  1995   Social History: He is single. He works in Engineer, technical sales. He smokes 2 cigar twice weekly.  See HPI for alcohol intake.  No drug use.  Family History: Mother 28 thyroid disease. Father age 69 MI, DM II, bilateral LE amputations and colon cancer. Two brothers and two sisters are relatively healthy.    Allergies  Allergen Reactions  . Gadolinium Derivatives Nausea Only    MRI dye      Outpatient Encounter Medications as of 03/13/2020  Medication Sig  . albuterol (VENTOLIN HFA) 108 (90 Base) MCG/ACT inhaler Inhale 2 puffs into the lungs every 6 (six) hours as needed for wheezing or shortness of breath.  . ALPRAZolam (XANAX) 0.5 MG tablet TAKE ONE TABLET BY MOUTH DAILY AS NEEDED FOR ANXIETY  .  aspirin EC 81 MG EC tablet Take 1 tablet (81 mg total) by mouth daily.  Marland Kitchen atorvastatin (LIPITOR) 40 MG tablet TAKE ONE TABLET BY MOUTH DAILY AT 6PM (Patient taking differently: Take 40 mg by mouth daily. TAKE ONE TABLET BY MOUTH DAILY AT 6PM)  . emtricitabine-tenofovir (TRUVADA) 200-300 MG tablet Take 1 tablet by mouth daily.   Marland Kitchen glucose blood (ONETOUCH VERIO) test strip Use as instructed to check blood sugar once daily.  Marland Kitchen lisinopril-hydrochlorothiazide (ZESTORETIC) 20-12.5 MG tablet Take 1 tablet by mouth daily. Rx for 40-25 mg daily  . metFORMIN (GLUCOPHAGE) 500 MG tablet Take 1 tablet (500 mg total) by mouth 2 (two) times daily with a meal.  . metoprolol tartrate (LOPRESSOR) 50 MG tablet Take 1 tablet (50 mg total) by mouth 2 (two) times daily.  . Multiple Vitamin (MULTIVITAMIN WITH MINERALS) TABS tablet Take 1 tablet by mouth daily.  Glory Rosebush Delica Lancets 40J MISC 1 each by Does not  apply route daily. Check blood sugar once daily  . repaglinide (PRANDIN) 0.5 MG tablet Take 1 tablet (0.5 mg total) by mouth 3 (three) times daily before meals.  . [DISCONTINUED] vitamin B-12 (CYANOCOBALAMIN) 1000 MCG tablet Take 1,000 mcg by mouth daily. (Patient not taking: Reported on 03/13/2020)   Facility-Administered Encounter Medications as of 03/13/2020  Medication  . testosterone cypionate (DEPOTESTOSTERONE CYPIONATE) injection 200 mg    REVIEW OF SYSTEMS:  Gen: Denies fever, sweats or chills. No weight loss.  CV: Denies chest pain, palpitations or edema. Resp: Denies cough, shortness of breath of hemoptysis.  GI: See HPI. GU : Denies urinary burning, blood in urine, increased urinary frequency or incontinence. MS: Denies joint pain, muscles aches or weakness. Derm: Denies rash, itchiness, skin lesions or unhealing ulcers. Psych: Denies depression, anxiety or memory loss. Heme: Denies bruising, bleeding. Neuro:  Denies headaches, dizziness or paresthesias. Endo:  Recently diagnosed with  DM II.   PHYSICAL EXAM: BP 128/80   Pulse 77   Ht 5' 4"  (1.626 m)   Wt 120 lb (54.4 kg)   SpO2 97%   BMI 20.60 kg/m   Wt Readings from Last 3 Encounters:  03/13/20 120 lb (54.4 kg)  02/08/20 119 lb (54 kg)  01/16/20 122 lb (55.3 kg)   General: Thin 45 year old male in no acute distress. Head: Normocephalic and atraumatic. Eyes:  Sclerae non-icteric, conjunctive pink. Ears: Normal auditory acuity. Mouth: Poor dentition.  No ulcers or lesions.  Neck: Supple, no lymphadenopathy or thyromegaly.  Lungs: Clear bilaterally to auscultation without wheezes, crackles or rhonchi. Heart: Regular rate and rhythm. No murmur, rub or gallop appreciated.  Abdomen: Soft, nontender, non distended. No masses. No hepatosplenomegaly. Normoactive bowel sounds x 4 quadrants.  Rectal: Deferred. Musculoskeletal: Symmetrical with no gross deformities. Skin: Warm and dry. No rash or lesions on visible  extremities. Extremities: No edema. Neurological: Alert oriented x 4, no focal deficits.  Psychological:  Alert and cooperative. Normal mood and affect.  ASSESSMENT AND PLAN:  53. 45 year old male a history of EtOH pancreatitis admitted to the hospital 12/3 -03/14/2019 with N/V and abdominal pain. CTAP identified multiple fluid collections of varying sizes are seen within and surrounding the pancreas most consistent with pancreatic pseudocysts. Moderate inflammatory changes are noted suggesting acute pancreatitis. Largest fluid collection measures 6.2 x 3.0 cm posterior to the distal stomach consistent with pseudocyst. Continued alcohol use.  -CBC, CMP, IgG4 and triglyceride level -Repeat abdominal/pelvic CT with IV contrast to reassess pancreatic pseudocysts, if no improvement or enlargement  of the pseudocysts he may acquire a cystgastrostomy with Dr. Rush Landmark -No alcohol -Heart healthy diet recommended -Further recommendation to be determined after the above lab and CT results received  2.  Colon cancer screening.  Family history (father with colon cancer). -Patient to follow-up with in the office with Dr. Loletha Carrow in 4 to 6 weeks to further discuss scheduling a screening colonoscopy  3.  Newly diagnosed diabetes mellitus type 2 on Metformin  4.  Weight loss.  Patient reports a 20 pound weight loss in the past year.  5. Hepatic steatosis per CTAP 10/2018 most likely due to alcohol abuse, normal LFTs     CC:  Denita Lung, MD

## 2020-03-13 NOTE — Patient Instructions (Signed)
If you are age 47 or older, your body mass index should be between 23-30. Your Body mass index is 20.6 kg/m. If this is out of the aforementioned range listed, please consider follow up with your Primary Care Provider.  If you are age 46 or younger, your body mass index should be between 19-25. Your Body mass index is 20.6 kg/m. If this is out of the aformentioned range listed, please consider follow up with your Primary Care Provider.   Please follow up with Dr. Loletha Carrow in 6 weeks and to discuss colon cancer screening at that time.  Further recommendations to be determined after your repeat CT scan  No alcohol use recommended.   Your provider has requested that you go to the basement level for lab work before leaving today. Press "B" on the elevator. The lab is located at the first door on the left as you exit the elevator.  Due to recent changes in healthcare laws, you may see the results of your imaging and laboratory studies on MyChart before your provider has had a chance to review them.  We understand that in some cases there may be results that are confusing or concerning to you. Not all laboratory results come back in the same time frame and the provider may be waiting for multiple results in order to interpret others.  Please give [place] 48 hours in order for your provider to thoroughly review all the results before contacting the office for clarification of your results.   You have been scheduled for a CT scan of the abdomen and pelvis at [facility] ([address]---this is in the same building as [employer]).   You are scheduled on 03/20/20 at 10:00am. You should arrive 15 minutes prior to your appointment time for registration. Please follow the written instructions below on the day of your exam:  WARNING: IF YOU ARE ALLERGIC TO IODINE/X-RAY DYE, PLEASE NOTIFY RADIOLOGY IMMEDIATELY AT [phone]! YOU WILL BE GIVEN A 13 HOUR PREMEDICATION PREP.   Do not eat or  drink anything after 6:00am (4 hours prior to your test)  You may take any medications as prescribed with a small amount of water, if necessary. If you take any of the following medications: METFORMIN, GLUCOPHAGE, GLUCOVANCE, AVANDAMET, RIOMET, FORTAMET, [place] MET, JANUMET, GLUMETZA or METAGLIP, you MAY be asked to HOLD this medication 48 hours AFTER the exam.  The purpose of you drinking the oral contrast is to aid in the visualization of your intestinal tract. The contrast solution may cause some diarrhea. Depending on your individual set of symptoms, you may also receive an intravenous injection of x-ray contrast/dye. Plan on being at [facility] for 30 minutes or longer, depending on the type of exam you are having performed.  This test typically takes 30-45 minutes to complete.  If you have any questions regarding your exam or if you need to reschedule, you may call the CT department at [phone] between the hours of 8:00 am and 5:00 pm, Monday-Friday.  ________________________________________________________________________  It was great seeing you today!  Thank you for entrusting me with your care and choosing [facility].  Carl Best, NP

## 2020-03-14 LAB — IGG 4: IgG, Subclass 4: 34 mg/dL (ref 2–96)

## 2020-03-14 NOTE — Progress Notes (Signed)
____________________________________________________________  Attending physician addendum:  Thank you for sending this case to me. I have reviewed the entire note and agree with the plan.  Agree with CT scan.  Even if there is a persistent pseudocyst, it will not necessarily need a cystgastrostomy unless it causes significant symptoms, as most will at least improve if not resolve with time and, in this case, alcohol abstinence.  I will see him as scheduled, discuss all this and CT results and plan for screening colonoscopy.  Wilfrid Lund, MD  ____________________________________________________________

## 2020-03-19 ENCOUNTER — Other Ambulatory Visit: Payer: Self-pay

## 2020-03-20 ENCOUNTER — Inpatient Hospital Stay: Admission: RE | Admit: 2020-03-20 | Payer: 59 | Source: Ambulatory Visit

## 2020-03-25 ENCOUNTER — Inpatient Hospital Stay: Admission: RE | Admit: 2020-03-25 | Payer: 59 | Source: Ambulatory Visit

## 2020-03-26 ENCOUNTER — Inpatient Hospital Stay: Admission: RE | Admit: 2020-03-26 | Payer: 59 | Source: Ambulatory Visit

## 2020-03-26 ENCOUNTER — Other Ambulatory Visit: Payer: 59

## 2020-04-01 ENCOUNTER — Ambulatory Visit (INDEPENDENT_AMBULATORY_CARE_PROVIDER_SITE_OTHER): Payer: 59 | Admitting: Family Medicine

## 2020-04-01 ENCOUNTER — Encounter: Payer: Self-pay | Admitting: Family Medicine

## 2020-04-01 ENCOUNTER — Other Ambulatory Visit: Payer: Self-pay

## 2020-04-01 VITALS — BP 134/88 | HR 82 | Temp 96.9°F | Ht 63.0 in | Wt 124.6 lb

## 2020-04-01 DIAGNOSIS — E785 Hyperlipidemia, unspecified: Secondary | ICD-10-CM

## 2020-04-01 DIAGNOSIS — I10 Essential (primary) hypertension: Secondary | ICD-10-CM

## 2020-04-01 DIAGNOSIS — Z8249 Family history of ischemic heart disease and other diseases of the circulatory system: Secondary | ICD-10-CM

## 2020-04-01 DIAGNOSIS — I2510 Atherosclerotic heart disease of native coronary artery without angina pectoris: Secondary | ICD-10-CM

## 2020-04-01 DIAGNOSIS — F1011 Alcohol abuse, in remission: Secondary | ICD-10-CM

## 2020-04-01 DIAGNOSIS — F17200 Nicotine dependence, unspecified, uncomplicated: Secondary | ICD-10-CM

## 2020-04-01 DIAGNOSIS — Z8619 Personal history of other infectious and parasitic diseases: Secondary | ICD-10-CM

## 2020-04-01 DIAGNOSIS — E119 Type 2 diabetes mellitus without complications: Secondary | ICD-10-CM

## 2020-04-01 DIAGNOSIS — Z1211 Encounter for screening for malignant neoplasm of colon: Secondary | ICD-10-CM

## 2020-04-01 DIAGNOSIS — Z0000 Encounter for general adult medical examination without abnormal findings: Secondary | ICD-10-CM | POA: Diagnosis not present

## 2020-04-01 DIAGNOSIS — K863 Pseudocyst of pancreas: Secondary | ICD-10-CM

## 2020-04-01 DIAGNOSIS — Z7252 High risk homosexual behavior: Secondary | ICD-10-CM

## 2020-04-01 DIAGNOSIS — E291 Testicular hypofunction: Secondary | ICD-10-CM

## 2020-04-01 NOTE — Progress Notes (Signed)
   Subjective:    Patient ID: [name] [name], male    DOB: 04-Sep-1975, 45 y.o.   MRN: [id number]  HPI He is here for complete examination.  He does have underlying diabetes as well as hypogonadism and is seeing Dr. Loanne Drilling for that.  The testosterone has been stopped.  Presently he is taking Metformin and Prandin for his diabetes.  His last A1c was 8.0.  He has an underlying history of pancreatitis as well as pancreatic pseudocyst.  He was seen recently by gastroenterology.  Apparently a lipase was negative.  He does continue to drink but states that he is cut back considerably on that.  He also does smoke occasionally.  He has underlying heart disease with a previous history of MI and is followed by cardiology for this.  There is a concern about his fatigue coming from sleep apnea and apparently he is being set up for a study concerning that.  He continues on Truvada and is being taken care of at the health department for that.  They also follow-up on his history of syphilis.  He does state that his sexual activity has diminished.   Review of Systems  All other systems reviewed and are negative.      Objective:   Physical Exam Alert and in no distress. Tympanic membranes and canals are normal. Pharyngeal area is normal. Neck is supple without adenopathy or thyromegaly. Cardiac exam shows a regular sinus rhythm without murmurs or gallops. Lungs are clear to auscultation. Abdominal exam shows no masses or tenderness.      Assessment & Plan:  Routine general medical examination at a health care facility  Coronary artery disease involving native coronary artery of native heart, unspecified whether angina present  Family history of premature CAD  Hyperlipidemia, unspecified hyperlipidemia type  Primary hypertension  History of syphilis  High risk homosexual behavior  Type 2 diabetes mellitus without complication, without long-term current use of insulin ([place]) - Plan: Ambulatory referral  to Ophthalmology  Pancreatic pseudocyst  Hypogonadism in male  Tobacco use disorder  Alcohol use disorder, mild, in early remission  Screening for colon cancer - Plan: Cologuard  Strongly encouraged him to abstain from all alcohol and also stop smoking.  He will follow up with gastroenterology concerning the pancreatic pseudocyst.  Continue on Truvada through the health department.  Discussed the cardiology evaluation and possible OSA.  Explained that some of his care can be switched back to me when he is ready concerning his diabetes, possible OSA but would definitely want to keep endocrinology as well as cardiology in the loop.  Hopefully the pancreatic pseudocyst etc. will resolve

## 2020-04-06 ENCOUNTER — Ambulatory Visit (HOSPITAL_BASED_OUTPATIENT_CLINIC_OR_DEPARTMENT_OTHER): Payer: 59 | Admitting: Cardiovascular Disease

## 2020-04-08 ENCOUNTER — Inpatient Hospital Stay: Admission: RE | Admit: 2020-04-08 | Payer: 59 | Source: Ambulatory Visit

## 2020-04-15 ENCOUNTER — Ambulatory Visit: Payer: 59 | Admitting: Endocrinology

## 2020-04-21 LAB — COLOGUARD: Cologuard: NEGATIVE

## 2020-04-26 LAB — COLOGUARD

## 2020-05-02 NOTE — Progress Notes (Signed)
lvm for pt to call back for negative cologuard results . [name] [name]

## 2020-05-06 LAB — HM DIABETES EYE EXAM

## 2020-05-23 ENCOUNTER — Telehealth: Payer: Self-pay | Admitting: Family Medicine

## 2020-05-23 MED ORDER — METFORMIN HCL 500 MG PO TABS: 500.0000 mg | ORAL_TABLET | Freq: Two times a day (BID) | ORAL | 0 refills | Status: DC

## 2020-05-23 NOTE — Telephone Encounter (Signed)
Pt stated that he is traveling out of town and needs a refill on Metformin sent to [place] [address] [place]  [phone]

## 2020-06-20 ENCOUNTER — Other Ambulatory Visit: Payer: Self-pay | Admitting: Family Medicine

## 2020-06-20 DIAGNOSIS — F418 Other specified anxiety disorders: Secondary | ICD-10-CM

## 2020-06-23 NOTE — Telephone Encounter (Signed)
Is this okay to refill? 

## 2020-07-10 ENCOUNTER — Other Ambulatory Visit: Payer: Self-pay

## 2020-07-10 ENCOUNTER — Telehealth: Payer: Self-pay

## 2020-07-10 DIAGNOSIS — E109 Type 1 diabetes mellitus without complications: Secondary | ICD-10-CM

## 2020-07-10 MED ORDER — ONETOUCH DELICA LANCETS 33G MISC: 1.0000 | Freq: Every day | 3 refills | Status: DC

## 2020-07-10 MED ORDER — ONETOUCH VERIO VI STRP: ORAL_STRIP | 12 refills | Status: DC

## 2020-07-10 NOTE — Telephone Encounter (Signed)
Need a hand written script for One touch verio glucose meter. Qty 1 and 0 refills . So I can fax it to pharmacy.

## 2020-07-10 NOTE — Telephone Encounter (Signed)
Pt. Called stating he left his glucose monitor in the hotel room and needs a new one called in. He also needs the test stirps and lancets for the one touch verio. Pt. Last apt was 04/01/20.

## 2020-07-11 ENCOUNTER — Telehealth: Payer: Self-pay

## 2020-07-11 NOTE — Telephone Encounter (Signed)
Called pt to advise that he has a script written for his glucose meter. Unable to send through e-scribe.  LVM for pt to call back and advise how he would like to do meaning place out in mail or if he will come by before 4:30 today. [place]

## 2020-07-17 ENCOUNTER — Other Ambulatory Visit: Payer: Self-pay | Admitting: Family Medicine

## 2020-07-28 ENCOUNTER — Telehealth: Payer: Self-pay | Admitting: Internal Medicine

## 2020-07-28 NOTE — Telephone Encounter (Signed)
Spoke with pt, he is getting lightheaded when standing from bending over or standing from a sitting position, it does not happen every time. He reports this has been going on for several weeks. He is also having concentration issues and headaches, that get better with increased fluid intake. He has recently been diagnosed with diabetes and pancarditis. He does not have a way of checking his bp but was encouraged  To get a bp machine at the pharmacy. He is aware dr Margaretann Loveless is not in the office the rest of the month but he could have a virtual visit to discuss his issues. He agreed to get a bp monitor and start checking his bp epically when dizzy. My chart video visit schedule for next week. Patient will increase fluid intake and add a little salt to his diet daily. Pt agreed with this plan.

## 2020-07-28 NOTE — Telephone Encounter (Signed)
PT IS REQUESTING AN APPT FOR THIS MONTH FOR 6 MONTH FOLLOW UP W/ EKG DUE TO LIGHTHEADEDNESS SOMETIMES UPON STANDING, HEADACHES,AND CONFUSION,  NO AVAILABLE APPT UNTIL June PT REQ TO BE WORKED IN THIS MONTH

## 2020-08-05 ENCOUNTER — Telehealth (INDEPENDENT_AMBULATORY_CARE_PROVIDER_SITE_OTHER): Payer: 59 | Admitting: Internal Medicine

## 2020-08-05 ENCOUNTER — Encounter: Payer: Self-pay | Admitting: Internal Medicine

## 2020-08-05 VITALS — BP 101/70 | Ht 63.0 in | Wt 160.0 lb

## 2020-08-05 DIAGNOSIS — Z720 Tobacco use: Secondary | ICD-10-CM

## 2020-08-05 DIAGNOSIS — R42 Dizziness and giddiness: Secondary | ICD-10-CM

## 2020-08-05 DIAGNOSIS — E785 Hyperlipidemia, unspecified: Secondary | ICD-10-CM

## 2020-08-05 DIAGNOSIS — G4719 Other hypersomnia: Secondary | ICD-10-CM

## 2020-08-05 DIAGNOSIS — I10 Essential (primary) hypertension: Secondary | ICD-10-CM

## 2020-08-05 DIAGNOSIS — Z79899 Other long term (current) drug therapy: Secondary | ICD-10-CM

## 2020-08-05 DIAGNOSIS — I2510 Atherosclerotic heart disease of native coronary artery without angina pectoris: Secondary | ICD-10-CM

## 2020-08-05 MED ORDER — LISINOPRIL 20 MG PO TABS: 20.0000 mg | ORAL_TABLET | Freq: Every day | ORAL | 3 refills | Status: DC

## 2020-08-05 NOTE — Progress Notes (Addendum)
Virtual Visit via Video Note   This visit type was conducted due to national recommendations for restrictions regarding the COVID-19 Pandemic (e.g. social distancing) in an effort to limit this patient's exposure and mitigate transmission in our community.  Due to his co-morbid illnesses, this patient is at least at moderate risk for complications without adequate follow up.  This format is felt to be most appropriate for this patient at this time.  All issues noted in this document were discussed and addressed.  A limited physical exam was performed with this format.  Please refer to the patient's chart for his consent to telehealth for [facility].  Date:  08/05/2020   ID:  [name] [name], DOB 28-Nov-1975, MRN [id number] The patient was identified using 2 identifiers.  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 He was found to have a subtotal LAD which had occluded at the time of his intervention and he had a stent placed. EF normal in 06/2019. He also has a history of depression, hyperlipidemia, hypertension, prior neurosyphilis, pancreatitis with alcohol use, tobacco abuse. He endorses daytime fatigue. Planning for sleep study.  Has been feeling dizzy, lightheaded and dehydrated. BP in 100s/70s. The patient denies chest pain, chest pressure, dyspnea at rest or with exertion, palpitations, PND, orthopnea, or leg swelling. Denies cough, fever, chills. Denies nausea, vomiting. Denies syncope or presyncope.    The patient does not have symptoms concerning for COVID-19 infection (fever, chills, cough, or new shortness of breath).     Past Medical History:  Diagnosis Date   Anxiety    Coronary artery disease    a. 05/2014 OOH MI/PCI: LM nl, LAD 40p, 99p (3.0x28 Synergy DES) w/ R->L Collaterals, LCX nl, RCA 30p/d, EF 55% w/ sev distal inf/ant HK.   Depression    Diabetes mellitus without complication ([place])    Erectile dysfunction    H/O echocardiogram    a. 05/2014 Echo: Ef 45-50%, sev mid antsept/ant HK.   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     Pancreatitis 10/2018   Tobacco abuse    Past Surgical History:  Procedure Laterality Date   CORONARY STENT PLACEMENT  05/24/2014   MID LAD DES    LEFT HEART CATHETERIZATION WITH CORONARY ANGIOGRAM N/A 05/24/2014   Procedure: LEFT HEART CATHETERIZATION WITH CORONARY ANGIOGRAM;  Surgeon: Jettie Booze, MD;  Location: [facility] CATH LAB;  Service: Cardiovascular;  Laterality: N/A;   PERCUTANEOUS CORONARY STENT INTERVENTION (PCI-S)  05/24/2014   Procedure: PERCUTANEOUS CORONARY STENT INTERVENTION (PCI-S);  Surgeon: Jettie Booze, MD;  Location: [facility] CATH LAB;  Service: Cardiovascular;;   PILONIDAL CYST EXCISION  1995     Current Meds  Medication Sig   albuterol (VENTOLIN HFA) 108 (90 Base) MCG/ACT inhaler Inhale 2 puffs into the lungs every 6 (six) hours as needed for wheezing or shortness of breath.   ALPRAZolam (XANAX) 0.5 MG tablet TAKE ONE TABLET BY MOUTH DAILY AS NEEDED FOR ANXIETY   aspirin EC 81 MG EC tablet Take 1 tablet (81  mg total) by mouth daily.   atorvastatin (LIPITOR) 40 MG tablet TAKE ONE TABLET BY MOUTH DAILY AT 6PM (Patient taking differently: Take 40 mg by mouth daily. TAKE ONE TABLET BY MOUTH DAILY AT 6PM)   Blood Glucose Monitoring Suppl (ONETOUCH VERIO) w/Device KIT 1 kit by Does not apply route.   glucose blood (ONETOUCH VERIO) test strip Use as instructed to check blood sugar once daily.   lisinopril-hydrochlorothiazide (ZESTORETIC) 20-12.5 MG  tablet Take 1 tablet by mouth daily. Rx for 40-25 mg daily   metFORMIN (GLUCOPHAGE) 500 MG tablet TAKE ONE TABLET BY MOUTH TWICE A DAY WITH MEALS   metoprolol tartrate (LOPRESSOR) 50 MG tablet Take 1 tablet (50 mg total) by mouth 2 (two) times daily.   Multiple Vitamin (MULTIVITAMIN WITH MINERALS) TABS tablet Take 1 tablet by mouth daily.   OneTouch Delica Lancets 67T MISC 1 each by Does not apply route daily. Check blood sugar once daily   repaglinide (PRANDIN) 0.5 MG tablet Take 1 tablet (0.5 mg total) by mouth 3 (three) times daily before meals.   Current Facility-Administered Medications for the 08/05/20 encounter (Video Visit) with Elouise Munroe, MD  Medication   testosterone cypionate (DEPOTESTOSTERONE CYPIONATE) injection 200 mg     Allergies:   Gadolinium derivatives   Social History   Tobacco Use   Smoking status: Current Every Day Smoker    Packs/day: 1.00    Years: 23.00    Pack years: 23.00    Types: Cigarettes   Smokeless tobacco: Never Used   Tobacco comment: currently smoking 0.5 ppd, declines patch  Vaping Use   Vaping Use: Never used  Substance Use Topics   Alcohol use: Yes    Alcohol/week: 24.0 standard drinks    Types: 24 Cans of beer per week    Comment: 1/5-3/4 Fifth, 4-5 times a week.  +Guilty, cutting down; 2/4 CAGE questions.   Drug use: No     Family Hx: The patient's family history includes Colon cancer in his father; Diabetes in his father and maternal grandmother; Diabetes type II in his father; Heart disease (age of onset: 7) in his father; Hypertension in his maternal grandfather and paternal grandfather; Peripheral Artery Disease in his father; Thyroid disease in his mother. There is no history of Cancer, Stroke, Other, Pancreatic cancer, or Esophageal cancer.  ROS:   Please see the history of present illness.     All other systems reviewed and are negative.   Prior CV studies:   The following studies were reviewed  today:    Labs/Other Tests and Data Reviewed:    EKG:  No ECG reviewed.  Recent Labs: 12/09/2019: Magnesium 1.4 02/08/2020: TSH 1.48 03/13/2020: ALT 16; BUN 10; Creatinine, Ser 0.72; Hemoglobin 13.0; Platelets 303.0; Potassium 4.0; Sodium 134   Recent Lipid Panel Lab Results  Component Value Date/Time   CHOL 95 12/09/2019 12:30 PM   CHOL 178 05/30/2019 09:35 AM   TRIG 81.0 03/13/2020 10:25 AM   HDL 31 (L) 12/09/2019 12:30 PM   HDL 73 05/30/2019 09:35 AM   CHOLHDL 3.1 12/09/2019 12:30 PM   LDLCALC 53 12/09/2019 12:30 PM   LDLCALC 92 05/30/2019 09:35 AM    Wt Readings from Last 3 Encounters:  08/05/20 160 lb (72.6 kg)  04/01/20 124 lb 9.6 oz (56.5 kg)  03/13/20 120 lb (54.4 kg)     Risk Assessment/Calculations:      Objective:    Vital Signs:  BP 101/70   Ht 5' 3"  (1.6 m)  Wt 160 lb (72.6 kg)   BMI 28.34 kg/m    VITAL SIGNS:  reviewed GEN:  no acute distress EYES:  sclerae anicteric, EOMI - Extraocular Movements Intact RESPIRATORY:  normal respiratory effort, symmetric expansion CARDIOVASCULAR:  no peripheral edema SKIN:  no rash, lesions or ulcers. MUSCULOSKELETAL:  no obvious deformities. NEURO:  alert and oriented x 3, no obvious focal deficit PSYCH:  normal affect  ASSESSMENT & PLAN:    1. Coronary artery disease involving native coronary artery of native heart without angina pectoris   2. Essential hypertension   3. Dizziness   4. Hyperlipidemia, unspecified hyperlipidemia type   5. Tobacco abuse   6. Medication management     Coronary artery disease involving native coronary artery of native heart without angina pectoris - continue ASA 81 mg daily. Continue BB, statin.  Essential hypertension Dizziness - has hypotension and dizziness, and feels dehydrated. Hx of pancreatitis as well. Will stop HCTZ and monitor BP. He is travelling to [place] soon to be with his father who has stage 4 lung cancer. He will monitor and contact our office if  BP is elevated or if symptoms to not improve. Continue lisinopril and stop combination pill. Continue BB.   Hyperlipidemia, unspecified hyperlipidemia type -continue statin, lipids with good control   Tobacco abuse - reports being quit for 1 year, requesting ceramic inhaler which we do not have in our office. I am unaware of what this is, and would recommend he discuss with his PCP.   EXCESSIVE DAYTIME SLEEPINESS - EPWORTH SCORE 13 - WILL ARRANGE SLEEP STUDY.  COVID-19 Education: The signs and symptoms of COVID-19 were discussed with the patient and how to seek care for testing (follow up with PCP or arrange E-visit).  The importance of social distancing was discussed today.  Time:   Today, I have spent 15 minutes with the patient with telehealth technology discussing the above problems.     Medication Adjustments/Labs and Tests Ordered: Current medicines are reviewed at length with the patient today.  Concerns regarding medicines are outlined above.   Patient Instructions  Medication Instructions:  STOP: LISINOPRIL-HCTZ START: JUST LISINOPRIL 45m DAILY  *If you need a refill on your cardiac medications before your next appointment, please call your pharmacy*  Follow-Up: At [facility] you and your health needs are our priority.  As part of our continuing mission to provide you with exceptional heart care, we have created designated Provider Care Teams.  These Care Teams include your primary Cardiologist (physician) and Advanced Practice Providers (APPs -  Physician Assistants and Nurse Practitioners) who all work together to provide you with the care you need, when you need it.  Your next appointment:   October 11th 2022 at 1:20pm   The format for your next appointment:   In Person  Provider:   GCherlynn Kaiser MD     Signed, GElouise Munroe MD  08/05/2020 8:43 AM    [facility]

## 2020-08-05 NOTE — Patient Instructions (Signed)
Medication Instructions:  STOP: LISINOPRIL-HCTZ START: JUST LISINOPRIL 84m DAILY  *If you need a refill on your cardiac medications before your next appointment, please call your pharmacy*  Follow-Up: [facility] and your [facility] needs are our [facility].  As part of our continuing mission to provide you with exceptional heart care, we have created designated Provider Care Teams.  These Care Teams include your primary Cardiologist (physician) and Advanced Practice Providers (APPs -  Physician Assistants and Nurse Practitioners) who all work together to provide you with the care you need, when you need it.  Your next appointment:   October 11th 2022 at 1:20pm   The format for your next appointment:   In Person  Provider:   GCherlynn Kaiser MD

## 2020-08-11 ENCOUNTER — Telehealth: Payer: Self-pay

## 2020-08-11 DIAGNOSIS — F418 Other specified anxiety disorders: Secondary | ICD-10-CM

## 2020-08-11 MED ORDER — ALPRAZOLAM 0.5 MG PO TABS
ORAL_TABLET | ORAL | 0 refills | Status: DC
Start: 2020-08-11 — End: 2021-07-06

## 2020-08-11 NOTE — Telephone Encounter (Signed)
Pt. Called stating he is going to [facility] to be with his dad that has stage 4 cancer and also take care of his Aunt that is having shoulder surgery for about 3 months. He wanted to know if there was anyway you could send him a 90 day supply of his Xanax to [facility] at [place]. They wont fill controlled substance in another state.

## 2020-08-16 ENCOUNTER — Other Ambulatory Visit: Payer: Self-pay | Admitting: Family Medicine

## 2020-08-18 ENCOUNTER — Other Ambulatory Visit: Payer: Self-pay | Admitting: Family Medicine

## 2020-08-18 NOTE — Telephone Encounter (Signed)
He is now seeing Dr. Loanne Drilling and therefore should get this prescription renewed there.

## 2020-09-21 ENCOUNTER — Other Ambulatory Visit: Payer: Self-pay | Admitting: Family Medicine

## 2020-10-08 ENCOUNTER — Telehealth: Payer: Self-pay | Admitting: *Deleted

## 2020-10-08 ENCOUNTER — Other Ambulatory Visit: Payer: Self-pay | Admitting: Internal Medicine

## 2020-10-08 DIAGNOSIS — I10 Essential (primary) hypertension: Secondary | ICD-10-CM

## 2020-10-08 DIAGNOSIS — R400 Somnolence: Secondary | ICD-10-CM

## 2020-10-08 NOTE — Telephone Encounter (Signed)
Prior Authorization for Home sleep study sent to [facility] via web portal. Tracking Number [id number].

## 2020-10-13 IMAGING — DX DG ABDOMEN ACUTE W/ 1V CHEST
3 series · 3 of 3 positions shown · non-contrast
Comparison: 11/10/2017 chest radiograph.

CLINICAL DATA: Abdominal pain, vomiting and distention

EXAM:
DG ABDOMEN ACUTE W/ 1V CHEST

[chest pa]
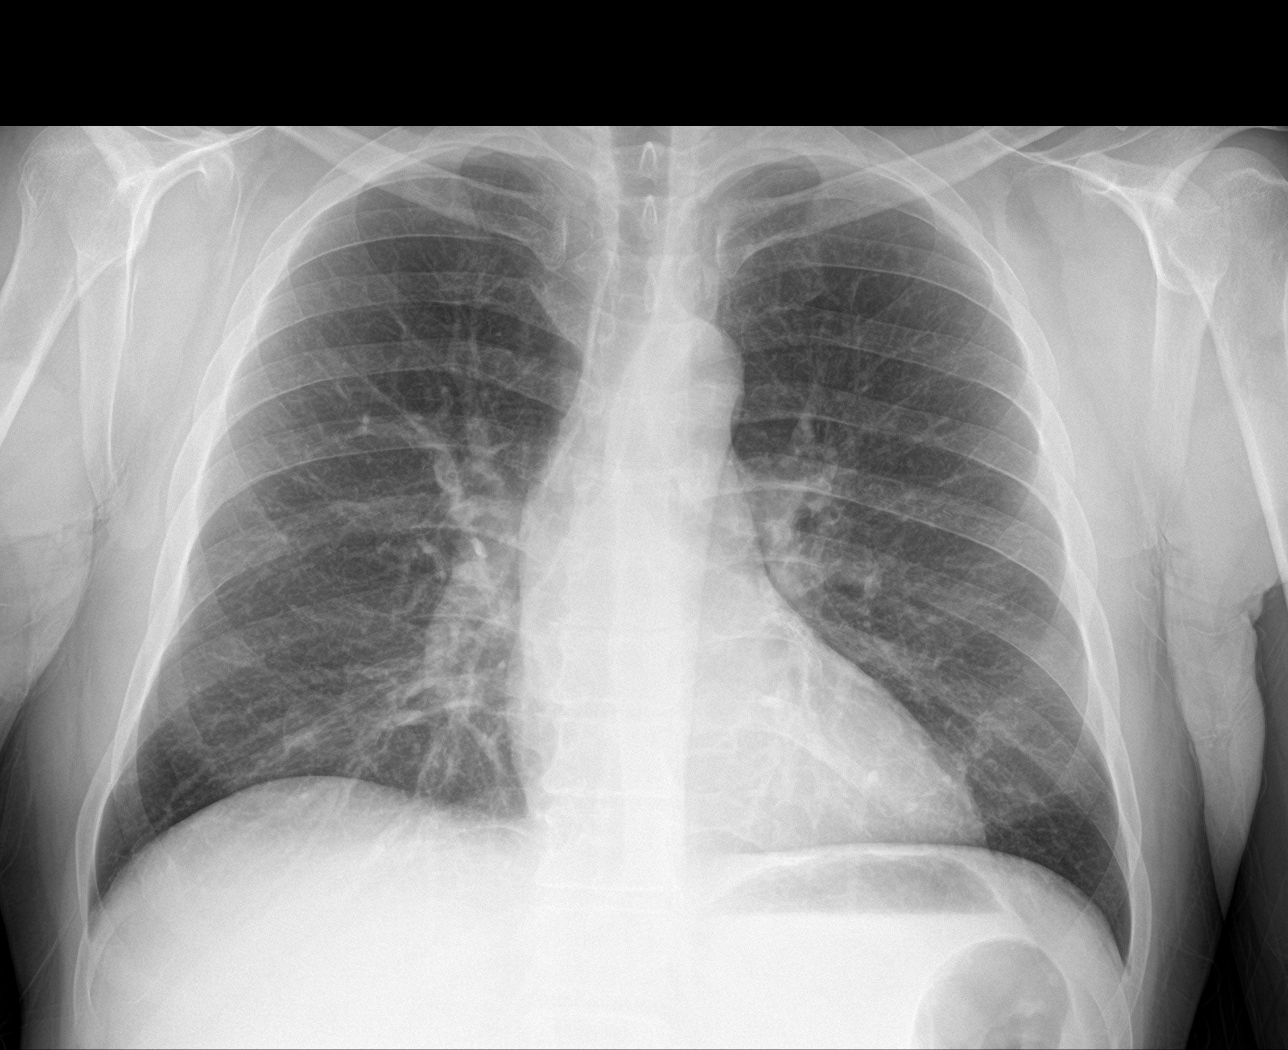

[abdomen erect]
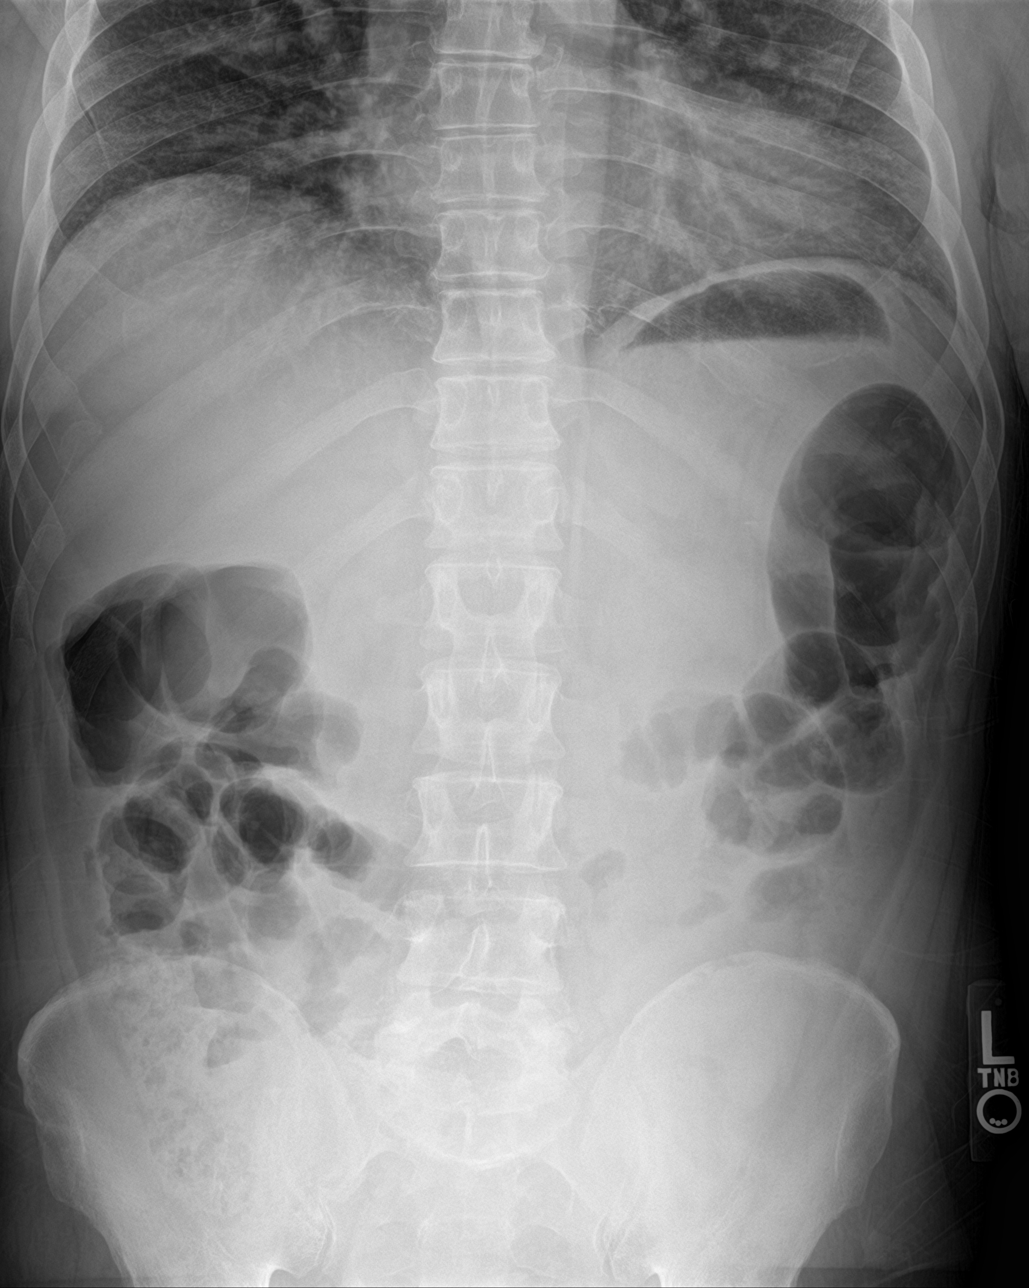

[abdomen kub]
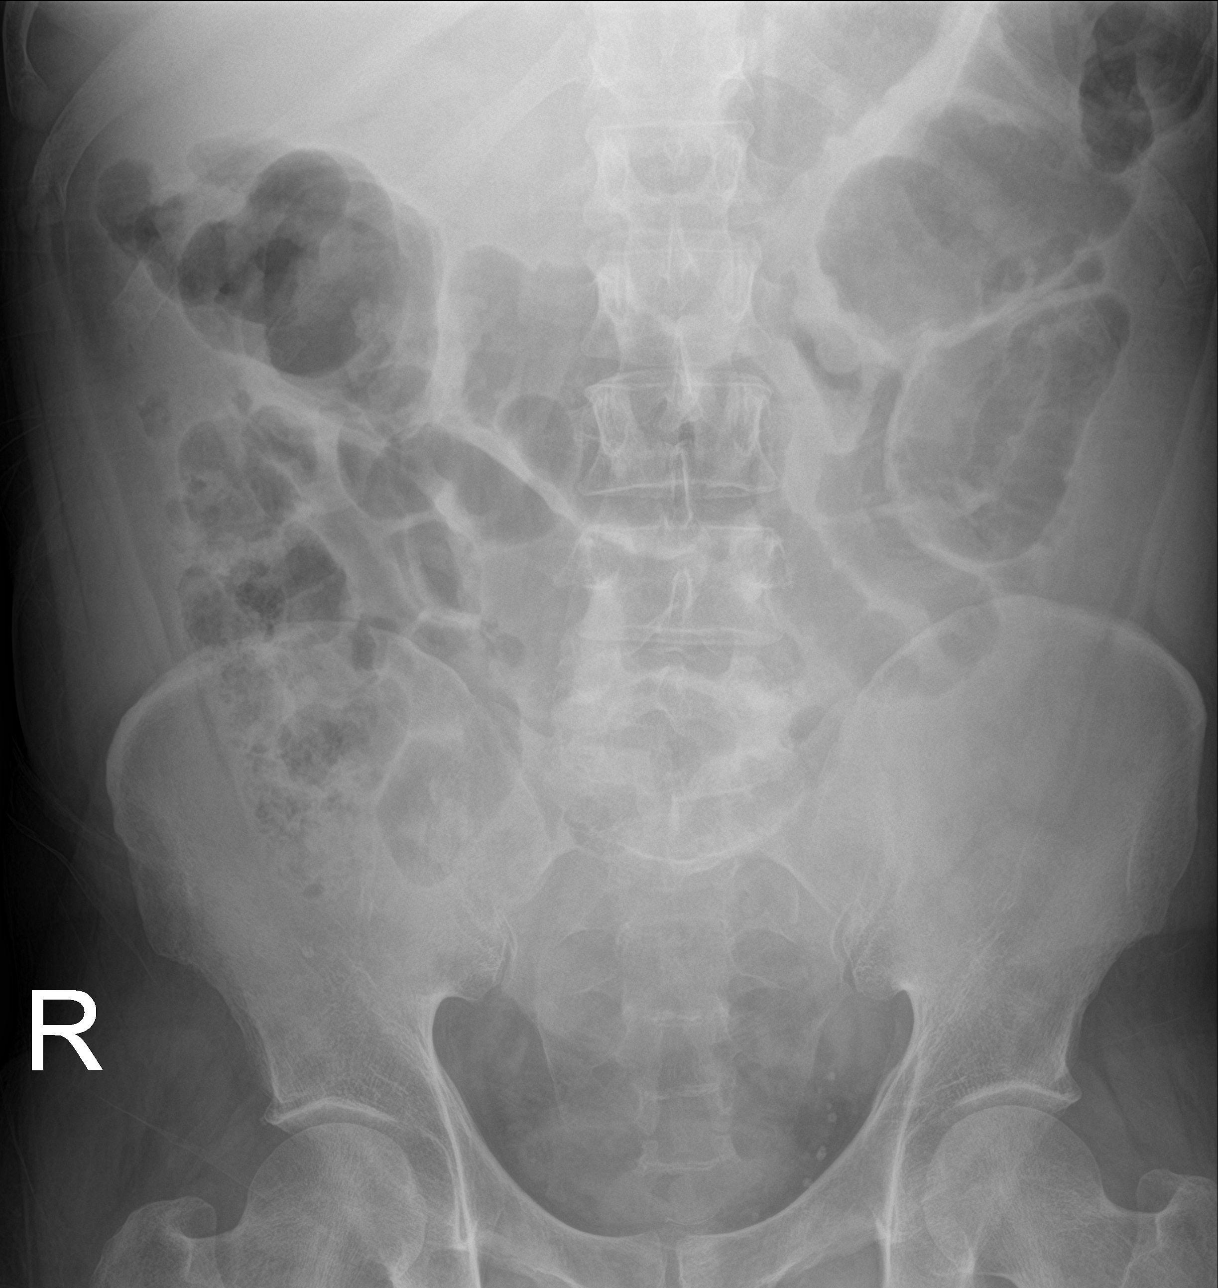

[3 of 3 positions shown; findings below may reference images not displayed]

FINDINGS: Stable cardiomediastinal silhouette with normal heart size. No
pneumothorax. No pleural effusion. Lungs appear clear, with no acute
consolidative airspace disease and no pulmonary edema. Fluid level
in the stomach. No disproportionately dilated small bowel loops.
Mild stool and moderate gas in the large bowel. No evidence of
pneumatosis or pneumoperitoneum. No radiopaque nephrolithiasis.
IMPRESSION: 1. No active cardiopulmonary disease.
2. Nonobstructive bowel gas pattern. Nonspecific fluid level in the
stomach. Moderate gas and mild stool in the colon.

## 2020-10-14 NOTE — Telephone Encounter (Signed)
Received a denial from [facility] for HST. Stated MD did not say patient having daytime somnolence, nor did they have a epworth scale. I resent the office note high lightning the note where Dr Margaretann Loveless does mention the patient having daytime fatigue.  Hopefully his will get the denial overturned. I also pointed out that somnolence is the primary diagnosis.

## 2020-11-04 NOTE — Telephone Encounter (Signed)
Received a appeal denial from [facility] for HST. Ordering provider, Dr Margaretann Loveless will be notified.

## 2020-12-04 ENCOUNTER — Other Ambulatory Visit: Payer: Self-pay | Admitting: Family Medicine

## 2020-12-16 ENCOUNTER — Ambulatory Visit: Payer: 59 | Admitting: Internal Medicine

## 2020-12-31 ENCOUNTER — Encounter: Payer: Self-pay | Admitting: Endocrinology

## 2021-01-05 ENCOUNTER — Telehealth: Payer: 59 | Admitting: Internal Medicine

## 2021-01-17 ENCOUNTER — Encounter: Payer: Self-pay | Admitting: Family Medicine

## 2021-04-01 ENCOUNTER — Encounter: Payer: Self-pay | Admitting: Internal Medicine

## 2021-04-01 ENCOUNTER — Encounter: Payer: Self-pay | Admitting: Family Medicine

## 2021-04-12 ENCOUNTER — Other Ambulatory Visit: Payer: Self-pay | Admitting: Internal Medicine

## 2021-04-29 ENCOUNTER — Encounter: Payer: Self-pay | Admitting: Family Medicine

## 2021-05-18 ENCOUNTER — Encounter: Payer: Self-pay | Admitting: Family Medicine

## 2021-05-26 ENCOUNTER — Other Ambulatory Visit: Payer: Self-pay

## 2021-05-26 ENCOUNTER — Ambulatory Visit (INDEPENDENT_AMBULATORY_CARE_PROVIDER_SITE_OTHER): Payer: Managed Care, Other (non HMO) | Admitting: Endocrinology

## 2021-05-26 VITALS — BP 126/84 | HR 93 | Ht 63.0 in | Wt 135.6 lb

## 2021-05-26 DIAGNOSIS — E119 Type 2 diabetes mellitus without complications: Secondary | ICD-10-CM | POA: Diagnosis not present

## 2021-05-26 LAB — POCT GLYCOSYLATED HEMOGLOBIN (HGB A1C): Hemoglobin A1C: 10.9 % — AB (ref 4.0–5.6)

## 2021-05-26 MED ORDER — INSULIN LISPRO (1 UNIT DIAL) 100 UNIT/ML (KWIKPEN): 7.0000 [IU] | PEN_INJECTOR | Freq: Three times a day (TID) | SUBCUTANEOUS | 3 refills | Status: DC

## 2021-05-26 MED ORDER — FREESTYLE LIBRE 2 READER DEVI: 1.0000 | Freq: Once | 1 refills | Status: AC

## 2021-05-26 MED ORDER — FREESTYLE LIBRE 2 SENSOR MISC: 1.0000 | 3 refills | Status: DC

## 2021-05-26 MED ORDER — BASAGLAR KWIKPEN 100 UNIT/ML ~~LOC~~ SOPN: 20.0000 [IU] | PEN_INJECTOR | Freq: Every day | SUBCUTANEOUS | 3 refills | Status: DC

## 2021-05-26 NOTE — Patient Instructions (Addendum)
check your blood sugar once a day.  vary the time of day when you check, between before the 3 meals, and at bedtime.  also check if you have symptoms of your blood sugar being too high or too low.  please keep a record of the readings and bring it to your next appointment here (or you can bring the meter itself).  You can write it on any piece of paper.  please call us sooner if your blood sugar goes below 70, or if you have a lot of readings over 200.  ?I have sent 2 prescriptions to your pharmacy: to add Basaglar, and for the continuous glucose monitor.   ?Please continue the same other diabetes medications. ?Please come back for a follow-up appointment in 1 month.   ?

## 2021-05-26 NOTE — Progress Notes (Signed)
? ?Subjective:  ? ? Patient ID: [name] [name], male    DOB: 01/06/1976, 46 y.o.   MRN: [id number] ? 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  no cbg record, but states cbg's vary from 200-400.  It is in general higher as the day goes on.  He takes humalog, 7-10 units 3 times a day (just before each meal).   ?Past Medical History:  ?Diagnosis Date  ? Anxiety   ? Coronary artery disease   ? a. 05/2014 OOH MI/PCI: LM nl, LAD 40p, 99p (3.0x28 Synergy DES) w/ R->L Collaterals, LCX nl, RCA 30p/d, EF 55% w/ sev distal inf/ant HK.  ? Depression   ? Diabetes mellitus without complication ([place])   ? Erectile dysfunction   ? H/O echocardiogram   ? a. 05/2014 Echo: Ef 45-50%, sev mid antsept/ant HK.  ? Hyperlipidemia   ? Hypertension   ? medication in 2012, stress as a big component  ? Neurosyphilis in male   ? hospitalization 04/19/2014.    ? Pancreatitis 10/2018  ? Tobacco abuse   ? ? ?Past Surgical History:  ?Procedure Laterality Date  ? CORONARY STENT PLACEMENT  05/24/2014  ? MID LAD DES   ? LEFT HEART CATHETERIZATION WITH CORONARY ANGIOGRAM N/A 05/24/2014  ? Procedure: LEFT HEART CATHETERIZATION WITH CORONARY ANGIOGRAM;  Surgeon: Jettie Booze, MD;  Location: [facility] CATH LAB;  Service: Cardiovascular;  Laterality: N/A;  ? PERCUTANEOUS CORONARY STENT INTERVENTION (PCI-S)  05/24/2014  ? Procedure: PERCUTANEOUS CORONARY STENT INTERVENTION (PCI-S);  Surgeon: Jettie Booze, MD;  Location: [facility] CATH LAB;  Service: Cardiovascular;;  ? PILONIDAL CYST EXCISION  1995  ? ? ?Social History  ? ?Socioeconomic History  ? Marital status: Single   ?  Spouse name: Not on file  ? Number of children: Not on file  ? Years of education: Not on file  ? Highest education level: Not on file  ?Occupational History  ? Occupation: Secondary school teacher  ?Tobacco Use  ? Smoking status: Every Day  ?  Packs/day: 1.00  ?  Years: 23.00  ?  Pack years: 23.00  ?  Types: Cigarettes  ? Smokeless tobacco: Never  ? Tobacco comments:  ?  currently smoking 0.5 ppd, declines patch  ?Vaping Use  ? Vaping Use: Never used  ?Substance and Sexual Activity  ? Alcohol use: Yes  ?  Alcohol/week: 24.0 standard drinks  ?  Types: 24 Cans of beer per week  ?  Comment: 1/5-3/4 Fifth, 4-5 times a week.  +Guilty, cutting down; 2/4 CAGE questions.  ? Drug use: No  ? Sexual activity: Yes  ?  Partners: Male  ?Other Topics Concern  ? Not on file  ?Social History Narrative  ? Lives at home with roommate, exercise at gym in apartment complex, diet - no discretion.      ? ?Social Determinants of Health  ? ?Financial Resource Strain: Not on file  ?Food Insecurity: Not on file  ?Transportation Needs: Not on file  ?Physical Activity: Not on file  ?Stress: Not on file  ?Social Connections: Not on file  ?Intimate Partner Violence: Not on file  ? ? ?Current Outpatient Medications on File Prior to  Visit  ?Medication Sig Dispense Refill  ? albuterol (VENTOLIN HFA) 108 (90 Base) MCG/ACT inhaler Inhale 2 puffs into the lungs every 6 (six) hours as needed for wheezing or shortness of breath. 8 g 0  ? ALPRAZolam (XANAX) 0.5 MG tablet TAKE ONE TABLET BY MOUTH DAILY AS NEEDED FOR ANXIETY 90 tablet 0  ? aspirin EC 81 MG EC tablet Take 1 tablet (81 mg total) by mouth daily.    ? atorvastatin (LIPITOR) 40 MG tablet TAKE ONE TABLET BY MOUTH DAILY AT 6PM (Patient taking differently: Take 40 mg by mouth daily. TAKE ONE TABLET BY MOUTH DAILY AT 6PM) 90 tablet 3  ? Blood Glucose Monitoring Suppl (ONETOUCH VERIO) w/Device KIT 1 kit by Does not apply route.    ? emtricitabine-tenofovir (TRUVADA) 200-300 MG tablet Take 1  tablet by mouth daily.    ? glucose blood (ONETOUCH VERIO) test strip Use as instructed to check blood sugar once daily. 100 each 12  ? lisinopril (ZESTRIL) 20 MG tablet Take 1 tablet (20 mg total) by mouth daily. 90 tablet 3  ? metFORMIN (GLUCOPHAGE) 500 MG tablet TAKE 1 TABLET BY MOUTH TWICE DAILY WITH MEALS 60 tablet 1  ? metoprolol tartrate (LOPRESSOR) 50 MG tablet Take 1 tablet (50 mg total) by mouth 2 (two) times daily. 180 tablet 3  ? Multiple Vitamin (MULTIVITAMIN WITH MINERALS) TABS tablet Take 1 tablet by mouth daily.    ? OneTouch Delica Lancets 67R MISC 1 each by Does not apply route daily. Check blood sugar once daily 100 each 3  ? repaglinide (PRANDIN) 0.5 MG tablet Take 1 tablet (0.5 mg total) by mouth 3 (three) times daily before meals. 90 tablet 11  ? ?Current Facility-Administered Medications on File Prior to Visit  ?Medication Dose Route Frequency Provider Last Rate Last Admin  ? testosterone cypionate (DEPOTESTOSTERONE CYPIONATE) injection 200 mg  200 mg Intramuscular Q14 Days Denita Lung, MD   200 mg at 01/17/20 0941  ? ? ?Allergies  ?Allergen Reactions  ? Gadolinium Derivatives Nausea Only  ?  MRI dye  ? ? ?Family History  ?Problem Relation Age of Onset  ? Thyroid disease Mother   ? Heart disease Father 64  ?     MI, stents  ? Peripheral Artery Disease Father   ? Diabetes type II Father   ? Diabetes Father   ? Colon cancer Father   ? Diabetes Maternal Grandmother   ? Hypertension Maternal Grandfather   ? Hypertension Paternal Grandfather   ? Cancer Neg Hx   ? Stroke Neg Hx   ? Other Neg Hx   ?     hypogonadism  ? Pancreatic cancer Neg Hx   ? Esophageal cancer Neg Hx   ? ? ?BP 126/84 (BP Location: Left Arm, Patient Position: Sitting, Cuff Size: Normal)   Pulse 93   Ht _0  (1.6 m)   Wt 135 lb 9.6 oz (61.5 kg)   SpO2 96%   BMI 24.02 kg/m?  ? ? ? ?Review of Systems ? ?   ?Objective:  ? Physical Exam ?VITAL SIGNS:  See vs page ?GENERAL: no distress ? ? ? ?Lab Results  ?Component Value  Date  ? HGBA1C 10.9 (A) 05/26/2021  ? ?   ?Assessment & Plan:  ?Insulin-requiring type 2 DM: uncontrolled ? ?Patient Instructions  ?check your blood sugar once a day.  vary the time of day when you check, between before the 3 meals, and at bedtime.  also check if you  have symptoms of your blood sugar being too high or too low.  please keep a record of the readings and bring it to your next appointment here (or you can bring the meter itself).  You can write it on any piece of paper.  please call us sooner if your blood sugar goes below 70, or if you have a lot of readings over 200.  ?I have sent 2 prescriptions to your pharmacy: to add Basaglar, and for the continuous glucose monitor.   ?Please continue the same other diabetes medications. ?Please come back for a follow-up appointment in 1 month.   ? ? ?

## 2021-05-28 ENCOUNTER — Encounter: Payer: Self-pay | Admitting: Endocrinology

## 2021-05-29 ENCOUNTER — Other Ambulatory Visit: Payer: Self-pay

## 2021-05-29 DIAGNOSIS — E119 Type 2 diabetes mellitus without complications: Secondary | ICD-10-CM

## 2021-05-29 MED ORDER — INSULIN LISPRO (1 UNIT DIAL) 100 UNIT/ML (KWIKPEN): 7.0000 [IU] | PEN_INJECTOR | Freq: Three times a day (TID) | SUBCUTANEOUS | 3 refills | Status: DC

## 2021-05-29 MED ORDER — BASAGLAR KWIKPEN 100 UNIT/ML ~~LOC~~ SOPN: 20.0000 [IU] | PEN_INJECTOR | Freq: Every day | SUBCUTANEOUS | 3 refills | Status: DC

## 2021-06-02 NOTE — Progress Notes (Signed)
?Cardiology Office Note:   ? ?Date:  06/03/2021  ? ?ID:  [name] [name], DOB [birth year], MRN [id number] ? ?PCP:  Denita Lung, MD  ?Cardiologist:  Elouise Munroe, MD  ?Electrophysiologist:  None  ? ?Referring MD: Denita Lung, MD  ? ?Chief Complaint/Reason for Referral: ?CAD, prior anterior infarct, f/u dizziness/lightheadedness ? ?History of Present Illness:   ? ?[name] [name] is a 46 y.o. 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 He was found to have a subtotal LAD which had occluded at the time of his intervention and he had a stent placed. EF normal in 06/2019. He also has a history of depression, hyperlipidemia, hypertension, prior neurosyphilis, pancreatitis with alcohol use, tobacco abuse. He endorses daytime fatigue. Sleep study declined by his insurance. Fatigue is much better so may be able to defer indefinitely per patient request.  ? ?He plans to move to [facility] where his family is located.  His father passed away last year.  I expressed my condolences.  He is here for a cardiovascular check prior to [place] out of state. ? ?The patient denies chest pain, chest pressure, dyspnea at rest or with exertion, palpitations, PND, orthopnea, or leg swelling. Denies cough, fever, chills. Denies nausea, vomiting. Denies syncope or presyncope.  ? ?Past Medical History:  ?Diagnosis Date  ? Anxiety   ? Coronary artery disease   ? a. 05/2014 OOH MI/PCI: LM nl, LAD 40p, 99p (3.0x28 Synergy DES) w/ R->L Collaterals, LCX nl, RCA 30p/d, EF 55% w/ sev distal inf/ant HK.  ? Depression   ? Diabetes mellitus without complication ([place])   ? Erectile dysfunction   ? H/O echocardiogram   ? a. 05/2014 Echo: Ef 45-50%, sev mid antsept/ant HK.  ? Hyperlipidemia   ? Hypertension   ? medication in 2012, stress as a big component  ? Neurosyphilis in male   ? hospitalization 04/19/2014.    ? Pancreatitis 10/2018  ? Tobacco abuse   ? ? ?Past Surgical History:  ?Procedure  Laterality Date  ? CORONARY STENT PLACEMENT  05/24/2014  ? MID LAD DES   ? LEFT HEART CATHETERIZATION WITH CORONARY ANGIOGRAM N/A 05/24/2014  ? Procedure: LEFT HEART CATHETERIZATION WITH CORONARY ANGIOGRAM;  Surgeon: Jettie Booze, MD;  Location: [facility] CATH LAB;  Service: Cardiovascular;  Laterality: N/A;  ? PERCUTANEOUS CORONARY STENT INTERVENTION (PCI-S)  05/24/2014  ? Procedure: PERCUTANEOUS CORONARY STENT INTERVENTION (PCI-S);  Surgeon: Jettie Booze, MD;  Location: [facility] CATH LAB;  Service: Cardiovascular;;  ? PILONIDAL CYST EXCISION  1995  ? ? ?Current Medications: ?Current Meds  ?Medication Sig  ? aspirin EC 81 MG EC tablet Take 1 tablet (81 mg total) by mouth daily.  ? Insulin Glargine (BASAGLAR KWIKPEN) 100 UNIT/ML Inject 20 Units into the skin daily.  ? insulin lispro (HUMALOG KWIKPEN) 100 UNIT/ML KwikPen Inject 7-10 Units into the skin 3 (three) times daily with meals. And pen needles 4/day  ? Multiple Vitamin (MULTIVITAMIN WITH MINERALS) TABS tablet Take 1 tablet by mouth daily.  ? [DISCONTINUED] lisinopril (ZESTRIL) 20 MG tablet Take 1 tablet (20 mg total) by mouth daily.  ? [DISCONTINUED] metoprolol tartrate (LOPRESSOR) 50 MG tablet Take 1 tablet (50 mg total) by mouth 2 (two) times daily.  ? ?Current Facility-Administered Medications for the 06/03/21 encounter (Office Visit) with Elouise Munroe, MD  ?Medication  ? testosterone cypionate (DEPOTESTOSTERONE CYPIONATE) injection 200 mg  ?  ? ?Allergies:   Gadolinium derivatives  ? ?Social History  ? ?  Tobacco Use  ? Smoking status: Every Day  ?  Packs/day: 1.00  ?  Years: 23.00  ?  Pack years: 23.00  ?  Types: Cigarettes  ? Smokeless tobacco: Never  ? Tobacco comments:  ?  currently smoking 0.5 ppd, declines patch  ?Vaping Use  ? Vaping Use: Never used  ?Substance Use Topics  ? Alcohol use: Yes  ?  Alcohol/week: 24.0 standard drinks  ?  Types: 24 Cans of beer per week  ?  Comment: 1/5-3/4 Fifth, 4-5 times a week.  +Guilty, cutting down; 2/4 CAGE  questions.  ? Drug use: No  ?  ? ?Family History: ?The patient's family history includes Colon cancer in his father; Diabetes in his father and maternal grandmother; Diabetes type II in his father; Heart disease (age of onset: 8) in his father; Hypertension in his maternal grandfather and paternal grandfather; Peripheral Artery Disease in his father; Thyroid disease in his mother. There is no history of Cancer, Stroke, Other, Pancreatic cancer, or Esophageal cancer. ? ?ROS:   ?Please see the history of present illness.    ?All other systems reviewed and are negative. ? ?EKGs/Labs/Other Studies Reviewed:   ? ?The following studies were reviewed today: ? ?EKG:  NSR, inferior infarct pattern. Poor R wave progression.  ? ?Imaging studies that I have independently reviewed today: n/a ? ?Recent Labs: ?No results found for requested labs within last 8760 hours.  ?Recent Lipid Panel ?   ?Component Value Date/Time  ? CHOL 95 12/09/2019 1230  ? CHOL 178 05/30/2019 0935  ? TRIG 81.0 03/13/2020 1025  ? HDL 31 (L) 12/09/2019 1230  ? HDL 73 05/30/2019 0935  ? CHOLHDL 3.1 12/09/2019 1230  ? VLDL 11 12/09/2019 1230  ? LDLCALC 53 12/09/2019 1230  ? [place] 92 05/30/2019 0935  ? ? ?Physical Exam:   ? ?VS:  BP (!) 152/98   Pulse 72   Ht 5' 4"  (1.626 m)   Wt 132 lb 3.2 oz (60 kg)   SpO2 98%   BMI 22.69 kg/m?    ? ?Wt Readings from Last 5 Encounters:  ?05/26/21 135 lb 9.6 oz (61.5 kg)  ?08/05/20 160 lb (72.6 kg)  ?04/01/20 124 lb 9.6 oz (56.5 kg)  ?03/13/20 120 lb (54.4 kg)  ?02/08/20 119 lb (54 kg)  ?  ?Constitutional: No acute distress ?Eyes: sclera non-icteric, normal conjunctiva and lids ?ENMT: normal dentition, moist mucous membranes ?Cardiovascular: regular rhythm, normal rate, no murmur. S1 and S2 normal. No jugular venous distention.  ?Respiratory: clear to auscultation bilaterally ?GI : normal bowel sounds, soft and nontender. No distention.   ?MSK: extremities warm, well perfused. No edema.  ?NEURO: grossly nonfocal  exam, moves all extremities. ?PSYCH: alert and oriented x 3, normal mood and affect.  ? ?ASSESSMENT:   ? ?1. Coronary artery disease involving native coronary artery of native heart without angina pectoris   ?2. Primary hypertension   ?3. Dizziness   ?4. Hyperlipidemia, unspecified hyperlipidemia type   ?5. Tobacco abuse   ?6. Medication management   ?7. Daytime somnolence   ? ?PLAN:   ? ?Coronary artery disease involving native coronary artery of native heart without angina pectoris ?- continue ASA 81 mg daily. Continue metoprolol 50 mg BID, atorvastatin 40 mg daily. ?  ?Essential hypertension ?Dizziness ?- BP mildly elevated today but was better at recent office visit. I have asked him to keep a log of his BP and report if consistently > 140/90. Continue lisinopril 20 mg daily. Continue  BB as above.  ?  ?Hyperlipidemia, unspecified hyperlipidemia type ?-continue statin, lipids with good control ?  ?EXCESSIVE DAYTIME SLEEPINESS ?- EPWORTH SCORE 13 ?- sleep study denied by insurance, however he feels much better and would like to defer sleep study at this time.   ? ?Total time of encounter: ?30 minutes total time of encounter, including 20 minutes spent in face-to-face patient care on the date of this encounter. This time includes coordination of care and counseling regarding above mentioned problem list. Remainder of non-face-to-face time involved reviewing chart documents/testing relevant to the patient encounter and documentation in the medical record. I have independently reviewed documentation from referring provider.  ? ?Cherlynn Kaiser, MD, [facility] ?[place]  ? ?Shared Decision Making/Informed Consent:   ?   ? ?Medication Adjustments/Labs and Tests Ordered: ?Current medicines are reviewed at length with the patient today.  Concerns regarding medicines are outlined above.  ? ?Orders Placed This Encounter  ?Procedures  ? EKG 12-Lead  ? ? ?Meds ordered this encounter  ?Medications  ?  atorvastatin (LIPITOR) 40 MG tablet  ?  Sig: TAKE ONE TABLET BY MOUTH DAILY AT 6PM  ?  Dispense:  90 tablet  ?  Refill:  3  ? lisinopril (ZESTRIL) 20 MG tablet  ?  Sig: Take 1 tablet (20 mg total) by mouth daily.  ?  Dispense:  90 ta

## 2021-06-03 ENCOUNTER — Ambulatory Visit (INDEPENDENT_AMBULATORY_CARE_PROVIDER_SITE_OTHER): Payer: Managed Care, Other (non HMO) | Admitting: Internal Medicine

## 2021-06-03 ENCOUNTER — Other Ambulatory Visit: Payer: Self-pay

## 2021-06-03 ENCOUNTER — Encounter: Payer: Self-pay | Admitting: Internal Medicine

## 2021-06-03 VITALS — BP 152/98 | HR 72 | Ht 64.0 in | Wt 132.2 lb

## 2021-06-03 DIAGNOSIS — Z720 Tobacco use: Secondary | ICD-10-CM

## 2021-06-03 DIAGNOSIS — R400 Somnolence: Secondary | ICD-10-CM

## 2021-06-03 DIAGNOSIS — I10 Essential (primary) hypertension: Secondary | ICD-10-CM | POA: Diagnosis not present

## 2021-06-03 DIAGNOSIS — E119 Type 2 diabetes mellitus without complications: Secondary | ICD-10-CM

## 2021-06-03 DIAGNOSIS — I2510 Atherosclerotic heart disease of native coronary artery without angina pectoris: Secondary | ICD-10-CM

## 2021-06-03 DIAGNOSIS — E785 Hyperlipidemia, unspecified: Secondary | ICD-10-CM | POA: Diagnosis not present

## 2021-06-03 DIAGNOSIS — R42 Dizziness and giddiness: Secondary | ICD-10-CM | POA: Diagnosis not present

## 2021-06-03 DIAGNOSIS — Z79899 Other long term (current) drug therapy: Secondary | ICD-10-CM

## 2021-06-03 MED ORDER — ATORVASTATIN CALCIUM 40 MG PO TABS: ORAL_TABLET | ORAL | 3 refills | Status: AC

## 2021-06-03 MED ORDER — LISINOPRIL 20 MG PO TABS: 20.0000 mg | ORAL_TABLET | Freq: Every day | ORAL | 3 refills | Status: DC

## 2021-06-03 MED ORDER — FREESTYLE LIBRE 2 READER DEVI: 0 refills | Status: DC

## 2021-06-03 MED ORDER — METOPROLOL TARTRATE 50 MG PO TABS: 50.0000 mg | ORAL_TABLET | Freq: Two times a day (BID) | ORAL | 3 refills | Status: DC

## 2021-06-03 NOTE — Progress Notes (Signed)
RX meter to go along with CGMs ?

## 2021-06-03 NOTE — Patient Instructions (Signed)
Medication Instructions:  ?CARDIAC MEDICATIONS REFILLED  ?*If you need a refill on your cardiac medications before your next appointment, please call your pharmacy* ? ?Follow-Up: ?At [facility], you and your health needs are our priority.  As part of our continuing mission to provide you with exceptional heart care, we have created designated Provider Care Teams.  These Care Teams include your primary Cardiologist (physician) and Advanced Practice Providers (APPs -  Physician Assistants and Nurse Practitioners) who all work together to provide you with the care you need, when you need it. ? ? ?

## 2021-06-25 ENCOUNTER — Ambulatory Visit: Payer: Self-pay | Admitting: Physician Assistant

## 2021-07-06 ENCOUNTER — Ambulatory Visit (INDEPENDENT_AMBULATORY_CARE_PROVIDER_SITE_OTHER): Payer: Managed Care, Other (non HMO) | Admitting: Family Medicine

## 2021-07-06 ENCOUNTER — Ambulatory Visit (INDEPENDENT_AMBULATORY_CARE_PROVIDER_SITE_OTHER): Payer: Managed Care, Other (non HMO) | Admitting: Endocrinology

## 2021-07-06 ENCOUNTER — Inpatient Hospital Stay (HOSPITAL_COMMUNITY)
Admission: EM | Admit: 2021-07-06 | Discharge: 2021-07-09 | DRG: 304 | Disposition: A | Discharge status: Home / Self Care | Payer: Commercial Managed Care - HMO | Attending: Internal Medicine | Admitting: Internal Medicine

## 2021-07-06 ENCOUNTER — Telehealth: Payer: Self-pay | Admitting: Family Medicine

## 2021-07-06 ENCOUNTER — Other Ambulatory Visit: Payer: Self-pay

## 2021-07-06 ENCOUNTER — Encounter: Payer: Self-pay | Admitting: Family Medicine

## 2021-07-06 VITALS — BP 146/90 | HR 68 | Ht 64.0 in | Wt 137.8 lb

## 2021-07-06 VITALS — BP 148/106 | HR 74 | Temp 98.1°F | Wt 137.4 lb

## 2021-07-06 DIAGNOSIS — I160 Hypertensive urgency: Secondary | ICD-10-CM | POA: Diagnosis not present

## 2021-07-06 DIAGNOSIS — F17200 Nicotine dependence, unspecified, uncomplicated: Secondary | ICD-10-CM | POA: Diagnosis present

## 2021-07-06 DIAGNOSIS — Z21 Asymptomatic human immunodeficiency virus [HIV] infection status: Secondary | ICD-10-CM | POA: Diagnosis present

## 2021-07-06 DIAGNOSIS — E785 Hyperlipidemia, unspecified: Secondary | ICD-10-CM | POA: Diagnosis not present

## 2021-07-06 DIAGNOSIS — E119 Type 2 diabetes mellitus without complications: Secondary | ICD-10-CM | POA: Diagnosis not present

## 2021-07-06 DIAGNOSIS — Z955 Presence of coronary angioplasty implant and graft: Secondary | ICD-10-CM

## 2021-07-06 DIAGNOSIS — B20 Human immunodeficiency virus [HIV] disease: Secondary | ICD-10-CM

## 2021-07-06 DIAGNOSIS — I10 Essential (primary) hypertension: Secondary | ICD-10-CM | POA: Diagnosis present

## 2021-07-06 DIAGNOSIS — Z7982 Long term (current) use of aspirin: Secondary | ICD-10-CM

## 2021-07-06 DIAGNOSIS — K859 Acute pancreatitis without necrosis or infection, unspecified: Secondary | ICD-10-CM | POA: Diagnosis present

## 2021-07-06 DIAGNOSIS — Z7252 High risk homosexual behavior: Secondary | ICD-10-CM

## 2021-07-06 DIAGNOSIS — Z833 Family history of diabetes mellitus: Secondary | ICD-10-CM

## 2021-07-06 DIAGNOSIS — Z794 Long term (current) use of insulin: Secondary | ICD-10-CM

## 2021-07-06 DIAGNOSIS — I2510 Atherosclerotic heart disease of native coronary artery without angina pectoris: Secondary | ICD-10-CM

## 2021-07-06 DIAGNOSIS — I248 Other forms of acute ischemic heart disease: Secondary | ICD-10-CM | POA: Diagnosis present

## 2021-07-06 DIAGNOSIS — F418 Other specified anxiety disorders: Secondary | ICD-10-CM

## 2021-07-06 DIAGNOSIS — Z8349 Family history of other endocrine, nutritional and metabolic diseases: Secondary | ICD-10-CM

## 2021-07-06 DIAGNOSIS — F32A Depression, unspecified: Secondary | ICD-10-CM | POA: Diagnosis present

## 2021-07-06 DIAGNOSIS — Z888 Allergy status to other drugs, medicaments and biological substances status: Secondary | ICD-10-CM

## 2021-07-06 DIAGNOSIS — N529 Male erectile dysfunction, unspecified: Secondary | ICD-10-CM | POA: Diagnosis present

## 2021-07-06 DIAGNOSIS — Z8249 Family history of ischemic heart disease and other diseases of the circulatory system: Secondary | ICD-10-CM

## 2021-07-06 DIAGNOSIS — R079 Chest pain, unspecified: Secondary | ICD-10-CM

## 2021-07-06 DIAGNOSIS — F17210 Nicotine dependence, cigarettes, uncomplicated: Secondary | ICD-10-CM | POA: Diagnosis present

## 2021-07-06 DIAGNOSIS — F419 Anxiety disorder, unspecified: Secondary | ICD-10-CM | POA: Diagnosis present

## 2021-07-06 DIAGNOSIS — Z800 Family history of malignant neoplasm of digestive organs: Secondary | ICD-10-CM

## 2021-07-06 DIAGNOSIS — Z79899 Other long term (current) drug therapy: Secondary | ICD-10-CM

## 2021-07-06 DIAGNOSIS — E291 Testicular hypofunction: Secondary | ICD-10-CM

## 2021-07-06 LAB — POCT GLYCOSYLATED HEMOGLOBIN (HGB A1C): Hemoglobin A1C: 8.8 % — AB (ref 4.0–5.6)

## 2021-07-06 MED ORDER — INSULIN LISPRO (1 UNIT DIAL) 100 UNIT/ML (KWIKPEN): 9.0000 [IU] | PEN_INJECTOR | Freq: Three times a day (TID) | SUBCUTANEOUS | 1 refills | Status: DC

## 2021-07-06 MED ORDER — ALPRAZOLAM 0.5 MG PO TABS: ORAL_TABLET | ORAL | 0 refills | Status: DC

## 2021-07-06 NOTE — Progress Notes (Signed)
? ?Subjective:  ? ? Patient ID: [name] [name], male    DOB: 01-18-1976, 46 y.o.   MRN: [id number] ? ?HPI ?Pt returns for f/u of diabetes mellitus:  ?DM type: Insulin-requiring type 2.  ?Dx'ed: 2021 ?Complications: CAD ?Therapy: insulin since 2022.   ?DKA: never ?Severe hypoglycemia: never ?Pancreatitis: 2021 ?Pancreatic imaging: (2021 CT) pseudocysts.   ?SDOH: he stopped testosterone rx, due to polycythemia.   ?Other: he takes multiple daily injections.   ?Interval history: no cbg record, but states cbg's vary from 120-400.  It is in general higher as the day goes on. He has not obtained continuous glucose monitor.  He will move to [place] soon.   ?Past Medical History:  ?Diagnosis Date  ? Anxiety   ? Coronary artery disease   ? a. 05/2014 OOH MI/PCI: LM nl, LAD 40p, 99p (3.0x28 Synergy DES) w/ R->L Collaterals, LCX nl, RCA 30p/d, EF 55% w/ sev distal inf/ant HK.  ? Depression   ? Diabetes mellitus without complication ([place])   ? Erectile dysfunction   ? H/O echocardiogram   ? a. 05/2014 Echo: Ef 45-50%, sev mid antsept/ant HK.  ? Hyperlipidemia   ? Hypertension   ? medication in 2012, stress as a big component  ? Neurosyphilis in male   ? hospitalization 04/19/2014.    ? Pancreatitis 10/2018  ? Tobacco abuse   ? ? ?Past Surgical History:  ?Procedure Laterality Date  ? CORONARY STENT PLACEMENT  05/24/2014  ? MID LAD DES   ? LEFT HEART CATHETERIZATION WITH CORONARY ANGIOGRAM N/A 05/24/2014  ? Procedure: LEFT HEART CATHETERIZATION WITH CORONARY ANGIOGRAM;  Surgeon: Jettie Booze, MD;  Location: [facility] CATH LAB;  Service: Cardiovascular;  Laterality: N/A;  ? PERCUTANEOUS CORONARY STENT INTERVENTION (PCI-S)  05/24/2014  ? Procedure: PERCUTANEOUS CORONARY STENT INTERVENTION (PCI-S);  Surgeon: Jettie Booze, MD;  Location: [facility] CATH LAB;  Service: Cardiovascular;;  ? PILONIDAL CYST EXCISION  1995  ? ? ?Social History  ? ?Socioeconomic History  ? Marital status: Single  ?  Spouse name: Not on file  ? Number of children: Not on  file  ? Years of education: Not on file  ? Highest education level: Not on file  ?Occupational History  ? Occupation: Secondary school teacher  ?Tobacco Use  ? Smoking status: Every Day  ?  Packs/day: 1.00  ?  Years: 23.00  ?  Pack years: 23.00  ?  Types: Cigarettes  ? Smokeless tobacco: Never  ? Tobacco comments:  ?  currently smoking 0.5 ppd, declines patch  ?Vaping Use  ? Vaping Use: Never used  ?Substance and Sexual Activity  ? Alcohol use: Yes  ?  Alcohol/week: 24.0 standard drinks  ?  Types: 24 Cans of beer per week  ?  Comment: 1/5-3/4 Fifth, 4-5 times a week.  +Guilty, cutting down; 2/4 CAGE questions.  ? Drug use: No  ? Sexual activity: Yes  ?  Partners: Male  ?Other Topics Concern  ? Not on file  ?Social History Narrative  ? Lives at home with roommate, exercise at gym in apartment complex, diet - no discretion.      ? ?Social Determinants of Health  ? ?Financial Resource Strain: Not on file  ?Food Insecurity: Not on file  ?Transportation Needs: Not on file  ?Physical Activity: Not on file  ?Stress: Not on file  ?Social Connections: Not on file  ?Intimate Partner Violence: Not on file  ? ? ?Current Outpatient Medications on File Prior to Visit  ?Medication Sig  Dispense Refill  ? ALPRAZolam (XANAX) 0.5 MG tablet TAKE ONE TABLET BY MOUTH DAILY AS NEEDED FOR ANXIETY 30 tablet 0  ? aspirin EC 81 MG EC tablet Take 1 tablet (81 mg total) by mouth daily.    ? atorvastatin (LIPITOR) 40 MG tablet TAKE ONE TABLET BY MOUTH DAILY AT 6PM 90 tablet 3  ? Blood Glucose Monitoring Suppl (ONETOUCH VERIO) w/Device KIT 1 kit by Does not apply route.    ? Continuous Blood Gluc Sensor (FREESTYLE LIBRE 2 SENSOR) MISC 1 Device by Does not apply route every 14 (fourteen) days. 6 each 3  ? emtricitabine-tenofovir (TRUVADA) 200-300 MG tablet Take 1 tablet by mouth daily.    ? glucose blood (ONETOUCH VERIO) test strip Use as instructed to check blood sugar once daily. 100 each 12  ? Insulin Glargine (BASAGLAR KWIKPEN) 100 UNIT/ML  Inject 20 Units into the skin daily. 30 mL 3  ? lisinopril (ZESTRIL) 20 MG tablet Take 1 tablet (20 mg total) by mouth daily. 90 tablet 3  ? metoprolol tartrate (LOPRESSOR) 50 MG tablet Take 1 tablet (50 mg total) by mouth 2 (two) times daily. 180 tablet 3  ? Multiple Vitamin (MULTIVITAMIN WITH MINERALS) TABS tablet Take 1 tablet by mouth daily.    ? OneTouch Delica Lancets 54S MISC 1 each by Does not apply route daily. Check blood sugar once daily 100 each 3  ? ?Current Facility-Administered Medications on File Prior to Visit  ?Medication Dose Route Frequency Provider Last Rate Last Admin  ? testosterone cypionate (DEPOTESTOSTERONE CYPIONATE) injection 200 mg  200 mg Intramuscular Q14 Days Denita Lung, MD   200 mg at 01/17/20 0941  ? ? ?Allergies  ?Allergen Reactions  ? Gadolinium Derivatives Nausea Only  ?  MRI dye  ? ? ?Family History  ?Problem Relation Age of Onset  ? Thyroid disease Mother   ? Heart disease Father 5  ?     MI, stents  ? Peripheral Artery Disease Father   ? Diabetes type II Father   ? Diabetes Father   ? Colon cancer Father   ? Diabetes Maternal Grandmother   ? Hypertension Maternal Grandfather   ? Hypertension Paternal Grandfather   ? Cancer Neg Hx   ? Stroke Neg Hx   ? Other Neg Hx   ?     hypogonadism  ? Pancreatic cancer Neg Hx   ? Esophageal cancer Neg Hx   ? ? ?BP (!) 146/90 (BP Location: Left Arm, Patient Position: Sitting, Cuff Size: Normal)   Pulse 68   Ht 5' 4" (1.626 m)   Wt 137 lb 12.8 oz (62.5 kg)   SpO2 98%   BMI 23.65 kg/m?  ? ? ?Review of Systems ?He denies hypoglycemia.  ?   ?Objective:  ? Physical Exam ?VITAL SIGNS:  See vs page ?GENERAL: no distress ? ? ?Lab Results  ?Component Value Date  ? HGBA1C 8.8 (A) 07/06/2021  ? ?   ?Assessment & Plan:  ?Insulin-requiring type 2 DM: uncontrolled.   ? ?Patient Instructions  ?Your blood pressure is high today.  Please see your heart doctor soon, to have it rechecked.   ?check your blood sugar once a day.  vary the time of day  when you check, between before the 3 meals, and at bedtime.  also check if you have symptoms of your blood sugar being too high or too low.  please keep a record of the readings and bring it to your next appointment here (or  you can bring the meter itself).  You can write it on any piece of paper.  please call us sooner if your blood sugar goes below 70, or if you have a lot of readings over 200.  ?I have sent 2 prescriptions to your pharmacy: to increase the Humalog, and:  ?Please continue the same Basaglar.   ?You should have an endocrinology follow-up appointment in 2 months.  ? ? ?

## 2021-07-06 NOTE — Patient Instructions (Addendum)
Your blood pressure is high today.  Please see your heart doctor soon, to have it rechecked.   ?check your blood sugar once a day.  vary the time of day when you check, between before the 3 meals, and at bedtime.  also check if you have symptoms of your blood sugar being too high or too low.  please keep a record of the readings and bring it to your next appointment here (or you can bring the meter itself).  You can write it on any piece of paper.  please call us sooner if your blood sugar goes below 70, or if you have a lot of readings over 200.  ?I have sent 2 prescriptions to your pharmacy: to increase the Humalog, and:  ?Please continue the same Basaglar.   ?You should have an endocrinology follow-up appointment in 2 months.  ?

## 2021-07-06 NOTE — Telephone Encounter (Signed)
Pt just wanted to make sure you were sending in his alprazolam rx for [place] ?

## 2021-07-06 NOTE — Progress Notes (Signed)
? ?  Subjective:  ? ? Patient ID: [name] [name], male    DOB: Feb 03, 1976, 46 y.o.   MRN: [id number] ? ?HPI ?He is here for an interval evaluation.  He was in [place] helping to take care of his father who subsequently died and apparently in 2023/01/10 of last year he was admitted and treated for sepsis.  He was discharged with a PICC line and subsequently given oral antibiotics.  He did follow-up with infectious disease and apparently was cleared.  He does plan to move back up to the [facility] area.  He is on PrEP and gets this taken care of by the health department.  He also sees Dr. Loanne Drilling for his underlying diabetes and is scheduled to see cardiology.  He has been seen by both recently.  His medications were reviewed.  He would like a refill on his Xanax. ? ? ?Review of Systems ? ?   ?Objective:  ? Physical Exam ?Alert and in no distress.  Cardiac exam shows regular rhythm without murmurs or gallops.  Lungs are clear to auscultation. ? ? ? ?   ?Assessment & Plan:  ?Coronary artery disease involving native coronary artery of native heart, unspecified whether angina present ? ?Family history of premature CAD ? ?Primary hypertension ? ?Hyperlipidemia, unspecified hyperlipidemia type ? ?Type 2 diabetes mellitus without complication, without long-term current use of insulin ([place]) ? ?Hypogonadism in male ? ?High risk homosexual behavior ? ?Situational anxiety - Plan: ALPRAZolam (XANAX) 0.5 MG tablet ?He will continue on his present medication regimen and follow-up with cardiology as well as endocrinology and go to the health department for his other medications.  I will give him a small amount of Xanax.  Since he is planning on moving to [facility] I will be available until he leaves.  Since he is already hooked in with infectious disease at their house will not pursue finding out exactly what happened with his sepsis.  I then discussed STI prevention with the possible use of doxycycline. ? ?

## 2021-07-07 ENCOUNTER — Emergency Department (HOSPITAL_COMMUNITY): Payer: Commercial Managed Care - HMO

## 2021-07-07 ENCOUNTER — Other Ambulatory Visit: Payer: Self-pay

## 2021-07-07 ENCOUNTER — Observation Stay (HOSPITAL_COMMUNITY): Payer: Commercial Managed Care - HMO

## 2021-07-07 ENCOUNTER — Encounter (HOSPITAL_COMMUNITY): Payer: Self-pay

## 2021-07-07 DIAGNOSIS — I2510 Atherosclerotic heart disease of native coronary artery without angina pectoris: Secondary | ICD-10-CM | POA: Diagnosis present

## 2021-07-07 DIAGNOSIS — F32A Depression, unspecified: Secondary | ICD-10-CM | POA: Diagnosis present

## 2021-07-07 DIAGNOSIS — I248 Other forms of acute ischemic heart disease: Secondary | ICD-10-CM | POA: Diagnosis present

## 2021-07-07 DIAGNOSIS — Z888 Allergy status to other drugs, medicaments and biological substances status: Secondary | ICD-10-CM | POA: Diagnosis not present

## 2021-07-07 DIAGNOSIS — R079 Chest pain, unspecified: Secondary | ICD-10-CM

## 2021-07-07 DIAGNOSIS — Z79899 Other long term (current) drug therapy: Secondary | ICD-10-CM | POA: Diagnosis not present

## 2021-07-07 DIAGNOSIS — Z7982 Long term (current) use of aspirin: Secondary | ICD-10-CM | POA: Diagnosis not present

## 2021-07-07 DIAGNOSIS — Z833 Family history of diabetes mellitus: Secondary | ICD-10-CM | POA: Diagnosis not present

## 2021-07-07 DIAGNOSIS — Z21 Asymptomatic human immunodeficiency virus [HIV] infection status: Secondary | ICD-10-CM | POA: Diagnosis present

## 2021-07-07 DIAGNOSIS — K859 Acute pancreatitis without necrosis or infection, unspecified: Secondary | ICD-10-CM | POA: Diagnosis present

## 2021-07-07 DIAGNOSIS — Z800 Family history of malignant neoplasm of digestive organs: Secondary | ICD-10-CM | POA: Diagnosis not present

## 2021-07-07 DIAGNOSIS — I160 Hypertensive urgency: Secondary | ICD-10-CM

## 2021-07-07 DIAGNOSIS — F419 Anxiety disorder, unspecified: Secondary | ICD-10-CM | POA: Diagnosis present

## 2021-07-07 DIAGNOSIS — Z794 Long term (current) use of insulin: Secondary | ICD-10-CM | POA: Diagnosis not present

## 2021-07-07 DIAGNOSIS — B20 Human immunodeficiency virus [HIV] disease: Secondary | ICD-10-CM

## 2021-07-07 DIAGNOSIS — Z8349 Family history of other endocrine, nutritional and metabolic diseases: Secondary | ICD-10-CM | POA: Diagnosis not present

## 2021-07-07 DIAGNOSIS — E119 Type 2 diabetes mellitus without complications: Secondary | ICD-10-CM | POA: Diagnosis present

## 2021-07-07 DIAGNOSIS — N529 Male erectile dysfunction, unspecified: Secondary | ICD-10-CM | POA: Diagnosis present

## 2021-07-07 DIAGNOSIS — Z955 Presence of coronary angioplasty implant and graft: Secondary | ICD-10-CM | POA: Diagnosis not present

## 2021-07-07 DIAGNOSIS — E785 Hyperlipidemia, unspecified: Secondary | ICD-10-CM | POA: Diagnosis present

## 2021-07-07 DIAGNOSIS — F17210 Nicotine dependence, cigarettes, uncomplicated: Secondary | ICD-10-CM | POA: Diagnosis present

## 2021-07-07 DIAGNOSIS — I10 Essential (primary) hypertension: Secondary | ICD-10-CM | POA: Diagnosis present

## 2021-07-07 DIAGNOSIS — Z8249 Family history of ischemic heart disease and other diseases of the circulatory system: Secondary | ICD-10-CM | POA: Diagnosis not present

## 2021-07-07 LAB — BASIC METABOLIC PANEL
Anion gap: 9 (ref 5–15)
BUN: 7 mg/dL (ref 6–20)
CO2: 26 mmol/L (ref 22–32)
Calcium: 9.3 mg/dL (ref 8.9–10.3)
Chloride: 98 mmol/L (ref 98–111)
Creatinine, Ser: 0.56 mg/dL — ABNORMAL LOW (ref 0.61–1.24)
GFR, Estimated: >60 mL/min (ref >60)
Glucose, Bld: 100 mg/dL — ABNORMAL HIGH (ref 70–99)
Potassium: 3.7 mmol/L (ref 3.5–5.1)
Sodium: 133 mmol/L — ABNORMAL LOW (ref 135–145)

## 2021-07-07 LAB — CBC WITH DIFFERENTIAL/PLATELET
Abs Immature Granulocytes: 0.04 10*3/uL (ref 0.00–0.07)
Basophils Absolute: 0 10*3/uL (ref 0.0–0.1)
Basophils Relative: 0 %
Eosinophils Absolute: 0.1 10*3/uL (ref 0.0–0.5)
Eosinophils Relative: 1 %
HCT: 46.3 % (ref 39.0–52.0)
Hemoglobin: 16.7 g/dL (ref 13.0–17.0)
Immature Granulocytes: 0 %
Lymphocytes Relative: 21 %
Lymphs Abs: 2.1 10*3/uL (ref 0.7–4.0)
MCH: 32.6 pg (ref 26.0–34.0)
MCHC: 36.1 g/dL — ABNORMAL HIGH (ref 30.0–36.0)
MCV: 90.3 fL (ref 80.0–100.0)
Monocytes Absolute: 0.6 10*3/uL (ref 0.1–1.0)
Monocytes Relative: 6 %
Neutro Abs: 7.2 10*3/uL (ref 1.7–7.7)
Neutrophils Relative %: 72 %
Platelets: 149 10*3/uL — ABNORMAL LOW (ref 150–400)
RBC: 5.13 MIL/uL (ref 4.22–5.81)
RDW: 15.9 % — ABNORMAL HIGH (ref 11.5–15.5)
WBC: 10.1 10*3/uL (ref 4.0–10.5)
nRBC: 0 % (ref 0.0–0.2)

## 2021-07-07 LAB — COMPREHENSIVE METABOLIC PANEL
ALT: 17 U/L (ref 0–44)
AST: 24 U/L (ref 15–41)
Albumin: 3.8 g/dL (ref 3.5–5.0)
Alkaline Phosphatase: 81 U/L (ref 38–126)
Anion gap: 9 (ref 5–15)
BUN: 5 mg/dL — ABNORMAL LOW (ref 6–20)
CO2: 25 mmol/L (ref 22–32)
Calcium: 9.1 mg/dL (ref 8.9–10.3)
Chloride: 102 mmol/L (ref 98–111)
Creatinine, Ser: 0.55 mg/dL — ABNORMAL LOW (ref 0.61–1.24)
GFR, Estimated: >60 mL/min (ref >60)
Glucose, Bld: 146 mg/dL — ABNORMAL HIGH (ref 70–99)
Potassium: 3.6 mmol/L (ref 3.5–5.1)
Sodium: 136 mmol/L (ref 135–145)
Total Bilirubin: 1 mg/dL (ref 0.3–1.2)
Total Protein: 6.9 g/dL (ref 6.5–8.1)

## 2021-07-07 LAB — LIPID PANEL
Cholesterol: 149 mg/dL (ref 0–200)
HDL: 51 mg/dL (ref >40)
LDL Cholesterol: 81 mg/dL (ref 0–99)
Total CHOL/HDL Ratio: 2.9 RATIO
Triglycerides: 87 mg/dL (ref <150)
VLDL: 17 mg/dL (ref 0–40)

## 2021-07-07 LAB — HEPATIC FUNCTION PANEL
ALT: 18 U/L (ref 0–44)
AST: 28 U/L (ref 15–41)
Albumin: 4.1 g/dL (ref 3.5–5.0)
Alkaline Phosphatase: 85 U/L (ref 38–126)
Bilirubin, Direct: 0.1 mg/dL (ref 0.0–0.2)
Indirect Bilirubin: 0.5 mg/dL (ref 0.3–0.9)
Total Bilirubin: 0.6 mg/dL (ref 0.3–1.2)
Total Protein: 7.5 g/dL (ref 6.5–8.1)

## 2021-07-07 LAB — CBC
HCT: 45.5 % (ref 39.0–52.0)
Hemoglobin: 16.2 g/dL (ref 13.0–17.0)
MCH: 32.3 pg (ref 26.0–34.0)
MCHC: 35.6 g/dL (ref 30.0–36.0)
MCV: 90.8 fL (ref 80.0–100.0)
Platelets: 171 10*3/uL (ref 150–400)
RBC: 5.01 MIL/uL (ref 4.22–5.81)
RDW: 16.1 % — ABNORMAL HIGH (ref 11.5–15.5)
WBC: 9.5 10*3/uL (ref 4.0–10.5)
nRBC: 0 % (ref 0.0–0.2)

## 2021-07-07 LAB — HIV ANTIBODY (ROUTINE TESTING W REFLEX): HIV Screen 4th Generation wRfx: NONREACTIVE

## 2021-07-07 LAB — ECHOCARDIOGRAM COMPLETE
Area-P 1/2: 4.49 cm2
Height: 64 in
S' Lateral: 2.5 cm
Weight: 2204.8 oz

## 2021-07-07 LAB — TROPONIN I (HIGH SENSITIVITY)
Troponin I (High Sensitivity): 63 ng/L — ABNORMAL HIGH (ref <18)
Troponin I (High Sensitivity): 72 ng/L — ABNORMAL HIGH (ref <18)

## 2021-07-07 LAB — ETHANOL: Alcohol, Ethyl (B): <10 mg/dL (ref <10)

## 2021-07-07 LAB — CBG MONITORING, ED: Glucose-Capillary: 160 mg/dL — ABNORMAL HIGH (ref 70–99)

## 2021-07-07 LAB — GLUCOSE, CAPILLARY: Glucose-Capillary: 216 mg/dL — ABNORMAL HIGH (ref 70–99)

## 2021-07-07 LAB — LIPASE, BLOOD: Lipase: 68 U/L — ABNORMAL HIGH (ref 11–51)

## 2021-07-07 MED ORDER — ENOXAPARIN SODIUM 40 MG/0.4ML IJ SOSY
40.0000 mg | PREFILLED_SYRINGE | INTRAMUSCULAR | Status: DC
  Administered 2021-07-07 – 2021-07-09 (×3): 40 mg via SUBCUTANEOUS
  Filled 2021-07-07 (×3): qty 0.4

## 2021-07-07 MED ORDER — PROCHLORPERAZINE EDISYLATE 10 MG/2ML IJ SOLN: 10.0000 mg | Freq: Four times a day (QID) | INTRAMUSCULAR | Status: DC | PRN

## 2021-07-07 MED ORDER — OXYCODONE-ACETAMINOPHEN 7.5-325 MG PO TABS
1.0000 | ORAL_TABLET | Freq: Four times a day (QID) | ORAL | Status: DC | PRN
  Administered 2021-07-07: 1 via ORAL
  Filled 2021-07-07: qty 1

## 2021-07-07 MED ORDER — LIDOCAINE VISCOUS HCL 2 % MT SOLN
15.0000 mL | Freq: Once | OROMUCOSAL | Status: AC
  Administered 2021-07-07: 15 mL via ORAL
  Filled 2021-07-07: qty 15

## 2021-07-07 MED ORDER — METOPROLOL TARTRATE 25 MG PO TABS
25.0000 mg | ORAL_TABLET | Freq: Two times a day (BID) | ORAL | Status: DC
  Administered 2021-07-07 – 2021-07-08 (×3): 25 mg via ORAL
  Filled 2021-07-07 (×3): qty 1

## 2021-07-07 MED ORDER — ONDANSETRON HCL 4 MG/2ML IJ SOLN
4.0000 mg | Freq: Four times a day (QID) | INTRAMUSCULAR | Status: DC | PRN
Start: 2021-07-07 — End: 2021-07-09

## 2021-07-07 MED ORDER — SODIUM CHLORIDE (PF) 0.9 % IJ SOLN
INTRAMUSCULAR | Status: AC
  Filled 2021-07-07: qty 50

## 2021-07-07 MED ORDER — LISINOPRIL 20 MG PO TABS
20.0000 mg | ORAL_TABLET | Freq: Every day | ORAL | Status: DC
  Administered 2021-07-07 – 2021-07-08 (×2): 20 mg via ORAL
  Filled 2021-07-07: qty 2
  Filled 2021-07-07: qty 1

## 2021-07-07 MED ORDER — SODIUM CHLORIDE 0.9 % IV SOLN: INTRAVENOUS | Status: DC

## 2021-07-07 MED ORDER — INSULIN ASPART 100 UNIT/ML IJ SOLN
0.0000 [IU] | Freq: Three times a day (TID) | INTRAMUSCULAR | Status: DC
  Administered 2021-07-07: 3 [IU] via SUBCUTANEOUS
  Administered 2021-07-07: 5 [IU] via SUBCUTANEOUS
  Administered 2021-07-08: 3 [IU] via SUBCUTANEOUS
  Administered 2021-07-08: 11 [IU] via SUBCUTANEOUS
  Administered 2021-07-08 – 2021-07-09 (×2): 5 [IU] via SUBCUTANEOUS
  Administered 2021-07-09: 2 [IU] via SUBCUTANEOUS
  Filled 2021-07-07: qty 0.15

## 2021-07-07 MED ORDER — ACETAMINOPHEN 325 MG PO TABS
650.0000 mg | ORAL_TABLET | ORAL | Status: DC | PRN
  Administered 2021-07-08: 650 mg via ORAL
  Filled 2021-07-07: qty 2

## 2021-07-07 MED ORDER — FENTANYL CITRATE PF 50 MCG/ML IJ SOSY
100.0000 ug | PREFILLED_SYRINGE | Freq: Once | INTRAMUSCULAR | Status: AC
  Administered 2021-07-07: 100 ug via INTRAVENOUS
  Filled 2021-07-07: qty 2

## 2021-07-07 MED ORDER — HYDRALAZINE HCL 20 MG/ML IJ SOLN
10.0000 mg | Freq: Three times a day (TID) | INTRAMUSCULAR | Status: DC | PRN
  Administered 2021-07-07 – 2021-07-08 (×3): 10 mg via INTRAVENOUS
  Filled 2021-07-07 (×3): qty 1

## 2021-07-07 MED ORDER — ONDANSETRON HCL 4 MG/2ML IJ SOLN
4.0000 mg | Freq: Once | INTRAMUSCULAR | Status: AC
  Administered 2021-07-07: 4 mg via INTRAVENOUS
  Filled 2021-07-07: qty 2

## 2021-07-07 MED ORDER — ATORVASTATIN CALCIUM 40 MG PO TABS
40.0000 mg | ORAL_TABLET | Freq: Every day | ORAL | Status: DC
  Administered 2021-07-07 – 2021-07-08 (×2): 40 mg via ORAL
  Filled 2021-07-07 (×2): qty 1

## 2021-07-07 MED ORDER — IOHEXOL 300 MG/ML  SOLN
100.0000 mL | Freq: Once | INTRAMUSCULAR | Status: AC | PRN
Start: 2021-07-07 — End: 2021-07-07
  Administered 2021-07-07: 100 mL via INTRAVENOUS

## 2021-07-07 MED ORDER — ASPIRIN EC 81 MG PO TBEC
81.0000 mg | DELAYED_RELEASE_TABLET | Freq: Every day | ORAL | Status: DC
  Administered 2021-07-07 – 2021-07-09 (×3): 81 mg via ORAL
  Filled 2021-07-07 (×3): qty 1

## 2021-07-07 MED ORDER — FENTANYL CITRATE PF 50 MCG/ML IJ SOSY
50.0000 ug | PREFILLED_SYRINGE | Freq: Once | INTRAMUSCULAR | Status: AC
  Administered 2021-07-07: 50 ug via INTRAVENOUS
  Filled 2021-07-07: qty 1

## 2021-07-07 MED ORDER — EMTRICITABINE-TENOFOVIR AF 200-25 MG PO TABS
1.0000 | ORAL_TABLET | Freq: Every day | ORAL | Status: DC
  Administered 2021-07-07 – 2021-07-09 (×3): 1 via ORAL
  Filled 2021-07-07 (×3): qty 1

## 2021-07-07 MED ORDER — NITROGLYCERIN 2 % TD OINT
1.0000 [in_us] | TOPICAL_OINTMENT | Freq: Once | TRANSDERMAL | Status: AC
  Administered 2021-07-07: 1 [in_us] via TOPICAL
  Filled 2021-07-07: qty 1

## 2021-07-07 MED ORDER — ALPRAZOLAM 0.5 MG PO TABS
0.5000 mg | ORAL_TABLET | Freq: Every day | ORAL | Status: DC | PRN
  Administered 2021-07-08 – 2021-07-09 (×2): 0.5 mg via ORAL
  Filled 2021-07-07 (×2): qty 1

## 2021-07-07 MED ORDER — ALUM & MAG HYDROXIDE-SIMETH 200-200-20 MG/5ML PO SUSP
30.0000 mL | Freq: Once | ORAL | Status: AC
  Administered 2021-07-07: 30 mL via ORAL
  Filled 2021-07-07: qty 30

## 2021-07-07 NOTE — Progress Notes (Signed)
Pt reported that the Health Dept just called pt to report that he is + gonorrhea.  Notified J. Olena Heckle, APP. ?Chancy Hurter B ? ?

## 2021-07-07 NOTE — H&P (Signed)
?History and Physical  ? ? ?Patient: [name] [name] MRN:[id number] DOB: 07/09/1975 ?DOA: 07/06/2021 ?DOS: the patient was seen and examined on 07/07/2021 ?PCP: Lalonde, John C, MD  ?Patient coming from: Home ? ?Chief Complaint:  ?Chief Complaint  ?Patient presents with  ? Chest Pain  ? ?HPI: [name] [name] is a 46 y.o. male with medical history significant of CAD, HLD, HTN, anxiety, HIV, DM2. Presenting with chest pain and abdominal pain. Started 3 days ago. Started in the low sternum/epigastric area radiating to chest and back. He had N/V, but no fever. He tried pepto and laxatives, but they didn't help. When his symptoms didn't improve last night, he decided to come to the ED for help. He denies any other aggravating or alleviating factors. Of note, he thinks his symptoms started with taking an antibiotic d/t exposure to an undisclosed STD. He has since stopped that antibiotic.   ? ?Review of Systems: As mentioned in the history of present illness. All other systems reviewed and are negative. ?Past Medical History:  ?Diagnosis Date  ? Anxiety   ? Coronary artery disease   ? a. 05/2014 OOH MI/PCI: LM nl, LAD 40p, 99p (3.0x28 Synergy DES) w/ R->L Collaterals, LCX nl, RCA 30p/d, EF 55% w/ sev distal inf/ant HK.  ? Depression   ? Diabetes mellitus without complication (HCC)   ? Erectile dysfunction   ? H/O echocardiogram   ? a. 05/2014 Echo: Ef 45-50%, sev mid antsept/ant HK.  ? Hyperlipidemia   ? Hypertension   ? medication in 2012, stress as a big component  ? Neurosyphilis in male   ? hospitalization 04/19/2014.    ? Pancreatitis 10/2018  ? Tobacco abuse   ? ?Past Surgical History:  ?Procedure Laterality Date  ? CORONARY STENT PLACEMENT  05/24/2014  ? MID LAD DES   ? LEFT HEART CATHETERIZATION WITH CORONARY ANGIOGRAM N/A 05/24/2014  ? Procedure: LEFT HEART CATHETERIZATION WITH CORONARY ANGIOGRAM;  Surgeon: Jayadeep S Varanasi, MD;  Location: [facility];  Service: Cardiovascular;  Laterality: N/A;  ? PERCUTANEOUS CORONARY  STENT INTERVENTION (PCI-S)  05/24/2014  ? Procedure: PERCUTANEOUS CORONARY STENT INTERVENTION (PCI-S);  Surgeon: Jayadeep S Varanasi, MD;  Location: [facility]ATH [facility];  Service: Cardiovascular;;  ? PILONIDAL CYST EXCISION  1995  ? ?Social History:  reports that he has been smoking cigarettes. He has a 23.00 pack-year smoking history. He has never used smokeless tobacco. He reports current alcohol use of about 24.0 standard drinks per week. He reports that he does not use drugs. ? ?Allergies  ?Allergen Reactions  ? Gadolinium Derivatives Nausea Only  ?  MRI dye  ? ? ?Family History  ?Problem Relation Age of Onset  ? Thyroid disease Mother   ? Heart disease Father 50  ?     MI, stents  ? Peripheral Artery Disease Father   ? Diabetes type II Father   ? Diabetes Father   ? Colon cancer Father   ? Diabetes Maternal Grandmother   ? Hypertension Maternal Grandfather   ? Hypertension Paternal Grandfather   ? Cancer Neg Hx   ? Stroke Neg Hx   ? Other Neg Hx   ?     hypogonadism  ? Pancreatic cancer Neg Hx   ? Esophageal cancer Neg Hx   ? ? ?Prior to Admission medications   ?Medication Sig Start Date End Date Taking? Authorizing Provider  ?ALPRAZolam (XANAX) 0.5 MG tablet TAKE ONE TABLET BY MOUTH DAILY AS NEEDED FOR ANXIETY ?Patient taking differently: Take 0.5 mg   by mouth daily as needed for anxiety. 07/06/21  Yes Lalonde, John C, MD  ?aspirin EC 81 MG EC tablet Take 1 tablet (81 mg total) by mouth daily. 05/25/14  Yes Berge, Christopher Ronald, NP  ?doxycycline (VIBRA-TABS) 100 MG tablet Take 100 mg by mouth 2 (two) times daily.   Yes [provider]  ?emtricitabine-tenofovir (TRUVADA) 200-300 MG tablet Take 1 tablet by mouth daily. 11/13/19  Yes [provider]  ?Insulin Glargine (BASAGLAR KWIKPEN) 100 UNIT/ML Inject 20 Units into the skin daily. 05/29/21  Yes Ellison, Sean, MD  ?insulin lispro (HUMALOG KWIKPEN) 100 UNIT/ML KwikPen Inject 9-12 Units into the skin 3 (three) times daily with meals. And pen needles 4/day  07/06/21  Yes Ellison, Sean, MD  ?lisinopril (ZESTRIL) 20 MG tablet Take 1 tablet (20 mg total) by mouth daily. 06/03/21  Yes Acharya, Gayatri A, MD  ?metoprolol tartrate (LOPRESSOR) 50 MG tablet Take 1 tablet (50 mg total) by mouth 2 (two) times daily. 06/03/21  Yes Acharya, Gayatri A, MD  ?atorvastatin (LIPITOR) 40 MG tablet TAKE ONE TABLET BY MOUTH DAILY AT 6PM ?Patient not taking: Reported on 07/07/2021 06/03/21   Acharya, Gayatri A, MD  ?Blood Glucose Monitoring Suppl (ONETOUCH VERIO) w/Device KIT 1 kit by Does not apply route.    [provider]  ?Continuous Blood Gluc Sensor (FREESTYLE LIBRE 2 SENSOR) MISC 1 Device by Does not apply route every 14 (fourteen) days. 05/26/21   Ellison, Sean, MD  ?glucose blood (ONETOUCH VERIO) test strip Use as instructed to check blood sugar once daily. 07/10/20   Lalonde, John C, MD  ?Multiple Vitamin (MULTIVITAMIN WITH MINERALS) TABS tablet Take 1 tablet by mouth daily. ?Patient not taking: Reported on 07/07/2021    [provider]  ?OneTouch Delica Lancets 33G MISC 1 each by Does not apply route daily. Check blood sugar once daily 07/10/20   Lalonde, John C, MD  ? ? ?Physical Exam: ?Vitals:  ? 07/07/21 0001 07/07/21 0115 07/07/21 0230 07/07/21 0330  ?BP:  (!) 212/133 (!) 195/110 (!) 195/104  ?Pulse:  65 (!) 57 (!) 51  ?Resp: 18 17 15 14  ?Temp: (!) 97.4 ?F (36.3 ?C)     ?TempSrc: Oral     ?SpO2:  100% 98% 100%  ? ?General: 46 y.o. male resting in bed in NAD ?Eyes: PERRL, normal sclera ?ENMT: Nares patent w/o discharge, orophaynx clear, dentition normal, ears w/o discharge/lesions/ulcers ?Neck: Supple, trachea midline ?Cardiovascular: RRR, +S1, S2, no m/g/r, equal pulses throughout ?Respiratory: CTABL, no w/r/r, normal WOB ?GI: BS+, ND, mild epigastric ttp, no masses noted, no organomegaly noted ?MSK: No e/c/c ?Neuro: A&O x 3, no focal deficits ?Psyc: Appropriate interaction and affect, anxious but cooperative ? ?Data Reviewed: ? ?Na+ 133 ?Scr  0.56 ?Lipase  68 ?Trp 72  -> 63 ?WBC 9.5 ? ?CT ab/pelvis: ?Cavernous transformation of the portal vein. ?Resolution of previously seen pancreatic pseudocysts. Some inflammatory changes noted in the region of the head of the pancreas consistent with mild focal pancreatitis. ?Mild biliary ductal dilatation without definitive obstructive lesion. ?No other focal abnormality is noted. ? ?EKG: sinus, minimal st elevation in V2 ? ?Assessment and Plan: ?No notes have been filed under this hospital service. ?Service: Hospitalist ?Chest pain ?Hx of CAD ?    - placed in obs, progressive ?    - trp 72 - 63 ?    - EKG as above ?    - EDP spoke with cards, they felt his pain w/ d/t HTN ?    -   echo ordered ?    - lipid panel ordered ?    - continue ASA, betablocker; start statin ? ?HTN urgency ?    - nitro paste applied, BP somewhat better ?    - resume home regimen ?    - add PRN hydralazine ? ?Pancreatitis ?    - as per CT above; fluids, pain control, anti-emetics, can have CLD for now ?    - biliary numbers all look fine; if not improving by AM, can consider a RUQ [place] to assess ? ?DM2 ?    - SSI, glucose checks, CLD ? ?HIV ?    - resume home regimen ? ?HLD ?    - statin ? ?Anxiety ?    - resume home regimen ? ?Tobacco abuse ?    - counsel against further use ? ?Advance Care Planning:   Code Status: Prior ? ?Consults: None ? ?Family Communication: None at bedside ? ?Severity of Illness: ?The appropriate patient status for this patient is OBSERVATION. Observation status is judged to be reasonable and necessary in order to provide the required intensity of service to ensure the patient's safety. The patient's presenting symptoms, physical exam findings, and initial radiographic and laboratory data in the context of their medical condition is felt to place them at decreased risk for further clinical deterioration. Furthermore, it is anticipated that the patient will be medically stable for discharge from the hospital within 2 midnights of admission.   ? ?Author: ?Jonnie Finner, DO ?07/07/2021 7:09 AM ? ?For on call review [email or web address].  ?

## 2021-07-07 NOTE — ED Provider Notes (Signed)
?[facility] DEPT ?Provider Note ? ? ?CSN: [id number] ?Arrival date & time: 07/06/21  2353 ? ?  ? ?History ? ?Chief Complaint  ?Patient presents with  ? Chest Pain  ? ? ?[name] [name] is a 46 y.o. male. ? ?The history is provided by the patient.  ?Patient with history of CAD presents with abdominal pain that started over the past day that radiates into his back and chest.  He also reports associated fevers and vomiting.  He reports mild shortness of breath.  No bloody emesis.  He has had normal bowel movements without any blood or melena. ?He reports his last drink of alcohol was several days ago.  He does report a previous history of pancreatitis.  Denies any drug abuse.  Reports he has felt unwell recently due to taking antibiotics for STDs ?  ? ?Home Medications ?Prior to Admission medications   ?Medication Sig Start Date End Date Taking? Authorizing Provider  ?ALPRAZolam (XANAX) 0.5 MG tablet TAKE ONE TABLET BY MOUTH DAILY AS NEEDED FOR ANXIETY 07/06/21   Denita Lung, MD  ?aspirin EC 81 MG EC tablet Take 1 tablet (81 mg total) by mouth daily. 05/25/14   Theora Gianotti, NP  ?atorvastatin (LIPITOR) 40 MG tablet TAKE ONE TABLET BY MOUTH DAILY AT Amedeo Plenty 06/03/21   Elouise Munroe, MD  ?Blood Glucose Monitoring Suppl (ONETOUCH VERIO) w/Device KIT 1 kit by Does not apply route.    [provider]  ?Continuous Blood Gluc Sensor (FREESTYLE LIBRE 2 SENSOR) MISC 1 Device by Does not apply route every 14 (fourteen) days. 05/26/21   Renato Shin, MD  ?emtricitabine-tenofovir (TRUVADA) 200-300 MG tablet Take 1 tablet by mouth daily. 11/13/19   [provider]  ?glucose blood (ONETOUCH VERIO) test strip Use as instructed to check blood sugar once daily. 07/10/20   Denita Lung, MD  ?Insulin Glargine [facility] KWIKPEN) 100 UNIT/ML Inject 20 Units into the skin daily. 05/29/21   Renato Shin, MD  ?insulin lispro (HUMALOG KWIKPEN) 100 UNIT/ML KwikPen Inject 9-12 Units into  the skin 3 (three) times daily with meals. And pen needles 4/day 07/06/21   Renato Shin, MD  ?lisinopril (ZESTRIL) 20 MG tablet Take 1 tablet (20 mg total) by mouth daily. 06/03/21   Elouise Munroe, MD  ?metoprolol tartrate (LOPRESSOR) 50 MG tablet Take 1 tablet (50 mg total) by mouth 2 (two) times daily. 06/03/21   Elouise Munroe, MD  ?Multiple Vitamin (MULTIVITAMIN WITH MINERALS) TABS tablet Take 1 tablet by mouth daily.    [provider]  ?Jonetta Speak Lancets 50Y MISC 1 each by Does not apply route daily. Check blood sugar once daily 07/10/20   Denita Lung, MD  ?   ? ?Allergies    ?Gadolinium derivatives   ? ?Review of Systems   ?Review of Systems  ?Constitutional:  Positive for fever.  ?Cardiovascular:  Positive for chest pain.  ?Gastrointestinal:  Positive for abdominal pain and vomiting. Negative for blood in stool.  ?Neurological:  Negative for weakness and numbness.  ? ?Physical Exam ?Updated Vital Signs ?BP (!) 213/117   Pulse 60   Temp (!) 97.4 ?F (36.3 ?C) (Oral)   Resp 18   SpO2 99%  ?Physical Exam ?CONSTITUTIONAL: Disheveled and anxious ?HEAD: Normocephalic/atraumatic ?EYES: EOMI/PERRL ?ENMT: Mucous membranes moist ?NECK: supple no meningeal signs ?SPINE/BACK:entire spine nontender ?CV: S1/S2 noted, no murmurs/rubs/gallops noted ?LUNGS: Lungs are clear to auscultation bilaterally, no apparent distress ?ABDOMEN: soft, moderate epigastric tenderness, no rebound  or guarding, bowel sounds noted throughout abdomen ?GU:no cva tenderness ?NEURO: Pt is awake/alert/appropriate, moves all extremitiesx4.  No facial droop.  No arm or leg drift ?EXTREMITIES: pulses normal/equalx4, full ROM ?SKIN: warm, color normal ?PSYCH: Anxious ? ?ED Results / Procedures / Treatments   ?Labs ?(all labs ordered are listed, but only abnormal results are displayed) ?Labs Reviewed  ?BASIC METABOLIC PANEL - Abnormal; Notable for the following components:  ?    Result Value  ? Sodium 133 (*)   ? Glucose, Bld  100 (*)   ? Creatinine, Ser 0.56 (*)   ? All other components within normal limits  ?CBC - Abnormal; Notable for the following components:  ? RDW 16.1 (*)   ? All other components within normal limits  ?LIPASE, BLOOD - Abnormal; Notable for the following components:  ? Lipase 68 (*)   ? All other components within normal limits  ?TROPONIN I (HIGH SENSITIVITY) - Abnormal; Notable for the following components:  ? Troponin I (High Sensitivity) 72 (*)   ? All other components within normal limits  ?TROPONIN I (HIGH SENSITIVITY) - Abnormal; Notable for the following components:  ? Troponin I (High Sensitivity) 63 (*)   ? All other components within normal limits  ?HEPATIC FUNCTION PANEL  ?ETHANOL  ? ? ?EKG ?EKG Interpretation ? ?Date/Time:  Monday Jul 06 2021 23:58:41 EDT ?Ventricular Rate:  58 ?PR Interval:  139 ?QRS Duration: 93 ?QT Interval:  438 ?QTC Calculation: 431 ?R Axis:   -16 ?Text Interpretation: Sinus rhythm Borderline left axis deviation Confirmed by Ripley Fraise [phone]) on 07/07/2021 12:04:56 AM ? ? ? EKG Interpretation ? ?Date/Time:  Tuesday Jul 07 2021 01:52:37 EDT ?Ventricular Rate:  56 ?PR Interval:  150 ?QRS Duration: 90 ?QT Interval:  455 ?QTC Calculation: 440 ?R Axis:   -9 ?Text Interpretation: Sinus rhythm Probable left atrial enlargement Nonspecific T abnormalities, inferior leads Confirmed by Ripley Fraise [phone]) on 07/07/2021 1:57:07 AM ?  ? ?  ? ? ?Radiology ?DG Chest 2 View ? ?Result Date: 07/07/2021 ?CLINICAL DATA:  Chest pain. EXAM: CHEST - 2 VIEW COMPARISON:  Chest x-ray 10/18/2018. FINDINGS: There is blunting of the right costophrenic angle, new from prior. This is favored as scarring or small amount of loculated fluid. There is no lung consolidation or pneumothorax. The cardiomediastinal silhouette is within normal limits. Osseous structures are within normal limits. IMPRESSION: 1. Blunting of the right costophrenic angle may represent scarring or small amount of loculated pleural fluid.  No focal lung infiltrate. Electronically Signed   By: Ronney Asters M.D.   On: 07/07/2021 00:16  ? ?CT ABDOMEN PELVIS W CONTRAST ? ?Result Date: 07/07/2021 ?CLINICAL DATA:  Abdominal pain and indigestion for 2 days, initial encounter EXAM: CT ABDOMEN AND PELVIS WITH CONTRAST TECHNIQUE: Multidetector CT imaging of the abdomen and pelvis was performed using the standard protocol following bolus administration of intravenous contrast. RADIATION DOSE REDUCTION: This exam was performed according to the departmental dose-optimization program which includes automated exposure control, adjustment of the mA and/or kV according to patient size and/or use of iterative reconstruction technique. CONTRAST:  137m OMNIPAQUE IOHEXOL 300 MG/ML  SOLN COMPARISON:  12/09/2019 FINDINGS: Lower chest: No acute abnormality. Hepatobiliary: Liver shows mild intrahepatic ductal dilatation. Gallbladder is well distended. No definitive common bile duct stone is seen. Pancreas: Mild peripancreatic inflammatory changes noted in the region of the pancreatic head. This may represent some very mild focal pancreatitis. Pancreas is atrophic. Previously seen pseudocysts have resolved in the interval. Spleen: Normal  in size without focal abnormality. Adrenals/Urinary Tract: Adrenal glands are within normal limits. Kidneys demonstrate a normal enhancement pattern bilaterally. Delayed image shows normal excretion bilaterally. The bladder is within normal limits. Stomach/Bowel: The appendix is within normal limits. No obstructive or inflammatory changes of colon are seen. Stomach is unremarkable. No obstructive changes are noted in the small bowel. Vascular/Lymphatic: Vascular calcifications are seen. No significant lymphadenopathy is noted. 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 Reproductive: Prostate is unremarkable. Other: No abdominal wall hernia or abnormality. No abdominopelvic ascites. Musculoskeletal: No  acute or significant osseous findings. 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 Some inflammatory changes noted in the region of the he

## 2021-07-07 NOTE — Progress Notes (Signed)
Echocardiogram ?2D Echocardiogram has been performed. ? ?Oneal Deputy Angelino Rumery RDCS ?07/07/2021, 11:53 AM ?

## 2021-07-07 NOTE — ED Triage Notes (Signed)
Pt states that he has been having indigestion since yesterday and it is now becoming worse radiating into his upper back and chest.  ?

## 2021-07-08 ENCOUNTER — Inpatient Hospital Stay (HOSPITAL_COMMUNITY): Payer: Commercial Managed Care - HMO

## 2021-07-08 DIAGNOSIS — I160 Hypertensive urgency: Secondary | ICD-10-CM | POA: Diagnosis not present

## 2021-07-08 LAB — GLUCOSE, CAPILLARY
Glucose-Capillary: 168 mg/dL — ABNORMAL HIGH (ref 70–99)
Glucose-Capillary: 220 mg/dL — ABNORMAL HIGH (ref 70–99)
Glucose-Capillary: 327 mg/dL — ABNORMAL HIGH (ref 70–99)
Glucose-Capillary: 119 mg/dL — ABNORMAL HIGH (ref 70–99)

## 2021-07-08 MED ORDER — LISINOPRIL 20 MG PO TABS
20.0000 mg | ORAL_TABLET | Freq: Once | ORAL | Status: AC
  Administered 2021-07-08: 20 mg via ORAL
  Filled 2021-07-08: qty 1

## 2021-07-08 MED ORDER — DOXYCYCLINE HYCLATE 100 MG PO TABS
100.0000 mg | ORAL_TABLET | Freq: Two times a day (BID) | ORAL | Status: DC
  Administered 2021-07-08 – 2021-07-09 (×3): 100 mg via ORAL
  Filled 2021-07-08 (×3): qty 1

## 2021-07-08 MED ORDER — CARVEDILOL 12.5 MG PO TABS
12.5000 mg | ORAL_TABLET | Freq: Two times a day (BID) | ORAL | Status: DC
Start: 2021-07-08 — End: 2021-07-09
  Administered 2021-07-08 – 2021-07-09 (×2): 12.5 mg via ORAL
  Filled 2021-07-08 (×2): qty 1

## 2021-07-08 MED ORDER — AMLODIPINE BESYLATE 5 MG PO TABS
5.0000 mg | ORAL_TABLET | Freq: Every day | ORAL | Status: DC
  Administered 2021-07-08 – 2021-07-09 (×2): 5 mg via ORAL
  Filled 2021-07-08 (×2): qty 1

## 2021-07-08 MED ORDER — LISINOPRIL 20 MG PO TABS
40.0000 mg | ORAL_TABLET | Freq: Every day | ORAL | Status: DC
  Administered 2021-07-09: 40 mg via ORAL
  Filled 2021-07-08: qty 2

## 2021-07-08 MED ORDER — INSULIN GLARGINE-YFGN 100 UNIT/ML ~~LOC~~ SOLN
15.0000 [IU] | Freq: Every day | SUBCUTANEOUS | Status: DC
  Administered 2021-07-08 – 2021-07-09 (×2): 15 [IU] via SUBCUTANEOUS
  Filled 2021-07-08 (×3): qty 0.15

## 2021-07-08 NOTE — Progress Notes (Signed)
?PROGRESS NOTE ? ? ? ?[name] [name]  BUL:[id number] DOB: 1975/11/09 DOA: 07/06/2021 ?PCP: Denita Lung, MD  ? ?Brief Narrative: ?46 year old with past medical history significant for CAD, hyperlipidemia, hypertension, anxiety, HIV, diabetes type 2 presents complaining of chest pain and abdominal pain that is started 3 days prior to admission.  Last alcoholic drink 2 to 3 days ago. ? ?Evaluation in the ED he was found to have severe elevation of blood pressure mild elevation of troponin.  CT abdomen pelvis: Resolution of previous pancreatic simple cyst, some inflammatory changes noted in the region of the head of the pancreas consistent with mild focal pancreatitis.  Mild biliary ductal dilation without definitive obstructive lesion. ? ? ?Assessment & Plan: ?  ?Principal Problem: ?  Hypertensive urgency ?Active Problems: ?  Tobacco use disorder ?  CAD (coronary artery disease), native coronary artery ?  Hyperlipidemia ?  Chest pain ?  Anxiety ?  Pancreatitis ?  Diabetes ([employer]) ?  HIV (human immunodeficiency virus infection) ([place]) ? ?1-Hypertensive urgency:  ?Patient with a systolic blood pressure of 321, diastolic 224 ?He received Nitropaste ?Metoprolol change to  carvedilol ?Start Norvasc ?Lisinopril  increase to 40 mg daily ? ? ?2-Chest pain, mild elevation of troponin: Continue with beta-blocker and a statin ?History of CAD status post stent ?Cardiology has been consulted ? ? ?3-Mild acute pancreatitis ?Advance diet as tolerated ?Right upper quadrant ultrasound: Negative for gallstones or biliary obstruction pattern. ?  ?4-Diabetes Type II:  continue with sliding scale insulin ?Start Long acting.  ? ?HIV: Continue with [place]  ? ?Hyperlipidemia: Continue with the statins ? ? ? ? ? ? ? ? ?Estimated body mass index is 23.26 kg/m? as calculated from the following: ?  Height as of this encounter: 5' 4"  (1.626 m). ?  Weight as of this encounter: 61.5 kg. ? ? ?DVT prophylaxis: Lovenox ?Code Status: Full code ?Family  Communication: Care discussed with patient.  ?Disposition Plan:  ?Status is: Inpatient ?Remains inpatient appropriate because: management of pancreatitis, HTN urgency ? ? ? ?Consultants:  ?Cardiology ? ?Procedures:  ?ECHO ? ?Antimicrobials:  ? ? ?Subjective: ?He report resolution of chest pain. Abdominal pain improved.  ? ? ?Objective: ?Vitals:  ? 07/08/21 0118 07/08/21 0503 07/08/21 1250 07/08/21 1253  ?BP: (!) 154/102 (!) 164/99 (!) 179/105 (!) 166/108  ?Pulse: 77 85  70  ?Resp: 18 16  18   ?Temp: 98.6 ?F (37 ?C) 98.5 ?F (36.9 ?C)  98.5 ?F (36.9 ?C)  ?TempSrc: Oral Oral  Oral  ?SpO2: 96% 98%  98%  ?Weight:      ?Height:      ? ? ?Intake/Output Summary (Last 24 hours) at 07/08/2021 1721 ?Last data filed at 07/08/2021 1300 ?Gross per 24 hour  ?Intake 4120.94 ml  ?Output 2300 ml  ?Net 1820.94 ml  ? ?Filed Weights  ? 07/07/21 1359  ?Weight: 61.5 kg  ? ? ?Examination: ? ?General exam: Appears calm and comfortable  ?Respiratory system: Clear to auscultation. Respiratory effort normal. ?Cardiovascular system: S1 & S2 heard, RRR.  ?Gastrointestinal system: Abdomen is nondistended, soft and nontender. No organomegaly or masses felt. Normal bowel sounds heard. ?Central nervous system: Alert and oriented.  ?Extremities: Symmetric 5 x 5 power. ?Skin: No rashes, lesions or ulcers ? ? ? ?Data Reviewed: I have personally reviewed following labs and imaging studies ? ?CBC: ?Recent Labs  ?Lab 07/06/21 ?2355 07/07/21 ?0931  ?WBC 9.5 10.1  ?NEUTROABS  --  7.2  ?HGB 16.2 16.7  ?HCT 45.5 46.3  ?MCV  90.8 90.3  ?PLT 171 149*  ? ?Basic Metabolic Panel: ?Recent Labs  ?Lab 07/06/21 ?2355 07/07/21 ?0931  ?NA 133* 136  ?K 3.7 3.6  ?CL 98 102  ?CO2 26 25  ?GLUCOSE 100* 146*  ?BUN 7 5*  ?CREATININE 0.56* 0.55*  ?CALCIUM 9.3 9.1  ? ?GFR: ?Estimated Creatinine Clearance: 95.6 mL/min (A) (by C-G formula based on SCr of 0.55 mg/dL (L)). ?Liver Function Tests: ?Recent Labs  ?Lab 07/06/21 ?0100 07/07/21 ?0931  ?AST 28 24  ?ALT 18 17  ?ALKPHOS 85 81   ?BILITOT 0.6 1.0  ?PROT 7.5 6.9  ?ALBUMIN 4.1 3.8  ? ?Recent Labs  ?Lab 07/07/21 ?0024  ?LIPASE 68*  ? ?No results for input(s): AMMONIA in the last 168 hours. ?Coagulation Profile: ?No results for input(s): INR, PROTIME in the last 168 hours. ?Cardiac Enzymes: ?No results for input(s): CKTOTAL, CKMB, CKMBINDEX, TROPONINI in the last 168 hours. ?BNP (last 3 results) ?No results for input(s): PROBNP in the last 8760 hours. ?HbA1C: ?Recent Labs  ?  07/06/21 ?1310  ?HGBA1C 8.8*  ? ?CBG: ?Recent Labs  ?Lab 07/07/21 ?1153 07/07/21 ?1651 07/08/21 ?0908 07/08/21 ?1206 07/08/21 ?1701  ?GLUCAP 160* 216* 168* 220* 327*  ? ?Lipid Profile: ?Recent Labs  ?  07/07/21 ?0822  ?CHOL 149  ?HDL 51  ?[place] 81  ?TRIG 87  ?CHOLHDL 2.9  ? ?Thyroid Function Tests: ?No results for input(s): TSH, T4TOTAL, FREET4, T3FREE, THYROIDAB in the last 72 hours. ?Anemia Panel: ?No results for input(s): VITAMINB12, FOLATE, FERRITIN, TIBC, IRON, RETICCTPCT in the last 72 hours. ?Sepsis Labs: ?No results for input(s): PROCALCITON, LATICACIDVEN in the last 168 hours. ? ?No results found for this or any previous visit (from the past 240 hour(s)).  ? ? ? ? ? ?Radiology Studies: ?DG Chest 2 View ? ?Result Date: 07/07/2021 ?CLINICAL DATA:  Chest pain. EXAM: CHEST - 2 VIEW COMPARISON:  Chest x-ray 10/18/2018. FINDINGS: There is blunting of the right costophrenic angle, new from prior. This is favored as scarring or small amount of loculated fluid. There is no lung consolidation or pneumothorax. The cardiomediastinal silhouette is within normal limits. Osseous structures are within normal limits. IMPRESSION: 1. Blunting of the right costophrenic angle may represent scarring or small amount of loculated pleural fluid. No focal lung infiltrate. Electronically Signed   By: Ronney Asters M.D.   On: 07/07/2021 00:16  ? ?CT ABDOMEN PELVIS W CONTRAST ? ?Result Date: 07/07/2021 ?CLINICAL DATA:  Abdominal pain and indigestion for 2 days, initial encounter EXAM: CT  ABDOMEN AND PELVIS WITH CONTRAST TECHNIQUE: Multidetector CT imaging of the abdomen and pelvis was performed using the standard protocol following bolus administration of intravenous contrast. RADIATION DOSE REDUCTION: This exam was performed according to the departmental dose-optimization program which includes automated exposure control, adjustment of the mA and/or kV according to patient size and/or use of iterative reconstruction technique. CONTRAST:  160m OMNIPAQUE IOHEXOL 300 MG/ML  SOLN COMPARISON:  12/09/2019 FINDINGS: Lower chest: No acute abnormality. Hepatobiliary: Liver shows mild intrahepatic ductal dilatation. Gallbladder is well distended. No definitive common bile duct stone is seen. Pancreas: Mild peripancreatic inflammatory changes noted in the region of the pancreatic head. This may represent some very mild focal pancreatitis. Pancreas is atrophic. Previously seen pseudocysts have resolved in the interval. Spleen: Normal in size without focal abnormality. Adrenals/Urinary Tract: Adrenal glands are within normal limits. Kidneys demonstrate a normal enhancement pattern bilaterally. Delayed image shows normal excretion bilaterally. The bladder is within normal limits. Stomach/Bowel: The appendix is within  normal limits. No obstructive or inflammatory changes of colon are seen. Stomach is unremarkable. No obstructive changes are noted in the small bowel. Vascular/Lymphatic: Vascular calcifications are seen. No significant lymphadenopathy is noted. 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 Reproductive: Prostate is unremarkable. Other: No abdominal wall hernia or abnormality. No abdominopelvic ascites. Musculoskeletal: No acute or significant osseous findings. 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 Some inflammatory changes noted in the region of the head of the pancreas consistent with  mild focal pancreatitis. Mild biliary ductal dilatation without definitive obstructive lesion. No other focal abnormality is noted. Electronically Signed   By: Inez Catalina M.D.   On: 07/07/2021 03:18  ? ?[place]

## 2021-07-08 NOTE — Consult Note (Signed)
?Cardiology Consultation:  ?Patient ID: [name] [name] ?MRN: [id number]; DOB: [birth year] ? ?Admit date: 07/06/2021 ?Date of Consult: 07/08/2021 ? ?Primary Care Provider: Denita Lung, MD ?Primary Cardiologist: Elouise Munroe, MD  ?Primary Electrophysiologist:  None  ? ?Patient Profile:  ?[name] [name] is a 46 y.o. male with a hx of CAD status post PCI to the LAD, diabetes, hypertension, HIV who is being seen today for the evaluation of chest pain at the request of Dr. Tyrell Antonio. ? ?History of Present Illness:  ?[name] was admitted to the hospital on 07/07/2021 with 3 days of abdominal pain, chest pain and back pain.  He reports for the past few days he had intense epigastric pain.  The pain then radiated into his chest and into his back.  He reports exercise or activity did not worsen his pain.  Food did not help and no association with worsening with certain food.  He does have a history of pancreatitis related to alcohol use in the past.  Apparently he has not had any alcohol in several days.  He then presented to [facility] long hospital where he was noted to be severely hypertensive.  Blood pressures were in the 190s.  Laboratory data notable for lipase 68.  CT abdomen pelvis consistent with mild focal pancreatitis.  Abdominal ultrasound negative for gallstones or biliary obstruction.  Chest x-ray clear.  Initial high-sensitivity troponin 72 and 63 on repeat.  EKG with sinus rhythm 56 and inferior T wave inversions.  Of note he has had these in the past.  Echocardiogram demonstrates LVEF 60-65% with no regional wall motion abnormalities.  He reports his symptoms have improved with hydration as well as avoiding food.  He is now on a clear diet.  Symptoms appear to have resolved completely. ? ?He does have a history of subtotal occlusion of the proximal to mid LAD status post PCI in 2016.  Had minimal disease in the RCA and circumflex.  Echocardiograms have demonstrated normal LV function. ? ?Heart Pathway Score:    ?   ? ?Past Medical History: ?Past Medical History:  ?Diagnosis Date  ? Anxiety   ? Coronary artery disease   ? a. 05/2014 OOH MI/PCI: LM nl, LAD 40p, 99p (3.0x28 Synergy DES) w/ R->L Collaterals, LCX nl, RCA 30p/d, EF 55% w/ sev distal inf/ant HK.  ? Depression   ? Diabetes mellitus without complication ([place])   ? Erectile dysfunction   ? H/O echocardiogram   ? a. 05/2014 Echo: Ef 45-50%, sev mid antsept/ant HK.  ? Hyperlipidemia   ? Hypertension   ? medication in 2012, stress as a big component  ? Neurosyphilis in male   ? hospitalization 04/19/2014.    ? Pancreatitis 10/2018  ? Tobacco abuse   ? ? ?Past Surgical History: ?Past Surgical History:  ?Procedure Laterality Date  ? CORONARY STENT PLACEMENT  05/24/2014  ? MID LAD DES   ? LEFT HEART CATHETERIZATION WITH CORONARY ANGIOGRAM N/A 05/24/2014  ? Procedure: LEFT HEART CATHETERIZATION WITH CORONARY ANGIOGRAM;  Surgeon: Jettie Booze, MD;  Location: [facility] CATH LAB;  Service: Cardiovascular;  Laterality: N/A;  ? PERCUTANEOUS CORONARY STENT INTERVENTION (PCI-S)  05/24/2014  ? Procedure: PERCUTANEOUS CORONARY STENT INTERVENTION (PCI-S);  Surgeon: Jettie Booze, MD;  Location[facility] LAB;  Service: Cardiovascular;;  ? PILONIDAL CYST EXCISION  1995  ?  ? ?Home Medications:  ?Prior to Admission medications   ?Medication Sig Start Date End Date Taking? Authorizing Provider  ?ALPRAZolam (XANAX) 0.5 MG tablet TAKE ONE  TABLET BY MOUTH DAILY AS NEEDED FOR ANXIETY ?Patient taking differently: Take 0.5 mg by mouth daily as needed for anxiety. 07/06/21  Yes Denita Lung, MD  ?aspirin EC 81 MG EC tablet Take 1 tablet (81 mg total) by mouth daily. 05/25/14  Yes Theora Gianotti, NP  ?doxycycline (VIBRA-TABS) 100 MG tablet Take 100 mg by mouth 2 (two) times daily.   Yes [provider]  ?emtricitabine-tenofovir (TRUVADA) 200-300 MG tablet Take 1 tablet by mouth daily. 11/13/19  Yes [provider]  ?Insulin Glargine (BASAGLAR KWIKPEN) 100 UNIT/ML  Inject 20 Units into the skin daily. 05/29/21  Yes Renato Shin, MD  ?insulin lispro (HUMALOG KWIKPEN) 100 UNIT/ML KwikPen Inject 9-12 Units into the skin 3 (three) times daily with meals. And pen needles 4/day 07/06/21  Yes Renato Shin, MD  ?lisinopril (ZESTRIL) 20 MG tablet Take 1 tablet (20 mg total) by mouth daily. 06/03/21  Yes Elouise Munroe, MD  ?metoprolol tartrate (LOPRESSOR) 50 MG tablet Take 1 tablet (50 mg total) by mouth 2 (two) times daily. 06/03/21  Yes Elouise Munroe, MD  ?atorvastatin (LIPITOR) 40 MG tablet TAKE ONE TABLET BY MOUTH DAILY AT 6PM ?Patient not taking: Reported on 07/07/2021 06/03/21   Elouise Munroe, MD  ?Blood Glucose Monitoring Suppl (ONETOUCH VERIO) w/Device KIT 1 kit by Does not apply route.    [provider]  ?Continuous Blood Gluc Sensor (FREESTYLE LIBRE 2 SENSOR) MISC 1 Device by Does not apply route every 14 (fourteen) days. 05/26/21   Renato Shin, MD  ?glucose blood [facility] VERIO) test strip Use as instructed to check blood sugar once daily. 07/10/20   Denita Lung, MD  ?Multiple Vitamin (MULTIVITAMIN WITH MINERALS) TABS tablet Take 1 tablet by mouth daily. ?Patient not taking: Reported on 07/07/2021    [provider]  ?OneTouch Delica Lancets 17B MISC 1 each by Does not apply route daily. Check blood sugar once daily 07/10/20   Denita Lung, MD  ? ? ?Inpatient Medications: ?Scheduled Meds: ? amLODipine  5 mg Oral Daily  ? aspirin EC  81 mg Oral Daily  ? atorvastatin  40 mg Oral q1800  ? doxycycline  100 mg Oral Q12H  ? emtricitabine-tenofovir AF  1 tablet Oral Daily  ? enoxaparin (LOVENOX) injection  40 mg Subcutaneous Q24H  ? insulin aspart  0-15 Units Subcutaneous TID WC  ? lisinopril  20 mg Oral Daily  ? metoprolol tartrate  25 mg Oral BID  ? ?Continuous Infusions: ? sodium chloride 100 mL/hr at 07/08/21 0908  ? ?PRN Meds: ?acetaminophen, ALPRAZolam, hydrALAZINE, ondansetron (ZOFRAN) IV, oxyCODONE-acetaminophen, prochlorperazine ? ?Allergies:     ?Allergies  ?Allergen Reactions  ? Gadolinium Derivatives Nausea Only  ?  MRI dye  ? ? ?Social History:   ?Social History  ? ?Socioeconomic History  ? Marital status: Single  ?  Spouse name: Not on file  ? Number of children: Not on file  ? Years of education: Not on file  ? Highest education level: Not on file  ?Occupational History  ? Occupation: Secondary school teacher  ?Tobacco Use  ? Smoking status: Every Day  ?  Packs/day: 1.00  ?  Years: 23.00  ?  Pack years: 23.00  ?  Types: Cigarettes  ? Smokeless tobacco: Never  ? Tobacco comments:  ?  currently smoking 0.5 ppd, declines patch  ?Vaping Use  ? Vaping Use: Never used  ?Substance and Sexual Activity  ? Alcohol use: Yes  ?  Alcohol/week:  24.0 standard drinks  ?  Types: 24 Cans of beer per week  ?  Comment: 1/5-3/4 [place], 4-5 times a week.  +Guilty, cutting down; 2/4 CAGE questions.  ? Drug use: No  ? Sexual activity: Yes  ?  Partners: Male  ?Other Topics Concern  ? Not on file  ?Social History Narrative  ? Lives at home with roommate, exercise at gym in apartment complex, diet - no discretion.      ? ?Social Determinants of Health  ? ?Financial Resource Strain: Not on file  ?Food Insecurity: Not on file  ?Transportation Needs: Not on file  ?Physical Activity: Not on file  ?Stress: Not on file  ?Social Connections: Not on file  ?Intimate Partner Violence: Not on file  ?  ? ?Family History:   ? ?Family History  ?Problem Relation Age of Onset  ? Thyroid disease Mother   ? Heart disease Father 64  ?     MI, stents  ? Peripheral Artery Disease Father   ? Diabetes type II Father   ? Diabetes Father   ? Colon cancer Father   ? Diabetes Maternal Grandmother   ? Hypertension Maternal Grandfather   ? Hypertension Paternal Grandfather   ? Cancer Neg Hx   ? Stroke Neg Hx   ? Other Neg Hx   ?     hypogonadism  ? Pancreatic cancer Neg Hx   ? Esophageal cancer Neg Hx   ?  ? ?ROS:  ?All other ROS reviewed and negative. Pertinent positives noted in the HPI.    ? ?Physical  Exam/Data:  ? ?Vitals:  ? 07/07/21 1921 07/07/21 2244 07/08/21 0118 07/08/21 0503  ?BP: (!) 159/110 (!) 159/106 (!) 154/102 (!) 164/99  ?Pulse: 72 90 77 85  ?Resp: 18 18 18 16   ?Temp: 98.5 ?F (36.9 ?C

## 2021-07-08 NOTE — Plan of Care (Signed)
?  Problem: Clinical Measurements: ?Goal: Ability to maintain clinical measurements within normal limits will improve ?Outcome: Progressing ?Goal: Will remain free from infection ?Outcome: Progressing ?Goal: Diagnostic test results will improve ?Outcome: Progressing ?  ?

## 2021-07-08 NOTE — Progress Notes (Signed)
Pt BP 159/106 HR 90. BP Does not meet parameters for PRN BP med. Providers notified. No new orders at this time. ?

## 2021-07-08 NOTE — TOC Initial Note (Signed)
Transition of Care (TOC) - Initial/Assessment Note  ? ? ?Patient Details  ?Name: [name] [name] ?MRN: [id number] ?Date of Birth: [birth year] ? ?Transition of Care [facility]) CM/SW Contact:    ?Leeroy Cha, RN ?Phone Number: ?07/08/2021, 10:47 AM ? ?Clinical Narrative:                 ? ?Transition of Care (TOC) Screening Note ? ? ?Patient Details  ?Name: [name] [name] ?Date of Birth: 27-May-1975 ? ? ?Transition of Care [facility]) CM/SW Contact:    ?Leeroy Cha, RN ?Phone Number: ?07/08/2021, 10:47 AM ? ? ? ?Transition of Care Department [facility]) has reviewed patient and no TOC needs have been identified at this time. We will continue to monitor patient advancement through interdisciplinary progression rounds. If new patient transition needs arise, please place a TOC consult. ? ? ? ?Expected Discharge Plan: Home/Self Care ?Barriers to Discharge: No Barriers Identified ? ? ?Patient Goals and CMS Choice ?Patient states their goals for this hospitalization and ongoing recovery are:: to go home ?CMS Medicare.gov Compare Post Acute Care list provided to:: Patient ?  ? ?Expected Discharge Plan and Services ?Expected Discharge Plan: Home/Self Care ?  ?Discharge Planning Services: CM Consult ?  ?Living arrangements for the past 2 months: Apartment ?                ?  ?  ?  ?  ?  ?  ?  ?  ?  ?  ? ?Prior Living Arrangements/Services ?Living arrangements for the past 2 months: Apartment ?Lives with:: Self ?Patient language and need for interpreter reviewed:: Yes ?Do you feel safe going back to the place where you live?: Yes      ?  ?  ?  ?Criminal Activity/Legal Involvement Pertinent to Current Situation/Hospitalization: No - Comment as needed ? ?Activities of Daily Living ?Home Assistive Devices/Equipment: CBG Meter ?ADL Screening (condition at time of admission) ?Patient's cognitive ability adequate to safely complete daily activities?: Yes ?Is the patient deaf or have difficulty hearing?: No ?Does the patient have difficulty  seeing, even when wearing glasses/contacts?: No ?Does the patient have difficulty concentrating, remembering, or making decisions?: No ?Patient able to express need for assistance with ADLs?: Yes ?Does the patient have difficulty dressing or bathing?: No ?Independently performs ADLs?: Yes (appropriate for developmental age) ?Does the patient have difficulty walking or climbing stairs?: No ?Weakness of Legs: None ?Weakness of Arms/Hands: None ? ?Permission Sought/Granted ?  ?  ?   ?   ?   ?   ? ?Emotional Assessment ?Appearance:: Appears stated age ?  ?  ?Orientation: : Oriented to Self, Oriented to Place, Oriented to  Time, Oriented to Situation ?Alcohol / Substance Use: Not Applicable ?Psych Involvement: No (comment) ? ?Admission diagnosis:  Hypertensive urgency [I16.0] ?Acute pancreatitis, unspecified complication status, unspecified pancreatitis type [K85.90] ?Chest pain [R07.9] ?Patient Active Problem List  ? Diagnosis Date Noted  ? Hypertensive urgency 07/07/2021  ? HIV (human immunodeficiency virus infection) ([place]) 07/07/2021  ? Alcohol use disorder, mild, in early remission 04/01/2020  ? Diabetes ([place]) 02/08/2020  ? Hypogonadism in male 12/26/2019  ? Pancreatitis 12/09/2019  ? Pancreatic pseudocyst 12/09/2019  ? History of pancreatitis 05/30/2019  ? Anxiety 05/30/2019  ? Chest pain 11/10/2017  ? Food allergy 12/24/2015  ? Hyperlipidemia   ? Hypertension   ? CAD (coronary artery disease), native coronary artery 05/24/2014  ? Family history of premature CAD 05/08/2014  ? History of syphilis   ? High risk sexual  behavior   ? Tobacco use disorder   ? ?PCP:  Denita Lung, MD ?Pharmacy:   ?[id number] [place] [place] [facility] [address] ?[place] [place] ?[place] [address] ?Phone: [phone] Fax: [phone] ? ? ? ? ?Social Determinants of Health (SDOH) Interventions ?  ? ?Readmission Risk Interventions ?   ? View : No data to display.  ?  ?  ?   ? ? ? ?

## 2021-07-09 DIAGNOSIS — I160 Hypertensive urgency: Secondary | ICD-10-CM | POA: Diagnosis not present

## 2021-07-09 LAB — CBC
HCT: 45.5 % (ref 39.0–52.0)
Hemoglobin: 15.1 g/dL (ref 13.0–17.0)
MCH: 30.9 pg (ref 26.0–34.0)
MCHC: 33.2 g/dL (ref 30.0–36.0)
MCV: 93 fL (ref 80.0–100.0)
Platelets: 137 10*3/uL — ABNORMAL LOW (ref 150–400)
RBC: 4.89 MIL/uL (ref 4.22–5.81)
RDW: 16.3 % — ABNORMAL HIGH (ref 11.5–15.5)
WBC: 6.6 10*3/uL (ref 4.0–10.5)
nRBC: 0 % (ref 0.0–0.2)

## 2021-07-09 LAB — BASIC METABOLIC PANEL
Anion gap: 8 (ref 5–15)
BUN: 5 mg/dL — ABNORMAL LOW (ref 6–20)
CO2: 22 mmol/L (ref 22–32)
Calcium: 8.9 mg/dL (ref 8.9–10.3)
Chloride: 105 mmol/L (ref 98–111)
Creatinine, Ser: 0.52 mg/dL — ABNORMAL LOW (ref 0.61–1.24)
GFR, Estimated: >60 mL/min (ref >60)
Glucose, Bld: 148 mg/dL — ABNORMAL HIGH (ref 70–99)
Potassium: 3.8 mmol/L (ref 3.5–5.1)
Sodium: 135 mmol/L (ref 135–145)

## 2021-07-09 LAB — GLUCOSE, CAPILLARY
Glucose-Capillary: 150 mg/dL — ABNORMAL HIGH (ref 70–99)
Glucose-Capillary: 217 mg/dL — ABNORMAL HIGH (ref 70–99)

## 2021-07-09 MED ORDER — CARVEDILOL 25 MG PO TABS: 25.0000 mg | ORAL_TABLET | Freq: Two times a day (BID) | ORAL | Status: DC

## 2021-07-09 MED ORDER — ACETAMINOPHEN 325 MG PO TABS: 650.0000 mg | ORAL_TABLET | ORAL | 0 refills | Status: DC | PRN

## 2021-07-09 MED ORDER — AMLODIPINE BESYLATE 5 MG PO TABS: 5.0000 mg | ORAL_TABLET | Freq: Every day | ORAL | 3 refills | Status: DC

## 2021-07-09 MED ORDER — CARVEDILOL 25 MG PO TABS: 25.0000 mg | ORAL_TABLET | Freq: Two times a day (BID) | ORAL | 3 refills | Status: DC

## 2021-07-09 MED ORDER — LISINOPRIL 40 MG PO TABS: 40.0000 mg | ORAL_TABLET | Freq: Every day | ORAL | 3 refills | Status: DC

## 2021-07-09 NOTE — TOC Transition Note (Signed)
Transition of Care (TOC) - CM/SW Discharge Note ? ? ?Patient Details  ?Name: Emre Stock ?MRN: [id number] ?Date of Birth: February 28, 1976 ? ?Transition of Care [facility]) CM/SW Contact:  ?Leeroy Cha, RN ?Phone Number: ?07/09/2021, 10:52 AM ? ? ?Clinical Narrative:    ?Dcd to home with no tic needs present. ? ? ?Final next level of care: Home/Self Care ?Barriers to Discharge: No Barriers Identified ? ? ?Patient Goals and CMS Choice ?Patient states their goals for this hospitalization and ongoing recovery are:: to go home ?CMS Medicare.gov Compare Post Acute Care list provided to:: Patient ?  ? ?Discharge Placement ?  ?           ?  ?  ?  ?  ? ?Discharge Plan and Services ?  ?Discharge Planning Services: CM Consult ?           ?  ?  ?  ?  ?  ?  ?  ?  ?  ?  ? ?Social Determinants of Health (SDOH) Interventions ?  ? ? ?Readmission Risk Interventions ?   ? View : No data to display.  ?  ?  ?  ? ? ? ? ? ?

## 2021-07-09 NOTE — Discharge Summary (Signed)
Physician Discharge Summary  ?[name] [name] BMW:[id number] DOB: 28-Mar-1975 DOA: 07/06/2021 ? ?PCP: Denita Lung, MD ? ?Admit date: 07/06/2021 ?Discharge date: 07/11/2021 ? ?Admitted From: Home  ?Disposition:  Home  ? ?Recommendations for Outpatient Follow-up:  ?Follow up with PCP in 1-2 weeks ?Please obtain BMP/CBC in one week ?Follow up with cardiology for further care.  ? ?Home Health: none ? ?Discharge Condition: Stable.  ?CODE STATUS:Full Code ?Diet recommendation: Heart Healthy  ? ?Brief/Interim Summary: ?46 year old with past medical history significant for CAD, hyperlipidemia, hypertension, anxiety, HIV, diabetes type 2 presents complaining of chest pain and abdominal pain that is started 3 days prior to admission.  Last alcoholic drink 2 to 3 days ago. ?  ?Evaluation in the ED he was found to have severe elevation of blood pressure mild elevation of troponin.  CT abdomen pelvis: Resolution of previous pancreatic simple cyst, some inflammatory changes noted in the region of the head of the pancreas consistent with mild focal pancreatitis.  Mild biliary ductal dilation without definitive obstructive lesion. ?  ?1-Hypertensive urgency:  ?Patient with a systolic blood pressure of 272, diastolic 536 ?He received Nitropaste ?Metoprolol change to  carvedilol ?Started on  Norvasc ?Lisinopril  increased  to 40 mg daily ?Will provide prescription for medications.  ?  ?2-Chest pain, mild elevation of troponin: Continue with beta-blocker and a statin ?History of CAD status post stent ?Cardiology has been consulted ?Chest pain free.  ?  ?3-Mild acute pancreatitis ?Advance diet as tolerated ?Right upper quadrant ultrasound: Negative for gallstones or biliary obstruction pattern. ? tolerating diet.  ? ?4-Diabetes Type II:  continue with sliding scale insulin ?Started  Long acting.  ?  ?HIV: Continue with Descovy  ?  ?Hyperlipidemia: Continue with the statins ?  ?Stable for discharge  ? ? ?Discharge Diagnoses:  ?Principal  Problem: ?  Hypertensive urgency ?Active Problems: ?  Tobacco use disorder ?  CAD (coronary artery disease), native coronary artery ?  Hyperlipidemia ?  Chest pain ?  Anxiety ?  Pancreatitis ?  Diabetes ([name] [name]) ?  HIV (human immunodeficiency virus infection) ([place]) ? ? ? ?Discharge Instructions ? ?Discharge Instructions   ? ? Increase activity slowly   Complete by: As directed ?  ? ?  ? ?Allergies as of 07/09/2021   ? ?   Reactions  ? Gadolinium Derivatives Nausea Only  ? MRI dye  ? ?  ? ?  ?Medication List  ?  ? ?STOP taking these medications   ? ?metoprolol tartrate 50 MG tablet ?Commonly known as: LOPRESSOR ?  ? ?  ? ?TAKE these medications   ? ?acetaminophen 325 MG tablet ?Commonly known as: TYLENOL ?Take 2 tablets (650 mg total) by mouth every 4 (four) hours as needed for headache or mild pain. ?  ?ALPRAZolam 0.5 MG tablet ?Commonly known as: Duanne Moron ?TAKE ONE TABLET BY MOUTH DAILY AS NEEDED FOR ANXIETY ?What changed:  ?how much to take ?how to take this ?when to take this ?reasons to take this ?additional instructions ?  ?amLODipine 5 MG tablet ?Commonly known as: NORVASC ?Take 1 tablet (5 mg total) by mouth daily. ?  ?aspirin 81 MG EC tablet ?Take 1 tablet (81 mg total) by mouth daily. ?  ?atorvastatin 40 MG tablet ?Commonly known as: LIPITOR ?TAKE ONE TABLET BY MOUTH DAILY AT 6PM ?  ?Basaglar KwikPen 100 UNIT/ML ?Inject 20 Units into the skin daily. ?  ?carvedilol 25 MG tablet ?Commonly known as: COREG ?Take 1 tablet (25 mg total) by mouth 2 (  two) times daily with a meal. ?  ?doxycycline 100 MG tablet ?Commonly known as: VIBRA-TABS ?Take 100 mg by mouth 2 (two) times daily. ?  ?emtricitabine-tenofovir 200-300 MG tablet ?Commonly known as: TRUVADA ?Take 1 tablet by mouth daily. ?  ?FreeStyle Libre 2 Sensor Misc ?1 Device by Does not apply route every 14 (fourteen) days. ?  ?insulin lispro 100 UNIT/ML KwikPen ?Commonly known as: HumaLOG KwikPen ?Inject 9-12 Units into the skin 3 (three) times daily with meals. And  pen needles 4/day ?  ?lisinopril 40 MG tablet ?Commonly known as: ZESTRIL ?Take 1 tablet (40 mg total) by mouth daily. ?What changed:  ?medication strength ?how much to take ?  ?multivitamin with minerals Tabs tablet ?Take 1 tablet by mouth daily. ?  ?OneTouch Delica Lancets 16S Misc ?1 each by Does not apply route daily. Check blood sugar once daily ?  ?OneTouch Verio test strip ?Generic drug: glucose blood ?Use as instructed to check blood sugar once daily. ?  ?OneTouch Verio w/Device Kit ?1 kit by Does not apply route. ?  ? ?  ? ? ?Allergies  ?Allergen Reactions  ? Gadolinium Derivatives Nausea Only  ?  MRI dye  ? ? ?Consultations: ?Cardiology ? ? ?Procedures/Studies: ?DG Chest 2 View ? ?Result Date: 07/07/2021 ?CLINICAL DATA:  Chest pain. EXAM: CHEST - 2 VIEW COMPARISON:  Chest x-ray 10/18/2018. FINDINGS: There is blunting of the right costophrenic angle, new from prior. This is favored as scarring or small amount of loculated fluid. There is no lung consolidation or pneumothorax. The cardiomediastinal silhouette is within normal limits. Osseous structures are within normal limits. IMPRESSION: 1. Blunting of the right costophrenic angle may represent scarring or small amount of loculated pleural fluid. No focal lung infiltrate. Electronically Signed   By: Ronney Asters M.D.   On: 07/07/2021 00:16  ? ?CT ABDOMEN PELVIS W CONTRAST ? ?Result Date: 07/07/2021 ?CLINICAL DATA:  Abdominal pain and indigestion for 2 days, initial encounter EXAM: CT ABDOMEN AND PELVIS WITH CONTRAST TECHNIQUE: Multidetector CT imaging of the abdomen and pelvis was performed using the standard protocol following bolus administration of intravenous contrast. RADIATION DOSE REDUCTION: This exam was performed according to the departmental dose-optimization program which includes automated exposure control, adjustment of the mA and/or kV according to patient size and/or use of iterative reconstruction technique. CONTRAST:  152m OMNIPAQUE IOHEXOL  300 MG/ML  SOLN COMPARISON:  12/09/2019 FINDINGS: Lower chest: No acute abnormality. Hepatobiliary: Liver shows mild intrahepatic ductal dilatation. Gallbladder is well distended. No definitive common bile duct stone is seen. Pancreas: Mild peripancreatic inflammatory changes noted in the region of the pancreatic head. This may represent some very mild focal pancreatitis. Pancreas is atrophic. Previously seen pseudocysts have resolved in the interval. Spleen: Normal in size without focal abnormality. Adrenals/Urinary Tract: Adrenal glands are within normal limits. Kidneys demonstrate a normal enhancement pattern bilaterally. Delayed image shows normal excretion bilaterally. The bladder is within normal limits. Stomach/Bowel: The appendix is within normal limits. No obstructive or inflammatory changes of colon are seen. Stomach is unremarkable. No obstructive changes are noted in the small bowel. Vascular/Lymphatic: Vascular calcifications are seen. No significant lymphadenopathy is noted. 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 Reproductive: Prostate is unremarkable. Other: No abdominal wall hernia or abnormality. No abdominopelvic ascites. Musculoskeletal: No acute or significant osseous findings. 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 Some inflammatory changes noted in the region of the head of the pancreas consistent  with mild focal pancreatitis. Mild biliary ductal dilatation without definitive obstructive lesion. No other focal abnormality is noted. Electronically Signed   By: Inez Catalina M.D.   On: 07/07/2021 03:18  ? ?ECHOCARDIOGRAM COMPLETE ? ?Result Date: 07/07/2021 ?   ECHOCARDIOGRAM REPORT   Patient Name:   [name] [name] Date of Exam: 07/07/2021 Medical Rec #:  [id number]      Height:       64.0 in Accession #:    [id number]     Weight:       137.8 lb Date of Birth:  03-Jan-1976      BSA:           1.670 m? Patient Age:    16 years       BP:           166/112 mmHg Patient Gender: M              HR:           75 bpm. Exam Location:  Inpatient Procedure: 2D Echo, Color Doppler and Cardiac Doppler Indic

## 2021-07-09 NOTE — Plan of Care (Signed)

## 2021-07-09 NOTE — Progress Notes (Signed)
?Cardiology Progress Note  ?Patient ID: [name] [name] ?MRN: [id number] ?DOB: 03/02/1976 ?Date of Encounter: 07/09/2021 ? ?Primary Cardiologist: Elouise Munroe, MD ? ?Subjective  ? ?Chief Complaint: None.  ? ?HPI: BP better.  No complaints.  Diet is being advanced. ? ?ROS:  ?All other ROS reviewed and negative. Pertinent positives noted in the HPI.    ? ?Inpatient Medications  ?Scheduled Meds: ? amLODipine  5 mg Oral Daily  ? aspirin EC  81 mg Oral Daily  ? atorvastatin  40 mg Oral q1800  ? carvedilol  25 mg Oral BID WC  ? doxycycline  100 mg Oral Q12H  ? emtricitabine-tenofovir AF  1 tablet Oral Daily  ? enoxaparin (LOVENOX) injection  40 mg Subcutaneous Q24H  ? insulin aspart  0-15 Units Subcutaneous TID WC  ? insulin glargine-yfgn  15 Units Subcutaneous Daily  ? lisinopril  40 mg Oral Daily  ? ?Continuous Infusions: ? sodium chloride 100 mL/hr at 07/09/21 0454  ? ?PRN Meds: ?acetaminophen, ALPRAZolam, hydrALAZINE, ondansetron (ZOFRAN) IV, oxyCODONE-acetaminophen, prochlorperazine  ? ?Vital Signs  ? ?Vitals:  ? 07/08/21 1250 07/08/21 1253 07/08/21 2033 07/09/21 0423  ?BP: (!) 179/105 (!) 166/108 (!) 147/103 (!) 137/103  ?Pulse:  70 87 82  ?Resp:  18 18 16   ?Temp:  98.5 ?F (36.9 ?C) 98.5 ?F (36.9 ?C) 97.9 ?F (36.6 ?C)  ?TempSrc:  Oral Oral Oral  ?SpO2:  98% 99% 100%  ?Weight:      ?Height:      ? ? ?Intake/Output Summary (Last 24 hours) at 07/09/2021 0946 ?Last data filed at 07/09/2021 0909 ?Gross per 24 hour  ?Intake 3171.18 ml  ?Output 2350 ml  ?Net 821.18 ml  ? ? ?  07/07/2021  ?  1:59 PM 07/06/2021  ? 12:59 PM 07/06/2021  ? 10:37 AM  ?Last 3 Weights  ?Weight (lbs) 135 lb 8 oz 137 lb 12.8 oz 137 lb 6.4 oz  ?Weight (kg) 61.462 kg 62.506 kg 62.324 kg  ?   ? ?Telemetry  ?Overnight telemetry shows sinus rhythm in the 90s, which I personally reviewed.  ? ? ?Physical Exam  ? ?Vitals:  ? 07/08/21 1250 07/08/21 1253 07/08/21 2033 07/09/21 0423  ?BP: (!) 179/105 (!) 166/108 (!) 147/103 (!) 137/103  ?Pulse:  70 87 82  ?Resp:  18 18  16   ?Temp:  98.5 ?F (36.9 ?C) 98.5 ?F (36.9 ?C) 97.9 ?F (36.6 ?C)  ?TempSrc:  Oral Oral Oral  ?SpO2:  98% 99% 100%  ?Weight:      ?Height:      ?  ?Intake/Output Summary (Last 24 hours) at 07/09/2021 0946 ?Last data filed at 07/09/2021 0909 ?Gross per 24 hour  ?Intake 3171.18 ml  ?Output 2350 ml  ?Net 821.18 ml  ?  ? ?  07/07/2021  ?  1:59 PM 07/06/2021  ? 12:59 PM 07/06/2021  ? 10:37 AM  ?Last 3 Weights  ?Weight (lbs) 135 lb 8 oz 137 lb 12.8 oz 137 lb 6.4 oz  ?Weight (kg) 61.462 kg 62.506 kg 62.324 kg  ?  Body mass index is 23.26 kg/m?.  ?General: Well nourished, well developed, in no acute distress ?Head: Atraumatic, normal size  ?Eyes: PEERLA, EOMI  ?Neck: Supple, no JVD ?Endocrine: No thryomegaly ?Cardiac: Normal S1, S2; RRR; no murmurs, rubs, or gallops ?Lungs: Clear to auscultation bilaterally, no wheezing, rhonchi or rales  ?Abd: Soft, nontender, no hepatomegaly  ?Ext: No edema, pulses 2+ ?Musculoskeletal: No deformities, BUE and BLE strength normal and equal ?Skin: Warm and  dry, no rashes   ?Neuro: Alert and oriented to person, place, time, and situation, CNII-XII grossly intact, no focal deficits  ?Psych: Normal mood and affect  ? ?Labs  ?High Sensitivity Troponin:   ?Recent Labs  ?Lab 07/06/21 ?2355 07/07/21 ?0155  ?TROPONINIHS 72* 63*  ?   ?Cardiac EnzymesNo results for input(s): TROPONINI in the last 168 hours. No results for input(s): TROPIPOC in the last 168 hours.  ?Chemistry ?Recent Labs  ?Lab 07/06/21 ?0100 07/06/21 ?2355 07/07/21 ?1638 07/09/21 ?4536  ?NA  --  133* 136 135  ?K  --  3.7 3.6 3.8  ?CL  --  98 102 105  ?CO2  --  26 25 22   ?GLUCOSE  --  100* 146* 148*  ?BUN  --  7 5* 5*  ?CREATININE  --  0.56* 0.55* 0.52*  ?CALCIUM  --  9.3 9.1 8.9  ?PROT 7.5  --  6.9  --   ?ALBUMIN 4.1  --  3.8  --   ?AST 28  --  24  --   ?ALT 18  --  17  --   ?ALKPHOS 85  --  81  --   ?BILITOT 0.6  --  1.0  --   ?GFRNONAA  --  >60 >60 >60  ?ANIONGAP  --  9 9 8   ?  ?Hematology ?Recent Labs  ?Lab 07/06/21 ?2355 07/07/21 ?4680  07/09/21 ?3212  ?WBC 9.5 10.1 6.6  ?RBC 5.01 5.13 4.89  ?HGB 16.2 16.7 15.1  ?HCT 45.5 46.3 45.5  ?MCV 90.8 90.3 93.0  ?MCH 32.3 32.6 30.9  ?MCHC 35.6 36.1* 33.2  ?RDW 16.1* 15.9* 16.3*  ?PLT 171 149* 137*  ? ?BNPNo results for input(s): BNP, PROBNP in the last 168 hours.  ?DDimer No results for input(s): DDIMER in the last 168 hours.  ? ?Radiology  ?ECHOCARDIOGRAM COMPLETE ? ?Result Date: 07/07/2021 ?   ECHOCARDIOGRAM REPORT   Patient Name:   [name] [name] Date of Exam: 07/07/2021 Medical Rec #:  [id number]      Height:       64.0 in Accession #:    [id number]     Weight:       137.8 lb Date of Birth:  1975/09/02      BSA:          1.670 m? Patient Age:    46 years       BP:           166/112 mmHg Patient Gender: M              HR:           75 bpm. Exam Location:  Inpatient Procedure: 2D Echo, Color Doppler and Cardiac Doppler Indications:     R07.9* Chest pain, unspecified  History:         Patient has prior history of Echocardiogram examinations, most                  recent 07/04/2019. CAD; Risk Factors:Hypertension, Diabetes and                  Dyslipidemia.  Sonographer:     [place] Referring Phys:  [id number] Jonnie Finner Diagnosing Phys: Oswaldo Milian MD IMPRESSIONS  1. Left ventricular ejection fraction, by estimation, is 60 to 65%. The left ventricle has normal function. The left ventricle has no regional wall motion abnormalities. Left ventricular diastolic parameters are indeterminate.  2. Right ventricular systolic function is normal. The right  ventricular size is normal. Tricuspid regurgitation signal is inadequate for assessing PA pressure.  3. The mitral valve is normal in structure. Trivial mitral valve regurgitation. No evidence of mitral stenosis.  4. The aortic valve was not well visualized. Aortic valve regurgitation is trivial. No aortic stenosis is present.  5. The inferior vena cava is normal in size with greater than 50% respiratory variability, suggesting right atrial pressure of  3 mmHg. FINDINGS  Left Ventricle: Left ventricular ejection fraction, by estimation, is 60 to 65%. The left ventricle has normal function. The left ventricle has no regional wall motion abnormalities. The left ventricular internal cavity size was normal in size. There is  no left ventricular hypertrophy. Left ventricular diastolic parameters are indeterminate. Right Ventricle: The right ventricular size is normal. No increase in right ventricular wall thickness. Right ventricular systolic function is normal. Tricuspid regurgitation signal is inadequate for assessing PA pressure. Left Atrium: Left atrial size was normal in size. Right Atrium: Right atrial size was normal in size. Pericardium: There is no evidence of pericardial effusion. Mitral Valve: The mitral valve is normal in structure. Trivial mitral valve regurgitation. No evidence of mitral valve stenosis. Tricuspid Valve: The tricuspid valve is normal in structure. Tricuspid valve regurgitation is trivial. Aortic Valve: The aortic valve was not well visualized. Aortic valve regurgitation is trivial. No aortic stenosis is present. Pulmonic Valve: The pulmonic valve was not well visualized. Pulmonic valve regurgitation is not visualized. Aorta: The aortic root and ascending aorta are structurally normal, with no evidence of dilitation. Venous: The inferior vena cava is normal in size with greater than 50% respiratory variability, suggesting right atrial pressure of 3 mmHg. IAS/Shunts: The interatrial septum was not well visualized.  LEFT VENTRICLE PLAX 2D LVIDd:         4.30 cm   Diastology LVIDs:         2.50 cm   LV e' medial:    6.31 cm/s LV PW:         1.00 cm   LV E/e' medial:  13.9 LV IVS:        0.90 cm   LV e' lateral:   8.59 cm/s LVOT diam:     1.90 cm   LV E/e' lateral: 10.2 LV SV:         52 LV SV Index:   31 LVOT Area:     2.84 cm?  RIGHT VENTRICLE RV S prime:     11.00 cm/s TAPSE (M-mode): 2.1 cm LEFT ATRIUM             Index        RIGHT ATRIUM            Index LA diam:        3.60 cm 2.16 cm/m?   RA Area:     13.20 cm? LA Vol (A2C):   44.8 ml 26.83 ml/m?  RA Volume:   29.70 ml  17.79 ml/m? LA Vol (A4C):   30.0 ml 17.96 ml/m? LA Biplane Vol: 39

## 2021-07-20 ENCOUNTER — Ambulatory Visit (INDEPENDENT_AMBULATORY_CARE_PROVIDER_SITE_OTHER): Payer: Commercial Managed Care - HMO | Admitting: Internal Medicine

## 2021-07-20 ENCOUNTER — Encounter: Payer: Self-pay | Admitting: Internal Medicine

## 2021-07-20 VITALS — BP 128/86 | HR 77 | Ht 64.0 in | Wt 134.6 lb

## 2021-07-20 DIAGNOSIS — I2510 Atherosclerotic heart disease of native coronary artery without angina pectoris: Secondary | ICD-10-CM

## 2021-07-20 DIAGNOSIS — I10 Essential (primary) hypertension: Secondary | ICD-10-CM

## 2021-07-20 DIAGNOSIS — R42 Dizziness and giddiness: Secondary | ICD-10-CM

## 2021-07-20 DIAGNOSIS — I160 Hypertensive urgency: Secondary | ICD-10-CM

## 2021-07-20 DIAGNOSIS — E785 Hyperlipidemia, unspecified: Secondary | ICD-10-CM

## 2021-07-20 DIAGNOSIS — G4719 Other hypersomnia: Secondary | ICD-10-CM

## 2021-07-20 DIAGNOSIS — Z79899 Other long term (current) drug therapy: Secondary | ICD-10-CM

## 2021-07-20 DIAGNOSIS — R400 Somnolence: Secondary | ICD-10-CM

## 2021-07-20 DIAGNOSIS — Z720 Tobacco use: Secondary | ICD-10-CM

## 2021-07-20 NOTE — Progress Notes (Signed)
Cardiology Office Note:    Date: 07/20/2021  ID:  [name] [name], DOB [birth year], MRN [id number]  PCP:  Denita Lung, MD  Cardiologist:  Elouise Munroe, MD  Electrophysiologist:  None   Referring MD: Denita Lung, MD   Chief Complaint/Reason for Referral: CAD, prior anterior infarct, f/u dizziness/lightheadedness  History of Present Illness:    [name] [name] is a 46 y.o. 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 He was found to have a subtotal LAD which had occluded at the time of his intervention and he had a stent placed. EF normal in 06/2019. He also has a history of depression, hyperlipidemia, hypertension, prior neurosyphilis, pancreatitis with alcohol use, tobacco abuse. He endorses daytime fatigue. Sleep study declined by his insurance. Fatigue is much better so may be able to defer indefinitely per patient request.   At his last appointment, he presented for a cardiovascular check prior to [place] out of state. He reported plans to move to [facility] where his family is located.  His father passed away one year prior. I expressed my condolences.    He was admitted to the hospital 07/06/2021 for acute pancreatitis. He had complained of chest and abdominal pain for 3 days prior to admission. He was found to have severely elevated blood pressure and mild elevation of troponin. CT of the abdomen and pelvis noted inflammatory changes consistent with mild focal pancreatitis. His BP was 212/133; received Nitropaste. Metoprolol was changed to carvedilol, lisinopril was increased to 40 mg daily, and he was started on Norvasc.  For the first five days following his hospital admittance he noticed symptoms of imbalance and numbness in his right foot. This has not recurred in the past 10 days or so. He believes it may have been related to his medication changes made in the hospital.  Generally he is feeling better at this time with no  recurrent chest pain. His blood pressure has been better controlled.  He remains compliant with all of his medications.  He denies any palpitations, chest pain, shortness of breath, or peripheral edema. No lightheadedness, headaches, syncope, orthopnea, or PND.  Due to his recent health issues he has postponed his move to [place].    Past Medical History:  Diagnosis Date   Anxiety    Coronary artery disease    a. 05/2014 OOH MI/PCI: LM nl, LAD 40p, 99p (3.0x28 Synergy DES) w/ R->L Collaterals, LCX nl, RCA 30p/d, EF 55% w/ sev distal inf/ant HK.   Depression    Diabetes mellitus without complication ([place])    Erectile dysfunction    H/O echocardiogram    a. 05/2014 Echo: Ef 45-50%, sev mid antsept/ant HK.   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     Pancreatitis 10/2018   Tobacco abuse     Past Surgical History:  Procedure Laterality Date   CORONARY STENT PLACEMENT  05/24/2014   MID LAD DES    LEFT HEART CATHETERIZATION WITH CORONARY ANGIOGRAM N/A 05/24/2014   Procedure: LEFT HEART CATHETERIZATION WITH CORONARY ANGIOGRAM;  Surgeon: Jettie Booze, MD;  Location: [facility] CATH LAB;  Service: Cardiovascular;  Laterality: N/A;   PERCUTANEOUS CORONARY STENT INTERVENTION (PCI-S)  05/24/2014   Procedure: PERCUTANEOUS CORONARY STENT INTERVENTION (PCI-S);  Surgeon: Jettie Booze, MD;  Location: [facility] CATH LAB;  Service: Cardiovascular;;   PILONIDAL CYST EXCISION  1995    Current Medications: Current Meds  Medication Sig   acetaminophen (TYLENOL) 325 MG tablet Take 2 tablets (650 mg total) by mouth every 4 (four) hours as needed for headache or mild pain.   ALPRAZolam (XANAX) 0.5 MG tablet TAKE ONE TABLET BY MOUTH DAILY AS NEEDED FOR ANXIETY (Patient taking differently: Take 0.5 mg by mouth daily as needed for anxiety.)   amLODipine (NORVASC) 5 MG tablet Take 1 tablet (5 mg total) by mouth  daily.   aspirin EC 81 MG EC tablet Take 1 tablet (81 mg total) by mouth daily.   atorvastatin (LIPITOR) 40 MG tablet TAKE ONE TABLET BY MOUTH DAILY AT 6PM   Blood Glucose Monitoring Suppl (ONETOUCH VERIO) w/Device KIT 1 kit by Does not apply route.   carvedilol (COREG) 25 MG tablet Take 1 tablet (25 mg total) by mouth 2 (two) times daily with a meal.   Continuous Blood Gluc Sensor (FREESTYLE LIBRE 2 SENSOR) MISC 1 Device by Does not apply route every 14 (fourteen) days.   doxycycline (VIBRA-TABS) 100 MG tablet Take 100 mg by mouth 2 (two) times daily.   emtricitabine-tenofovir (TRUVADA) 200-300 MG tablet Take 1 tablet by mouth daily.   glucose blood (ONETOUCH VERIO) test strip Use as instructed to check blood sugar once daily.   Insulin Glargine (BASAGLAR KWIKPEN) 100 UNIT/ML Inject 20 Units into the skin daily.   insulin lispro (HUMALOG KWIKPEN) 100 UNIT/ML KwikPen Inject 9-12 Units into the skin 3 (three) times daily with meals. And pen needles 4/day   lisinopril (ZESTRIL) 40 MG tablet Take 1 tablet (40 mg total) by mouth daily.   Multiple Vitamin (MULTIVITAMIN WITH MINERALS) TABS tablet Take 1 tablet by mouth daily.   OneTouch Delica Lancets 24M MISC 1 each by Does not apply route daily. Check blood sugar once daily   Current Facility-Administered Medications for the 07/20/21 encounter (Office Visit) with Elouise Munroe, MD  Medication   testosterone cypionate (DEPOTESTOSTERONE CYPIONATE) injection 200 mg     Allergies:   Gadolinium derivatives   Social History   Tobacco Use   Smoking status: Every Day    Packs/day: 1.00    Years: 23.00    Pack years: 23.00    Types: Cigarettes   Smokeless tobacco: Never   Tobacco comments:    currently smoking 0.5 ppd, declines patch  Vaping Use   Vaping Use: Never used  Substance Use Topics   Alcohol use: Yes    Alcohol/week: 24.0 standard drinks    Types: 24 Cans of beer per week    Comment: 1/5-3/4 Fifth, 4-5 times a week.  +Guilty,  cutting down; 2/4 CAGE questions.   Drug use: No     Family History: The patient's family history includes Colon cancer in his father; Diabetes in his father and maternal grandmother; Diabetes type II in his father; Heart disease (age of onset: 42) in his father; Hypertension in his maternal grandfather and paternal grandfather; Peripheral Artery Disease in his father; Thyroid disease in his mother. There is no history of Cancer, Stroke, Other, Pancreatic cancer, or Esophageal cancer.  ROS:   Please see the history of present illness.    All other systems reviewed and are negative.  EKGs/Labs/Other Studies Reviewed:    The following studies were reviewed today:  Echo 07/07/2021:  1. Left ventricular ejection fraction, by estimation, is 60 to 65%. The  left ventricle has normal function. The left ventricle has no regional  wall motion abnormalities. Left ventricular diastolic  parameters are  indeterminate.   2. Right ventricular systolic function is normal. The right ventricular  size is normal. Tricuspid regurgitation signal is inadequate for assessing  PA pressure.   3. The mitral valve is normal in structure. Trivial mitral valve  regurgitation. No evidence of mitral stenosis.   4. The aortic valve was not well visualized. Aortic valve regurgitation  is trivial. No aortic stenosis is present.   5. The inferior vena cava is normal in size with greater than 50%  respiratory variability, suggesting right atrial pressure of 3 mmHg.  CT Abdomen/Pelvis 07/07/2021: IMPRESSION: Cavernous transformation of the portal vein.   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 Some inflammatory changes noted in the region of the head of the pancreas consistent with mild focal pancreatitis.   Mild biliary ductal dilatation without definitive obstructive lesion.   No other focal abnormality is noted.   EKG:  EKG is personally reviewed. 07/20/2021: Sinus rhythm. Inferior  infarct. 06/03/2021: NSR, inferior infarct pattern. Poor R wave progression.   Imaging studies that I have independently reviewed today: n/a  Recent Labs: 07/07/2021: ALT 17 07/09/2021: BUN 5; Creatinine, Ser 0.52; Hemoglobin 15.1; Platelets 137; Potassium 3.8; Sodium 135   Recent Lipid Panel    Component Value Date/Time   CHOL 149 07/07/2021 0822   CHOL 178 05/30/2019 0935   TRIG 87 07/07/2021 0822   HDL 51 07/07/2021 0822   HDL 73 05/30/2019 0935   CHOLHDL 2.9 07/07/2021 0822   VLDL 17 07/07/2021 0822   LDLCALC 81 07/07/2021 0822   LDLCALC 92 05/30/2019 0935    Physical Exam:    VS:  BP 128/86   Pulse 77   Ht _0  (1.626 m)   Wt 134 lb 9.6 oz (61.1 kg)   SpO2 99%   BMI 23.10 kg/m     Wt Readings from Last 5 Encounters:  07/20/21 134 lb 9.6 oz (61.1 kg)  07/07/21 135 lb 8 oz (61.5 kg)  07/06/21 137 lb 12.8 oz (62.5 kg)  07/06/21 137 lb 6.4 oz (62.3 kg)  06/03/21 132 lb 3.2 oz (60 kg)    Constitutional: No acute distress Eyes: sclera non-icteric, normal conjunctiva and lids ENMT: normal dentition, moist mucous membranes Cardiovascular: regular rhythm, normal rate, no murmur. S1 and S2 normal. No jugular venous distention.  Respiratory: clear to auscultation bilaterally GI : normal bowel sounds, soft and nontender. No distention.   MSK: extremities warm, well perfused. No edema.  NEURO: grossly nonfocal exam, moves all extremities. PSYCH: alert and oriented x 3, normal mood and affect.   ASSESSMENT:    1. Coronary artery disease involving native coronary artery of native heart without angina pectoris   2. Hypertensive urgency   3. Primary hypertension   4. Dizziness   5. Hyperlipidemia, unspecified hyperlipidemia type   6. Tobacco abuse   7. Medication management   8. Daytime somnolence   9. Essential hypertension   10. Excessive daytime sleepiness     PLAN:    Coronary artery disease involving native coronary artery of native heart without angina  pectoris - continue ASA 81 mg daily. Continue metoprolol 50 mg BID, atorvastatin 40 mg daily.   Essential hypertension Dizziness HTN urgency - recent admission with HTN urgency and pancreatitis. - bp controlled today on new regimen of amlodipine 5 mg daily, lisinopril 40 mg daily, carvedilol 25 mg BID. Continue.    Hyperlipidemia, unspecified hyperlipidemia type -continue statin, lipids with reasonable control. Atorvastatin 40 mg dialy.    EXCESSIVE  DAYTIME SLEEPINESS - EPWORTH SCORE 13 - sleep study denied by insurance, however he feels much better and would like to defer sleep study at this time.     Follow-up:  6 months.  Total time of encounter: 30 minutes total time of encounter, including 20 minutes spent in face-to-face patient care on the date of this encounter. This time includes coordination of care and counseling regarding above mentioned problem list. Remainder of non-face-to-face time involved reviewing chart documents/testing relevant to the patient encounter and documentation in the medical record. I have independently reviewed documentation from referring provider.   Cherlynn Kaiser, MD, [place]   Shared Decision Making/Informed Consent:       Medication Adjustments/Labs and Tests Ordered: Current medicines are reviewed at length with the patient today.  Concerns regarding medicines are outlined above.   Orders Placed This Encounter  Procedures   EKG 12-Lead   No orders of the defined types were placed in this encounter.  Patient Instructions  Medication Instructions:  No Changes In Medications at this time.  *If you need a refill on your cardiac medications before your next appointment, please call your pharmacy*  Follow-Up: At [facility], you and your health needs are our priority.  As part of our continuing mission to provide you with exceptional heart care, we have created designated Provider Care Teams.  These Care Teams  include your primary Cardiologist (physician) and Advanced Practice Providers (APPs -  Physician Assistants and Nurse Practitioners) who all work together to provide you with the care you need, when you need it.  Your next appointment:   6 month(s)  The format for your next appointment:   In Person  Provider:   Elouise Munroe, MD        [facility] Stumpf,acting as a scribe for Elouise Munroe, MD.,have documented all relevant documentation on the behalf of Elouise Munroe, MD,as directed by  Elouise Munroe, MD while in the presence of Elouise Munroe, MD.  I, Elouise Munroe, MD, have reviewed all documentation for the visit on 07/20/2021. The documentation on today's date of service for the exam, diagnosis, procedures, and orders are all accurate and complete.

## 2021-07-20 NOTE — Patient Instructions (Signed)
Medication Instructions:  ?No Changes In Medications at this time.  ?*If you need a refill on your cardiac medications before your next appointment, please call your pharmacy* ? ?Follow-Up: ?At [facility], you and your health needs are our priority.  As part of our continuing mission to provide you with exceptional heart care, we have created designated Provider Care Teams.  These Care Teams include your primary Cardiologist (physician) and Advanced Practice Providers (APPs -  Physician Assistants and Nurse Practitioners) who all work together to provide you with the care you need, when you need it. ? ?Your next appointment:   ?6 month(s) ? ?The format for your next appointment:   ?In Person ? ?Provider:   ?Elouise Munroe, MD   ? ?  ?

## 2021-08-05 ENCOUNTER — Ambulatory Visit: Payer: Self-pay | Admitting: Internal Medicine

## 2021-08-14 ENCOUNTER — Encounter: Payer: Self-pay | Admitting: Endocrinology

## 2021-08-14 DIAGNOSIS — E119 Type 2 diabetes mellitus without complications: Secondary | ICD-10-CM

## 2021-08-14 MED ORDER — BASAGLAR KWIKPEN 100 UNIT/ML ~~LOC~~ SOPN: 20.0000 [IU] | PEN_INJECTOR | Freq: Every day | SUBCUTANEOUS | 0 refills | Status: DC

## 2021-08-25 ENCOUNTER — Ambulatory Visit: Payer: Managed Care, Other (non HMO) | Admitting: Endocrinology

## 2021-08-29 ENCOUNTER — Other Ambulatory Visit: Payer: Self-pay | Admitting: Internal Medicine

## 2021-10-28 ENCOUNTER — Other Ambulatory Visit: Payer: Self-pay

## 2021-10-28 ENCOUNTER — Emergency Department (HOSPITAL_COMMUNITY): Payer: Commercial Managed Care - HMO

## 2021-10-28 ENCOUNTER — Observation Stay (HOSPITAL_COMMUNITY)
Admission: EM | Admit: 2021-10-28 | Discharge: 2021-10-30 | Disposition: A | Discharge status: Home / Self Care | Payer: Commercial Managed Care - HMO | Attending: Internal Medicine | Admitting: Internal Medicine

## 2021-10-28 ENCOUNTER — Encounter (HOSPITAL_COMMUNITY): Payer: Self-pay

## 2021-10-28 DIAGNOSIS — Z955 Presence of coronary angioplasty implant and graft: Secondary | ICD-10-CM | POA: Insufficient documentation

## 2021-10-28 DIAGNOSIS — Z7982 Long term (current) use of aspirin: Secondary | ICD-10-CM | POA: Diagnosis not present

## 2021-10-28 DIAGNOSIS — G9341 Metabolic encephalopathy: Secondary | ICD-10-CM | POA: Diagnosis not present

## 2021-10-28 DIAGNOSIS — I2510 Atherosclerotic heart disease of native coronary artery without angina pectoris: Secondary | ICD-10-CM | POA: Diagnosis not present

## 2021-10-28 DIAGNOSIS — E119 Type 2 diabetes mellitus without complications: Secondary | ICD-10-CM

## 2021-10-28 DIAGNOSIS — I10 Essential (primary) hypertension: Secondary | ICD-10-CM | POA: Diagnosis not present

## 2021-10-28 DIAGNOSIS — E1110 Type 2 diabetes mellitus with ketoacidosis without coma: Principal | ICD-10-CM | POA: Diagnosis present

## 2021-10-28 DIAGNOSIS — R0602 Shortness of breath: Secondary | ICD-10-CM | POA: Diagnosis present

## 2021-10-28 DIAGNOSIS — Z794 Long term (current) use of insulin: Secondary | ICD-10-CM | POA: Diagnosis not present

## 2021-10-28 DIAGNOSIS — N179 Acute kidney failure, unspecified: Secondary | ICD-10-CM | POA: Diagnosis not present

## 2021-10-28 DIAGNOSIS — Z79899 Other long term (current) drug therapy: Secondary | ICD-10-CM | POA: Diagnosis not present

## 2021-10-28 DIAGNOSIS — F17210 Nicotine dependence, cigarettes, uncomplicated: Secondary | ICD-10-CM | POA: Insufficient documentation

## 2021-10-28 DIAGNOSIS — J189 Pneumonia, unspecified organism: Secondary | ICD-10-CM | POA: Diagnosis not present

## 2021-10-28 DIAGNOSIS — E871 Hypo-osmolality and hyponatremia: Secondary | ICD-10-CM | POA: Insufficient documentation

## 2021-10-28 DIAGNOSIS — E785 Hyperlipidemia, unspecified: Secondary | ICD-10-CM | POA: Diagnosis present

## 2021-10-28 LAB — TROPONIN I (HIGH SENSITIVITY)
Troponin I (High Sensitivity): 6 ng/L (ref <18)
Troponin I (High Sensitivity): 5 ng/L (ref <18)

## 2021-10-28 LAB — CBC WITH DIFFERENTIAL/PLATELET
Abs Immature Granulocytes: 0.07 10*3/uL (ref 0.00–0.07)
Basophils Absolute: 0 10*3/uL (ref 0.0–0.1)
Basophils Relative: 0 %
Eosinophils Absolute: 0 10*3/uL (ref 0.0–0.5)
Eosinophils Relative: 0 %
HCT: 45.5 % (ref 39.0–52.0)
Hemoglobin: 15.4 g/dL (ref 13.0–17.0)
Immature Granulocytes: 1 %
Lymphocytes Relative: 14 %
Lymphs Abs: 1.7 10*3/uL (ref 0.7–4.0)
MCH: 32.8 pg (ref 26.0–34.0)
MCHC: 33.8 g/dL (ref 30.0–36.0)
MCV: 97 fL (ref 80.0–100.0)
Monocytes Absolute: 1.6 10*3/uL — ABNORMAL HIGH (ref 0.1–1.0)
Monocytes Relative: 13 %
Neutro Abs: 8.8 10*3/uL — ABNORMAL HIGH (ref 1.7–7.7)
Neutrophils Relative %: 72 %
Platelets: 199 10*3/uL (ref 150–400)
RBC: 4.69 MIL/uL (ref 4.22–5.81)
RDW: 13.7 % (ref 11.5–15.5)
WBC: 12.2 10*3/uL — ABNORMAL HIGH (ref 4.0–10.5)
nRBC: 0 % (ref 0.0–0.2)

## 2021-10-28 LAB — COMPREHENSIVE METABOLIC PANEL
ALT: 17 U/L (ref 0–44)
AST: 20 U/L (ref 15–41)
Albumin: 3.5 g/dL (ref 3.5–5.0)
Alkaline Phosphatase: 94 U/L (ref 38–126)
Anion gap: 27 — ABNORMAL HIGH (ref 5–15)
BUN: 37 mg/dL — ABNORMAL HIGH (ref 6–20)
CO2: 7 mmol/L — ABNORMAL LOW (ref 22–32)
Calcium: 9.2 mg/dL (ref 8.9–10.3)
Chloride: 95 mmol/L — ABNORMAL LOW (ref 98–111)
Creatinine, Ser: 2.21 mg/dL — ABNORMAL HIGH (ref 0.61–1.24)
GFR, Estimated: 36 mL/min — ABNORMAL LOW (ref >60)
Glucose, Bld: 479 mg/dL — ABNORMAL HIGH (ref 70–99)
Potassium: 5.1 mmol/L (ref 3.5–5.1)
Sodium: 129 mmol/L — ABNORMAL LOW (ref 135–145)
Total Bilirubin: 2.2 mg/dL — ABNORMAL HIGH (ref 0.3–1.2)
Total Protein: 7.1 g/dL (ref 6.5–8.1)

## 2021-10-28 LAB — RAPID URINE DRUG SCREEN, HOSP PERFORMED
Amphetamines: NOT DETECTED
Barbiturates: NOT DETECTED
Benzodiazepines: POSITIVE — AB
Cocaine: NOT DETECTED
Opiates: NOT DETECTED
Tetrahydrocannabinol: NOT DETECTED

## 2021-10-28 LAB — CBG MONITORING, ED
Glucose-Capillary: 593 mg/dL (ref 70–99)
Glucose-Capillary: 595 mg/dL (ref 70–99)

## 2021-10-28 LAB — I-STAT VENOUS BLOOD GAS, ED
Acid-base deficit: 21 mmol/L — ABNORMAL HIGH (ref 0.0–2.0)
Bicarbonate: 5.2 mmol/L — ABNORMAL LOW (ref 20.0–28.0)
Calcium, Ion: 1.04 mmol/L — ABNORMAL LOW (ref 1.15–1.40)
HCT: 46 % (ref 39.0–52.0)
Hemoglobin: 15.6 g/dL (ref 13.0–17.0)
O2 Saturation: 100 %
Potassium: 6 mmol/L — ABNORMAL HIGH (ref 3.5–5.1)
Sodium: 124 mmol/L — ABNORMAL LOW (ref 135–145)
TCO2: 6 mmol/L — ABNORMAL LOW (ref 22–32)
pCO2, Ven: 15.2 mmHg — CL (ref 44–60)
pH, Ven: 7.142 — CL (ref 7.25–7.43)
pO2, Ven: 213 mmHg — ABNORMAL HIGH (ref 32–45)

## 2021-10-28 LAB — BRAIN NATRIURETIC PEPTIDE: B Natriuretic Peptide: 51 pg/mL (ref 0.0–100.0)

## 2021-10-28 LAB — ETHANOL: Alcohol, Ethyl (B): <10 mg/dL (ref <10)

## 2021-10-28 LAB — D-DIMER, QUANTITATIVE: D-Dimer, Quant: 0.93 ug/mL-FEU — ABNORMAL HIGH (ref 0.00–0.50)

## 2021-10-28 MED ORDER — SODIUM CHLORIDE 0.9 % IV SOLN: INTRAVENOUS | Status: DC

## 2021-10-28 MED ORDER — LACTATED RINGERS IV SOLN: INTRAVENOUS | Status: DC

## 2021-10-28 MED ORDER — DEXTROSE 50 % IV SOLN
0.0000 mL | INTRAVENOUS | Status: DC | PRN
  Filled 2021-10-28: qty 50

## 2021-10-28 MED ORDER — LACTATED RINGERS IV BOLUS
20.0000 mL/kg | Freq: Once | INTRAVENOUS | Status: AC
Start: 2021-10-28 — End: 2021-10-28
  Administered 2021-10-28: 1198 mL via INTRAVENOUS

## 2021-10-28 MED ORDER — IOHEXOL 350 MG/ML SOLN
40.0000 mL | Freq: Once | INTRAVENOUS | Status: AC | PRN
  Administered 2021-10-28: 40 mL via INTRAVENOUS

## 2021-10-28 MED ORDER — DEXTROSE IN LACTATED RINGERS 5 % IV SOLN: INTRAVENOUS | Status: DC

## 2021-10-28 MED ORDER — INSULIN REGULAR(HUMAN) IN NACL 100-0.9 UT/100ML-% IV SOLN
INTRAVENOUS | Status: DC
  Administered 2021-10-28: 9 [IU]/h via INTRAVENOUS
  Filled 2021-10-28: qty 100

## 2021-10-28 NOTE — ED Notes (Signed)
Patient transported to CT 

## 2021-10-28 NOTE — ED Triage Notes (Signed)
[facility] reports sob that started four days ago. Hx of MI 10 years ago. Also c/o back pain for the four days as well.

## 2021-10-28 NOTE — ED Provider Notes (Signed)
[facility] EMERGENCY DEPARTMENT Provider Note   CSN: [id number] Arrival date & time: 10/28/21  1833     History  Chief Complaint  Patient presents with   Shortness of Breath    [name] [name] is a 46 y.o. male.  46 year old male presents with shortness of breath times several days.  States that it has been constant in nature.  Notes it is not exertional.  Slight cough without fever or chills.  No black or bloody stools.  No vomiting or diarrhea.  Denies any chest pain or chest pressure.  No history of pulmonary disease.  Called EMS and transported here       Home Medications Prior to Admission medications   Medication Sig Start Date End Date Taking? Authorizing Provider  acetaminophen (TYLENOL) 325 MG tablet Take 2 tablets (650 mg total) by mouth every 4 (four) hours as needed for headache or mild pain. 07/09/21   Regalado, Belkys A, MD  ALPRAZolam (XANAX) 0.5 MG tablet TAKE ONE TABLET BY MOUTH DAILY AS NEEDED FOR ANXIETY Patient taking differently: Take 0.5 mg by mouth daily as needed for anxiety. 07/06/21   Denita Lung, MD  amLODipine (NORVASC) 5 MG tablet Take 1 tablet (5 mg total) by mouth daily. 07/10/21   Regalado, Jerald Kief A, MD  aspirin EC 81 MG EC tablet Take 1 tablet (81 mg total) by mouth daily. 05/25/14   Theora Gianotti, NP  atorvastatin (LIPITOR) 40 MG tablet TAKE ONE TABLET BY MOUTH DAILY AT [facility] 06/03/21   Elouise Munroe, MD  Blood Glucose Monitoring Suppl (ONETOUCH VERIO) w/Device KIT 1 kit by Does not apply route.    [provider]  carvedilol (COREG) 25 MG tablet Take 1 tablet (25 mg total) by mouth 2 (two) times daily with a meal. 07/09/21   Regalado, Belkys A, MD  Continuous Blood Gluc Sensor (FREESTYLE LIBRE 2 SENSOR) MISC 1 Device by Does not apply route every 14 (fourteen) days. 05/26/21   Renato Shin, MD  doxycycline (VIBRA-TABS) 100 MG tablet Take 100 mg by mouth 2 (two) times daily.    [provider]   emtricitabine-tenofovir (TRUVADA) 200-300 MG tablet Take 1 tablet by mouth daily. 11/13/19   [provider]  glucose blood (ONETOUCH VERIO) test strip Use as instructed to check blood sugar once daily. 07/10/20   Denita Lung, MD  Insulin Glargine [facility] KWIKPEN) 100 UNIT/ML Inject 20 Units into the skin daily. 08/14/21   Elayne Snare, MD  insulin lispro (HUMALOG KWIKPEN) 100 UNIT/ML KwikPen Inject 9-12 Units into the skin 3 (three) times daily with meals. And pen needles 4/day 07/06/21   Renato Shin, MD  lisinopril (ZESTRIL) 40 MG tablet Take 1 tablet (40 mg total) by mouth daily. 07/10/21   Regalado, Belkys A, MD  Multiple Vitamin (MULTIVITAMIN WITH MINERALS) TABS tablet Take 1 tablet by mouth daily.    [provider]  OneTouch Delica Lancets 28Z MISC 1 each by Does not apply route daily. Check blood sugar once daily 07/10/20   Denita Lung, MD      Allergies    Gadolinium derivatives    Review of Systems   Review of Systems  All other systems reviewed and are negative.   Physical Exam Updated Vital Signs Ht 1.626 m ('5\' 4"' )   Wt 59.9 kg   SpO2 95%   BMI 22.66 kg/m  Physical Exam Vitals and nursing note reviewed.  Constitutional:      General: He is not  in acute distress.    Appearance: Normal appearance. He is well-developed. He is not toxic-appearing.  HENT:     Head: Normocephalic and atraumatic.  Eyes:     General: Lids are normal.     Conjunctiva/sclera: Conjunctivae normal.     Pupils: Pupils are equal, round, and reactive to light.  Neck:     Thyroid: No thyroid mass.     Trachea: No tracheal deviation.  Cardiovascular:     Rate and Rhythm: Normal rate and regular rhythm.     Heart sounds: Normal heart sounds. No murmur heard.    No gallop.  Pulmonary:     Effort: Pulmonary effort is normal. No respiratory distress.     Breath sounds: Normal breath sounds. No stridor. No decreased breath sounds, wheezing, rhonchi or rales.  Abdominal:      General: There is no distension.     Palpations: Abdomen is soft.     Tenderness: There is no abdominal tenderness. There is no rebound.  Musculoskeletal:        General: No tenderness. Normal range of motion.     Cervical back: Normal range of motion and neck supple.  Skin:    General: Skin is warm and dry.     Findings: No abrasion or rash.  Neurological:     Mental Status: He is alert and oriented to person, place, and time. Mental status is at baseline.     GCS: GCS eye subscore is 4. GCS verbal subscore is 5. GCS motor subscore is 6.     Cranial Nerves: No cranial nerve deficit.     Sensory: No sensory deficit.     Motor: Motor function is intact.  Psychiatric:        Attention and Perception: Attention normal.        Speech: Speech normal.        Behavior: Behavior normal.     ED Results / Procedures / Treatments   Labs (all labs ordered are listed, but only abnormal results are displayed) Labs Reviewed  CBC WITH DIFFERENTIAL/PLATELET  BRAIN NATRIURETIC PEPTIDE  COMPREHENSIVE METABOLIC PANEL  D-DIMER, QUANTITATIVE  TROPONIN I (HIGH SENSITIVITY)    EKG EKG Interpretation  Date/Time:  Wednesday October 28 2021 19:13:27 EDT Ventricular Rate:  97 PR Interval:  130 QRS Duration: 89 QT Interval:  363 QTC Calculation: 462 R Axis:   -25 Text Interpretation: Sinus rhythm Probable left atrial enlargement Borderline left axis deviation Confirmed by Lacretia Leigh ([id number]) on 10/28/2021 8:03:07 PM  Radiology No results found.  Procedures Procedures    Medications Ordered in ED Medications  0.9 %  sodium chloride infusion (has no administration in time range)    ED Course/ Medical Decision Making/ A&P                           Medical Decision Making Amount and/or Complexity of Data Reviewed Labs: ordered. Radiology: ordered. ECG/medicine tests: ordered.  Risk Prescription drug management.   Patient's EKG per interpretation shows no evidence of acute  ischemia.  Patient is D-dimer elevated and CT angio chest rule out PE was ordered initially but due to patient's acute kidney injury this will be held off.  Patient has evidence of diabetic ketoacidosis with hyperglycemia of 479 as well as an anion gap of 27.  He will be given IV fluids here and started on insulin drip.  Patient requires inpatient hospitalization  CRITICAL CARE Performed by: Leota Jacobsen  Total critical care time: 50 minutes Critical care time was exclusive of separately billable procedures and treating other patients. Critical care was necessary to treat or prevent imminent or life-threatening deterioration. Critical care was time spent personally by me on the following activities: development of treatment plan with patient and/or surrogate as well as nursing, discussions with consultants, evaluation of patient's response to treatment, examination of patient, obtaining history from patient or surrogate, ordering and performing treatments and interventions, ordering and review of laboratory studies, ordering and review of radiographic studies, pulse oximetry and re-evaluation of patient's condition.         Final Clinical Impression(s) / ED Diagnoses Final diagnoses:  None    Rx / DC Orders ED Discharge Orders     None         Lacretia Leigh, MD 10/28/21 2219

## 2021-10-29 DIAGNOSIS — G9341 Metabolic encephalopathy: Secondary | ICD-10-CM

## 2021-10-29 DIAGNOSIS — J189 Pneumonia, unspecified organism: Secondary | ICD-10-CM

## 2021-10-29 DIAGNOSIS — N179 Acute kidney failure, unspecified: Secondary | ICD-10-CM | POA: Diagnosis not present

## 2021-10-29 DIAGNOSIS — E1110 Type 2 diabetes mellitus with ketoacidosis without coma: Secondary | ICD-10-CM | POA: Diagnosis not present

## 2021-10-29 LAB — URINALYSIS, ROUTINE W REFLEX MICROSCOPIC
Bacteria, UA: NONE SEEN
Bilirubin Urine: NEGATIVE
Glucose, UA: >=500 mg/dL — AB
Hgb urine dipstick: NEGATIVE
Ketones, ur: 80 mg/dL — AB
Leukocytes,Ua: NEGATIVE
Nitrite: NEGATIVE
Protein, ur: NEGATIVE mg/dL
Specific Gravity, Urine: 1.023 (ref 1.005–1.030)
pH: 5 (ref 5.0–8.0)

## 2021-10-29 LAB — BASIC METABOLIC PANEL
Anion gap: 16 — ABNORMAL HIGH (ref 5–15)
Anion gap: 8 (ref 5–15)
BUN: 30 mg/dL — ABNORMAL HIGH (ref 6–20)
BUN: 24 mg/dL — ABNORMAL HIGH (ref 6–20)
CO2: 12 mmol/L — ABNORMAL LOW (ref 22–32)
CO2: 19 mmol/L — ABNORMAL LOW (ref 22–32)
Calcium: 8.5 mg/dL — ABNORMAL LOW (ref 8.9–10.3)
Calcium: 8.5 mg/dL — ABNORMAL LOW (ref 8.9–10.3)
Chloride: 102 mmol/L (ref 98–111)
Chloride: 103 mmol/L (ref 98–111)
Creatinine, Ser: 1.62 mg/dL — ABNORMAL HIGH (ref 0.61–1.24)
Creatinine, Ser: 1.09 mg/dL (ref 0.61–1.24)
GFR, Estimated: 52 mL/min — ABNORMAL LOW (ref >60)
GFR, Estimated: >60 mL/min (ref >60)
Glucose, Bld: 368 mg/dL — ABNORMAL HIGH (ref 70–99)
Glucose, Bld: 220 mg/dL — ABNORMAL HIGH (ref 70–99)
Potassium: 4.1 mmol/L (ref 3.5–5.1)
Potassium: 3.6 mmol/L (ref 3.5–5.1)
Sodium: 130 mmol/L — ABNORMAL LOW (ref 135–145)
Sodium: 130 mmol/L — ABNORMAL LOW (ref 135–145)

## 2021-10-29 LAB — GLUCOSE, CAPILLARY
Glucose-Capillary: 152 mg/dL — ABNORMAL HIGH (ref 70–99)
Glucose-Capillary: 146 mg/dL — ABNORMAL HIGH (ref 70–99)
Glucose-Capillary: 230 mg/dL — ABNORMAL HIGH (ref 70–99)
Glucose-Capillary: 281 mg/dL — ABNORMAL HIGH (ref 70–99)

## 2021-10-29 LAB — CBG MONITORING, ED
Glucose-Capillary: 193 mg/dL — ABNORMAL HIGH (ref 70–99)
Glucose-Capillary: 211 mg/dL — ABNORMAL HIGH (ref 70–99)
Glucose-Capillary: 213 mg/dL — ABNORMAL HIGH (ref 70–99)
Glucose-Capillary: 213 mg/dL — ABNORMAL HIGH (ref 70–99)
Glucose-Capillary: 222 mg/dL — ABNORMAL HIGH (ref 70–99)
Glucose-Capillary: 272 mg/dL — ABNORMAL HIGH (ref 70–99)
Glucose-Capillary: 331 mg/dL — ABNORMAL HIGH (ref 70–99)
Glucose-Capillary: 383 mg/dL — ABNORMAL HIGH (ref 70–99)
Glucose-Capillary: 406 mg/dL — ABNORMAL HIGH (ref 70–99)
Glucose-Capillary: 440 mg/dL — ABNORMAL HIGH (ref 70–99)
Glucose-Capillary: 504 mg/dL (ref 70–99)
Glucose-Capillary: 550 mg/dL (ref 70–99)

## 2021-10-29 LAB — BETA-HYDROXYBUTYRIC ACID
Beta-Hydroxybutyric Acid: >8 mmol/L — ABNORMAL HIGH (ref 0.05–0.27)
Beta-Hydroxybutyric Acid: 0.68 mmol/L — ABNORMAL HIGH (ref 0.05–0.27)

## 2021-10-29 MED ORDER — SODIUM CHLORIDE 0.9 % IV SOLN
500.0000 mg | INTRAVENOUS | Status: DC
  Administered 2021-10-29 – 2021-10-30 (×2): 500 mg via INTRAVENOUS
  Filled 2021-10-29 (×2): qty 5

## 2021-10-29 MED ORDER — LACTATED RINGERS IV BOLUS
1000.0000 mL | Freq: Once | INTRAVENOUS | Status: AC
  Administered 2021-10-29: 1000 mL via INTRAVENOUS

## 2021-10-29 MED ORDER — SODIUM CHLORIDE 0.9 % IV SOLN
1.0000 g | INTRAVENOUS | Status: DC
  Administered 2021-10-29 – 2021-10-30 (×2): 1 g via INTRAVENOUS
  Filled 2021-10-29 (×2): qty 10

## 2021-10-29 MED ORDER — ACETAMINOPHEN 325 MG PO TABS
650.0000 mg | ORAL_TABLET | ORAL | Status: DC | PRN
  Administered 2021-10-29: 650 mg via ORAL
  Filled 2021-10-29: qty 2

## 2021-10-29 MED ORDER — ALPRAZOLAM 0.25 MG PO TABS: 0.2500 mg | ORAL_TABLET | Freq: Three times a day (TID) | ORAL | Status: DC | PRN

## 2021-10-29 MED ORDER — INSULIN GLARGINE-YFGN 100 UNIT/ML ~~LOC~~ SOLN
20.0000 [IU] | Freq: Every day | SUBCUTANEOUS | Status: DC
  Administered 2021-10-29: 20 [IU] via SUBCUTANEOUS
  Filled 2021-10-29 (×2): qty 0.2

## 2021-10-29 MED ORDER — ENOXAPARIN SODIUM 40 MG/0.4ML IJ SOSY
40.0000 mg | PREFILLED_SYRINGE | INTRAMUSCULAR | Status: DC
  Administered 2021-10-29: 40 mg via SUBCUTANEOUS
  Filled 2021-10-29 (×2): qty 0.4

## 2021-10-29 MED ORDER — ATORVASTATIN CALCIUM 40 MG PO TABS
40.0000 mg | ORAL_TABLET | Freq: Every day | ORAL | Status: DC
  Administered 2021-10-29 – 2021-10-30 (×2): 40 mg via ORAL
  Filled 2021-10-29 (×2): qty 1

## 2021-10-29 MED ORDER — ALUM & MAG HYDROXIDE-SIMETH 200-200-20 MG/5ML PO SUSP
30.0000 mL | Freq: Four times a day (QID) | ORAL | Status: DC | PRN
  Administered 2021-10-29: 30 mL via ORAL
  Filled 2021-10-29: qty 30

## 2021-10-29 MED ORDER — ASPIRIN 81 MG PO TBEC
81.0000 mg | DELAYED_RELEASE_TABLET | Freq: Every day | ORAL | Status: DC
Start: 2021-10-29 — End: 2021-10-30
  Administered 2021-10-29 – 2021-10-30 (×2): 81 mg via ORAL
  Filled 2021-10-29 (×2): qty 1

## 2021-10-29 MED ORDER — INSULIN ASPART 100 UNIT/ML IJ SOLN
0.0000 [IU] | Freq: Three times a day (TID) | INTRAMUSCULAR | Status: DC
  Administered 2021-10-29: 3 [IU] via SUBCUTANEOUS
  Administered 2021-10-29: 1 [IU] via SUBCUTANEOUS
  Administered 2021-10-30: 5 [IU] via SUBCUTANEOUS

## 2021-10-29 NOTE — Progress Notes (Signed)
PROGRESS NOTE        PATIENT DETAILS Name: [name] [name] Age: 46 y.o. Sex: male Date of Birth: [birth year] Admit Date: 10/28/2021 Admitting Physician Anselm Jungling, DO RCB:ULAGTXM, Everardo All, MD  Brief Summary: Patient is a 46 y.o.  male with history of DM-2, CAD, HTN who presented with several days history of weakness, nausea/vomiting-found to have DKA/PNA and subsequently admitted to the hospitalist service.  Significant events: 8/23>> admit to [facility]-DKA/PNA.  Significant studies: 8/23>> CT angio chest:No PE, bilateral groundglass opacities.  Significant microbiology data: None none  Procedures: None  Consults: None  Subjective: No nausea/vomiting-still feels weak.  Occasional coughing.  Objective: Vitals: Blood pressure 118/76, pulse 93, temperature 97.6 F (36.4 C), temperature source Oral, resp. rate 17, height 5\' 4"  (1.626 m), weight 59.9 kg, SpO2 100 %.   Exam: Gen Exam:Alert awake-not in any distress HEENT:atraumatic, normocephalic Chest: B/L clear to auscultation anteriorly CVS:S1S2 regular Abdomen:soft non tender, non distended Extremities:no edema Neurology: Non focal Skin: no rash  Pertinent Labs/Radiology:    Latest Ref Rng & Units 10/28/2021   11:27 PM 10/28/2021    6:45 PM 07/09/2021    4:52 AM  CBC  WBC 4.0 - 10.5 K/uL  12.2  6.6   Hemoglobin 13.0 - 17.0 g/dL 09/08/2021  46.8  03.2   Hematocrit 39.0 - 52.0 % 46.0  45.5  45.5   Platelets 150 - 400 K/uL  199  137     Lab Results  Component Value Date   NA 130 (L) 10/29/2021   K 3.6 10/29/2021   CL 103 10/29/2021   CO2 19 (L) 10/29/2021      Assessment/Plan: DKA: Resolved-stop insulin infusion-transition to SQ insulin.  Suspect DKA provoked by GI illness-and patient stopping SQ insulin.   DM-2 (A1c 8.8 on 5/1): Being transitioned to SQ insulin-follow CBGs.  Recent Labs    10/29/21 0723 10/29/21 0829 10/29/21 0931  GLUCAP 222* 213* 193*   CAP: Nontoxic-appearing-some  cough-could have aspirated-continue Rocephin/Zithromax.  Acute metabolic encephalopathy: In the setting of DKA/AKI.  Mentation better-awake/alert but lethargic.  Mobilize with nursing staff and see how he does.  AKI: In the setting of DKA-resolved.  Hyponatremia: Mild-continue to monitor closely.  History of CAD-s/p PCI to LAD in 2016: No anginal symptoms.  Continue aspirin/statin-resume beta-blocker when able.  HTN: BP stable-continue to hold all antihypertensives.  HLD: Continue statin.  BMI: Estimated body mass index is 22.66 kg/m as calculated from the following:   Height as of this encounter: 5\' 4"  (1.626 m).   Weight as of this encounter: 59.9 kg.   Code status:   Code Status: Prior   DVT Prophylaxis:   Family Communication: None at bedside   Disposition Plan: Status is: Observation The patient will require care spanning > 2 midnights and should be moved to inpatient because: DKA/AKI/PNA with generalized weakness-suspect needs another day or so of inpatient monitoring to ensure stable ready for discharge.   Planned Discharge Destination:Home likely 8/25.   Diet: Diet Order             Diet heart healthy/carb modified Room service appropriate? Yes; Fluid consistency: Thin  Diet effective now                     Antimicrobial agents: Anti-infectives (From admission, onward)    Start  Dose/Rate Route Frequency Ordered Stop   10/29/21 0100  cefTRIAXone (ROCEPHIN) 1 g in sodium chloride 0.9 % 100 mL IVPB        1 g 200 mL/hr over 30 Minutes Intravenous Every 24 hours 10/29/21 0053     10/29/21 0100  azithromycin (ZITHROMAX) 500 mg in sodium chloride 0.9 % 250 mL IVPB        500 mg 250 mL/hr over 60 Minutes Intravenous Every 24 hours 10/29/21 0053          MEDICATIONS: Scheduled Meds:  insulin aspart  0-9 Units Subcutaneous TID WC   insulin glargine-yfgn  20 Units Subcutaneous Daily   Continuous Infusions:  azithromycin Stopped (10/29/21 0224)    cefTRIAXone (ROCEPHIN)  IV Stopped (10/29/21 0200)   dextrose 5% lactated ringers 125 mL/hr at 10/29/21 0934   insulin 6 Units/hr (10/29/21 0934)   lactated ringers Stopped (10/29/21 0530)   PRN Meds:.acetaminophen, alum & mag hydroxide-simeth, dextrose   I have personally reviewed following labs and imaging studies  LABORATORY DATA: CBC: Recent Labs  Lab 10/28/21 1845 10/28/21 2327  WBC 12.2*  --   NEUTROABS 8.8*  --   HGB 15.4 15.6  HCT 45.5 46.0  MCV 97.0  --   PLT 199  --     Basic Metabolic Panel: Recent Labs  Lab 10/28/21 1845 10/28/21 2327 10/29/21 0236 10/29/21 0600  NA 129* 124* 130* 130*  K 5.1 6.0* 4.1 3.6  CL 95*  --  102 103  CO2 7*  --  12* 19*  GLUCOSE 479*  --  368* 220*  BUN 37*  --  30* 24*  CREATININE 2.21*  --  1.62* 1.09  CALCIUM 9.2  --  8.5* 8.5*    GFR: Estimated Creatinine Clearance: 69.4 mL/min (by C-G formula based on SCr of 1.09 mg/dL).  Liver Function Tests: Recent Labs  Lab 10/28/21 1845  AST 20  ALT 17  ALKPHOS 94  BILITOT 2.2*  PROT 7.1  ALBUMIN 3.5   No results for input(s): "LIPASE", "AMYLASE" in the last 168 hours. No results for input(s): "AMMONIA" in the last 168 hours.  Coagulation Profile: No results for input(s): "INR", "PROTIME" in the last 168 hours.  Cardiac Enzymes: No results for input(s): "CKTOTAL", "CKMB", "CKMBINDEX", "TROPONINI" in the last 168 hours.  BNP (last 3 results) No results for input(s): "PROBNP" in the last 8760 hours.  Lipid Profile: No results for input(s): "CHOL", "HDL", "LDLCALC", "TRIG", "CHOLHDL", "LDLDIRECT" in the last 72 hours.  Thyroid Function Tests: No results for input(s): "TSH", "T4TOTAL", "FREET4", "T3FREE", "THYROIDAB" in the last 72 hours.  Anemia Panel: No results for input(s): "VITAMINB12", "FOLATE", "FERRITIN", "TIBC", "IRON", "RETICCTPCT" in the last 72 hours.  Urine analysis:    Component Value Date/Time   COLORURINE YELLOW 10/28/2021 2324   APPEARANCEUR  CLEAR 10/28/2021 2324   LABSPEC 1.023 10/28/2021 2324   PHURINE 5.0 10/28/2021 2324   GLUCOSEU >=500 (A) 10/28/2021 2324   HGBUR NEGATIVE 10/28/2021 2324   BILIRUBINUR NEGATIVE 10/28/2021 2324   BILIRUBINUR moderate (A) 10/27/2017 1654   BILIRUBINUR NEG 05/08/2014 1011   KETONESUR 80 (A) 10/28/2021 2324   PROTEINUR NEGATIVE 10/28/2021 2324   UROBILINOGEN 0.2 12/07/2019 1720   NITRITE NEGATIVE 10/28/2021 2324   LEUKOCYTESUR NEGATIVE 10/28/2021 2324    Sepsis Labs: Lactic Acid, Venous No results found for: "LATICACIDVEN"  MICROBIOLOGY: No results found for this or any previous visit (from the past 240 hour(s)).  RADIOLOGY STUDIES/RESULTS: CT Angio Chest PE W/Cm &/Or  Wo Cm  Result Date: 10/28/2021 CLINICAL DATA:  Shortness of breath EXAM: CT ANGIOGRAPHY CHEST WITH CONTRAST TECHNIQUE: Multidetector CT imaging of the chest was performed using the standard protocol during bolus administration of intravenous contrast. Multiplanar CT image reconstructions and MIPs were obtained to evaluate the vascular anatomy. RADIATION DOSE REDUCTION: This exam was performed according to the departmental dose-optimization program which includes automated exposure control, adjustment of the mA and/or kV according to patient size and/or use of iterative reconstruction technique. CONTRAST:  26mL OMNIPAQUE IOHEXOL 350 MG/ML SOLN COMPARISON:  Chest x-ray 10/28/2021 FINDINGS: Cardiovascular: Satisfactory opacification of the pulmonary arteries to the segmental level. No evidence of pulmonary embolism. Normal heart size. No pericardial effusion. Mild motion degradation limits assessment of distal PE in the lower lobes. Aorta is nonaneurysmal. Mild atherosclerosis. Coronary vascular calcification Mediastinum/Nodes: Midline trachea. No thyroid mass. No suspicious lymph nodes. Esophagus within normal limits. Lungs/Pleura: No pleural effusion or pneumothorax. Heterogeneous bilateral foci of ground-glass density in the  bilateral lungs. Upper Abdomen: No acute abnormality. Musculoskeletal: No chest wall abnormality. No acute or significant osseous findings. Review of the MIP images confirms the above findings. IMPRESSION: 1. Negative for acute pulmonary embolus. 2. Mild heterogeneous bilateral ground-glass densities suggestive of respiratory infection/pneumonia. Aortic Atherosclerosis (ICD10-I70.0). Electronically Signed   By: Jasmine Pang M.D.   On: 10/28/2021 22:42   DG Chest Port 1 View  Result Date: 10/28/2021 CLINICAL DATA:  Shortness of breath EXAM: PORTABLE CHEST 1 VIEW COMPARISON:  07/07/2021 FINDINGS: The heart size and mediastinal contours are within normal limits. Both lungs are clear. The visualized skeletal structures are unremarkable. IMPRESSION: Negative. Electronically Signed   By: Charlett Nose M.D.   On: 10/28/2021 19:49     LOS: 0 days   Jeoffrey Massed, MD  [facility]    To contact the attending provider between 7A-7P or the covering provider during after hours 7P-7A, please log into the web site [email or web address] and access using universal [place] password for that web site. If you do not have the password, please call the hospital operator.  10/29/2021, 10:37 AM

## 2021-10-29 NOTE — Assessment & Plan Note (Signed)
Resume statin once DKA improves and is able to come off of insulin infusion

## 2021-10-29 NOTE — ED Notes (Signed)
Admitting provider notified about anion gap closed and CBG 211, awaiting Beta-hydroxybutyric acid to determine whether to d/c insulin drip.

## 2021-10-29 NOTE — Assessment & Plan Note (Signed)
Controlled.  Hold antihypertensives for now with AKI and lethargy from DKA

## 2021-10-29 NOTE — ED Notes (Signed)
Admitting provider notified of pt CBG <250 x 2hr, anion gap 8, and beta 0.68.

## 2021-10-29 NOTE — Assessment & Plan Note (Signed)
Presented with pH of 7.14, bicarb of 6 with anion gap of 27. - replete potassium as needed - continue insulin gtt with goal of 140-180 and AG <12 - IV NS until BG <250, then switch to D5 LR - BMP q4hr  - keep NPO

## 2021-10-29 NOTE — Assessment & Plan Note (Signed)
Creatinine elevated at 2.21 up from 0.52. - Continue to monitor with IV fluid and follow creatinine in the morning

## 2021-10-29 NOTE — ED Notes (Signed)
CHECKED BLOODSUGAR 211MG  REPOETED TO NURSE

## 2021-10-29 NOTE — Progress Notes (Signed)
Endotool discontinued 

## 2021-10-29 NOTE — ED Notes (Signed)
Semglee given @ 0900, will d/c insulin gtt @ 1100 per Ghimire MD

## 2021-10-29 NOTE — Evaluation (Signed)
Physical Therapy Evaluation Patient Details Name: [name] [name] MRN: [id number] DOB: 22-Feb-1976 Today's Date: 10/29/2021  History of Present Illness  46 y.o. male presents to [facility] hospital on 10/28/2021 with weakness, nausea and vomiting, found to be in DKA and with PNA. PMH includes DMII, CAD, HTN.  Clinical Impression  Pt presents to PT with deficits in gait and balance. Pt with increased lateral drift, with one rightward loss of balance during session. Currently pt is reliant on UE support of RW to maintain stability, however PT anticipates if the pt is able to frequently mobilize he will likely progress to not requiring a device at the time of discharge. PT recommends discharge home when medically ready.       Recommendations for follow up therapy are one component of a multi-disciplinary discharge planning process, led by the attending physician.  Recommendations may be updated based on patient status, additional functional criteria and insurance authorization.  Follow Up Recommendations No PT follow up      Assistance Recommended at Discharge None  Patient can return home with the following       Equipment Recommendations  (anticipate no needs at time of discharge)  Recommendations for Other Services       Functional Status Assessment Patient has had a recent decline in their functional status and demonstrates the ability to make significant improvements in function in a reasonable and predictable amount of time.     Precautions / Restrictions Precautions Precautions: Fall Restrictions Weight Bearing Restrictions: No      Mobility  Bed Mobility Overal bed mobility: Modified Independent                  Transfers Overall transfer level: Needs assistance Equipment used: None Transfers: Sit to/from Stand Sit to Stand: Supervision                Ambulation/Gait Ambulation/Gait assistance: Min guard Gait Distance (Feet): 300 Feet Assistive device: Rolling  walker (2 wheels) Gait Pattern/deviations: Step-through pattern, Drifts right/left, Staggering right Gait velocity: functional Gait velocity interpretation: 1.31 - 2.62 ft/sec, indicative of limited community ambulator   General Gait Details: pt with increased lateral sway, one rightward loss of balance corrected with UE support and stepping strategy  Stairs            Wheelchair Mobility    Modified Rankin (Stroke Patients Only)       Balance Overall balance assessment: Needs assistance Sitting-balance support: No upper extremity supported, Feet supported Sitting balance-Leahy Scale: Normal     Standing balance support: No upper extremity supported, During functional activity Standing balance-Leahy Scale: Poor Standing balance comment: minA intermittently                             Pertinent Vitals/Pain Pain Assessment Pain Assessment: No/denies pain    Home Living Family/patient expects to be discharged to:: Private residence Living Arrangements: Alone Available Help at Discharge:  (none identified) Type of Home: Apartment Home Access: Stairs to enter Entrance Stairs-Rails: Right Entrance Stairs-Number of Steps: 5   Home Layout: One level Home Equipment: None      Prior Function Prior Level of Function : Independent/Modified Independent;Driving;Working/employed             Mobility Comments: working doing Copywriter, 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        Extremity/Trunk Assessment   Upper Extremity Assessment Upper Extremity Assessment:  Overall [facility] for tasks assessed    Lower Extremity Assessment Lower Extremity Assessment: Overall WFL for tasks assessed    Cervical / Trunk Assessment Cervical / Trunk Assessment: Normal  Communication   Communication: No difficulties  Cognition Arousal/Alertness: Awake/alert Behavior During Therapy: WFL for tasks assessed/performed Overall Cognitive Status: Within Functional  Limits for tasks assessed                                          General Comments General comments (skin integrity, edema, etc.): VSS on RA    Exercises     Assessment/Plan    PT Assessment Patient needs continued PT services  PT Problem List Decreased balance       PT Treatment Interventions DME instruction;Gait training;Stair training;Balance training;Neuromuscular re-education;Patient/family education    PT Goals (Current goals can be found in the Care Plan section)  Acute Rehab PT Goals Patient Stated Goal: to return to independence PT Goal Formulation: With patient Time For Goal Achievement: 11/12/21 Potential to Achieve Goals: Good Additional Goals Additional Goal #1: Pt will score >19/24 on the DGI to indicate a reduced risk for falls    Frequency Min 3X/week     Co-evaluation               AM-PAC PT "6 Clicks" Mobility  Outcome Measure Help needed turning from your back to your side while in a flat bed without using bedrails?: None Help needed moving from lying on your back to sitting on the side of a flat bed without using bedrails?: None Help needed moving to and from a bed to a chair (including a wheelchair)?: A Little Help needed standing up from a chair using your arms (e.g., wheelchair or bedside chair)?: A Little Help needed to walk in hospital room?: A Little Help needed climbing 3-5 steps with a railing? : A Little 6 Click Score: 20    End of Session   Activity Tolerance: Patient tolerated treatment well Patient left: in bed;with call bell/phone within reach;with bed alarm set Nurse Communication: Mobility status PT Visit Diagnosis: Unsteadiness on feet (R26.81)    Time: 3007-6226 PT Time Calculation (min) (ACUTE ONLY): 11 min   Charges:   PT Evaluation $PT Eval Low Complexity: 1 Low          Arlyss Gandy, PT, DPT Acute Rehabilitation Office [phone]   Arlyss Gandy 10/29/2021, 4:21 PM

## 2021-10-29 NOTE — H&P (Signed)
History and Physical    Patient: [name] [name] MQK:[id number] DOB: 04/30/1975 DOA: 10/28/2021 DOS: the patient was seen and examined on 10/29/2021 PCP: Denita Lung, MD  Patient coming from: Home  Chief Complaint:  Chief Complaint  Patient presents with   Shortness of Breath   HPI: [name] [name] is a 46 y.o. male with medical history significant of insulin-dependent type 2 diabetes, CAD s/p stent, hypertension who presents with nausea and vomiting.  Patient provides limited history as he is lethargic with altered mental status.  He reports nausea and vomiting for the past 4 to 5 days with abdominal pain secondary to his vomiting.  Has stopped taking his insulin since then.  In the ED, he was afebrile, mildly tachycardic and tachypneic but normotensive room air.  Had leukocytosis of 12.2. VBG with pH of 7.14, bicarb of 6. Pseudohyponatremia 124, potassium of 6, glucose of 479, anion gap of 27.  AKI with creatinine of 2.21 from 0.52.  D-dimer was mildly elevated at 0.93 and a CTA chest was obtained which was negative for PE but showed possible bilateral pneumonia.  UDS positive for benzo.  EKG on my review with normal sinus rhythm.  He was started on IV insulin infusion and given fluid bolus in the ED.  Hospitalist on-call for admission. Review of Systems: unable to review all systems due to the inability of the patient to answer questions. Past Medical History:  Diagnosis Date   Anxiety    Coronary artery disease    a. 05/2014 OOH MI/PCI: LM nl, LAD 40p, 99p (3.0x28 Synergy DES) w/ R->L Collaterals, LCX nl, RCA 30p/d, EF 55% w/ sev distal inf/ant HK.   Depression    Diabetes mellitus without complication ([place])    Erectile dysfunction    H/O echocardiogram    a. 05/2014 Echo: Ef 45-50%, sev mid antsept/ant HK.   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     Pancreatitis 10/2018    Tobacco abuse    Past Surgical History:  Procedure Laterality Date   CORONARY STENT PLACEMENT  05/24/2014   MID LAD DES    LEFT HEART CATHETERIZATION WITH CORONARY ANGIOGRAM N/A 05/24/2014   Procedure: LEFT HEART CATHETERIZATION WITH CORONARY ANGIOGRAM;  Surgeon: Jettie Booze, MD;  Location: [facility] CATH LAB;  Service: Cardiovascular;  Laterality: N/A;   PERCUTANEOUS CORONARY STENT INTERVENTION (PCI-S)  05/24/2014   Procedure: PERCUTANEOUS CORONARY STENT INTERVENTION (PCI-S);  Surgeon: Jettie Booze, MD;  Location: [facility] CATH LAB;  Service: Cardiovascular;;   PILONIDAL CYST EXCISION  1995   Social History:  reports that he has been smoking cigarettes. He has a 23.00 pack-year smoking history. He has never used smokeless tobacco. He reports current alcohol use of about 24.0 standard drinks of alcohol per week. He reports that he does not use drugs.  Allergies  Allergen Reactions   Gadolinium Derivatives Nausea Only    MRI dye    Family History  Problem Relation Age of Onset   Thyroid disease Mother    Heart disease Father 74       MI, stents   Peripheral Artery Disease Father    Diabetes type II Father    Diabetes Father    Colon cancer Father    Diabetes Maternal Grandmother    Hypertension Maternal Grandfather    Hypertension Paternal Grandfather    Cancer Neg Hx    Stroke  Neg Hx    Other Neg Hx        hypogonadism   Pancreatic cancer Neg Hx    Esophageal cancer Neg Hx     Prior to Admission medications   Medication Sig Start Date End Date Taking? Authorizing Provider  acetaminophen (TYLENOL) 325 MG tablet Take 2 tablets (650 mg total) by mouth every 4 (four) hours as needed for headache or mild pain. 07/09/21   Regalado, Belkys A, MD  ALPRAZolam (XANAX) 0.5 MG tablet TAKE ONE TABLET BY MOUTH DAILY AS NEEDED FOR ANXIETY Patient taking differently: Take 0.5 mg by mouth daily as needed for anxiety. 07/06/21   Denita Lung, MD  amLODipine (NORVASC) 5 MG tablet Take 1  tablet (5 mg total) by mouth daily. 07/10/21   Regalado, Jerald Kief A, MD  aspirin EC 81 MG EC tablet Take 1 tablet (81 mg total) by mouth daily. 05/25/14   Theora Gianotti, NP  atorvastatin (LIPITOR) 40 MG tablet TAKE ONE TABLET BY MOUTH DAILY AT [facility] 06/03/21   Elouise Munroe, MD  Blood Glucose Monitoring Suppl (ONETOUCH VERIO) w/Device KIT 1 kit by Does not apply route.    [provider]  carvedilol (COREG) 25 MG tablet Take 1 tablet (25 mg total) by mouth 2 (two) times daily with a meal. 07/09/21   Regalado, Belkys A, MD  Continuous Blood Gluc Sensor (FREESTYLE LIBRE 2 SENSOR) MISC 1 Device by Does not apply route every 14 (fourteen) days. 05/26/21   Renato Shin, MD  doxycycline (VIBRA-TABS) 100 MG tablet Take 100 mg by mouth 2 (two) times daily.    [provider]  emtricitabine-tenofovir (TRUVADA) 200-300 MG tablet Take 1 tablet by mouth daily. 11/13/19   [provider]  glucose blood (ONETOUCH VERIO) test strip Use as instructed to check blood sugar once daily. 07/10/20   Denita Lung, MD  Insulin Glargine [facility] KWIKPEN) 100 UNIT/ML Inject 20 Units into the skin daily. 08/14/21   Elayne Snare, MD  insulin lispro (HUMALOG KWIKPEN) 100 UNIT/ML KwikPen Inject 9-12 Units into the skin 3 (three) times daily with meals. And pen needles 4/day 07/06/21   Renato Shin, MD  lisinopril (ZESTRIL) 40 MG tablet Take 1 tablet (40 mg total) by mouth daily. 07/10/21   Regalado, Belkys A, MD  Multiple Vitamin (MULTIVITAMIN WITH MINERALS) TABS tablet Take 1 tablet by mouth daily.    [provider]  OneTouch Delica Lancets 10U MISC 1 each by Does not apply route daily. Check blood sugar once daily 07/10/20   Denita Lung, MD    Physical Exam: Vitals:   10/28/21 2100 10/28/21 2130 10/28/21 2200 10/29/21 0000  BP: 119/81 105/68 112/72 106/77  Pulse: 93 95 100 (!) 110  Resp: (!) 21 (!) 21 (!) 21 20  Temp:    98.9 F (37.2 C)  TempSrc:    Oral  SpO2: 100% 99% 99% 99%   Weight:      Height:       Constitutional: NAD, calm, comfortable, lethargic male laying in bed with eyes mostly closed Eyes:lids and conjunctivae normal ENMT: Mucous membranes are moist.  Neck: normal, supple Respiratory: clear to auscultation bilaterally, no wheezing, no crackles. Normal respiratory effort. No accessory muscle use.  Cardiovascular: Regular rate and rhythm, no murmurs / rubs / gallops. No extremity edema.  Abdomen: Soft, nontender nondistended.  Bowel sounds positive.  Musculoskeletal: no clubbing / cyanosis. No joint deformity upper and lower extremities. Good ROM, no contractures. Normal muscle tone.  Skin: no rashes, lesions, ulcers. No induration Neurologic: CN 2-12 grossly intact.  Patient is lethargic but was still oriented to person place and time.   Psychiatric: Unable to fully assess with acute metabolic encephalopathy Data Reviewed:  See HPI  Assessment and Plan: * DKA (diabetic ketoacidosis) ([place]) Presented with pH of 7.14, bicarb of 6 with anion gap of 27. - replete potassium as needed - continue insulin gtt with goal of 140-180 and AG <12 - IV NS until BG <250, then switch to D5 LR - BMP q4hr  - keep NPO   Acute metabolic encephalopathy Secondary to DKA.  Continue to monitor on IV insulin infusion.  AKI (acute kidney injury) ([place]) Creatinine elevated at 2.21 up from 0.52. - Continue to monitor with IV fluid and follow creatinine in the morning  Community acquired pneumonia CTA chest showed possible bilateral pneumonia.  Start IV Rocephin and azithromycin.  Hypertension Controlled.  Hold antihypertensives for now with AKI and lethargy from DKA  Hyperlipidemia Resume statin once DKA improves and is able to come off of insulin infusion      Advance Care Planning:   Code Status: Prior full  Consults: None  Family Communication: None at bedside  Severity of Illness: The appropriate patient status for this patient is OBSERVATION.  Observation status is judged to be reasonable and necessary in order to provide the required intensity of service to ensure the patient's safety. The patient's presenting symptoms, physical exam findings, and initial radiographic and laboratory data in the context of their medical condition is felt to place them at decreased risk for further clinical deterioration. Furthermore, it is anticipated that the patient will be medically stable for discharge from the hospital within 2 midnights of admission.   Author: Orene Desanctis, DO 10/29/2021 1:42 AM  For on call review [email or web address].

## 2021-10-29 NOTE — ED Notes (Addendum)
MD notified RN plan is to stop the insulin gtt 2 hours after Semeglee is given. MD made RN aware to not start SSI until insulin drip is discontinued.

## 2021-10-29 NOTE — ED Notes (Signed)
Patient noted to be tired out, wanting to sleep, and lethargy. NAD noted at this time.

## 2021-10-29 NOTE — Progress Notes (Signed)
Pt arrived to 4E from ED. A&Ox4. VSS. CHG bath given. Tele applied; CCMD called. CBG 153.Pt oriented to room, call light in reach.  Brooke Pace, RN

## 2021-10-29 NOTE — Assessment & Plan Note (Signed)
Secondary to DKA.  Continue to monitor on IV insulin infusion.

## 2021-10-29 NOTE — Plan of Care (Signed)
  Problem: Coping: Goal: Ability to adjust to condition or change in health will improve Outcome: Progressing   Problem: Health Behavior/Discharge Planning: Goal: Ability to manage health-related needs will improve Outcome: Progressing   Problem: Metabolic: Goal: Ability to maintain appropriate glucose levels will improve Outcome: Progressing   Problem: Skin Integrity: Goal: Risk for impaired skin integrity will decrease Outcome: Progressing   

## 2021-10-29 NOTE — ED Notes (Signed)
RN went in to introduce self to pt. Pt resting in bed with eyes closed, rise and fall noted in the pt chest. VSS. Plan of care continues.

## 2021-10-29 NOTE — Assessment & Plan Note (Signed)
CTA chest showed possible bilateral pneumonia.  Start IV Rocephin and azithromycin.

## 2021-10-30 ENCOUNTER — Other Ambulatory Visit (HOSPITAL_COMMUNITY): Payer: Self-pay

## 2021-10-30 DIAGNOSIS — E1110 Type 2 diabetes mellitus with ketoacidosis without coma: Secondary | ICD-10-CM | POA: Diagnosis not present

## 2021-10-30 DIAGNOSIS — N179 Acute kidney failure, unspecified: Secondary | ICD-10-CM | POA: Diagnosis not present

## 2021-10-30 DIAGNOSIS — G9341 Metabolic encephalopathy: Secondary | ICD-10-CM | POA: Diagnosis not present

## 2021-10-30 DIAGNOSIS — J189 Pneumonia, unspecified organism: Secondary | ICD-10-CM | POA: Diagnosis not present

## 2021-10-30 LAB — GLUCOSE, CAPILLARY: Glucose-Capillary: 253 mg/dL — ABNORMAL HIGH (ref 70–99)

## 2021-10-30 MED ORDER — METOPROLOL TARTRATE 25 MG PO TABS
12.5000 mg | ORAL_TABLET | Freq: Two times a day (BID) | ORAL | 0 refills | Status: DC
  Filled 2021-10-30 – 2021-11-30 (×2): qty 30, 30d supply, fill #0

## 2021-10-30 MED ORDER — AZITHROMYCIN 500 MG PO TABS: 500.0000 mg | ORAL_TABLET | Freq: Every day | ORAL | Status: DC

## 2021-10-30 MED ORDER — CEFDINIR 300 MG PO CAPS
300.0000 mg | ORAL_CAPSULE | Freq: Two times a day (BID) | ORAL | 0 refills | Status: AC
  Filled 2021-10-30: qty 6, 3d supply, fill #0

## 2021-10-30 MED ORDER — INSULIN GLARGINE-YFGN 100 UNIT/ML ~~LOC~~ SOLN
25.0000 [IU] | Freq: Every day | SUBCUTANEOUS | Status: DC
  Administered 2021-10-30: 25 [IU] via SUBCUTANEOUS
  Filled 2021-10-30: qty 0.25

## 2021-10-30 MED ORDER — BASAGLAR KWIKPEN 100 UNIT/ML ~~LOC~~ SOPN: 25.0000 [IU] | PEN_INJECTOR | Freq: Every day | SUBCUTANEOUS | 0 refills | Status: DC

## 2021-10-30 NOTE — Plan of Care (Signed)
  Problem: Skin Integrity: Goal: Risk for impaired skin integrity will decrease Outcome: Progressing   

## 2021-10-30 NOTE — Progress Notes (Signed)
Physical Therapy Treatment Patient Details Name: [name] [name] MRN: [id number] DOB: March 21, 1975 Today's Date: 10/30/2021   History of Present Illness 46 y.o. male presents to [facility] hospital on 10/28/2021 with weakness, nausea and vomiting, found to be in DKA and with PNA. PMH includes DMII, CAD, HTN.    PT Comments    Pt received supine, pleasant and motivated for session for focus on gait training for increased activity tolerance and improved balance. Pt demonstrating ambulation without AD with no overt LOB and ability to accept mild balance challenges with head turns in all planes during gait with pt able to self correct any and all LOB with stepping strategies. Pt able to ascend/descend full flight of stairs without fault. Educated pt re; activity recommendations, safety with mobility and importance of continued mobility with pt verbalizing understanding.    Recommendations for follow up therapy are one component of a multi-disciplinary discharge planning process, led by the attending physician.  Recommendations may be updated based on patient status, additional functional criteria and insurance authorization.  Follow Up Recommendations  No PT follow up     Assistance Recommended at Discharge None  Patient can return home with the following     Equipment Recommendations   (anticipate no needs at time of discharge)    Recommendations for Other Services       Precautions / Restrictions Precautions Precautions: Fall Restrictions Weight Bearing Restrictions: No     Mobility  Bed Mobility Overal bed mobility: Modified Independent                  Transfers Overall transfer level: Needs assistance Equipment used: None Transfers: Sit to/from Stand Sit to Stand: Supervision                Ambulation/Gait Ambulation/Gait assistance: Min guard Gait Distance (Feet): 450 Feet Assistive device: None Gait Pattern/deviations: Step-through pattern, Decreased stride  length Gait velocity: functional     General Gait Details: very mildy unsteady gait with short steps without AD, able to accept mild balance challenges with head turns in all planes without LOB   Stairs Stairs: Yes Stairs assistance: Min guard Stair Management: One rail Left, One rail Right, Alternating pattern, Step to pattern, Forwards Number of Stairs: 12 (flight) General stair comments: step over step on ascent, step to pattern on descent, no LOB   Wheelchair Mobility    Modified Rankin (Stroke Patients Only)       Balance Overall balance assessment: Needs assistance Sitting-balance support: No upper extremity supported, Feet supported Sitting balance-Leahy Scale: Normal     Standing balance support: No upper extremity supported, During functional activity Standing balance-Leahy Scale: Fair Standing balance comment: minA intermittently with balance challenges                            Cognition Arousal/Alertness: Awake/alert Behavior During Therapy: WFL for tasks assessed/performed Overall Cognitive Status: Within Functional Limits for tasks assessed                                          Exercises      General Comments General comments (skin integrity, edema, etc.): VSS on RA, pt endorsing he feels much better than yesterday and close to his baselien      Pertinent Vitals/Pain Pain Assessment Pain Assessment: No/denies pain    Home Living  Prior Function            PT Goals (current goals can now be found in the care plan section) Acute Rehab PT Goals PT Goal Formulation: With patient Time For Goal Achievement: 11/12/21    Frequency    Min 3X/week      PT Plan      Co-evaluation              AM-PAC PT "6 Clicks" Mobility   Outcome Measure  Help needed turning from your back to your side while in a flat bed without using bedrails?: None Help needed moving from  lying on your back to sitting on the side of a flat bed without using bedrails?: None Help needed moving to and from a bed to a chair (including a wheelchair)?: A Little Help needed standing up from a chair using your arms (e.g., wheelchair or bedside chair)?: A Little Help needed to walk in hospital room?: A Little Help needed climbing 3-5 steps with a railing? : A Little 6 Click Score: 20    End of Session   Activity Tolerance: Patient tolerated treatment well Patient left: in bed;with call bell/phone within reach Nurse Communication: Mobility status PT Visit Diagnosis: Unsteadiness on feet (R26.81)     Time: 7681-1572 PT Time Calculation (min) (ACUTE ONLY): 13 min  Charges:  $Gait Training: 8-22 mins                     Lenora Boys. PTA Acute Rehabilitation Services Office: [phone]    Catalina Antigua 10/30/2021, 10:06 AM

## 2021-10-30 NOTE — Discharge Summary (Signed)
PATIENT DETAILS Name: [name] [name] Age: 46 y.o. Sex: male Date of Birth: 28-Oct-1975 MRN: [id number]. Admitting Physician: Orene Desanctis, DO [id number], Elyse Jarvis, MD  Admit Date: 10/28/2021 Discharge date: 10/30/2021  Recommendations for Outpatient Follow-up:  Follow up with PCP in 1-2 weeks Please obtain CMP/CBC in one week  Admitted From:  Home  Disposition: Home   Discharge Condition: good  CODE STATUS:   Code Status: Prior   Diet recommendation:  Diet Order             Diet - low sodium heart healthy           Diet Carb Modified           Diet heart healthy/carb modified Room service appropriate? Yes; Fluid consistency: Thin  Diet effective now                    Brief Summary: Patient is a 46 y.o.  male with history of DM-2, CAD, HTN who presented with several days history of weakness, nausea/vomiting-found to have DKA/PNA and subsequently admitted to the hospitalist service.   Significant events: 8/23>> admit to [facility]-DKA/PNA.   Significant studies: 8/23>> CT angio chest:No PE, bilateral groundglass opacities.   Significant microbiology data: None none   Procedures: None   Consults: None  Brief Hospital Course: DKA: Resolved-stop insulin infusion-transition to SQ insulin.  Suspect DKA provoked by GI illness-and patient stopping SQ insulin.    DM-2 (A1c 8.8 on 5/1): CBGs relatively stable overnight-continues to have mild hyperglycemia-increase basal regimen to 25 units SQ daily-continue SSI.  Follow with primary endocrinologist for further optimization.  Nausea/vomiting/diarrhea: Resolved-unclear whether this was from a viral syndrome.  CAP: Nontoxic-appearing-overall better-could have aspirated-but improving with Rocephin/Zithromax-we will transition to oral Omnicef for a few more days.   Acute metabolic encephalopathy: In the setting of DKA/AKI.  Mentation much better this morning-he is completely awake/alert and looks a whole lot better than  yesterday.  He is no longer lethargic.  Per patient he is back to his baseline.  He is mobilizing independently without any difficulty.     AKI: In the setting of DKA-resolved.   Hyponatremia: Mild-continue to monitor closely.   History of CAD-s/p PCI to LAD in 2016: No anginal symptoms.  Continue aspirin/statin/beta-blocker.   HTN: BP stable-but suspect will slowly climb back up postdischarge-we will resume metoprolol at a lower dose-continue to hold amlodipine, lisinopril.  Patient has been told to stop taking Coreg as he is already on metoprolol.  He will follow-up with his primary cardiologist or his primary care practitioner for further optimization.     HLD: Continue statin.   BMI: Estimated body mass index is 22.66 kg/m as calculated from the following:   Height as of this encounter: '5\' 4"'  (1.626 m).   Weight as of this encounter: 59.9 kg.    Discharge Diagnoses:  Principal Problem:   DKA (diabetic ketoacidosis) ([place]) Active Problems:   Hyperlipidemia   Hypertension   Community acquired pneumonia   AKI (acute kidney injury) ([place])   Acute metabolic encephalopathy   Discharge Instructions:  Activity:  As tolerated  Discharge Instructions     Call MD for:  extreme fatigue   Complete by: As directed    Call MD for:  persistant dizziness or light-headedness   Complete by: As directed    Call MD for:  persistant nausea and vomiting   Complete by: As directed    Diet - low sodium heart healthy  Complete by: As directed    Diet Carb Modified   Complete by: As directed    Discharge instructions   Complete by: As directed    Follow with Primary MD  Denita Lung, MD in 1-2 weeks  Check your CBGs multiple times a day-keep a record of these readings and take it to your next appointment with your PCP/endocrinologist.  Your blood pressure has been relatively stable without the use of any antihypertensive medications during this hospitalization, with the exception of  metoprolol-all other medications have been temporarily discontinued until you see your primary care practitioner/cardiologist.  Please get a complete blood count and chemistry panel checked by your Primary MD at your next visit, and again as instructed by your Primary MD.  Get Medicines reviewed and adjusted: Please take all your medications with you for your next visit with your Primary MD  Laboratory/radiological data: Please request your Primary MD to go over all hospital tests and procedure/radiological results at the follow up, please ask your Primary MD to get all Hospital records sent to his/her office.  In some cases, they will be blood work, cultures and biopsy results pending at the time of your discharge. Please request that your primary care M.D. follows up on these results.  Also Note the following: If you experience worsening of your admission symptoms, develop shortness of breath, life threatening emergency, suicidal or homicidal thoughts you must seek medical attention immediately by calling 911 or calling your MD immediately  if symptoms less severe.  You must read complete instructions/literature along with all the possible adverse reactions/side effects for all the Medicines you take and that have been prescribed to you. Take any new Medicines after you have completely understood and accpet all the possible adverse reactions/side effects.   Do not drive when taking Pain medications or sleeping medications (Benzodaizepines)  Do not take more than prescribed Pain, Sleep and Anxiety Medications. It is not advisable to combine anxiety,sleep and pain medications without talking with your primary care practitioner  Special Instructions: If you have smoked or chewed Tobacco  in the last 2 yrs please stop smoking, stop any regular Alcohol  and or any Recreational drug use.  Wear Seat belts while driving.  Please note: You were cared for by a hospitalist during your hospital stay.  Once you are discharged, your primary care physician will handle any further medical issues. Please note that NO REFILLS for any discharge medications will be authorized once you are discharged, as it is imperative that you return to your primary care physician (or establish a relationship with a primary care physician if you do not have one) for your post hospital discharge needs so that they can reassess your need for medications and monitor your lab values.   Increase activity slowly   Complete by: As directed       Allergies as of 10/30/2021       Reactions   Gadolinium Derivatives Nausea And Vomiting   MRI dye        Medication List     STOP taking these medications    amLODipine 5 MG tablet Commonly known as: NORVASC   carvedilol 25 MG tablet Commonly known as: COREG   lisinopril 20 MG tablet Commonly known as: ZESTRIL   lisinopril 40 MG tablet Commonly known as: ZESTRIL       TAKE these medications    ALPRAZolam 0.5 MG tablet Commonly known as: XANAX TAKE ONE TABLET BY MOUTH DAILY AS NEEDED FOR  ANXIETY What changed:  how much to take how to take this when to take this reasons to take this additional instructions   aspirin EC 81 MG tablet Take 1 tablet (81 mg total) by mouth daily.   atorvastatin 40 MG tablet Commonly known as: LIPITOR TAKE ONE TABLET BY MOUTH DAILY AT 6PM What changed:  how much to take how to take this when to take this additional instructions   Basaglar KwikPen 100 UNIT/ML Inject 25 Units into the skin daily. What changed: how much to take   cefdinir 300 MG capsule Commonly known as: OMNICEF Take 1 capsule (300 mg total) by mouth 2 (two) times daily for 3 days.   emtricitabine-tenofovir 200-300 MG tablet Commonly known as: TRUVADA Take 1 tablet by mouth daily.   FreeStyle Libre 2 Sensor Misc 1 Device by Does not apply route every 14 (fourteen) days.   ibuprofen 200 MG tablet Commonly known as: ADVIL Take 400 mg by  mouth 3 (three) times daily as needed (pain).   insulin lispro 100 UNIT/ML KwikPen Commonly known as: HumaLOG KwikPen Inject 9-12 Units into the skin 3 (three) times daily with meals. And pen needles 4/day   metoprolol tartrate 25 MG tablet Commonly known as: LOPRESSOR Take 0.5 tablets (12.5 mg total) by mouth 2 (two) times daily. What changed:  medication strength how much to take when to take this   multivitamin with minerals Tabs tablet Take 1 tablet by mouth daily.   OneTouch Delica Lancets 56D Misc 1 each by Does not apply route daily. Check blood sugar once daily   OneTouch Verio test strip Generic drug: glucose blood Use as instructed to check blood sugar once daily.   OneTouch Verio w/Device Kit 1 kit by Does not apply route.        Follow-up Information     Denita Lung, MD. Schedule an appointment as soon as possible for a visit in 1 week(s).   Specialty: Family Medicine Contact information: [place] [address] [phone]         Elouise Munroe, MD Follow up in 1 month(s).   Specialties: Cardiology, Radiology Contact information: [address] [place] [address] [phone]                Allergies  Allergen Reactions   Gadolinium Derivatives Nausea And Vomiting    MRI dye     Other Procedures/Studies: CT Angio Chest PE W/Cm &/Or Wo Cm  Result Date: 10/28/2021 CLINICAL DATA:  Shortness of breath EXAM: CT ANGIOGRAPHY CHEST WITH CONTRAST TECHNIQUE: Multidetector CT imaging of the chest was performed using the standard protocol during bolus administration of intravenous contrast. Multiplanar CT image reconstructions and MIPs were obtained to evaluate the vascular anatomy. RADIATION DOSE REDUCTION: This exam was performed according to the departmental dose-optimization program which includes automated exposure control, adjustment of the mA and/or kV according to patient size and/or use of  iterative reconstruction technique. CONTRAST:  17m OMNIPAQUE IOHEXOL 350 MG/ML SOLN COMPARISON:  Chest x-ray 10/28/2021 FINDINGS: Cardiovascular: Satisfactory opacification of the pulmonary arteries to the segmental level. No evidence of pulmonary embolism. Normal heart size. No pericardial effusion. Mild motion degradation limits assessment of distal PE in the lower lobes. Aorta is nonaneurysmal. Mild atherosclerosis. Coronary vascular calcification Mediastinum/Nodes: Midline trachea. No thyroid mass. No suspicious lymph nodes. Esophagus within normal limits. Lungs/Pleura: No pleural effusion or pneumothorax. Heterogeneous bilateral foci of ground-glass density in the bilateral lungs. Upper Abdomen: No acute abnormality. Musculoskeletal:  No chest wall abnormality. No acute or significant osseous findings. Review of the MIP images confirms the above findings. IMPRESSION: 1. Negative for acute pulmonary embolus. 2. Mild heterogeneous bilateral ground-glass densities suggestive of respiratory infection/pneumonia. Aortic Atherosclerosis (ICD10-I70.0). Electronically Signed   By: Donavan Foil M.D.   On: 10/28/2021 22:42   DG Chest Port 1 View  Result Date: 10/28/2021 CLINICAL DATA:  Shortness of breath EXAM: PORTABLE CHEST 1 VIEW COMPARISON:  07/07/2021 FINDINGS: The heart size and mediastinal contours are within normal limits. Both lungs are clear. The visualized skeletal structures are unremarkable. IMPRESSION: Negative. Electronically Signed   By: Rolm Baptise M.D.   On: 10/28/2021 19:49     TODAY-DAY OF DISCHARGE:  Subjective:   [name] [name] today has no headache,no chest abdominal pain,no new weakness tingling or numbness, feels much better wants to go home today.   Objective:   Blood pressure 93/81, pulse 93, temperature 98.1 F (36.7 C), temperature source Oral, resp. rate 16, height '5\' 4"'  (1.626 m), weight 59.9 kg, SpO2 100 %.  Intake/Output Summary (Last 24 hours) at 10/30/2021 0830 Last  data filed at 10/30/2021 1610 Gross per 24 hour  Intake 1068 ml  Output 2250 ml  Net -1182 ml   Filed Weights   10/28/21 1839  Weight: 59.9 kg    Exam: Awake Alert, Oriented *3, No new F.N deficits, Normal affect [place].AT,PERRAL Supple Neck,No JVD, No cervical lymphadenopathy appriciated.  Symmetrical Chest wall movement, Good air movement bilaterally, CTAB RRR,No Gallops,Rubs or new Murmurs, No Parasternal Heave +ve B.Sounds, Abd Soft, Non tender, No organomegaly appriciated, No rebound -guarding or rigidity. No Cyanosis, Clubbing or edema, No new Rash or bruise   PERTINENT RADIOLOGIC STUDIES: CT Angio Chest PE W/Cm &/Or Wo Cm  Result Date: 10/28/2021 CLINICAL DATA:  Shortness of breath EXAM: CT ANGIOGRAPHY CHEST WITH CONTRAST TECHNIQUE: Multidetector CT imaging of the chest was performed using the standard protocol during bolus administration of intravenous contrast. Multiplanar CT image reconstructions and MIPs were obtained to evaluate the vascular anatomy. RADIATION DOSE REDUCTION: This exam was performed according to the departmental dose-optimization program which includes automated exposure control, adjustment of the mA and/or kV according to patient size and/or use of iterative reconstruction technique. CONTRAST:  8m OMNIPAQUE IOHEXOL 350 MG/ML SOLN COMPARISON:  Chest x-ray 10/28/2021 FINDINGS: Cardiovascular: Satisfactory opacification of the pulmonary arteries to the segmental level. No evidence of pulmonary embolism. Normal heart size. No pericardial effusion. Mild motion degradation limits assessment of distal PE in the lower lobes. Aorta is nonaneurysmal. Mild atherosclerosis. Coronary vascular calcification Mediastinum/Nodes: Midline trachea. No thyroid mass. No suspicious lymph nodes. Esophagus within normal limits. Lungs/Pleura: No pleural effusion or pneumothorax. Heterogeneous bilateral foci of ground-glass density in the bilateral lungs. Upper Abdomen: No acute abnormality.  Musculoskeletal: No chest wall abnormality. No acute or significant osseous findings. Review of the MIP images confirms the above findings. IMPRESSION: 1. Negative for acute pulmonary embolus. 2. Mild heterogeneous bilateral ground-glass densities suggestive of respiratory infection/pneumonia. Aortic Atherosclerosis (ICD10-I70.0). Electronically Signed   By: KDonavan FoilM.D.   On: 10/28/2021 22:42   DG Chest Port 1 View  Result Date: 10/28/2021 CLINICAL DATA:  Shortness of breath EXAM: PORTABLE CHEST 1 VIEW COMPARISON:  07/07/2021 FINDINGS: The heart size and mediastinal contours are within normal limits. Both lungs are clear. The visualized skeletal structures are unremarkable. IMPRESSION: Negative. Electronically Signed   By: KRolm BaptiseM.D.   On: 10/28/2021 19:49     PERTINENT LAB RESULTS: CBC: Recent  Labs    10/28/21 1845 10/28/21 2327  WBC 12.2*  --   HGB 15.4 15.6  HCT 45.5 46.0  PLT 199  --    CMET CMP     Component Value Date/Time   NA 130 (L) 10/29/2021 0600   NA 138 12/26/2019 1028   K 3.6 10/29/2021 0600   CL 103 10/29/2021 0600   CO2 19 (L) 10/29/2021 0600   GLUCOSE 220 (H) 10/29/2021 0600   BUN 24 (H) 10/29/2021 0600   BUN 4 (L) 12/26/2019 1028   CREATININE 1.09 10/29/2021 0600   CREATININE 0.61 11/01/2016 1348   CREATININE 0.65 11/01/2016 1348   CALCIUM 8.5 (L) 10/29/2021 0600   PROT 7.1 10/28/2021 1845   PROT 6.2 12/26/2019 1028   ALBUMIN 3.5 10/28/2021 1845   ALBUMIN 3.7 (L) 12/26/2019 1028   AST 20 10/28/2021 1845   ALT 17 10/28/2021 1845   ALKPHOS 94 10/28/2021 1845   BILITOT 2.2 (H) 10/28/2021 1845   BILITOT 0.2 12/26/2019 1028   GFRNONAA >60 10/29/2021 0600   GFRAA 142 12/26/2019 1028    GFR Estimated Creatinine Clearance: 69.4 mL/min (by C-G formula based on SCr of 1.09 mg/dL). No results for input(s): "LIPASE", "AMYLASE" in the last 72 hours. No results for input(s): "CKTOTAL", "CKMB", "CKMBINDEX", "TROPONINI" in the last 72 hours. Invalid  input(s): "POCBNP" Recent Labs    10/28/21 1858  DDIMER 0.93*   No results for input(s): "HGBA1C" in the last 72 hours. No results for input(s): "CHOL", "HDL", "LDLCALC", "TRIG", "CHOLHDL", "LDLDIRECT" in the last 72 hours. No results for input(s): "TSH", "T4TOTAL", "T3FREE", "THYROIDAB" in the last 72 hours.  Invalid input(s): "FREET3" No results for input(s): "VITAMINB12", "FOLATE", "FERRITIN", "TIBC", "IRON", "RETICCTPCT" in the last 72 hours. Coags: No results for input(s): "INR" in the last 72 hours.  Invalid input(s): "PT" Microbiology: No results found for this or any previous visit (from the past 240 hour(s)).  FURTHER DISCHARGE INSTRUCTIONS:  Get Medicines reviewed and adjusted: Please take all your medications with you for your next visit with your Primary MD  Laboratory/radiological data: Please request your Primary MD to go over all hospital tests and procedure/radiological results at the follow up, please ask your Primary MD to get all Hospital records sent to his/her office.  In some cases, they will be blood work, cultures and biopsy results pending at the time of your discharge. Please request that your primary care M.D. goes through all the records of your hospital data and follows up on these results.  Also Note the following: If you experience worsening of your admission symptoms, develop shortness of breath, life threatening emergency, suicidal or homicidal thoughts you must seek medical attention immediately by calling 911 or calling your MD immediately  if symptoms less severe.  You must read complete instructions/literature along with all the possible adverse reactions/side effects for all the Medicines you take and that have been prescribed to you. Take any new Medicines after you have completely understood and accpet all the possible adverse reactions/side effects.   Do not drive when taking Pain medications or sleeping medications (Benzodaizepines)  Do  not take more than prescribed Pain, Sleep and Anxiety Medications. It is not advisable to combine anxiety,sleep and pain medications without talking with your primary care practitioner  Special Instructions: If you have smoked or chewed Tobacco  in the last 2 yrs please stop smoking, stop any regular Alcohol  and or any Recreational drug use.  Wear Seat belts while driving.  Please  note: You were cared for by a hospitalist during your hospital stay. Once you are discharged, your primary care physician will handle any further medical issues. Please note that NO REFILLS for any discharge medications will be authorized once you are discharged, as it is imperative that you return to your primary care physician (or establish a relationship with a primary care physician if you do not have one) for your post hospital discharge needs so that they can reassess your need for medications and monitor your lab values.  Total Time spent coordinating discharge including counseling, education and face to face time equals greater than 30 minutes.  Signed: Courtny Bennison 10/30/2021 8:30 AM

## 2021-11-02 ENCOUNTER — Telehealth: Payer: Self-pay | Admitting: Family Medicine

## 2021-11-02 NOTE — Progress Notes (Deleted)
Cardiology Clinic Note   Patient Name: [name] [name] Date of Encounter: 11/02/2021  Primary Care Provider:  Denita Lung, MD Primary Cardiologist:  Elouise Munroe, MD  Patient Profile    [name] [name] 46 year old male presents to the clinic today for follow-up evaluation of his coronary artery disease, hyperlipidemia, and hypertension.  Past Medical History    Past Medical History:  Diagnosis Date   Anxiety    Coronary artery disease    a. 05/2014 OOH MI/PCI: LM nl, LAD 40p, 99p (3.0x28 Synergy DES) w/ R->L Collaterals, LCX nl, RCA 30p/d, EF 55% w/ sev distal inf/ant HK.   Depression    Diabetes mellitus without complication ([place])    Erectile dysfunction    H/O echocardiogram    a. 05/2014 Echo: Ef 45-50%, sev mid antsept/ant HK.   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     Pancreatitis 10/2018   Tobacco abuse    Past Surgical History:  Procedure Laterality Date   CORONARY STENT PLACEMENT  05/24/2014   MID LAD DES    LEFT HEART CATHETERIZATION WITH CORONARY ANGIOGRAM N/A 05/24/2014   Procedure: LEFT HEART CATHETERIZATION WITH CORONARY ANGIOGRAM;  Surgeon: Jettie Booze, MD;  Location: [facility] CATH LAB;  Service: Cardiovascular;  Laterality: N/A;   PERCUTANEOUS CORONARY STENT INTERVENTION (PCI-S)  05/24/2014   Procedure: PERCUTANEOUS CORONARY STENT INTERVENTION (PCI-S);  Surgeon: Jettie Booze, MD;  Location: [facility] CATH LAB;  Service: Cardiovascular;;   PILONIDAL CYST EXCISION  1995    Allergies  Allergies  Allergen Reactions   Gadolinium Derivatives Nausea And Vomiting    MRI dye    History of Present Illness    [name] [name] has a PMH of coronary artery disease, hypertension, hyperlipidemia, diabetes, AKI, tobacco abuse, anxiety, alcohol use disorder, and HIV.  He had delayed presentation of anterior infarct 3/16.  He underwent cardiac catheterization and  was noted to have subtotal LAD occlusion.  He underwent stenting.  His EF was normal 4/21.  He was seen in follow-up by Dr. Margaretann Loveless 07/20/2021.  He had been admitted 07/06/2021 with acute pancreatitis.  He was noted to have chest and abdominal pain for 3 days prior to admission.  His blood pressure was severely elevated and he had mildly elevated troponin.  CT of his abdomen pelvis noted inflammatory changes consistent with pancreatitis.  His blood pressure was 212/133.  He received Nitropaste.  His metoprolol was changed to carvedilol.  His lisinopril was increased and he was started on Norvasc.  During that time he denied palpitations, chest pain shortness of breath.  Due to his recent health issues he reported to his moved to [place].  He was admitted to the hospital 10/29/2021 and discharged on 10/30/2021.  He reported several days of weakness, nausea/vomiting.  He was found to have DKA and pneumonia.  His CT chest angio showed no PE and bilateral ground glass opacities.  He received insulin infusion was transitioned to subcu insulin.  It was felt that his DKA was provoked by GI illness and patient had stopped taking his subcu insulin.  He received Rocephin and azithromycin and was transitioned to oral Omnicef.  His blood pressure remained stable but did began to increase he was instructed to resume his metoprolol and continue to hold his amlodipine and lisinopril.  He was instructed to follow-up with cardiology.  He presents to the clinic today  for follow-up evaluation states***  *** denies chest pain, shortness of breath, lower extremity edema, fatigue, palpitations, melena, hematuria, hemoptysis, diaphoresis, weakness, presyncope, syncope, orthopnea, and PND.   Essential hypertension-BP today*** Continue metoprolol Heart healthy low-sodium diet-salty 6 given Increase physical activity as tolerated Maintain blood pressure log  Hyperlipidemia-LDL*** Continue aspirin, atorvastatin Heart healthy  low-sodium diet-salty 6 given Increase physical activity as tolerated  Coronary artery disease-no chest pain today.  Underwent cardiac catheterization 4/16 which showed occluded LAD.  He received PCI with DES x1.  His echocardiogram 3/16 showed an EF of 45-50% Continue metoprolol, aspirin, atorvastatin Heart healthy low-sodium diet-salty 6 given Increase physical activity as tolerated  Tobacco abuse-continues to smoke*** Smoking cessation information given Smoking cessation strongly encouraged  Disposition: Follow-up with Dr. Margaretann Loveless or me in 4 to 6 months.  Home Medications    Prior to Admission medications   Medication Sig Start Date End Date Taking? Authorizing Provider  ALPRAZolam (XANAX) 0.5 MG tablet TAKE ONE TABLET BY MOUTH DAILY AS NEEDED FOR ANXIETY Patient taking differently: Take 0.5 mg by mouth daily as needed for anxiety. 07/06/21   Denita Lung, MD  aspirin EC 81 MG EC tablet Take 1 tablet (81 mg total) by mouth daily. 05/25/14   Theora Gianotti, NP  atorvastatin (LIPITOR) 40 MG tablet TAKE ONE TABLET BY MOUTH DAILY AT 6PM Patient taking differently: Take 40 mg by mouth daily. 06/03/21   Elouise Munroe, MD  Blood Glucose Monitoring Suppl (ONETOUCH VERIO) w/Device KIT 1 kit by Does not apply route.    [provider]  cefdinir (OMNICEF) 300 MG capsule Take 1 capsule (300 mg total) by mouth 2 (two) times daily for 3 days. 10/30/21 11/02/21  Ghimire, Henreitta Leber, MD  Continuous Blood Gluc Sensor (FREESTYLE LIBRE 2 SENSOR) MISC 1 Device by Does not apply route every 14 (fourteen) days. 05/26/21   Renato Shin, MD  emtricitabine-tenofovir (TRUVADA) 200-300 MG tablet Take 1 tablet by mouth daily. 11/13/19   [provider]  glucose blood (ONETOUCH VERIO) test strip Use as instructed to check blood sugar once daily. 07/10/20   Denita Lung, MD  ibuprofen (ADVIL) 200 MG tablet Take 400 mg by mouth 3 (three) times daily as needed (pain).    [provider]  Insulin Glargine (BASAGLAR KWIKPEN) 100 UNIT/ML Inject 25 Units into the skin daily. 10/30/21   Ghimire, Henreitta Leber, MD  insulin lispro (HUMALOG KWIKPEN) 100 UNIT/ML KwikPen Inject 9-12 Units into the skin 3 (three) times daily with meals. And pen needles 4/day 07/06/21   Renato Shin, MD  metoprolol tartrate (LOPRESSOR) 25 MG tablet Take 1/2 tablet (12.5 mg total) by mouth 2 (two) times daily. 10/30/21   Ghimire, Henreitta Leber, MD  Multiple Vitamin (MULTIVITAMIN WITH MINERALS) TABS tablet Take 1 tablet by mouth daily.    [provider]  OneTouch Delica Lancets 20N MISC 1 each by Does not apply route daily. Check blood sugar once daily 07/10/20   Denita Lung, MD    Family History    Family History  Problem Relation Age of Onset   Thyroid disease Mother    Heart disease Father 36       MI, stents   Peripheral Artery Disease Father    Diabetes type II Father    Diabetes Father    Colon cancer Father    Diabetes Maternal Grandmother    Hypertension Maternal Grandfather    Hypertension Paternal Grandfather    Cancer Neg Hx  Stroke Neg Hx    Other Neg Hx        hypogonadism   Pancreatic cancer Neg Hx    Esophageal cancer Neg Hx    He indicated that his mother is alive. He indicated that his father is alive. He indicated that both of his sisters are alive. He indicated that both of his brothers are alive. He indicated that the status of his maternal grandmother is unknown. He indicated that the status of his maternal grandfather is unknown. He indicated that the status of his paternal grandfather is unknown. He indicated that the status of his neg hx is unknown.  Social History    Social History   Socioeconomic History   Marital status: Single    Spouse name: Not on file   Number of children: Not on file   Years of education: Not on file   Highest education level: Not on file  Occupational History   Occupation: Secondary school teacher  Tobacco Use    Smoking status: Every Day    Packs/day: 1.00    Years: 23.00    Total pack years: 23.00    Types: Cigarettes   Smokeless tobacco: Never   Tobacco comments:    currently smoking 0.5 ppd, declines patch  Vaping Use   Vaping Use: Never used  Substance and Sexual Activity   Alcohol use: Yes    Alcohol/week: 24.0 standard drinks of alcohol    Types: 24 Cans of beer per week    Comment: 1/5-3/4 Fifth, 4-5 times a week.  +Guilty, cutting down; 2/4 CAGE questions.   Drug use: No   Sexual activity: Yes    Partners: Male  Other Topics Concern   Not on file  Social History Narrative   Lives at home with roommate, exercise at gym in apartment complex, diet - no discretion.       Social Determinants of Health   Financial Resource Strain: Not on file  Food Insecurity: Not on file  Transportation Needs: Not on file  Physical Activity: Not on file  Stress: Not on file  Social Connections: Not on file  Intimate Partner Violence: Not on file     Review of Systems    General:  No chills, fever, night sweats or weight changes.  Cardiovascular:  No chest pain, dyspnea on exertion, edema, orthopnea, palpitations, paroxysmal nocturnal dyspnea. Dermatological: No rash, lesions/masses Respiratory: No cough, dyspnea Urologic: No hematuria, dysuria Abdominal:   No nausea, vomiting, diarrhea, bright red blood per rectum, melena, or hematemesis Neurologic:  No visual changes, wkns, changes in mental status. All other systems reviewed and are otherwise negative except as noted above.  Physical Exam    VS:  There were no vitals taken for this visit. , BMI There is no height or weight on file to calculate BMI. GEN: Well nourished, well developed, in no acute distress. HEENT: normal. Neck: Supple, no JVD, carotid bruits, or masses. Cardiac: RRR, no murmurs, rubs, or gallops. No clubbing, cyanosis, edema.  Radials/DP/PT 2+ and equal bilaterally.  Respiratory:  Respirations regular and unlabored,  clear to auscultation bilaterally. GI: Soft, nontender, nondistended, BS + x 4. MS: no deformity or atrophy. Skin: warm and dry, no rash. Neuro:  Strength and sensation are intact. Psych: Normal affect.  Accessory Clinical Findings    Recent Labs: 10/28/2021: ALT 17; B Natriuretic Peptide 51.0; Hemoglobin 15.6; Platelets 199 10/29/2021: BUN 24; Creatinine, Ser 1.09; Potassium 3.6; Sodium 130   Recent Lipid Panel    Component  Value Date/Time   CHOL 149 07/07/2021 0822   CHOL 178 05/30/2019 0935   TRIG 87 07/07/2021 0822   HDL 51 07/07/2021 0822   HDL 73 05/30/2019 0935   CHOLHDL 2.9 07/07/2021 0822   VLDL 17 07/07/2021 0822   LDLCALC 81 07/07/2021 0822   LDLCALC 92 05/30/2019 0935    No BP recorded.  {Refresh Note OR Click here to enter BP  :1}***    ECG personally reviewed by me today- *** - No acute changes  Echocardiogram 07/07/21 IMPRESSIONS     1. Left ventricular ejection fraction, by estimation, is 60 to 65%. The  left ventricle has normal function. The left ventricle has no regional  wall motion abnormalities. Left ventricular diastolic parameters are  indeterminate.   2. Right ventricular systolic function is normal. The right ventricular  size is normal. Tricuspid regurgitation signal is inadequate for assessing  PA pressure.   3. The mitral valve is normal in structure. Trivial mitral valve  regurgitation. No evidence of mitral stenosis.   4. The aortic valve was not well visualized. Aortic valve regurgitation  is trivial. No aortic stenosis is present.   5. The inferior vena cava is normal in size with greater than 50%  respiratory variability, suggesting right atrial pressure of 3 mmHg.  Assessment & Plan   1.  ***   Jossie Ng. Jaydeen Darley NP-C     11/02/2021, 5:15 PM [facility] [place] [address] Office [phone] Fax [phone]  Notice: This dictation was prepared with Dragon dictation along with smaller phrase  technology. Any transcriptional errors that result from this process are unintentional and may not be corrected upon review.  I spent***minutes examining this patient, reviewing medications, and using patient centered shared decision making involving her cardiac care.  Prior to her visit I spent greater than 20 minutes reviewing her past medical history,  medications, and prior cardiac tests.

## 2021-11-02 NOTE — Telephone Encounter (Signed)
Left message for pt to concerning recent hospital stay. Message did advise pt needed follow up with JCL and to call back ASAP to schedule an appointment.

## 2021-11-04 ENCOUNTER — Ambulatory Visit: Payer: Commercial Managed Care - HMO | Attending: General Practice | Admitting: General Practice

## 2021-11-11 ENCOUNTER — Encounter: Payer: Self-pay | Admitting: Family Medicine

## 2021-11-11 ENCOUNTER — Encounter: Payer: Self-pay | Admitting: Nurse Practitioner

## 2021-11-11 ENCOUNTER — Ambulatory Visit: Payer: Commercial Managed Care - HMO | Attending: General Practice | Admitting: Nurse Practitioner

## 2021-11-11 ENCOUNTER — Ambulatory Visit (INDEPENDENT_AMBULATORY_CARE_PROVIDER_SITE_OTHER): Payer: Commercial Managed Care - HMO | Admitting: Family Medicine

## 2021-11-11 VITALS — BP 112/72 | HR 82 | Ht 64.0 in | Wt 128.8 lb

## 2021-11-11 VITALS — BP 120/72 | HR 85 | Temp 98.1°F | Wt 129.8 lb

## 2021-11-11 DIAGNOSIS — J189 Pneumonia, unspecified organism: Secondary | ICD-10-CM

## 2021-11-11 DIAGNOSIS — Z23 Encounter for immunization: Secondary | ICD-10-CM

## 2021-11-11 DIAGNOSIS — Z8639 Personal history of other endocrine, nutritional and metabolic disease: Secondary | ICD-10-CM

## 2021-11-11 DIAGNOSIS — E785 Hyperlipidemia, unspecified: Secondary | ICD-10-CM

## 2021-11-11 DIAGNOSIS — I2510 Atherosclerotic heart disease of native coronary artery without angina pectoris: Secondary | ICD-10-CM

## 2021-11-11 DIAGNOSIS — Z8719 Personal history of other diseases of the digestive system: Secondary | ICD-10-CM | POA: Diagnosis not present

## 2021-11-11 DIAGNOSIS — Z7252 High risk homosexual behavior: Secondary | ICD-10-CM

## 2021-11-11 DIAGNOSIS — I10 Essential (primary) hypertension: Secondary | ICD-10-CM | POA: Diagnosis not present

## 2021-11-11 DIAGNOSIS — F1011 Alcohol abuse, in remission: Secondary | ICD-10-CM

## 2021-11-11 DIAGNOSIS — Z794 Long term (current) use of insulin: Secondary | ICD-10-CM

## 2021-11-11 DIAGNOSIS — E118 Type 2 diabetes mellitus with unspecified complications: Secondary | ICD-10-CM | POA: Diagnosis not present

## 2021-11-11 DIAGNOSIS — Z720 Tobacco use: Secondary | ICD-10-CM

## 2021-11-11 NOTE — Patient Instructions (Signed)
Medication Instructions:  Your physician recommends that you continue on your current medications as directed. Please refer to the Current Medication list given to you today.  *If you need a refill on your cardiac medications before your next appointment, please call your pharmacy*   Lab Work: Your physician recommends that you return for lab work ASAP:  LIPIDS and LFT's  If you have labs (blood work) drawn today and your tests are completely normal, you will receive your results only by: MyChart Message (if you have MyChart) OR A paper copy in the mail If you have any lab test that is abnormal or we need to change your treatment, we will call you to review the results.   Testing/Procedures: NONE   Follow-Up: At [facility], you and your health needs are our priority.  As part of our continuing mission to provide you with exceptional heart care, we have created designated Provider Care Teams.  These Care Teams include your primary Cardiologist (physician) and Advanced Practice Providers (APPs -  Physician Assistants and Nurse Practitioners) who all work together to provide you with the care you need, when you need it.  We recommend signing up for the patient portal called "MyChart".  Sign up information is provided on this After Visit Summary.  MyChart is used to connect with patients for Virtual Visits (Telemedicine).  Patients are able to view lab/test results, encounter notes, upcoming appointments, etc.  Non-urgent messages can be sent to your provider as well.   To learn more about what you can do with MyChart, go to [email or web address].    Your next appointment:   6 month(s)  The format for your next appointment:   In Person  Provider:   Parke Poisson, MD

## 2021-11-11 NOTE — Progress Notes (Signed)
Office Visit    Patient Name: [name] [name] Date of Encounter: 11/11/2021  Primary Care Provider:  Denita Lung, MD Primary Cardiologist:  Elouise Munroe, MD  Chief Complaint    46 year old male with a history of CAD s/p DES-LAD in 2016, hypertension, hyperlipidemia, type 2 diabetes, pancreatitis with alcohol use, tobacco use, anxiety and depression who presents for hospital follow-up related to CAD,  hypertension and DKA.  Past Medical History    Past Medical History:  Diagnosis Date   Anxiety    Coronary artery disease    a. 05/2014 OOH MI/PCI: LM nl, LAD 40p, 99p (3.0x28 Synergy DES) w/ R->L Collaterals, LCX nl, RCA 30p/d, EF 55% w/ sev distal inf/ant HK.   Depression    Diabetes mellitus without complication ([place])    Erectile dysfunction    H/O echocardiogram    a. 05/2014 Echo: Ef 45-50%, sev mid antsept/ant HK.   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     Pancreatitis 10/2018   Tobacco abuse    Past Surgical History:  Procedure Laterality Date   CORONARY STENT PLACEMENT  05/24/2014   MID LAD DES    LEFT HEART CATHETERIZATION WITH CORONARY ANGIOGRAM N/A 05/24/2014   Procedure: LEFT HEART CATHETERIZATION WITH CORONARY ANGIOGRAM;  Surgeon: Jettie Booze, MD;  Location: [facility] CATH LAB;  Service: Cardiovascular;  Laterality: N/A;   PERCUTANEOUS CORONARY STENT INTERVENTION (PCI-S)  05/24/2014   Procedure: PERCUTANEOUS CORONARY STENT INTERVENTION (PCI-S);  Surgeon: Jettie Booze, MD;  Location: [facility] CATH LAB;  Service: Cardiovascular;;   PILONIDAL CYST EXCISION  1995    Allergies  Allergies  Allergen Reactions   Gadolinium Derivatives Nausea And Vomiting    MRI dye    History of Present Illness    46 year old male with the above past medical history including CAD s/p DES-LAD in 2016, hypertension, hyperlipidemia, type 2 diabetes, pancreatitis with alcohol use,  tobacco use, anxiety and depression.  He has a history of CAD with delayed presentation anterior infarct in March 2016.  He was found to have subtotal LAD occlusion which was treated with a DES.  EF was normal and 2021.  He has a history of daytime fatigue, however, sleep study was declined by his insurance.  He was hospitalized in 07/2021 for acute pancreatitis.  His BP was elevated at that time.  He was transition from metoprolol to carvedilol, lisinopril was increased to 40 mg daily, and he was started on amlodipine.  He was last seen in the office on 07/20/2021 and was stable from a cardiac standpoint.  He reported plans to move out of state at that time.  He denies symptoms concerning for angina.  BP was well controlled.  Hospitalized in 10/2021 in the setting of DKA thought to be brought on by acute GI illness. Additionally, he was treated for community-acquired pneumonia.  Discharged home in stable condition on 10/30/2021.  He presents today for follow-up.  Since his last visit and since his recent hospitalization he has done well from a cardiac standpoint.  He denies any dyspnea, denies symptoms concerning for angina.  His BP has been well controlled.  He states he just recently restarted his cholesterol. He continues to smoke cigars occasionally.  He had been traveling to and from [place] however, he now plans to settle in [place].  Overall, he reports feeling well and denies any new  concerns today.   Home Medications    Current Outpatient Medications  Medication Sig Dispense Refill   ALPRAZolam (XANAX) 0.5 MG tablet TAKE ONE TABLET BY MOUTH DAILY AS NEEDED FOR ANXIETY (Patient taking differently: Take 0.5 mg by mouth daily as needed for anxiety.) 30 tablet 0   aspirin EC 81 MG EC tablet Take 1 tablet (81 mg total) by mouth daily.     atorvastatin (LIPITOR) 40 MG tablet TAKE ONE TABLET BY MOUTH DAILY AT 6PM (Patient taking differently: Take 40 mg by mouth daily.) 90 tablet 3   Blood Glucose  Monitoring Suppl (ONETOUCH VERIO) w/Device KIT 1 kit by Does not apply route.     Continuous Blood Gluc Sensor (FREESTYLE LIBRE 2 SENSOR) MISC 1 Device by Does not apply route every 14 (fourteen) days. 6 each 3   emtricitabine-tenofovir (TRUVADA) 200-300 MG tablet Take 1 tablet by mouth daily.     glucose blood (ONETOUCH VERIO) test strip Use as instructed to check blood sugar once daily. 100 each 12   ibuprofen (ADVIL) 200 MG tablet Take 400 mg by mouth 3 (three) times daily as needed (pain).     Insulin Glargine (BASAGLAR KWIKPEN) 100 UNIT/ML Inject 25 Units into the skin daily. 30 mL 0   insulin lispro (HUMALOG KWIKPEN) 100 UNIT/ML KwikPen Inject 9-12 Units into the skin 3 (three) times daily with meals. And pen needles 4/day 45 mL 1   metoprolol tartrate (LOPRESSOR) 25 MG tablet Take 1/2 tablet (12.5 mg total) by mouth 2 (two) times daily. 60 tablet 0   Multiple Vitamin (MULTIVITAMIN WITH MINERALS) TABS tablet Take 1 tablet by mouth daily.     OneTouch Delica Lancets 59Y MISC 1 each by Does not apply route daily. Check blood sugar once daily 100 each 3   Current Facility-Administered Medications  Medication Dose Route Frequency Provider Last Rate Last Admin   testosterone cypionate (DEPOTESTOSTERONE CYPIONATE) injection 200 mg  200 mg Intramuscular Q14 Days Denita Lung, MD   200 mg at 01/17/20 0941     Review of Systems   He denies chest pain, palpitations, dyspnea, pnd, orthopnea, n, v, dizziness, syncope, edema, weight gain, or early satiety. All other systems reviewed and are otherwise negative except as noted above.   Physical Exam    VS:  BP 112/72   Pulse 82   Ht _0  (1.626 m)   Wt 128 lb 12.8 oz (58.4 kg)   SpO2 98%   BMI 22.11 kg/m   GEN: Well nourished, well developed, in no acute distress. HEENT: normal. Neck: Supple, no JVD, carotid bruits, or masses. Cardiac: RRR, no murmurs, rubs, or gallops. No clubbing, cyanosis, edema.  Radials/DP/PT 2+ and equal bilaterally.   Respiratory:  Respirations regular and unlabored, clear to auscultation bilaterally. GI: Soft, nontender, nondistended, BS + x 4. MS: no deformity or atrophy. Skin: warm and dry, no rash. Neuro:  Strength and sensation are intact. Psych: Normal affect.  Accessory Clinical Findings    ECG personally reviewed by me today - no EKG in office today.    Lab Results  Component Value Date   WBC 12.2 (H) 10/28/2021   HGB 15.6 10/28/2021   HCT 46.0 10/28/2021   MCV 97.0 10/28/2021   PLT 199 10/28/2021   Lab Results  Component Value Date   CREATININE 1.09 10/29/2021   BUN 24 (H) 10/29/2021   NA 130 (L) 10/29/2021   K 3.6 10/29/2021   CL 103 10/29/2021   CO2 19 (L) 10/29/2021  Lab Results  Component Value Date   ALT 17 10/28/2021   AST 20 10/28/2021   ALKPHOS 94 10/28/2021   BILITOT 2.2 (H) 10/28/2021   Lab Results  Component Value Date   CHOL 149 07/07/2021   HDL 51 07/07/2021   LDLCALC 81 07/07/2021   TRIG 87 07/07/2021   CHOLHDL 2.9 07/07/2021    Lab Results  Component Value Date   HGBA1C 8.8 (A) 07/06/2021    Assessment & Plan    1. CAD: S/p MI in 2016, DES-LAD in 2021. Stable with no anginal symptoms. No indication for ischemic evaluation.  Continue aspirin, metoprolol, Lipitor.  2. Hypertension: BP well controlled. Continue current antihypertensive regimen.   3. Hyperlipidemia: LDL was 81 in 07/2021.  He just restarted his atorvastatin.  Will recheck fasting lipids, LFTs in 2-4 weeks.  Continue aspirin, Lipitor.  4. Type 2 diabetes: A1C was 8.8 in 07/06/2021.  Recent admission with DKA.  Following with endocrinology.   5. Tobacco use: Smokes cigars occasionally.  Full cessation advised.  6. Disposition: Follow-up in 6 months.     Lenna Sciara, NP 11/11/2021, 10:35 AM

## 2021-11-11 NOTE — Progress Notes (Signed)
   Subjective:    Patient ID: [name] [name], male    DOB: 1975-12-12, 46 y.o.   MRN: [id number]  HPI He is here for follow-up visit after recent admission to the hospital for treatment of diabetic ketoacidosis.  He was admitted on August 23 and sent home on August 25.  He was also diagnosed with community-acquired pneumonia.  Presently he is not taking any antibiotics stating he finished them.  He is on a sliding scale of NovoLog and also is taking Basaglar 25 units.  He was last seen by Dr. Everardo All in March.  He also has a previous history of pancreatitis.  He continues to drink his states that he has cut back.  He did see cardiology earlier today and presently is taking metoprolol.  He continues to go to the health department to get his Truvada.  He also complains of difficulty with his left hand stating he sometimes has a hard time with proprioception of his hand not knowing exactly where his hand is.  He also states that he has seen a change in the color of his stool.   Review of Systems     Objective:   Physical Exam Alert and in no distress.  Exam of the left hand shows normal sensation and strength and proprioception.       Assessment & Plan:  History of diabetic ketoacidosis - Plan: Ambulatory referral to Endocrinology, CBC with Differential/Platelet, Comprehensive metabolic panel  Alcohol use disorder, mild, in early remission  History of pancreatitis  Need for influenza vaccination - Plan: Flu Vaccine QUAD 81mo+IM (Fluarix, Fluzone & Alfiuria Quad PF)  Community acquired pneumonia, unspecified laterality  High risk homosexual behavior Discussed the pancreatitis with him in regard to alcohol and general health.  No comments concerning the stool issue.  No comments referring to the stool issue.  We will refer him back to endocrine since he is on a sliding scale to get this under better control.  No further intervention needed since he is not having any pulmonary symptoms.  He  will continue to go to the health department for his Truvada.  Discussed the hand with him and recommend that we continue to monitor this and if things get worse we can refer to neurology.  He was comfortable with that.

## 2021-11-12 LAB — CBC WITH DIFFERENTIAL/PLATELET
Basophils Absolute: 0.1 10*3/uL (ref 0.0–0.2)
Basos: 1 %
EOS (ABSOLUTE): 0.1 10*3/uL (ref 0.0–0.4)
Eos: 1 %
Hematocrit: 39.1 % (ref 37.5–51.0)
Hemoglobin: 13.3 g/dL (ref 13.0–17.7)
Immature Grans (Abs): 0 10*3/uL (ref 0.0–0.1)
Immature Granulocytes: 0 %
Lymphocytes Absolute: 2.7 10*3/uL (ref 0.7–3.1)
Lymphs: 26 %
MCH: 32.9 pg (ref 26.6–33.0)
MCHC: 34 g/dL (ref 31.5–35.7)
MCV: 97 fL (ref 79–97)
Monocytes Absolute: 0.5 10*3/uL (ref 0.1–0.9)
Monocytes: 5 %
Neutrophils Absolute: 6.8 10*3/uL (ref 1.4–7.0)
Neutrophils: 67 %
Platelets: 258 10*3/uL (ref 150–450)
RBC: 4.04 x10E6/uL — ABNORMAL LOW (ref 4.14–5.80)
RDW: 13.7 % (ref 11.6–15.4)
WBC: 10.1 10*3/uL (ref 3.4–10.8)

## 2021-11-12 LAB — COMPREHENSIVE METABOLIC PANEL
ALT: 29 IU/L (ref 0–44)
AST: 40 IU/L (ref 0–40)
Albumin/Globulin Ratio: 1.2 (ref 1.2–2.2)
Albumin: 3.5 g/dL — ABNORMAL LOW (ref 4.1–5.1)
Alkaline Phosphatase: 125 IU/L — ABNORMAL HIGH (ref 44–121)
BUN/Creatinine Ratio: 7 — ABNORMAL LOW (ref 9–20)
BUN: 6 mg/dL (ref 6–24)
Bilirubin Total: 0.3 mg/dL (ref 0.0–1.2)
CO2: 22 mmol/L (ref 20–29)
Calcium: 8.7 mg/dL (ref 8.7–10.2)
Chloride: 104 mmol/L (ref 96–106)
Creatinine, Ser: 0.84 mg/dL (ref 0.76–1.27)
Globulin, Total: 2.9 g/dL (ref 1.5–4.5)
Glucose: 77 mg/dL (ref 70–99)
Potassium: 4.8 mmol/L (ref 3.5–5.2)
Sodium: 141 mmol/L (ref 134–144)
Total Protein: 6.4 g/dL (ref 6.0–8.5)
eGFR: 108 mL/min/{1.73_m2} (ref >59)

## 2021-11-18 ENCOUNTER — Other Ambulatory Visit: Payer: Self-pay | Admitting: Family Medicine

## 2021-11-18 DIAGNOSIS — F418 Other specified anxiety disorders: Secondary | ICD-10-CM

## 2021-11-18 NOTE — Telephone Encounter (Signed)
Is this okay to refill? 

## 2021-11-21 ENCOUNTER — Other Ambulatory Visit: Payer: Self-pay | Admitting: Internal Medicine

## 2021-11-21 DIAGNOSIS — E785 Hyperlipidemia, unspecified: Secondary | ICD-10-CM

## 2021-12-01 ENCOUNTER — Encounter: Payer: Self-pay | Admitting: Internal Medicine

## 2021-12-01 ENCOUNTER — Other Ambulatory Visit: Payer: Self-pay

## 2021-12-01 ENCOUNTER — Other Ambulatory Visit (HOSPITAL_COMMUNITY): Payer: Self-pay

## 2021-12-01 MED ORDER — METOPROLOL TARTRATE 25 MG PO TABS: 12.5000 mg | ORAL_TABLET | Freq: Two times a day (BID) | ORAL | 3 refills | Status: AC

## 2021-12-15 ENCOUNTER — Encounter: Payer: Self-pay | Admitting: Internal Medicine

## 2021-12-28 ENCOUNTER — Encounter: Payer: Self-pay | Admitting: Internal Medicine

## 2022-01-04 ENCOUNTER — Other Ambulatory Visit: Payer: Self-pay | Admitting: Family Medicine

## 2022-01-04 DIAGNOSIS — F418 Other specified anxiety disorders: Secondary | ICD-10-CM

## 2022-01-05 ENCOUNTER — Encounter: Payer: Self-pay | Admitting: Internal Medicine

## 2022-01-05 ENCOUNTER — Ambulatory Visit (INDEPENDENT_AMBULATORY_CARE_PROVIDER_SITE_OTHER): Payer: Commercial Managed Care - HMO | Admitting: Internal Medicine

## 2022-01-05 VITALS — BP 136/90 | HR 95 | Ht 64.0 in | Wt 138.2 lb

## 2022-01-05 DIAGNOSIS — E119 Type 2 diabetes mellitus without complications: Secondary | ICD-10-CM

## 2022-01-05 DIAGNOSIS — E785 Hyperlipidemia, unspecified: Secondary | ICD-10-CM

## 2022-01-05 MED ORDER — BASAGLAR KWIKPEN 100 UNIT/ML ~~LOC~~ SOPN: 20.0000 [IU] | PEN_INJECTOR | Freq: Every day | SUBCUTANEOUS | 3 refills | Status: AC

## 2022-01-05 MED ORDER — GLUCAGON 3 MG/DOSE NA POWD: 3.0000 mg | Freq: Once | NASAL | 11 refills | Status: AC | PRN

## 2022-01-05 NOTE — Patient Instructions (Addendum)
Please continue: -  Basaglar 20 units at night  Change: - Humalog 15 min before meals: 7-9 units   Use the following sliding scale for Humalog: - 150-175: + 1 unit  - 176-200: + 2 units  - 201-225: + 3 units  - >220: + 4 units   Please return in 2-3 months.  PATIENT INSTRUCTIONS FOR TYPE 2 DIABETES:  DIET AND EXERCISE Diet and exercise is an important part of diabetic treatment.  We recommended aerobic exercise in the form of brisk walking (working between 40-60% of maximal aerobic capacity, similar to brisk walking) for 150 minutes per week (such as 30 minutes five days per week) along with 3 times per week performing 'resistance' training (using various gauge rubber tubes with handles) 5-10 exercises involving the major muscle groups (upper body, lower body and core) performing 10-15 repetitions (or near fatigue) each exercise. Start at half the above goal but build slowly to reach the above goals. If limited by weight, joint pain, or disability, we recommend daily walking in a swimming pool with water up to waist to reduce pressure from joints while allow for adequate exercise.    BLOOD GLUCOSES Monitoring your blood glucoses is important for continued management of your diabetes. Please check your blood glucoses 2-4 times a day: fasting, before meals and at bedtime (you can rotate these measurements - e.g. one day check before the 3 meals, the next day check before 2 of the meals and before bedtime, etc.).   HYPOGLYCEMIA (low blood sugar) Hypoglycemia is usually a reaction to not eating, exercising, or taking too much insulin/ other diabetes drugs.  Symptoms include tremors, sweating, hunger, confusion, headache, etc. Treat IMMEDIATELY with 15 grams of Carbs: 4 glucose tablets  cup regular juice/soda 2 tablespoons raisins 4 teaspoons sugar 1 tablespoon honey Recheck blood glucose in 15 mins and repeat above if still symptomatic/blood glucose <100.  RECOMMENDATIONS TO REDUCE  YOUR RISK OF DIABETIC COMPLICATIONS: * Take your prescribed MEDICATION(S) * Follow a DIABETIC diet: Complex carbs, fiber rich foods, (monounsaturated and polyunsaturated) fats * AVOID saturated/trans fats, high fat foods, >2,300 mg salt per day. * EXERCISE at least 5 times a week for 30 minutes or preferably daily.  * DO NOT SMOKE OR DRINK more than 1 drink a day. * Check your FEET every day. Do not wear tightfitting shoes. Contact us if you develop an ulcer * See your EYE doctor once a year or more if needed * Get a FLU shot once a year * Get a PNEUMONIA vaccine once before and once after age 60 years  GOALS:  * Your Hemoglobin A1c of <7%  * fasting sugars need to be 80-130 * after meals sugars need to be <180 (2h after you start eating) * Your Systolic BP should be 591 or lower  * Your Diastolic BP should be 80 or lower  * Your HDL (Good Cholesterol) should be 40 or higher  * Your LDL (Bad Cholesterol) should be ideally <70. * Your Triglycerides should be 150 or lower  * Your Urine microalbumin (kidney function) should be <30 * Your Body Mass Index should be 25 or lower   Please consider the following ways to cut down carbs and fat and increase fiber and micronutrients in your diet: - substitute whole grain for white bread or pasta - substitute brown rice for white rice - substitute 90-calorie flat bread pieces for slices of bread when possible - substitute sweet potatoes or yams for white potatoes -  substitute humus for margarine - substitute tofu for cheese when possible - substitute almond or rice milk for regular milk (would not drink soy milk daily due to concern for soy estrogen influence on breast cancer risk) - substitute dark chocolate for other sweets when possible - substitute water - can add lemon or orange slices for taste - for diet sodas (artificial sweeteners will trick your body that you can eat sweets without getting calories and will lead you to overeating and  weight gain in the long run) - do not skip breakfast or other meals (this will slow down the metabolism and will result in more weight gain over time)  - can try smoothies made from fruit and almond/rice milk in am instead of regular breakfast - can also try old-fashioned (not instant) oatmeal made with almond/rice milk in am - order the dressing on the side when eating salad at a restaurant (pour less than half of the dressing on the salad) - eat as little meat as possible - can try juicing, but should not forget that juicing will get rid of the fiber, so would alternate with eating raw veg./fruits or drinking smoothies - use as little oil as possible, even when using olive oil - can dress a salad with a mix of balsamic vinegar and lemon juice, for e.g. - use agave nectar, stevia sugar, or regular sugar rather than artificial sweateners - steam or broil/roast veggies  - snack on veggies/fruit/nuts (unsalted, preferably) when possible, rather than processed foods - reduce or eliminate aspartame in diet (it is in diet sodas, chewing gum, etc) Read the labels!  Try to read Dr. Katherina Right book: "Program for Reversing Diabetes" for other ideas for healthy eating.

## 2022-01-05 NOTE — Progress Notes (Signed)
Patient ID: [name] [name], male   DOB: 1976/01/30, 46 y.o.   MRN: [id number]  HPI: [name] [name] is a 46 y.o.-year-old male, returning for follow-up for DM2, dx in 2021, insulin-dependent since 2022, uncontrolled, with complications (CAD, CHF, ED, DKA). Pt. previously saw Dr. Loanne Drilling, last visit 6 mo ago.  Reviewed outside records [facility]):  The patient's last follow-up was with the patient's primary care physician, 11/11/21 previously patient of Dr. Loanne Drilling last seen March 2023   Other symptoms include Recent admission for DKA august 48-66TM, complicated by PNA. History of alcohol abuse with Pancreatitis, HIV + managed on Truvada. He has continued to drink but reports 3-4 drinks per week   Reviewed HbA1c: 11/30/2021: HbA1c 8.3% Lab Results  Component Value Date   HGBA1C 8.8 (A) 07/06/2021   HGBA1C 10.9 (A) 05/26/2021   HGBA1C 8.0 (A) 02/08/2020   HGBA1C 10.2 (H) 12/11/2019   HGBA1C 8.8 (A) 11/15/2019   HGBA1C 6.3 (H) 05/30/2019   HGBA1C 5.4 04/19/2014   Pt is on a regimen of: - Basaglar 20 units at bedtime - at most 1-2x a week - Humalog 7-10 >> 9-12 units 3 times a day before meals >> only taking this for correction! - 5-7 units  Pt checks his sugars >4x a day and they are:  Lowest sugar was 43; he has hypoglycemia awareness at 50s.  Highest sugar was 400.  Glucometer: One Touch Verio  Pt's meals are: - Breakfast: eggs + bacon - Lunch: home cooked or sandwiich - Dinner: meat + veggies - Snacks: carb smart icecream, no sweet drinks  - no CKD, last BUN/creatinine:  Lab Results  Component Value Date   BUN 6 11/11/2021   BUN 24 (H) 10/29/2021   CREATININE 0.84 11/11/2021   CREATININE 1.09 10/29/2021  On lisinopril 40 mg daily.  -+HL; last set of lipids: Lab Results  Component Value Date   CHOL 149 07/07/2021   HDL 51 07/07/2021   LDLCALC 81 07/07/2021   TRIG 87 07/07/2021   CHOLHDL 2.9 07/07/2021  On Lipitor 40 mg daily.  - last eye exam was  in 05/2020. No DR.   - no numbness and tingling in his feet.  Last foot exam 07/06/2021.  He also has a history of hypogonadism.  He was taken off testosterone due to polycythemia. He has a history of HTN, HIV, neurosyphilis, anxiety.  OSA was suspected, however, a sleep study was declined by his insurance. He has a history of pancreatitis x2: 2021 and 07/2021.  Also, history of alcohol use.  ROS: + see HPI No increased urination, blurry vision, nausea, chest pain.  Past Medical History:  Diagnosis Date   Anxiety    Coronary artery disease    a. 05/2014 OOH MI/PCI: LM nl, LAD 40p, 99p (3.0x28 Synergy DES) w/ R->L Collaterals, LCX nl, RCA 30p/d, EF 55% w/ sev distal inf/ant HK.   Depression    Diabetes mellitus without complication ([place])    Erectile dysfunction    H/O echocardiogram    a. 05/2014 Echo: Ef 45-50%, sev mid antsept/ant HK.   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     Pancreatitis 10/2018   Tobacco abuse    Past Surgical History:  Procedure Laterality Date   CORONARY STENT PLACEMENT  05/24/2014   MID LAD DES    LEFT HEART CATHETERIZATION WITH CORONARY ANGIOGRAM N/A 05/24/2014  Procedure: LEFT HEART CATHETERIZATION WITH CORONARY ANGIOGRAM;  Surgeon: Jettie Booze, MD;  Location: [facility] CATH LAB;  Service: Cardiovascular;  Laterality: N/A;   PERCUTANEOUS CORONARY STENT INTERVENTION (PCI-S)  05/24/2014   Procedure: PERCUTANEOUS CORONARY STENT INTERVENTION (PCI-S);  Surgeon: Jettie Booze, MD;  Location: [facility] CATH LAB;  Service: Cardiovascular;;   PILONIDAL CYST EXCISION  1995   Social History   Socioeconomic History   Marital status: Single    Spouse name: Not on file   Number of children: Not on file   Years of education: Not on file   Highest education level: Not on file  Occupational History   Occupation: Secondary school teacher  Tobacco Use   Smoking  status: Every Day    Packs/day: 1.00    Years: 23.00    Total pack years: 23.00    Types: Cigarettes   Smokeless tobacco: Never   Tobacco comments:    currently smoking 0.5 ppd, declines patch  Vaping Use   Vaping Use: Never used  Substance and Sexual Activity   Alcohol use: Yes    Alcohol/week: 24.0 standard drinks of alcohol    Types: 24 Cans of beer per week    Comment: 1/5-3/4 Fifth, 4-5 times a week.  +Guilty, cutting down; 2/4 CAGE questions.   Drug use: No   Sexual activity: Yes    Partners: Male  Other Topics Concern   Not on file  Social History Narrative   Lives at home with roommate, exercise at gym in apartment complex, diet - no discretion.       Social Determinants of Health   Financial Resource Strain: Not on file  Food Insecurity: Not on file  Transportation Needs: Not on file  Physical Activity: Not on file  Stress: Not on file  Social Connections: Not on file  Intimate Partner Violence: Not on file   Current Outpatient Medications on File Prior to Visit  Medication Sig Dispense Refill   ALPRAZolam (XANAX) 0.5 MG tablet TAKE 1 TABLET(0.5 MG) BY MOUTH DAILY AS NEEDED FOR ANXIETY 30 tablet 0   aspirin EC 81 MG EC tablet Take 1 tablet (81 mg total) by mouth daily.     atorvastatin (LIPITOR) 40 MG tablet TAKE ONE TABLET BY MOUTH DAILY AT 6PM (Patient taking differently: Take 40 mg by mouth daily.) 90 tablet 3   Blood Glucose Monitoring Suppl (ONETOUCH VERIO) w/Device KIT 1 kit by Does not apply route.     Continuous Blood Gluc Sensor (FREESTYLE LIBRE 2 SENSOR) MISC 1 Device by Does not apply route every 14 (fourteen) days. (Patient not taking: Reported on 11/11/2021) 6 each 3   emtricitabine-tenofovir (TRUVADA) 200-300 MG tablet Take 1 tablet by mouth daily.     glucose blood (ONETOUCH VERIO) test strip Use as instructed to check blood sugar once daily. 100 each 12   ibuprofen (ADVIL) 200 MG tablet Take 400 mg by mouth 3 (three) times daily as needed (pain).      Insulin Glargine (BASAGLAR KWIKPEN) 100 UNIT/ML Inject 25 Units into the skin daily. 30 mL 0   insulin lispro (HUMALOG KWIKPEN) 100 UNIT/ML KwikPen Inject 9-12 Units into the skin 3 (three) times daily with meals. And pen needles 4/day 45 mL 1   metoprolol tartrate (LOPRESSOR) 25 MG tablet Take 1/2 tablet (12.5 mg total) by mouth 2 (two) times daily. 90 tablet 3   Multiple Vitamin (MULTIVITAMIN WITH MINERALS) TABS tablet Take 1 tablet by mouth daily.     OneTouch Delica Lancets  33G MISC 1 each by Does not apply route daily. Check blood sugar once daily 100 each 3   Current Facility-Administered Medications on File Prior to Visit  Medication Dose Route Frequency Provider Last Rate Last Admin   testosterone cypionate (DEPOTESTOSTERONE CYPIONATE) injection 200 mg  200 mg Intramuscular Q14 Days Denita Lung, MD   200 mg at 01/17/20 0941   Allergies  Allergen Reactions   Gadolinium Derivatives Nausea And Vomiting    MRI dye   Family History  Problem Relation Age of Onset   Thyroid disease Mother    Heart disease Father 67       MI, stents   Peripheral Artery Disease Father    Diabetes type II Father    Diabetes Father    Colon cancer Father    Diabetes Maternal Grandmother    Hypertension Maternal Grandfather    Hypertension Paternal Grandfather    Cancer Neg Hx    Stroke Neg Hx    Other Neg Hx        hypogonadism   Pancreatic cancer Neg Hx    Esophageal cancer Neg Hx    PE: BP (!) 136/90 (BP Location: Right Arm, Patient Position: Sitting, Cuff Size: Normal)   Pulse 95   Ht 5' 4" (1.626 m)   Wt 138 lb 3.2 oz (62.7 kg)   SpO2 96%   BMI 23.72 kg/m  Wt Readings from Last 3 Encounters:  01/05/22 138 lb 3.2 oz (62.7 kg)  11/11/21 129 lb 12.8 oz (58.9 kg)  11/11/21 128 lb 12.8 oz (58.4 kg)   Constitutional: overweight, in NAD Eyes:  EOMI, no exophthalmos ENT: no neck masses, no cervical lymphadenopathy Cardiovascular: Tachycardia,  RR, No MRG Respiratory: CTA  B Musculoskeletal: no deformities Skin:no rashes Neurological: + tremor with outstretched hands  ASSESSMENT: 1. DM2, non-insulin-dependent, uncontrolled, with complications: - DKA 1/60/1093 - CAD, s/p AMI 2016, s/p stent - CHF - ED  2. HL  PLAN:  1. Patient with long-standing, uncontrolled diabetes, on basal/bolus insulin regimen, with still poor control.  HbA1c obtained 6 months ago was 8.8%, lower, but still above target.  He had another HbA1c obtained last month and this was improved further, to 8.2%. CGM interpretation: -At today's visit, we reviewed his CGM downloads: It appears that 50% of values are in target range (goal >70%), while 44% are higher than 180 (goal <25%), and 5% are lower than 70 (goal <4%).  The calculated average blood sugar is 179.  The projected HbA1c for the next 3 months (GMI) is 7.6%. -Reviewing the CGM trends, sugars appear to be improving overnight, but they increase after breakfast, and then after every subsequent meal, with more pronounced peaks after lunch and in the middle of the night.  Upon questioning, she is not taking Basaglar quite every day, missing 1-2 doses a week.  We discussed that this is conducive to poor control and I advised him to possibly set alarms on his phone to remember to take the insulin.  Also, upon questioning, he is not taking Humalog before meals, but states it after the sugars already increase after meals.  We discussed that this is conducive to high blood sugars immediately after meals and low blood sugars later afterwards.  Indeed, he does have some low blood sugars and this was another reason why he was reticent to take insulin before meals.  We discussed about correct intake of Humalog 15 minutes before each meal, unless his sugars are low, in which case he  needs to correct the low first and then inject and eat.  He agrees to start doing so.  I also gave him a sliding scale Humalog insulin, mostly to correct high blood sugars after  meals, possibly also before meals.  He is now correcting with after 7 units, which is too much for him. -Based on the variability in his blood sugars, my suspicion is that he has type 1 diabetes.  I do not see a C-peptide or pancreatic antibodies in the chart so he plans to do this at today's visit, but he just ate lunch and his sugars were increasing, already in the 180s at the time of the visit.  We will recheck these at next visit.  However, we are treating him as for type 1 diabetes right now. - I suggested to:  Patient Instructions  Please continue: -  Basaglar 20 units at night  Change: - Humalog 15 min before meals: 7-9 units   Use the following sliding scale for Humalog: - 150-175: + 1 unit  - 176-200: + 2 units  - 201-225: + 3 units  - >220: + 4 units   Please return in 2-3 months.  - check sugars at different times of the day - check 3-4x a day, rotating checks - discussed about CBG targets for treatment: 80-130 mg/dL before meals and <180 mg/dL after meals; target HbA1c <7%. - given foot care handout  - given instructions for hypoglycemia management "15-15 rule"  - advised for yearly eye exams - he is up-to-date - Return to clinic in 3 mo  2. HL - Reviewed latest lipid panel from 6 months ago: Fractions at goal with the exception of an LDL slightly higher than goal: Lab Results  Component Value Date   CHOL 149 07/07/2021   HDL 51 07/07/2021   LDLCALC 81 07/07/2021   TRIG 87 07/07/2021   CHOLHDL 2.9 07/07/2021  - Continues Lipitor 40 mg daily (recently started) without side effects.  - Total time spent for the visit: 40 min, in precharting, reviewing Dr. Cordelia Pen last note, reviewing outside records, obtaining medical information from the pt, reviewing his previous labs, evaluations, and treatments, counseling him about his diabetes (please see the discussed topics above), and developing a plan to further investigate and treat it; he had a number of questions which I  addressed.  Philemon Kingdom, MD PhD [facility]

## 2022-01-14 ENCOUNTER — Encounter: Payer: Self-pay | Admitting: Family Medicine

## 2022-01-14 DIAGNOSIS — R29898 Other symptoms and signs involving the musculoskeletal system: Secondary | ICD-10-CM

## 2022-01-15 ENCOUNTER — Encounter: Payer: Self-pay | Admitting: Internal Medicine

## 2022-01-15 DIAGNOSIS — E119 Type 2 diabetes mellitus without complications: Secondary | ICD-10-CM

## 2022-01-15 MED ORDER — RELION TRUE METRIX TEST STRIPS VI STRP: ORAL_STRIP | 12 refills | Status: DC

## 2022-01-15 MED ORDER — FREESTYLE LIBRE 3 SENSOR MISC: 1.0000 | 3 refills | Status: AC

## 2022-02-02 ENCOUNTER — Encounter: Payer: Self-pay | Admitting: Neurology

## 2022-02-15 ENCOUNTER — Encounter: Payer: Self-pay | Admitting: Internal Medicine

## 2022-02-15 DIAGNOSIS — E119 Type 2 diabetes mellitus without complications: Secondary | ICD-10-CM

## 2022-02-16 MED ORDER — HUMALOG KWIKPEN 200 UNIT/ML ~~LOC~~ SOPN: 9.0000 [IU] | PEN_INJECTOR | Freq: Three times a day (TID) | SUBCUTANEOUS | 0 refills | Status: AC

## 2022-02-16 MED ORDER — PEN NEEDLES 32G X 4 MM MISC: 0 refills | Status: AC

## 2022-03-17 ENCOUNTER — Ambulatory Visit: Payer: Commercial Managed Care - HMO

## 2022-03-18 ENCOUNTER — Ambulatory Visit: Payer: Commercial Managed Care - HMO

## 2022-03-19 ENCOUNTER — Ambulatory Visit
Admission: RE | Admit: 2022-03-19 | Discharge: 2022-03-19 | Disposition: A | Discharge status: Home / Self Care | Payer: Commercial Managed Care - HMO | Source: Ambulatory Visit | Attending: Emergency Medicine | Admitting: Emergency Medicine

## 2022-03-19 ENCOUNTER — Ambulatory Visit: Payer: Commercial Managed Care - HMO | Admitting: Neurology

## 2022-03-19 VITALS — BP 122/68 | HR 105 | Temp 99.5°F | Resp 16

## 2022-03-19 DIAGNOSIS — J4521 Mild intermittent asthma with (acute) exacerbation: Secondary | ICD-10-CM

## 2022-03-19 DIAGNOSIS — M722 Plantar fascial fibromatosis: Secondary | ICD-10-CM | POA: Diagnosis not present

## 2022-03-19 DIAGNOSIS — W57XXXA Bitten or stung by nonvenomous insect and other nonvenomous arthropods, initial encounter: Secondary | ICD-10-CM | POA: Diagnosis not present

## 2022-03-19 MED ORDER — CETIRIZINE HCL 10 MG PO TABS: 10.0000 mg | ORAL_TABLET | Freq: Every day | ORAL | 1 refills | Status: DC

## 2022-03-19 MED ORDER — ALBUTEROL SULFATE HFA 108 (90 BASE) MCG/ACT IN AERS: 2.0000 | INHALATION_SPRAY | Freq: Four times a day (QID) | RESPIRATORY_TRACT | 0 refills | Status: DC | PRN

## 2022-03-19 MED ORDER — METHYLPREDNISOLONE 4 MG PO TBPK: ORAL_TABLET | ORAL | 0 refills | Status: DC

## 2022-03-19 NOTE — ED Triage Notes (Addendum)
Pt reports having intermittent pain to left and right foot arch. Started: a few weeks ago  Home interventions: motrin  The patient states he might have been exposed to bed bugs since November. The patient states he has not visible seen any bedbugs but has noticed marks on his body (bug bites).  The patient reports having congestion and nasal drainage.

## 2022-03-19 NOTE — ED Provider Notes (Signed)
[facility]    CSN: [id number] Arrival date & time: 03/19/22  0857    HISTORY   Chief Complaint  Patient presents with   bed bugs   Foot Pain   Nasal Congestion   HPI [name] [name] is a pleasant, 47 y.o. male who presents to urgent care today. Pt reports having intermittent pain to left and right foot arch.  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  Patient states he is tried Motrin which provides temporary relief.   The patient also states he might have been exposed to bed bugs since November. The patient states he has not visible seen any bedbugs but has noticed marks on his body (bug bites).  Patient states he has not had any houseguests and has not visited anyone else.  Patient states he is limited in his current location for about 9 years and ends his own furniture.  Patient states his apartment is 1 of 4 and a larger home that has been divided into apartments.   The patient reports having congestion and nasal drainage as well as cough.  Patient has a O2 sat of 95% on arrival today.  Patient states this has been going on for several weeks as well.  Patient denies history of allergies and asthma.   The history is provided by the patient.   Past Medical History:  Diagnosis Date   Anxiety    Coronary artery disease    a. 05/2014 OOH MI/PCI: LM nl, LAD 40p, 99p (3.0x28 Synergy DES) w/ R->L Collaterals, LCX nl, RCA 30p/d, EF 55% w/ sev distal inf/ant HK.   Depression    Diabetes mellitus without complication ([place])    Erectile dysfunction    H/O echocardiogram    a. 05/2014 Echo: Ef 45-50%, sev mid antsept/ant HK.   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     Pancreatitis 10/2018   Tobacco abuse    Patient Active Problem List   Diagnosis Date Noted    Community acquired pneumonia 10/29/2021   AKI (acute kidney injury) ([place]) 95/18/8416   Acute metabolic encephalopathy 60/63/0160   DKA (diabetic ketoacidosis) ([place]) 10/28/2021   Hypertensive urgency 07/07/2021   HIV (human immunodeficiency virus infection) ([place]) 07/07/2021   Alcohol use disorder, mild, in early remission 04/01/2020   Diabetes ([place]) 02/08/2020   Hypogonadism in male 12/26/2019   Pancreatitis 12/09/2019   Pancreatic pseudocyst 12/09/2019   History of pancreatitis 05/30/2019   Anxiety 05/30/2019   Chest pain 11/10/2017   Food allergy 12/24/2015   Hyperlipidemia    Hypertension    CAD (coronary artery disease), native coronary artery 05/24/2014   Family history of premature CAD 05/08/2014   History of syphilis    High risk sexual behavior    Tobacco use disorder    Past Surgical History:  Procedure Laterality Date   CORONARY STENT PLACEMENT  05/24/2014   MID LAD DES    LEFT HEART CATHETERIZATION WITH CORONARY ANGIOGRAM N/A 05/24/2014   Procedure: LEFT HEART CATHETERIZATION WITH CORONARY ANGIOGRAM;  Surgeon: Jettie Booze, MD;  [facility] C[facility] LAB;  Service: Cardiovascular;  Laterality: N/A;   PERCUTANEOUS CORONARY STENT INTERVENTION (PCI-S)  05/24/2014   Procedure: PERCUTANEOUS CORONARY STENT INTERVENTION (PCI-S);  Surgeon: Jettie Booze,  MD;  Location: [facility] CATH LAB;  Service: Cardiovascular;;   PILONIDAL CYST EXCISION  1995    Home Medications    Prior to Admission medications   Medication Sig Start Date End Date Taking? Authorizing Provider  ALPRAZolam Prudy Feeler) 0.5 MG tablet TAKE 1 TABLET(0.5 MG) BY MOUTH DAILY AS NEEDED FOR ANXIETY 01/04/22   Ronnald Nian, MD  aspirin EC 81 MG EC tablet Take 1 tablet (81 mg total) by mouth daily. 05/25/14   Creig Hines, NP  atorvastatin (LIPITOR) 40 MG tablet TAKE ONE TABLET BY MOUTH DAILY AT 6PM Patient taking differently: Take 40 mg by mouth daily. 06/03/21   Parke Poisson, MD  Continuous  Blood Gluc Sensor (FREESTYLE LIBRE 3 SENSOR) MISC 1 applicator by Does not apply route every 14 (fourteen) days. 01/15/22   Carlus Pavlov, MD  emtricitabine-tenofovir (TRUVADA) 200-300 MG tablet Take 1 tablet by mouth daily. 11/13/19   [provider]  Glucagon 3 MG/DOSE POWD Place 3 mg into the nose once as needed for up to 1 dose. 01/05/22   Carlus Pavlov, MD  glucose blood (RELION TRUE METRIX TEST STRIPS) test strip Use as instructed 01/15/22   Carlus Pavlov, MD  ibuprofen (ADVIL) 200 MG tablet Take 400 mg by mouth 3 (three) times daily as needed (pain).    [provider]  Insulin Glargine (BASAGLAR KWIKPEN) 100 UNIT/ML Inject 20 Units into the skin daily. 01/05/22   Carlus Pavlov, MD  insulin lispro (HUMALOG KWIKPEN) 200 UNIT/ML KwikPen Inject 9-12 Units into the skin 3 (three) times daily before meals. 02/16/22   Carlus Pavlov, MD  Insulin Pen Needle (PEN NEEDLES) 32G X 4 MM MISC Use as instructed to administer insulin 4X daily 02/16/22   Carlus Pavlov, MD  metoprolol tartrate (LOPRESSOR) 25 MG tablet Take 1/2 tablet (12.5 mg total) by mouth 2 (two) times daily. 12/01/21   Joylene Grapes, NP  Multiple Vitamin (MULTIVITAMIN WITH MINERALS) TABS tablet Take 1 tablet by mouth daily.    [provider]    Family History Family History  Problem Relation Age of Onset   Thyroid disease Mother    Heart disease Father 18       MI, stents   Peripheral Artery Disease Father    Diabetes type II Father    Diabetes Father    Colon cancer Father    Diabetes Maternal Grandmother    Hypertension Maternal Grandfather    Hypertension Paternal Grandfather    Cancer Neg Hx    Stroke Neg Hx    Other Neg Hx        hypogonadism   Pancreatic cancer Neg Hx    Esophageal cancer Neg Hx    Social History Social History   Tobacco Use   Smoking status: Every Day    Packs/day: 1.00    Years: 23.00    Total pack years: 23.00    Types: Cigarettes    Smokeless tobacco: Never   Tobacco comments:    currently smoking 0.5 ppd, declines patch  Vaping Use   Vaping Use: Never used  Substance Use Topics   Alcohol use: Yes    Alcohol/week: 24.0 standard drinks of alcohol    Types: 24 Cans of beer per week    Comment: 1/5-3/4 Fifth, 4-5 times a week.  +Guilty, cutting down; 2/4 CAGE questions.   Drug use: No   Allergies   Gadolinium derivatives  Review of Systems Review of Systems Pertinent findings revealed after performing a 14 point  review of systems has been noted in the history of present illness.  Physical Exam Triage Vital Signs ED Triage Vitals  Enc Vitals Group     BP 01/02/21 0827 (!) 147/82     Pulse Rate 01/02/21 0827 72     Resp 01/02/21 0827 18     Temp 01/02/21 0827 98.3 F (36.8 C)     Temp Source 01/02/21 0827 Oral     SpO2 01/02/21 0827 98 %     Weight --      Height --      Head Circumference --      Peak Flow --      Pain Score 01/02/21 0826 5     Pain Loc --      Pain Edu? --      Excl. in GC? --   No data found.  Updated Vital Signs BP 122/68 (BP Location: Left Arm)   Pulse (!) 105   Temp 99.5 F (37.5 C) (Oral)   Resp 16   SpO2 95%   Physical Exam Vitals and nursing note reviewed.  Constitutional:      General: He is not in acute distress.    Appearance: Normal appearance. He is not ill-appearing.  HENT:     Head: Normocephalic and atraumatic.     Right Ear: Hearing, ear canal and external ear normal. A middle ear effusion is present. Tympanic membrane is bulging. Tympanic membrane is not injected or erythematous.     Left Ear: Hearing, ear canal and external ear normal. A middle ear effusion is present. Tympanic membrane is bulging. Tympanic membrane is not injected or erythematous.     Ears:     Comments: Bilateral EACs normal, both TMs bulging with clear fluid    Nose: Rhinorrhea present. No nasal deformity, septal deviation, signs of injury or nasal tenderness. Rhinorrhea is clear.      Right Nostril: Occlusion present. No foreign body, epistaxis or septal hematoma.     Left Nostril: Occlusion present. No foreign body, epistaxis or septal hematoma.     Right Turbinates: Enlarged, swollen and pale.     Left Turbinates: Enlarged, swollen and pale.     Right Sinus: No maxillary sinus tenderness or frontal sinus tenderness.     Left Sinus: No maxillary sinus tenderness or frontal sinus tenderness.     Mouth/Throat:     Comments: Postnasal drip Eyes:     General: Lids are normal.        Right eye: No discharge.        Left eye: No discharge.     Extraocular Movements: Extraocular movements intact.     Conjunctiva/sclera: Conjunctivae normal.     Right eye: Right conjunctiva is not injected.     Left eye: Left conjunctiva is not injected.  Neck:     Trachea: Trachea and phonation normal. No tracheal tenderness.  Cardiovascular:     Rate and Rhythm: Normal rate and regular rhythm.     Pulses: Normal pulses.     Heart sounds: Normal heart sounds. No murmur heard.    No friction rub. No gallop.  Pulmonary:     Effort: Pulmonary effort is normal. No tachypnea, bradypnea, accessory muscle usage, prolonged expiration, respiratory distress or retractions.     Breath sounds: Examination of the right-upper field reveals wheezing. Examination of the left-upper field reveals wheezing. Examination of the right-middle field reveals wheezing. Examination of the left-middle field reveals wheezing. Wheezing present.  Chest:  Chest wall: No tenderness.  Abdominal:     General: Abdomen is flat. Bowel sounds are normal.     Palpations: Abdomen is soft.     Tenderness: There is no abdominal tenderness.     Hernia: No hernia is present.       Comments: TNTC macular papular lesions with scale without surrounding erythema, induration or drainage.  Musculoskeletal:        General: Normal range of motion.     Cervical back: Normal range of motion and neck supple. No edema, erythema,  rigidity or crepitus. No pain with movement. Normal range of motion.     Right lower leg: No edema.     Left lower leg: No edema.     Right foot: Normal range of motion. No deformity, bunion, Charcot foot, foot drop or prominent metatarsal heads.     Left foot: No deformity, bunion, Charcot foot, foot drop or prominent metatarsal heads.  Feet:     Right foot:     Skin integrity: Skin integrity normal.     Left foot:     Skin integrity: Skin integrity normal.     Comments: Tenderness to palpation at arches bilaterally Lymphadenopathy:     Cervical: No cervical adenopathy.  Skin:    General: Skin is warm and dry.     Findings: No erythema or rash.     Comments: Skin is warm and dry to touch, good turgor, no rash appreciated  Neurological:     General: No focal deficit present.     Mental Status: He is alert and oriented to person, place, and time.     Motor: Motor function is intact.     Coordination: Coordination is intact.     Gait: Gait is intact.     Deep Tendon Reflexes:     Reflex Scores:      Patellar reflexes are 2+ on the right side and 2+ on the left side. Psychiatric:        Attention and Perception: Attention and perception normal.        Mood and Affect: Mood normal.        Speech: Speech normal.        Behavior: Behavior normal. Behavior is cooperative.        Thought Content: Thought content normal.        Cognition and Memory: Cognition normal.        Judgment: Judgment normal.     Visual Acuity Right Eye Distance:   Left Eye Distance:   Bilateral Distance:    Right Eye Near:   Left Eye Near:    Bilateral Near:     UC Couse / Diagnostics / Procedures:     Radiology No results found.  Procedures Procedures (including critical care time) EKG  Pending results:  Labs Reviewed - No data to display  Medications Ordered in UC: Medications - No data to display  UC Diagnoses / Final Clinical Impressions(s)   I have reviewed the triage vital signs  and the nursing notes.  Pertinent labs & imaging results that were available during my care of the patient were reviewed by me and considered in my medical decision making (see chart for details).    Final diagnoses:  Bedbug bite, initial encounter  Mild intermittent reactive airway disease with acute exacerbation  Plantar fasciitis, bilateral   Patient with information regarding management of plantar fasciitis and bedbugs at home.  Patient encouraged to reach out to his landlord discussed the  possibility of him having bedbugs and how to deal with it.  Patient provided with tapering dose of methylprednisolone for plan fasciitis as well as wheezing appreciated on his lung exam.  Patient provided with albuterol and Zyrtec for upper respiratory symptoms.  Patient advised to purchase arch support inserts for his shoes.  Please see discharge instructions below for details of plan of care as provided to patient. ED Prescriptions     Medication Sig Dispense Auth. Provider   methylPREDNISolone (MEDROL DOSEPAK) 4 MG TBPK tablet Take 24 mg on day 1, 20 mg on day 2, 16 mg on day 3, 12 mg on day 4, 8 mg on day 5, 4 mg on day 6.  Take all tablets in each row at once, do not spread tablets out throughout the day. 21 tablet Theadora Rama Scales, PA-C   albuterol (VENTOLIN HFA) 108 (90 Base) MCG/ACT inhaler Inhale 2 puffs into the lungs every 6 (six) hours as needed for wheezing or shortness of breath (Cough). 36 g Theadora Rama Scales, PA-C   cetirizine (ZYRTEC ALLERGY) 10 MG tablet Take 1 tablet (10 mg total) by mouth at bedtime. 90 tablet Theadora Rama Scales, [place]      PDMP not reviewed this encounter.  Pending results:  Labs Reviewed - No data to display  Discharge Instructions:   Discharge Instructions      I have included some information about plantar fasciitis and bedbug bites that I hope you find helpful.  For your wheezing, recommend that you begin a short course of steroids and  albuterol as prescribed.  I have also sent a prescription for an antihistamine to help calm your skin.  Please follow-up with your primary care provider if her symptoms do not resolve.  Thank you for visiting urgent care today.      Disposition Upon Discharge:  Condition: stable for discharge home  Patient presented with an acute illness with associated systemic symptoms and significant discomfort requiring urgent management. In my opinion, this is a condition that a prudent lay person (someone who possesses an average knowledge of health and medicine) may potentially expect to result in complications if not addressed urgently such as respiratory distress, impairment of bodily function or dysfunction of bodily organs.   Routine symptom specific, illness specific and/or disease specific instructions were discussed with the patient and/or caregiver at length.   As such, the patient has been evaluated and assessed, work-up was performed and treatment was provided in alignment with urgent care protocols and evidence based medicine.  Patient/parent/caregiver has been advised that the patient may require follow up for further testing and treatment if the symptoms continue in spite of treatment, as clinically indicated and appropriate.  Patient/parent/caregiver has been advised to return to the [facility] or PCP if no better; to PCP or the Emergency Department if new signs and symptoms develop, or if the current signs or symptoms continue to change or worsen for further workup, evaluation and treatment as clinically indicated and appropriate  The patient will follow up with their current PCP if and as advised. If the patient does not currently have a PCP we will assist them in obtaining one.   The patient may need specialty follow up if the symptoms continue, in spite of conservative treatment and management, for further workup, evaluation, consultation and treatment as clinically indicated and  appropriate.  Patient/parent/caregiver verbalized understanding and agreement of plan as discussed.  All questions were addressed during visit.  Please see discharge instructions below for further  details of plan.  This office note has been dictated using Teaching laboratory technician.  Unfortunately, this method of dictation can sometimes lead to typographical or grammatical errors.  I apologize for your inconvenience in advance if this occurs.  Please do not hesitate to reach out to me if clarification is needed.      Theadora Rama Scales, [place] 03/19/22 [phone]

## 2022-03-19 NOTE — Discharge Instructions (Addendum)
I have included some information about plantar fasciitis and bedbug bites that I hope you find helpful.  For your wheezing, recommend that you begin a short course of steroids and albuterol as prescribed.  I have also sent a prescription for an antihistamine to help calm your skin.  Please follow-up with your primary care provider if her symptoms do not resolve.  Thank you for visiting urgent care today.

## 2022-03-29 ENCOUNTER — Ambulatory Visit: Payer: Commercial Managed Care - HMO | Admitting: Neurology

## 2022-04-01 ENCOUNTER — Telehealth: Payer: Self-pay | Admitting: Family Medicine

## 2022-04-01 NOTE — Telephone Encounter (Signed)
Pt called and is requesting a refill for a medrol dosepak states he got one a the urgent care for his plantar fasciitis, state he is having a hard time with this and is not able to walk and his legs was becoming weak due to the pain, he said if you could look at his urgent care notes for him, I tried to advise him he would need at least a virtual appt pt declined said he was out of work and could not afford another bill at this moment,   Pt uses  [facility] [place] [facility] [place]

## 2022-04-02 ENCOUNTER — Telehealth: Payer: Self-pay | Admitting: Family Medicine

## 2022-04-02 NOTE — Telephone Encounter (Signed)
Pt states he is feeling some better today & gave him JCL suggestions & advised of cash pay discount .  He will let us know if worsens and needs to be seen

## 2022-04-02 NOTE — Telephone Encounter (Signed)
Left a message for pt

## 2022-04-08 ENCOUNTER — Ambulatory Visit: Payer: Commercial Managed Care - HMO | Admitting: Internal Medicine

## 2022-04-13 ENCOUNTER — Ambulatory Visit: Payer: Commercial Managed Care - HMO | Admitting: Neurology

## 2022-04-14 ENCOUNTER — Ambulatory Visit: Payer: Commercial Managed Care - HMO | Admitting: Family Medicine

## 2022-04-15 ENCOUNTER — Telehealth: Payer: Self-pay | Admitting: Internal Medicine

## 2022-04-15 ENCOUNTER — Ambulatory Visit (INDEPENDENT_AMBULATORY_CARE_PROVIDER_SITE_OTHER): Payer: Commercial Managed Care - HMO | Admitting: Family Medicine

## 2022-04-15 ENCOUNTER — Telehealth: Payer: Self-pay | Admitting: Family Medicine

## 2022-04-15 ENCOUNTER — Other Ambulatory Visit: Payer: Self-pay

## 2022-04-15 ENCOUNTER — Encounter: Payer: Self-pay | Admitting: Internal Medicine

## 2022-04-15 VITALS — BP 124/80 | HR 101 | Temp 98.3°F | Resp 20 | Wt 124.0 lb

## 2022-04-15 DIAGNOSIS — M7989 Other specified soft tissue disorders: Secondary | ICD-10-CM | POA: Diagnosis not present

## 2022-04-15 DIAGNOSIS — B853 Phthiriasis: Secondary | ICD-10-CM

## 2022-04-15 DIAGNOSIS — F418 Other specified anxiety disorders: Secondary | ICD-10-CM

## 2022-04-15 DIAGNOSIS — E119 Type 2 diabetes mellitus without complications: Secondary | ICD-10-CM

## 2022-04-15 MED ORDER — RELION TRUE METRIX TEST STRIPS VI STRP: ORAL_STRIP | 12 refills | Status: DC

## 2022-04-15 NOTE — Telephone Encounter (Signed)
Informed pt of Dr. Delphina Cahill reply: "Can order LE DVT study if patient or PCP wants." Pt stated he will let PCP know  and then he will contact our office if he wants one ordered. Pt very grateful for the call.

## 2022-04-15 NOTE — Progress Notes (Signed)
   Subjective:    Patient ID: [name] [name], male    DOB: 07/20/1975, 47 y.o.   MRN: [id number]  HPI He is here for consult concerning swelling of the left leg.  He has no history of injury.  His last long car ride was in late December to [place] to see his parents.  He has no chest pain, shortness of breath.  His medications were reviewed.  He does see endocrinology and is scheduled to see neurology. He was recently seen in an urgent care center and treated for lice but subsequently it appeared to be more crab related.  He was given over-the-counter medication to use and recently given permethrin to be used next week if there is any question about cure. He would also like glucose test strips called down. Then asked to be on a higher dose of Xanax.  Presently he is on .5 mg.  He thinks he needs a higher dose to help with his anxiety.   Review of Systems     Objective:   Physical Exam Alert and in no distress.  Breathing pattern is normal.  Left leg is edematous.  Bevelyn Buckles' sign is negative.  He is not hot or tender.  Brief exam of the skin did not show any crabs visible.       Assessment & Plan:  Left leg swelling  Lice infestation, crab Order placed for Doppler study to evaluate for DVT. Treatment of the infestation force the need for him to use another preparation about a week, apply it from the neck the entire evening. If he needs a higher dose of the medication, recommendation would be to get counseling to deal more effectively with the underlying anxiety rather than covering.

## 2022-04-15 NOTE — Telephone Encounter (Signed)
Maxton called in and is requesting a prescription for a boot for his foot/leg. He is also requesting a refill on xanx to  [facility] [place]

## 2022-04-15 NOTE — Telephone Encounter (Signed)
Pt sent this via MyChart to the scheduling pool:      1. Swelling is located.left side foot, ankle, calf, and thigh. A general new thickness or Dense bloating that won't go away as much as massage, stretch, calf compression socks, legs above heart relaxation, and epsom salts soaks as I have done. 2. Just checked.11.5 lbs down from my comfortable body weight.  (124lbs currently) I suffered from Sepsis ( statistically only a 60% chance of living once a patient gets diagnosed with sepsis)I was  diagnosed in I believe in October of 2022.  Please contact [name] out of [place], [place] to get those Medical records or let me know if I need to request them myself.  I want that time to be put in on my [facility] medical history fully. My home town medical group!!! (Btw, there was a significant weight loss that had occurred 6-8 months before I was admitted, an avg weight of 116 :(, I never missed a meal! But the intense antibiotics those 3 months, brought me back to my healthy weight.:)))     3. No   4. No   5. I don't know.can you look at my medication list and let me know? One of my cardiologists, in my heart history, described my heart health prescriptions were a trio of prescriptions to keep my heart as healthy as can be. I know there is 1 for lowered cholesterol N maintenance and one that was described 2 me as keeping water away from my heart. And the 3rd I gathered to loosen my arteries for freer blood flow in general. *see current prescription list and let me know  6. IDK what SOB means    7. see my original msg 1/12 UC visit/[facility]!!!    Good Morning [name] [name],  Can you tell me a little more about your swelling?    If swelling, where is the swelling located?   How much weight have you gained and in what time span?   Have you gained 3 pounds in a day or 5 pounds in a week?   Do you have a log of your daily weights (if so, list)?   Are you currently taking a fluid pill?   Are you  currently SOB?   Have you traveled recently?       Appointment Request From: [name] [name]  With Provider: Lenna Sciara, NP [facility] [place]  Preferred Date Range: Any  Preferred Times: Any Time  Reason for visit: Office Visit  Comments: Around the 6 month or year-mark, from my last visit. Whatever the standard is.   I've had my legs and feet been swollen or retaining for around 2 weeks.  I don't know if needs cardio or endo attention, it happened after I suffered severe pain from intense fallen arches last year went to urgent care, got prescribed steroids, and compression hose.  Much better :) but can't fit in any shoes because of my feet being swollen.

## 2022-04-15 NOTE — Telephone Encounter (Signed)
Pt stated that for the past 2 weeks, he has had edema in left leg and it is painful to walk. The right leg is beginning to swell. Over the past few months, he has lost 12 pounds and thinks it's from "losing muscle mass". He has been "immobile for the past 2 months". He began to cry because he wants to be healthy. Emotional support given. He denies SOB, dizziness, lightheadedness. Upon reviewing meds list, he stated he is on lisinopril 20mg  daily. He does not check BP/P at home. He is going to PCP today. Recommended PCP office update his medication list.

## 2022-04-16 ENCOUNTER — Encounter: Payer: Self-pay | Admitting: Neurology

## 2022-04-16 ENCOUNTER — Encounter: Payer: Self-pay | Admitting: Family Medicine

## 2022-04-20 NOTE — Telephone Encounter (Signed)
Let's discuss.

## 2022-04-21 NOTE — Telephone Encounter (Signed)
Tried calling patient. Left message to call back. 

## 2022-04-22 ENCOUNTER — Telehealth: Payer: Self-pay | Admitting: Family Medicine

## 2022-04-22 ENCOUNTER — Ambulatory Visit: Payer: Commercial Managed Care - HMO | Admitting: Internal Medicine

## 2022-04-22 NOTE — Telephone Encounter (Signed)
[name] [name] called you back, he said he missed your call yesterday

## 2022-04-22 NOTE — Telephone Encounter (Signed)
Patient advised of message below. He says he wants a prescription for the 24m dose of Xanax. He likes to cut them in 1/4 tablets. Patient is unsure which pharmacy he wants the prescription sent to. He plans to send a mPharmacist, communitymessage with the pharmacy that he wants to use.

## 2022-04-22 NOTE — Telephone Encounter (Signed)
Patient would like to speak with someone in medical records about having his records transferred to another practice.

## 2022-04-22 NOTE — Telephone Encounter (Signed)
Patient advised. Patient would like to speak with someone in medical records about having his records transferred to another practice.

## 2022-04-26 NOTE — Progress Notes (Unsigned)
NEUROLOGY CONSULTATION NOTE  [name] [name] MRN: [id number] DOB: January 07, 1976  Referring provider: Jill Alexanders, MD Primary care provider: No PCP  Reason for consult:  fine motor impairment  Assessment/Plan:   Left upper extremity dysfunction.  Unclear etiology.  He describes difficulty making his hand or arm move.  He also describes non-purposeful movements (whether choreiform vs alien limb) and occasional spasms/contractions of his hand.  He does not exhibit any objective abnormalities on neurologic exam.    We will check MRI of brain with and without contrast to evaluate for any intracranial etiology for his symptoms.  Further recommendations pending results.     Subjective:  [name] [name] is a 47 year old left-handed male with CAD, DM II, HTN, HLD, tobacco abuse, alcohol use disorder, depression and history of secondary syphilis (2016) and pancreatitis who presents for fine motor impairment.  History supplemented by referring provider's note.  MRI of brain from 2016 personally reviewed.  About a year ago, he began having trouble using his left upper extremity.  Sometimes his left arm will appear to be moving independent to thought.  When he wants to use his left hand or arm, he has to concentrate in order to make it move.  This causes difficulty performing everyday tasks such as shaving, brushing his teeth, combing his hair, writing or using utensils.  On occasion, his left hand will cramp up, sometimes painful.  It occurs spontaneously and not triggered by particular movement or position.  No obvious muscle weakness or numbness.  More recently, he thinks he is starting to have involvement of his right hand as well.  No involvement of his legs.    Of note, he was diagnosed with secondary syphilis in February 2016.  He was admitted to the hospital for left sided tinnitus.  Due to this symptom, there was concern for neurosyphilis.  At that time, he had an MRI of brain with and without  contrast which was unremarkable.  However, he declined lumbar puncture.  He was started empirically on Rocephin IV and then Truvada.      PAST MEDICAL HISTORY: Past Medical History:  Diagnosis Date   Anxiety    Coronary artery disease    a. 05/2014 OOH MI/PCI: LM nl, LAD 40p, 99p (3.0x28 Synergy DES) w/ R->L Collaterals, LCX nl, RCA 30p/d, EF 55% w/ sev distal inf/ant HK.   Depression    Diabetes mellitus without complication ([place])    Erectile dysfunction    H/O echocardiogram    a. 05/2014 Echo: Ef 45-50%, sev mid antsept/ant HK.   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     Pancreatitis 10/2018   Tobacco abuse     PAST SURGICAL HISTORY: Past Surgical History:  Procedure Laterality Date   CORONARY STENT PLACEMENT  05/24/2014   MID LAD DES    LEFT HEART CATHETERIZATION WITH CORONARY ANGIOGRAM N/A 05/24/2014   Procedure: LEFT HEART CATHETERIZATION WITH CORONARY ANGIOGRAM;  Surgeon: Jettie Booze, MD;  Location: [facility] CATH LAB;  Service: Cardiovascular;  Laterality: N/A;   PERCUTANEOUS CORONARY STENT INTERVENTION (PCI-S)  05/24/2014   Procedure: PERCUTANEOUS CORONARY STENT INTERVENTION (PCI-S);  Surgeon: Jettie Booze, MD;  Location: [facility] CATH LAB;  Service: Cardiovascular;;   PILONIDAL CYST EXCISION  1995    MEDICATIONS: Current Outpatient Medications on File Prior to Visit  Medication Sig Dispense Refill   albuterol (VENTOLIN HFA) 108 (  90 Base) MCG/ACT inhaler Inhale 2 puffs into the lungs every 6 (six) hours as needed for wheezing or shortness of breath (Cough). 36 g 0   ALPRAZolam (XANAX) 0.5 MG tablet TAKE 1 TABLET(0.5 MG) BY MOUTH DAILY AS NEEDED FOR ANXIETY 30 tablet 0   aspirin EC 81 MG EC tablet Take 1 tablet (81 mg total) by mouth daily.     atorvastatin (LIPITOR) 40 MG tablet TAKE ONE TABLET BY MOUTH DAILY AT 6PM (Patient taking differently: Take 40 mg by mouth daily.) 90  tablet 3   BLACK ELDERBERRY PO Take 1 tablet by mouth daily.     Continuous Blood Gluc Sensor (FREESTYLE LIBRE 3 SENSOR) MISC 1 applicator by Does not apply route every 14 (fourteen) days. 6 each 3   doxycycline (ADOXA) 100 MG tablet Take 100 mg by mouth 2 (two) times daily. Take 2 tablets in a single dose, as directed within 24 hours, but no later than 72 hours after intercourse.     emtricitabine-tenofovir (TRUVADA) 200-300 MG tablet Take 1 tablet by mouth daily.     Glucagon 3 MG/DOSE POWD Place 3 mg into the nose once as needed for up to 1 dose. 1 each 11   glucose blood (RELION TRUE METRIX TEST STRIPS) test strip Use as instructed 100 each 12   ibuprofen (ADVIL) 200 MG tablet Take 400 mg by mouth 3 (three) times daily as needed (pain).     Insulin Glargine (BASAGLAR KWIKPEN) 100 UNIT/ML Inject 20 Units into the skin daily. 30 mL 3   insulin lispro (HUMALOG KWIKPEN) 200 UNIT/ML KwikPen Inject 9-12 Units into the skin 3 (three) times daily before meals. 45 mL 0   Insulin Pen Needle (PEN NEEDLES) 32G X 4 MM MISC Use as instructed to administer insulin 4X daily 400 each 0   lisinopril (ZESTRIL) 20 MG tablet Take 20 mg by mouth daily.     metoprolol tartrate (LOPRESSOR) 25 MG tablet Take 1/2 tablet (12.5 mg total) by mouth 2 (two) times daily. 90 tablet 3   Multiple Vitamin (MULTIVITAMIN WITH MINERALS) TABS tablet Take 1 tablet by mouth daily.     Current Facility-Administered Medications on File Prior to Visit  Medication Dose Route Frequency Provider Last Rate Last Admin   testosterone cypionate (DEPOTESTOSTERONE CYPIONATE) injection 200 mg  200 mg Intramuscular Q14 Days Denita Lung, MD   200 mg at 01/17/20 0941     ALLERGIES: Allergies  Allergen Reactions   Gadolinium Derivatives Nausea And Vomiting    MRI dye    FAMILY HISTORY: Family History  Problem Relation Age of Onset   Thyroid disease Mother    Heart disease Father 57       MI, stents   Peripheral Artery Disease Father     Diabetes type II Father    Diabetes Father    Colon cancer Father    Diabetes Maternal Grandmother    Hypertension Maternal Grandfather    Hypertension Paternal Grandfather    Cancer Neg Hx    Stroke Neg Hx    Other Neg Hx        hypogonadism   Pancreatic cancer Neg Hx    Esophageal cancer Neg Hx     Objective:  Blood pressure 113/70, pulse (!) 124, height 5' 4"$  (1.626 m), weight 120 lb 6.4 oz (54.6 kg), SpO2 100 %. General: No acute distress.  Patient appears well-groomed.   Head:  Normocephalic/atraumatic Eyes:  fundi examined but not visualized Neck: supple, no paraspinal tenderness,  full range of motion Back: No paraspinal tenderness Heart: regular rate and rhythm Lungs: Clear to auscultation bilaterally. Vascular: No carotid bruits. Neurological Exam: Mental status: alert and oriented to person, place, and time, speech fluent and not dysarthric, language intact. Cranial nerves: CN I: not tested CN II: pupils equal, round and reactive to light, visual fields intact CN III, IV, VI:  full range of motion, no nystagmus, no ptosis CN V: facial sensation intact. CN VII: upper and lower face symmetric CN VIII: hearing intact CN IX, X: gag intact, uvula midline CN XI: sternocleidomastoid and trapezius muscles intact CN XII: tongue midline Bulk & Tone: normal, no fasciculations. Motor:  muscle strength 5/5 throughout.  No myotonia. Sensation:  Pinprick, temperature and vibratory sensation intact. Deep Tendon Reflexes:  2+ throughout,  toes downgoing.   Finger to nose testing:  Without dysmetria.   Heel to shin:  Without dysmetria.   Gait:  Normal station and stride.  Romberg negative.    Thank you for allowing me to take part in the care of this patient.  Metta Clines, DO

## 2022-04-27 ENCOUNTER — Encounter: Payer: Self-pay | Admitting: Neurology

## 2022-04-27 ENCOUNTER — Ambulatory Visit (INDEPENDENT_AMBULATORY_CARE_PROVIDER_SITE_OTHER): Payer: Commercial Managed Care - HMO | Admitting: Neurology

## 2022-04-27 VITALS — BP 113/70 | HR 124 | Ht 64.0 in | Wt 120.4 lb

## 2022-04-27 DIAGNOSIS — R29898 Other symptoms and signs involving the musculoskeletal system: Secondary | ICD-10-CM

## 2022-04-27 NOTE — Telephone Encounter (Signed)
Discussed pt with Dr Redmond School.  Pt has fired Dr. Redmond School from his care.

## 2022-04-27 NOTE — Telephone Encounter (Signed)
Juliann Pulse is handling transfer of records.

## 2022-04-27 NOTE — Patient Instructions (Signed)
MRI of brain with and without contrast Further recommendations pending results.

## 2022-04-28 ENCOUNTER — Encounter: Payer: Self-pay | Admitting: Internal Medicine

## 2022-04-28 ENCOUNTER — Ambulatory Visit: Payer: Commercial Managed Care - HMO | Attending: Internal Medicine | Admitting: Internal Medicine

## 2022-04-28 VITALS — BP 134/90 | HR 89 | Ht 64.0 in | Wt 122.8 lb

## 2022-04-28 DIAGNOSIS — I10 Essential (primary) hypertension: Secondary | ICD-10-CM | POA: Diagnosis not present

## 2022-04-28 DIAGNOSIS — Z794 Long term (current) use of insulin: Secondary | ICD-10-CM

## 2022-04-28 DIAGNOSIS — E785 Hyperlipidemia, unspecified: Secondary | ICD-10-CM | POA: Diagnosis not present

## 2022-04-28 DIAGNOSIS — M7989 Other specified soft tissue disorders: Secondary | ICD-10-CM

## 2022-04-28 DIAGNOSIS — I2510 Atherosclerotic heart disease of native coronary artery without angina pectoris: Secondary | ICD-10-CM | POA: Diagnosis not present

## 2022-04-28 DIAGNOSIS — E118 Type 2 diabetes mellitus with unspecified complications: Secondary | ICD-10-CM

## 2022-04-28 NOTE — Patient Instructions (Signed)
Medication Instructions:  No Changes In Medications at this time.  *If you need a refill on your cardiac medications before your next appointment, please call your pharmacy*  Lab Work: None Ordered At This Time.  If you have labs (blood work) drawn today and your tests are completely normal, you will receive your results only by: [place] (if you have MyChart) OR A paper copy in the mail If you have any lab test that is abnormal or we need to change your treatment, we will call you to review the results.  Testing/Procedures: Your physician has requested that you have a lower or upper extremity venous duplex. This test is an ultrasound of the veins in the legs or arms. It looks at venous blood flow that carries blood from the heart to the legs or arms. Allow one hour for a Lower Venous exam. Allow thirty minutes for an Upper Venous exam. There are no restrictions or special instructions.  Follow-Up: At [facility], you and your health needs are our priority.  As part of our continuing mission to provide you with exceptional heart care, we have created designated Provider Care Teams.  These Care Teams include your primary Cardiologist (physician) and Advanced Practice Providers (APPs -  Physician Assistants and Nurse Practitioners) who all work together to provide you with the care you need, when you need it.  Your next appointment:   1 year(s)  Provider:   Elouise Munroe, MD

## 2022-04-28 NOTE — Progress Notes (Signed)
Cardiology Office Note:    Date: 04/28/2022  ID:  [name] [name], DOB Sep 09, 1975, MRN [id number]  PCP:  Farrel Conners, MD  Cardiologist:  Elouise Munroe, MD  Electrophysiologist:  None   Referring MD: Denita Lung, MD   Chief Complaint/Reason for Referral: CAD, prior anterior infarct, f/u dizziness/lightheadedness  History of Present Illness:    [name] [name] is a 47 y.o. 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 He was found to have a subtotal LAD which had occluded at the time of his intervention and he had a stent placed. EF normal in 06/2019. He also has a history of depression, hyperlipidemia, hypertension, prior neurosyphilis, pancreatitis with alcohol use, tobacco abuse. He endorses daytime fatigue. Sleep study declined by his insurance. Fatigue is much better so may be able to defer indefinitely per patient request.   He presented for a cardiovascular check prior to moving out of state. He reported plans to move to [facility] where his family is located.  His father passed away one year prior. I expressed my condolences.    He was admitted to the hospital 07/06/2021 for acute pancreatitis. He had complained of chest and abdominal pain for 3 days prior to admission. He was found to have severely elevated blood pressure and mild elevation of troponin. CT of the abdomen and pelvis noted inflammatory changes consistent with mild focal pancreatitis. His BP was 212/133; received Nitropaste. Metoprolol was changed to carvedilol, lisinopril was increased to 40 mg daily, and he was started on Norvasc. For the first five days following his hospital admittance he noticed symptoms of imbalance and numbness in his right foot. This has not recurred and he felt it may have been related to his medication changes made in the hospital.  At his last appointment he was feeling better with no recurrent chest pain. Blood pressures were better  controlled. He had postponed his move to [place] due to his health issues.  He was seen by Diona Browner, NP 11/2021 where he was doing well from a cardiac standpoint. It was noted that he had recently restarted his atorvastatin. He continued to smoke cigars occasionally; cessation was encouraged.  On 04/15/22 he called the office reporting 2 weeks of LLE swelling with pain while walking. His right leg was beginning to swell. He saw his PCP later that day and LE doppler study was ordered to evaluate for DVT.  Today, he complains of BLE swelling worse in his left leg. In January he had presented to urgent care for "fallen arches". He was given steroids for treatment. Since then he has noticed swelling severe enough to prevent him from wearing his shoes at times. He is wearing compression socks. He states he has been sedentary for a while. He denies any other associated symptoms.  His blood pressure is slightly elevated in clinic today at 134/90. He would attribute this to known white coat hypertension and possibly too much salt with lunch.  We reviewed his medication regimen. He takes his medications consistently using a pillbox.   He denies any palpitations, chest pain, shortness of breath, lightheadedness, headaches, syncope, orthopnea, or PND.   Past Medical History:  Diagnosis Date   Anxiety    Coronary artery disease    a. 05/2014 OOH MI/PCI: LM nl, LAD 40p, 99p (3.0x28 Synergy DES) w/ R->L Collaterals, LCX nl, RCA 30p/d, EF 55% w/ sev distal inf/ant HK.   Depression    Diabetes mellitus without complication (  [place])    Erectile dysfunction    H/O echocardiogram    a. 05/2014 Echo: Ef 45-50%, sev mid antsept/ant HK.   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     Pancreatitis 10/2018   Tobacco abuse     Past Surgical History:  Procedure Laterality Date   CORONARY STENT PLACEMENT  05/24/2014    MID LAD DES    INTRAVASCULAR ULTRASOUND/IVUS N/A 05/17/2022   Procedure: Intravascular Ultrasound/IVUS;  Surgeon: Waynetta Sandy, MD;  Location: [facility];  Service: Cardiovascular;  Laterality: N/A;   LEFT HEART CATHETERIZATION WITH CORONARY ANGIOGRAM N/A 05/24/2014   Procedure: LEFT HEART CATHETERIZATION WITH CORONARY ANGIOGRAM;  Surgeon: Jettie Booze, MD;  Location: [facility];  Service: Cardiovascular;  Laterality: N/A;   LOWER EXTREMITY VENOGRAPHY Left 05/17/2022   Procedure: LOWER EXTREMITY VENOGRAPHY;  Surgeon: Waynetta Sandy, MD;  Location: [facility];  Service: Cardiovascular;  Laterality: Left;   PERCUTANEOUS CORONARY STENT INTERVENTION (PCI-S)  05/24/2014   Procedure: PERCUTANEOUS CORONARY STENT INTERVENTION (PCI-S);  Surgeon: Jettie Booze, MD;  Location: [facility] LAB;  Service: Cardiovascular;;   PERIPHERAL VASCULAR BALLOON ANGIOPLASTY  05/17/2022   Procedure: PERIPHERAL VASCULAR BALLOON ANGIOPLASTY;  Surgeon: Waynetta Sandy, MD;  Location: [facility];  Service: Cardiovascular;;   PERIPHERAL VASCULAR THROMBECTOMY  05/17/2022   Procedure: PERIPHERAL VASCULAR THROMBECTOMY;  Surgeon: Waynetta Sandy, MD;  Location: [facility];  Service: Cardiovascular;;   PILONIDAL CYST EXCISION  1995    Current Medications: Current Meds  Medication Sig   ALPRAZolam (XANAX) 0.5 MG tablet TAKE 1 TABLET(0.5 MG) BY MOUTH DAILY AS NEEDED FOR ANXIETY   aspirin EC 81 MG EC tablet Take 1 tablet (81 mg total) by mouth daily.   atorvastatin (LIPITOR) 40 MG tablet TAKE ONE TABLET BY MOUTH DAILY AT 6PM   BLACK ELDERBERRY PO Take 1,000 mg by mouth daily.   Continuous Blood Gluc Sensor (FREESTYLE LIBRE 3 SENSOR) MISC 1 applicator by Does not apply route every 14 (fourteen) days.   doxycycline (ADOXA) 100 MG tablet Take 200 mg by mouth daily as needed. Take 2 tablets in a single dose, as directed within 24 hours, but no later than  72 hours after intercourse.   emtricitabine-tenofovir (TRUVADA) 200-300 MG tablet Take 1 tablet by mouth daily.   Glucagon 3 MG/DOSE POWD Place 3 mg into the nose once as needed for up to 1 dose.   Insulin Glargine (BASAGLAR KWIKPEN) 100 UNIT/ML Inject 20 Units into the skin daily. (Patient taking differently: Inject 12-18 Units into the skin daily.)   insulin lispro (HUMALOG KWIKPEN) 200 UNIT/ML KwikPen Inject 9-12 Units into the skin 3 (three) times daily before meals. (Patient taking differently: Inject 4-7 Units into the skin 3 (three) times daily before meals.)   Insulin Pen Needle (PEN NEEDLES) 32G X 4 MM MISC Use as instructed to administer insulin 4X daily   lisinopril (ZESTRIL) 20 MG tablet Take 20 mg by mouth daily.   metoprolol tartrate (LOPRESSOR) 25 MG tablet Take 1/2 tablet (12.5 mg total) by mouth 2 (two) times daily.   Multiple Vitamin (MULTIVITAMIN WITH MINERALS) TABS tablet Take 1 tablet by mouth daily.   [DISCONTINUED] albuterol (VENTOLIN HFA) 108 (90 Base) MCG/ACT inhaler Inhale 2 puffs into the lungs every 6 (six) hours as needed for wheezing or shortness of breath (Cough). (Patient  not taking: Reported on 05/10/2022)   [DISCONTINUED] glucose blood (RELION TRUE METRIX TEST STRIPS) test strip Use as instructed   [DISCONTINUED] ibuprofen (ADVIL) 200 MG tablet Take 400 mg by mouth 3 (three) times daily as needed (pain). (Patient not taking: Reported on 05/10/2022)     Allergies:   Gadolinium derivatives   Social History   Tobacco Use   Smoking status: Former    Packs/day: 1.00    Years: 23.00    Additional pack years: 0.00    Total pack years: 23.00    Types: Cigarettes    Quit date: 2020    Years since quitting: 4.2   Smokeless tobacco: Never   Tobacco comments:    currently smoking 0.5 ppd, declines patch  Vaping Use   Vaping Use: Never used  Substance Use Topics   Alcohol use: Yes    Alcohol/week: 24.0 standard drinks of alcohol    Types: 24 Cans of beer per week     Comment: 1/5-3/4 Fifth, 4-5 times a week.  +Guilty, cutting down; 2/4 CAGE questions.   Drug use: No     Family History: The patient's family history includes Colon cancer in his father; Diabetes in his father and maternal grandmother; Diabetes type II in his father; Heart disease (age of onset: 51) in his father; Hypertension in his maternal grandfather and paternal grandfather; Peripheral Artery Disease in his father; Stroke in his father; Thyroid disease in his mother. There is no history of Cancer, Other, Pancreatic cancer, or Esophageal cancer.  ROS:   Please see the history of present illness.    (+) BLE swelling L>R. All other systems reviewed and are negative.  EKGs/Labs/Other Studies Reviewed:    The following studies were reviewed today:  CTA Chest  10/28/2021: IMPRESSION: 1. Negative for acute pulmonary embolus. 2. Mild heterogeneous bilateral ground-glass densities suggestive of respiratory infection/pneumonia.   Aortic Atherosclerosis (ICD10-I70.0).  Echo 07/07/2021:  1. Left ventricular ejection fraction, by estimation, is 60 to 65%. The  left ventricle has normal function. The left ventricle has no regional  wall motion abnormalities. Left ventricular diastolic parameters are  indeterminate.   2. Right ventricular systolic function is normal. The right ventricular  size is normal. Tricuspid regurgitation signal is inadequate for assessing  PA pressure.   3. The mitral valve is normal in structure. Trivial mitral valve  regurgitation. No evidence of mitral stenosis.   4. The aortic valve was not well visualized. Aortic valve regurgitation  is trivial. No aortic stenosis is present.   5. The inferior vena cava is normal in size with greater than 50%  respiratory variability, suggesting right atrial pressure of 3 mmHg.  CT Abdomen/Pelvis 07/07/2021: IMPRESSION: Cavernous transformation of the portal vein.   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  Some inflammatory changes noted in the region of the head of the pancreas consistent with mild focal pancreatitis.   Mild biliary ductal dilatation without definitive obstructive lesion.   No other focal abnormality is noted.   EKG:  EKG is personally reviewed. 04/28/2022:  Sinus rhythm, sinus arrhythmia. Inferior infarct. 07/20/2021: Sinus rhythm. Inferior infarct. 06/03/2021: NSR, inferior infarct pattern. Poor R wave progression.   Imaging studies that I have independently reviewed today: n/a  Recent Labs: 10/28/2021: B Natriuretic Peptide 51.0 05/13/2022: ALT 25 05/17/2022: BUN 5; Creatinine, Ser 0.70; Potassium 4.0; Sodium 139 05/18/2022: Hemoglobin 12.7; Platelets 172   Recent Lipid Panel    Component Value Date/Time   CHOL 42 05/13/2022 1125   CHOL  178 05/30/2019 0935   TRIG 55.0 05/13/2022 1125   HDL 26.00 (L) 05/13/2022 1125   HDL 73 05/30/2019 0935   CHOLHDL 2 05/13/2022 1125   VLDL 11.0 05/13/2022 1125   LDLCALC 5 05/13/2022 1125   LDLCALC 92 05/30/2019 0935    Physical Exam:    VS:  BP (!) 134/90   Pulse 89   Ht 5\' 4"  (1.626 m)   Wt 122 lb 12.8 oz (55.7 kg)   SpO2 96%   BMI 21.08 kg/m     Wt Readings from Last 5 Encounters:  05/17/22 120 lb (54.4 kg)  05/13/22 122 lb 9.6 oz (55.6 kg)  05/13/22 122 lb (55.3 kg)  05/12/22 124 lb (56.2 kg)  04/28/22 122 lb 12.8 oz (55.7 kg)    Constitutional: No acute distress Eyes: sclera non-icteric, normal conjunctiva and lids ENMT: normal dentition, moist mucous membranes Cardiovascular: regular rhythm, normal rate, no murmur. S1 and S2 normal. No jugular venous distention.  Respiratory: clear to auscultation bilaterally GI : normal bowel sounds, soft and nontender. No distention.   MSK: extremities warm, well perfused. No edema.  NEURO: grossly nonfocal exam, moves all extremities. PSYCH: alert and oriented x 3, normal mood and affect.   ASSESSMENT:    1. Swelling of lower extremity   2. Coronary artery disease  involving native coronary artery of native heart without angina pectoris   3. Essential hypertension   4. Hyperlipidemia LDL goal <70   5. Type 2 diabetes mellitus with complication, with long-term current use of insulin (HCC)      PLAN:    Bilateral LE swelling - concern for venous thrombosis, will order LE dopplers.  Coronary artery disease involving native coronary artery of native heart without angina pectoris. - continue ASA 81 mg daily. Continue metoprolol 50 mg BID, atorvastatin 40 mg daily.   Essential hypertension Dizziness HTN urgency - bp controlled today on new regimen of amlodipine 5 mg daily, lisinopril 40 mg daily, carvedilol 25 mg BID. Continue.    Hyperlipidemia, unspecified hyperlipidemia type -continue statin, lipids with reasonable control. Atorvastatin 40 mg daily.    EXCESSIVE DAYTIME SLEEPINESS - EPWORTH SCORE 13 - sleep study denied by insurance, however he feels much better and would like to defer sleep study at this time.     Total time of encounter: 25 minutes total time of encounter, including 15 minutes spent in face-to-face patient care on the date of this encounter. This time includes coordination of care and counseling regarding above mentioned problem list. Remainder of non-face-to-face time involved reviewing chart documents/testing relevant to the patient encounter and documentation in the medical record. I have independently reviewed documentation from referring provider.   Cherlynn Kaiser, MD, [place]   Shared Decision Making/Informed Consent:       Medication Adjustments/Labs and Tests Ordered: Current medicines are reviewed at length with the patient today.  Concerns regarding medicines are outlined above.   Orders Placed This Encounter  Procedures   EKG 12-Lead   VAS [place] LOWER EXTREMITY VENOUS (DVT)   No orders of the defined types were placed in this encounter.  Patient Instructions  Medication  Instructions:  No Changes In Medications at this time.  *If you need a refill on your cardiac medications before your next appointment, please call your pharmacy*  Lab Work: None Ordered At This Time.  If you have labs (blood work) drawn today and your tests are completely normal, you will receive your results only  by: MyChart Message (if you have MyChart) OR A paper copy in the mail If you have any lab test that is abnormal or we need to change your treatment, we will call you to review the results.  Testing/Procedures: Your physician has requested that you have a lower or upper extremity venous duplex. This test is an ultrasound of the veins in the legs or arms. It looks at venous blood flow that carries blood from the heart to the legs or arms. Allow one hour for a Lower Venous exam. Allow thirty minutes for an Upper Venous exam. There are no restrictions or special instructions.  Follow-Up: At [facility], you and your health needs are our priority.  As part of our continuing mission to provide you with exceptional heart care, we have created designated Provider Care Teams.  These Care Teams include your primary Cardiologist (physician) and Advanced Practice Providers (APPs -  Physician Assistants and Nurse Practitioners) who all work together to provide you with the care you need, when you need it.  Your next appointment:   1 year(s)  Provider:   Elouise Munroe, MD       [facility],acting as a scribe for Elouise Munroe, MD.,have documented all relevant documentation on the behalf of Elouise Munroe, MD,as directed by  Elouise Munroe, MD while in the presence of Elouise Munroe, MD.  I, Elouise Munroe, MD, have reviewed all documentation for the visit on 07/20/2021. The documentation on today's date of service for the exam, diagnosis, procedures, and orders are all accurate and complete.

## 2022-04-29 ENCOUNTER — Telehealth: Payer: Self-pay | Admitting: Family Medicine

## 2022-04-29 ENCOUNTER — Encounter: Payer: Self-pay | Admitting: Internal Medicine

## 2022-04-29 NOTE — Telephone Encounter (Signed)
Pt called wanting copy of records that Juliann Pulse had ordered from 2 of his previous facilities.  Juliann Pulse did not order, when she got to the records request then the patient had terminated Dr Redmond School from his care.  So Juliann Pulse shreaded request.    This was explained to patient, I did tell him, he could get copies sent to him directly.  He did want date of his last visit sent to him in My Chart.  Will do.

## 2022-05-05 ENCOUNTER — Ambulatory Visit (HOSPITAL_COMMUNITY)
Admission: RE | Admit: 2022-05-05 | Payer: Commercial Managed Care - HMO | Source: Ambulatory Visit | Attending: Internal Medicine | Admitting: Internal Medicine

## 2022-05-05 ENCOUNTER — Encounter (HOSPITAL_COMMUNITY): Payer: Self-pay

## 2022-05-08 ENCOUNTER — Other Ambulatory Visit: Payer: Commercial Managed Care - HMO

## 2022-05-10 ENCOUNTER — Encounter (HOSPITAL_COMMUNITY): Payer: Self-pay

## 2022-05-10 ENCOUNTER — Ambulatory Visit (HOSPITAL_COMMUNITY)
Admission: RE | Admit: 2022-05-10 | Discharge: 2022-05-10 | Disposition: A | Discharge status: Home / Self Care | Payer: Commercial Managed Care - HMO | Source: Ambulatory Visit | Attending: Cardiovascular Disease | Admitting: Cardiovascular Disease

## 2022-05-10 ENCOUNTER — Other Ambulatory Visit (HOSPITAL_COMMUNITY): Payer: Self-pay

## 2022-05-10 ENCOUNTER — Ambulatory Visit (HOSPITAL_BASED_OUTPATIENT_CLINIC_OR_DEPARTMENT_OTHER)
Admission: RE | Admit: 2022-05-10 | Discharge: 2022-05-10 | Disposition: A | Discharge status: Home / Self Care | Payer: Commercial Managed Care - HMO | Source: Ambulatory Visit | Attending: Cardiovascular Disease | Admitting: Cardiovascular Disease

## 2022-05-10 VITALS — BP 101/75 | HR 88

## 2022-05-10 DIAGNOSIS — I82422 Acute embolism and thrombosis of left iliac vein: Secondary | ICD-10-CM

## 2022-05-10 DIAGNOSIS — M7989 Other specified soft tissue disorders: Secondary | ICD-10-CM | POA: Insufficient documentation

## 2022-05-10 MED ORDER — APIXABAN (ELIQUIS) VTE STARTER PACK (10MG AND 5MG)
ORAL_TABLET | ORAL | 0 refills | Status: AC
  Filled 2022-05-10: qty 74, 30d supply, fill #0

## 2022-05-10 NOTE — Patient Instructions (Signed)
-  Start apixaban (Eliquis) 10 mg twice daily for 7 days followed by 5 mg twice daily. -Your refills have been sent to your [facility]. You will likely need to call the pharmacy to ask them to fill this when you start to run low on your current supply.  -It is important to take your medication around the same time every day.  -Avoid NSAIDs like ibuprofen (Advil, Motrin) and naproxen (Aleve) as well as aspirin doses over 100 mg daily. -Tylenol (acetaminophen) is the preferred over the counter pain medication to lower the risk of bleeding. -Be sure to alert all of your health care providers that you are taking an anticoagulant prior to starting a new medication or having a procedure. -Monitor for signs and symptoms of bleeding (abnormal bruising, prolonged bleeding, nose bleeds, bleeding from gums, discolored urine, black tarry stools). If you have fallen and hit your head OR if your bleeding is severe or not stopping, seek emergency care.  -Go to the emergency room if emergent signs and symptoms of new clot occur (new or worse swelling and pain in an arm or leg, shortness of breath, chest pain, fast or irregular heartbeats, lightheadedness, dizziness, fainting, coughing up blood) or if you experience a significant color change (pale or blue) in the extremity that has the DVT.  -We recommend you wear thigh or knee high compression stockings (15-20 mmHg) as long as you are having swelling or pain. Be sure to purchase the correct size and take them off at night.   Your next visit is with Dr. Donzetta Matters at [facility].  [address] [place], [place]  [address] [phone]  If you have any questions or need to reschedule an appointment, please call [facility].  If you are having an emergency, call 911 or present to the nearest emergency room.   What is a DVT?  -Deep vein thrombosis (DVT) is a condition in which a blood clot forms in a vein of the deep venous system which can occur  in the lower leg, thigh, pelvis, arm, or neck. This condition is serious and can be life-threatening if the clot travels to the arteries of the lungs and causing a blockage (pulmonary embolism, PE). A DVT can also damage veins in the leg, which can lead to long-term venous disease, leg pain, swelling, discoloration, and ulcers or sores (post-thrombotic syndrome).  -Treatment may include taking an anticoagulant medication to prevent more clots from forming and the current clot from growing, wearing compression stockings, and/or surgical procedures to remove or dissolve the clot.

## 2022-05-10 NOTE — Progress Notes (Addendum)
DVT Clinic Note  Name: [name] [name]     MRN: [id number]     DOB: 1975/12/16     Sex: male  PCP: Patient, No Pcp Per  Today's Visit: Visit Information: Initial Visit  Referred to DVT Clinic by: Dr. Margaretann Loveless (cardiology)  Referred to CPP by: Dr. Donzetta Matters Reason for referral:  Chief Complaint  Patient presents with   DVT   HISTORY OF PRESENT ILLNESS:  [name] [name] is a 47 y.o. male with PMH HTN, HLD, T2DM, CAD, anxiety, depression, alcohol use disorder, history of neurosyphilis (2016), and on Truvada for PrEP, who presents after diagnosis of DVT for medication management. Per chart review, patient called [facility] 04/15/22 complaining of pain and swelling of LLE for 2 weeks. Patient reports he tried to use natural remedies to improve swelling like Epsom salt baths, and initially the swelling improved but has recently worsened again. [place] today showed age-indeterminate DVT extending as proximal as the external iliac vein. Patient reports that his father, now deceased, had a history of multiple blood clots. Last long car ride was mid December to [place]. Patient quit smoking cigarettes 3 years ago but does still smoke cigars. Reports drinking alcohol 5 days/week, 3-4 drinks per day.   Positive Thrombotic Risk Factors: None Present, Smoking Bleeding Risk Factors: Alcohol abuse, Antiplatelet therapy  Negative Thrombotic Risk Factors: Previous VTE, Recent surgery (within 3 months), Recent trauma (within 3 months), Recent admission to hospital with acute illness (within 3 months), Paralysis, paresis, or recent plaster cast immobilization of lower extremity, Central venous catheterization, Pregnancy, Testosterone therapy, Older age, Estrogen therapy, Bed rest >72 hours within 3 months, Sedentary journey lasting >8 hours within 4 weeks, Within 6 weeks postpartum, Recent cesarean section (within 3 months), Erythropoiesis-stimulating agent, Recent COVID diagnosis (within 3 months), Non-malignant, chronic  inflammatory condition, Known thrombophilic condition, Active cancer, Obesity  Rx Insurance Coverage: Commercial Rx Affordability: Filled today at [facility] for $0 using one time savings card. Unable to tell what next month's copay is as a prior authorization is needed. Provided patient with copay card to bring cost down to $10/month in the event his copay is higher than that.  Preferred Pharmacy: Will send refills to [facility] on [address]  Past Medical History:  Diagnosis Date   Anxiety    Coronary artery disease    a. 05/2014 OOH MI/PCI: LM nl, LAD 40p, 99p (3.0x28 Synergy DES) w/ R->L Collaterals, LCX nl, RCA 30p/d, EF 55% w/ sev distal inf/ant HK.   Depression    Diabetes mellitus without complication ([place])    Erectile dysfunction    H/O echocardiogram    a. 05/2014 Echo: Ef 45-50%, sev mid antsept/ant HK.   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     Pancreatitis 10/2018   Tobacco abuse     Past Surgical History:  Procedure Laterality Date   CORONARY STENT PLACEMENT  05/24/2014   MID LAD DES    LEFT HEART CATHETERIZATION WITH CORONARY ANGIOGRAM N/A 05/24/2014   Procedure: LEFT HEART CATHETERIZATION WITH CORONARY ANGIOGRAM;  Surgeon: Jettie Booze, MD;  Location: [facility] CATH LAB;  Service: Cardiovascular;  Laterality: N/A;   PERCUTANEOUS CORONARY STENT INTERVENTION (PCI-S)  05/24/2014   Procedure: PERCUTANEOUS CORONARY STENT INTERVENTION (PCI-S);  Surgeon: Jettie Booze, MD;  Location: [facility] CATH LAB;  Service: Cardiovascular;;   [address]  Social History   Socioeconomic History   Marital status: Single    Spouse name: Not on file   Number of children: Not on file   Years of education: Not on file   Highest education level: Not on file  Occupational History   Occupation: Secondary school teacher  Tobacco Use    Smoking status: Every Day    Packs/day: 1.00    Years: 23.00    Total pack years: 23.00    Types: Cigarettes   Smokeless tobacco: Never   Tobacco comments:    currently smoking 0.5 ppd, declines patch  Vaping Use   Vaping Use: Never used  Substance and Sexual Activity   Alcohol use: Yes    Alcohol/week: 24.0 standard drinks of alcohol    Types: 24 Cans of beer per week    Comment: 1/5-3/4 [place], 4-5 times a week.  +Guilty, cutting down; 2/4 CAGE questions.   Drug use: No   Sexual activity: Yes    Partners: Male  Other Topics Concern   Not on file  Social History Narrative   Lives at home with roommate, exercise at gym in apartment complex, diet - no discretion.       Social Determinants of Radio broadcast assistant Strain: Not on file  Food Insecurity: Not on file  Transportation Needs: Not on file  Physical Activity: Not on file  Stress: Not on file  Social Connections: Not on file  Intimate Partner Violence: Not on file    Family History  Problem Relation Age of Onset   Thyroid disease Mother    Stroke Father    Heart disease Father 63       MI, stents   Peripheral Artery Disease Father    Diabetes type II Father    Diabetes Father    Colon cancer Father    Diabetes Maternal Grandmother    Hypertension Maternal Grandfather    Hypertension Paternal Grandfather    Cancer Neg Hx    Other Neg Hx        hypogonadism   Pancreatic cancer Neg Hx    Esophageal cancer Neg Hx     Allergies as of 05/10/2022 - Review Complete 05/10/2022  Allergen Reaction Noted   Gadolinium derivatives Nausea And Vomiting 05/23/2014    Current Outpatient Medications on File Prior to Encounter  Medication Sig Dispense Refill   acetaminophen (TYLENOL) 500 MG tablet Take 1,000 mg by mouth every 6 (six) hours as needed for mild pain or moderate pain.     ALPRAZolam (XANAX) 0.5 MG tablet TAKE 1 TABLET(0.5 MG) BY MOUTH DAILY AS NEEDED FOR ANXIETY 30 tablet 0   aspirin EC 81 MG EC tablet  Take 1 tablet (81 mg total) by mouth daily.     atorvastatin (LIPITOR) 40 MG tablet TAKE ONE TABLET BY MOUTH DAILY AT 6PM (Patient taking differently: Take 40 mg by mouth daily.) 90 tablet 3   BLACK ELDERBERRY PO Take 1 tablet by mouth daily.     doxycycline (ADOXA) 100 MG tablet Take 100 mg by mouth daily as needed. Take 2 tablets in a single dose, as directed within 24 hours, but no later than 72 hours after intercourse.     emtricitabine-tenofovir (TRUVADA) 200-300 MG tablet Take 1 tablet by mouth daily.     Insulin Glargine (BASAGLAR KWIKPEN) 100 UNIT/ML Inject 20 Units into the skin daily. 30 mL 3   insulin lispro (HUMALOG KWIKPEN) 200 UNIT/ML KwikPen Inject 9-12 Units into the skin 3 (  three) times daily before meals. 45 mL 0   lisinopril (ZESTRIL) 20 MG tablet Take 20 mg by mouth daily.     metoprolol tartrate (LOPRESSOR) 25 MG tablet Take 1/2 tablet (12.5 mg total) by mouth 2 (two) times daily. 90 tablet 3   Multiple Vitamin (MULTIVITAMIN WITH MINERALS) TABS tablet Take 1 tablet by mouth daily.     Continuous Blood Gluc Sensor (FREESTYLE LIBRE 3 SENSOR) MISC 1 applicator by Does not apply route every 14 (fourteen) days. 6 each 3   Glucagon 3 MG/DOSE POWD Place 3 mg into the nose once as needed for up to 1 dose. 1 each 11   glucose blood (RELION TRUE METRIX TEST STRIPS) test strip Use as instructed 100 each 12   Insulin Pen Needle (PEN NEEDLES) 32G X 4 MM MISC Use as instructed to administer insulin 4X daily 400 each 0   No current facility-administered medications on file prior to encounter.   REVIEW OF SYSTEMS:  Review of Systems  Respiratory:  Negative for shortness of breath.   Cardiovascular:  Positive for leg swelling. Negative for chest pain and palpitations.  Musculoskeletal:  Positive for myalgias.  Neurological:  Positive for tingling. Negative for dizziness and headaches.   PHYSICAL EXAMINATION:  Vitals:   05/10/22 1624  BP: 101/75  Pulse: 88  SpO2: 100%    Physical  Exam Vitals reviewed.  Cardiovascular:     Rate and Rhythm: Normal rate.  Pulmonary:     Effort: Pulmonary effort is normal.  Musculoskeletal:     Right lower leg: Edema (3+) present.     Left lower leg: Edema (2+) present.  Skin:    Findings: Erythema present.   Villalta Score for Post-Thrombotic Syndrome: Pain: Moderate Cramps: Moderate Heaviness: Absent Paresthesia: Moderate Pruritus: Mild Pretibial Edema: Severe Skin Induration: Absent Hyperpigmentation: Absent Redness: Mild Venous Ectasia: Mild Pain on calf compression: Mild Villalta Preliminary Score: 13 Is venous ulcer present?: No If venous ulcer is present and score is <15, then 15 points total are assigned: Absent Villalta Total Score: 13  LABS:  CBC     Component Value Date/Time   WBC 10.1 11/11/2021 1535   WBC 12.2 (H) 10/28/2021 1845   RBC 4.04 (L) 11/11/2021 1535   RBC 4.69 10/28/2021 1845   HGB 13.3 11/11/2021 1535   HCT 39.1 11/11/2021 1535   PLT 258 11/11/2021 1535   MCV 97 11/11/2021 1535   MCH 32.9 11/11/2021 1535   MCH 32.8 10/28/2021 1845   MCHC 34.0 11/11/2021 1535   MCHC 33.8 10/28/2021 1845   RDW 13.7 11/11/2021 1535   LYMPHSABS 2.7 11/11/2021 1535   MONOABS 1.6 (H) 10/28/2021 1845   EOSABS 0.1 11/11/2021 1535   BASOSABS 0.1 11/11/2021 1535    Hepatic Function      Component Value Date/Time   PROT 6.4 11/11/2021 1535   ALBUMIN 3.5 (L) 11/11/2021 1535   AST 40 11/11/2021 1535   ALT 29 11/11/2021 1535   ALKPHOS 125 (H) 11/11/2021 1535   BILITOT 0.3 11/11/2021 1535   BILIDIR 0.1 07/06/2021 0100   IBILI 0.5 07/06/2021 0100    Renal Function   Lab Results  Component Value Date   CREATININE 0.84 11/11/2021   CREATININE 1.09 10/29/2021   CREATININE 1.62 (H) 10/29/2021    CrCl cannot be calculated (Patient's most recent lab result is older than the maximum 21 days allowed.).   [facility] Vascular Lab Studies:  05/10/22 VAS [place] LOWER EXTREMITY VENOUS LEFT (DVT)   Left Technical  Findings:   Superficial edema noted in the calf. Popiteal fossa collapsed Baker's cyst  2.0 x .8 x 2.1 cm. Patency seen in the IVC at liver and mid abdomen and  left CIV proximally. Limited visualization of vein from mid CIV to mid EIV  due to overlying bowel gas. Left distal EIV shows non-occlusive DVT.   Summary:  RIGHT:  - No evidence of common femoral vein obstruction.   LEFT:  - Findings consistent with age indeterminate deep vein thrombosis  involving the left common femoral vein, SF junction, left femoral vein,  left popliteal vein, left proximal profunda vein, left posterior tibial  veins, and left peroneal veins.   ASSESSMENT: Location of DVT: Left iliac vein, Left common femoral vein, Left femoral vein, Left popliteal vein, Left distal vein Cause of DVT: unprovoked - last long car ride was >1 month prior to symptom onset. Unclear how large a role this would have played given the timeline. Has quit smoking, does occasionally smoke cigars. Family history of VTE. No other strong provoking risk factors present. Will refer to hematology for further work up.   Discussed patient with Dr. Donzetta Matters given involvement of external iliac and common femoral veins. Symptom onset ~6 weeks ago but still with significant pain and swelling today. Recommended outpatient follow up with him this week, which is scheduled for Wednesday.   Eliquis requires a prior authorization. Filled today using one time $0 savings card at [facility]. I will send in Eliquis refills once the PA is approved.   PLAN: -Start apixaban (Eliquis) 10 mg twice daily for 7 days followed by 5 mg twice daily. -Expected duration of therapy: Likely long-term given unprovoked DVT, but will defer duration to hematology after work up. Therapy started on 05/10/22. -Patient educated on purpose, proper use and potential adverse effects of apixaban (Eliquis). -Discussed importance of taking medication around the same time  every day. -Advised patient of medications to avoid (NSAIDs, aspirin doses >100 mg daily). -Educated that Tylenol (acetaminophen) is the preferred analgesic to lower the risk of bleeding. -Advised patient to alert all providers of anticoagulation therapy prior to starting a new medication or having a procedure. -Emphasized importance of monitoring for signs and symptoms of bleeding (abnormal bruising, prolonged bleeding, nose bleeds, bleeding from gums, discolored urine, black tarry stools). -Educated patient to present to the ED if emergent signs and symptoms of new thrombosis occur. -Counseled patient to wear compression stockings daily, removing at night. Recommended elevation as well. -All of the patient's questions today were answered.  -I will follow up on the prior authorization for Eliquis and send in refills once approved.   Follow up: with Dr. Donzetta Matters on Wednesday. Referral to hematology placed.   Rebbeca Paul, PharmD, [place], CPP [facility] Clinical Pharmacist Practitioner Office: [phone]  I have evaluated the patient's chart/imaging and refer this patient to the Clinical Pharmacist Practitioner for medication management. I have reviewed the CPP's documentation and agree with her assessment and plan. I was immediately available during the visit for questions and collaboration.   Patient will be seen in my office this week to evaluate for symptom improvement and possible need for intervention.  Servando Snare, MD

## 2022-05-10 NOTE — Addendum Note (Signed)
Encounter addended by: Waynetta Sandy, MD on: 05/10/2022 7:50 PM  Actions taken: Clinical Note Signed

## 2022-05-11 ENCOUNTER — Telehealth: Payer: Self-pay | Admitting: Oncology

## 2022-05-11 ENCOUNTER — Other Ambulatory Visit (HOSPITAL_COMMUNITY): Payer: Self-pay

## 2022-05-11 ENCOUNTER — Telehealth (HOSPITAL_COMMUNITY): Payer: Self-pay

## 2022-05-11 MED ORDER — APIXABAN 5 MG PO TABS: 5.0000 mg | ORAL_TABLET | Freq: Two times a day (BID) | ORAL | 5 refills | Status: DC

## 2022-05-11 NOTE — Telephone Encounter (Signed)
Scheduled appt per 2/5 referral. Pt is aware of appt date and time. Pt is aware to arrive 15 mins prior to appt time and to bring and updated insurance card. Pt is aware of appt location.

## 2022-05-11 NOTE — Telephone Encounter (Signed)
Sent in refills to patient's preferred pharmacy for after starter pack runs out. Provided patient with copay card to reduce cost to $10/month at yesterday's visit.

## 2022-05-11 NOTE — Telephone Encounter (Signed)
Patient Advocate Encounter  Prior authorization is required for Eliquis '5MG'$ . PA submitted and APPROVED on 05/11/2022. Test billing returns $25 copay for 30 day supply.  Key [id number] Effective: 05/11/22 - 05/11/22  Clista Bernhardt, CPhT Rx Patient Advocate Phone: [phone]

## 2022-05-11 NOTE — Addendum Note (Signed)
Addended by: Christy Gentles on: 05/11/2022 08:46 AM   Modules accepted: Orders

## 2022-05-12 ENCOUNTER — Ambulatory Visit (INDEPENDENT_AMBULATORY_CARE_PROVIDER_SITE_OTHER): Payer: Commercial Managed Care - HMO | Admitting: Vascular Surgery

## 2022-05-12 ENCOUNTER — Encounter: Payer: Self-pay | Admitting: Vascular Surgery

## 2022-05-12 VITALS — BP 118/80 | HR 89 | Temp 98.0°F | Resp 20 | Ht 64.0 in | Wt 124.0 lb

## 2022-05-12 DIAGNOSIS — I82422 Acute embolism and thrombosis of left iliac vein: Secondary | ICD-10-CM | POA: Diagnosis not present

## 2022-05-12 NOTE — H&P (View-Only) (Signed)
 Patient ID: [name] [name], male   DOB: 01/28/1976, 47 y.o.   MRN: [id number]  Reason for Consult: New Patient (Initial Visit)   Referred by No ref. provider found  Subjective:     HPI:  [name] Mcquerry "Peaches" is a 47 y.o. male with history of diabetes, hypertension, hyperlipidemia and history of sepsis requiring long-term antibiotics.  [name] [name] also has known coronary artery disease when [name] [name] was very young required intervention.  Patient most recently was having issues with his arches on both feet and was given steroids about 2 months ago and then developed severe swelling of his left greater than right lower extremity.  [name] [name] does have swelling in the right ankle which she states is mild but the swelling of the left leg has been persistent and causes him to have difficulty walking given the recent increase in size.  [name] [name] has not worn compression stockings.  [name] [name] has no personal or family history of DVT.  [name] [name] has been started on Eliquis and has had trace amounts of blood in his stool.  [name] [name] denies any history of cancer.  [name] [name] has had recent weight loss but has been mostly sedentary and has not had much of an appetite.  Denies any recent fevers or chills.  Past Medical History:  Diagnosis Date   Anxiety    Coronary artery disease    a. 05/2014 OOH MI/PCI: LM nl, LAD 40p, 99p (3.0x28 Synergy DES) w/ R->L Collaterals, LCX nl, RCA 30p/d, EF 55% w/ sev distal inf/ant HK.   Depression    Diabetes mellitus without complication (HCC)    Erectile dysfunction    H/O echocardiogram    a. 05/2014 Echo: Ef 45-50%, sev mid antsept/ant HK.   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     Pancreatitis 10/2018   Tobacco abuse    Family History  Problem Relation Age of Onset   Thyroid disease Mother    Stroke Father    Heart disease Father 50       MI, stents   Peripheral Artery Disease Father    Diabetes type II Father     Diabetes Father    Colon cancer Father    Diabetes Maternal Grandmother    Hypertension Maternal Grandfather    Hypertension Paternal Grandfather    Cancer Neg Hx    Other Neg Hx        hypogonadism   Pancreatic cancer Neg Hx    Esophageal cancer Neg Hx    Past Surgical History:  Procedure Laterality Date   CORONARY STENT PLACEMENT  05/24/2014   MID LAD DES    LEFT HEART CATHETERIZATION WITH CORONARY ANGIOGRAM N/A 05/24/2014   Procedure: LEFT HEART CATHETERIZATION WITH CORONARY ANGIOGRAM;  Surgeon: Jayadeep S Varanasi, MD;  Location: [facility] CATH [facility];  Service: Cardiovascular;  Laterality: N/A;   PERCUTANEOUS CORONARY STENT INTERVENTION (PCI-S)  05/24/2014   Procedure: PERCUTANEOUS CORONARY STENT INTERVENTION (PCI-S);  Surgeon: Jayadeep S Varanasi, MD;  Location: [facility] CATH [facility];  Service: Cardiovascular;;   PILONIDAL CYST EXCISION  1995    Short Social History:  Social History   Tobacco Use   Smoking status: Former    Packs/day: 1.00    Years: 23.00    Total pack years: 23.00    Types: Cigarettes    Quit date: 2020    Years since quitting: 4.1   Smokeless tobacco: Never     Tobacco comments:    currently smoking 0.5 ppd, declines patch  Substance Use Topics   Alcohol use: Yes    Alcohol/week: 24.0 standard drinks of alcohol    Types: 24 Cans of beer per week    Comment: 1/5-3/4 Fifth, 4-5 times a week.  +Guilty, cutting down; 2/4 CAGE questions.    Allergies  Allergen Reactions   Gadolinium Derivatives Nausea And Vomiting    MRI dye    Current Outpatient Medications  Medication Sig Dispense Refill   acetaminophen (TYLENOL) 500 MG tablet Take 1,000 mg by mouth every 6 (six) hours as needed for mild pain or moderate pain.     ALPRAZolam (XANAX) 0.5 MG tablet TAKE 1 TABLET(0.5 MG) BY MOUTH DAILY AS NEEDED FOR ANXIETY 30 tablet 0   apixaban (ELIQUIS) 5 MG TABS tablet Take 1 tablet (5 mg total) by mouth 2 (two) times daily. Start taking after completion of starter pack. 60 tablet  5   APIXABAN (ELIQUIS) VTE STARTER PACK (10MG AND 5MG) Take as directed on package: start with two-5mg tablets twice daily for 7 days. On day 8, switch to one-5mg tablet twice daily. 74 each 0   aspirin EC 81 MG EC tablet Take 1 tablet (81 mg total) by mouth daily.     atorvastatin (LIPITOR) 40 MG tablet TAKE ONE TABLET BY MOUTH DAILY AT 6PM (Patient taking differently: Take 40 mg by mouth daily.) 90 tablet 3   BLACK ELDERBERRY PO Take 1 tablet by mouth daily.     Continuous Blood Gluc Sensor (FREESTYLE LIBRE 3 SENSOR) MISC 1 applicator by Does not apply route every 14 (fourteen) days. 6 each 3   doxycycline (ADOXA) 100 MG tablet Take 100 mg by mouth daily as needed. Take 2 tablets in a single dose, as directed within 24 hours, but no later than 72 hours after intercourse.     emtricitabine-tenofovir (TRUVADA) 200-300 MG tablet Take 1 tablet by mouth daily.     Glucagon 3 MG/DOSE POWD Place 3 mg into the nose once as needed for up to 1 dose. 1 each 11   glucose blood (RELION TRUE METRIX TEST STRIPS) test strip Use as instructed 100 each 12   Insulin Glargine (BASAGLAR KWIKPEN) 100 UNIT/ML Inject 20 Units into the skin daily. 30 mL 3   insulin lispro (HUMALOG KWIKPEN) 200 UNIT/ML KwikPen Inject 9-12 Units into the skin 3 (three) times daily before meals. 45 mL 0   Insulin Pen Needle (PEN NEEDLES) 32G X 4 MM MISC Use as instructed to administer insulin 4X daily 400 each 0   lisinopril (ZESTRIL) 20 MG tablet Take 20 mg by mouth daily.     metoprolol tartrate (LOPRESSOR) 25 MG tablet Take 1/2 tablet (12.5 mg total) by mouth 2 (two) times daily. 90 tablet 3   Multiple Vitamin (MULTIVITAMIN WITH MINERALS) TABS tablet Take 1 tablet by mouth daily.     No current facility-administered medications for this visit.    Review of Systems  Constitutional:  Constitutional negative. HENT: HENT negative.  Eyes: Eyes negative.  Cardiovascular: Positive for leg swelling.  GI: Positive for blood in stool.   Musculoskeletal: Positive for leg pain.  Skin: Skin negative.  Neurological: Neurological negative. Hematologic: Hematologic/lymphatic negative.  Psychiatric: Psychiatric negative.        Objective:  Objective   Vitals:   05/12/22 0832  BP: 118/80  Pulse: 89  Resp: 20  Temp: 98 F (36.7 C)  SpO2: 98%  Weight: 124 lb (56.2 kg)    Height: 5' 4" (1.626 m)   Body mass index is 21.28 kg/m.  Physical Exam HENT:     Head: Normocephalic.     Nose: Nose normal.  Eyes:     Pupils: Pupils are equal, round, and reactive to light.  Cardiovascular:     Rate and Rhythm: Normal rate.  Pulmonary:     Effort: Pulmonary effort is normal.  Abdominal:     General: Abdomen is flat.  Musculoskeletal:     Right lower leg: Edema present.     Left lower leg: Edema present.     Comments: Right leg 33cm, Left leg 40cm  Skin:    Capillary Refill: Capillary refill takes less than 2 seconds.  Neurological:     General: No focal deficit present.     Mental Status: [name] [name] is alert.  Psychiatric:        Mood and Affect: Mood normal.        Behavior: Behavior normal.        Thought Content: Thought content normal.        Judgment: Judgment normal.     Data: RIGHTCompressibilityPhasicitySpontaneityPropertiesThrombus Aging  +-----+---------------+---------+-----------+----------+--------------+  CFV Full           Yes      Yes                                  +-----+---------------+---------+-----------+----------+--------------+       ----+  LEFT    CompressibilityPhasicitySpontaneityProperties     Thrombus  Aging  +---------+---------------+---------+-----------+---------------+----------  ----+  CFV     Partial        Yes      No         rigid          Age                                                          w/compression   Indeterminate   +---------+---------------+---------+-----------+---------------+----------  ----+  SFJ     Partial         No       No         rigid          Age                                                          w/compression   Indeterminate   +---------+---------------+---------+-----------+---------------+----------  ----+  FV Prox  Partial        No       No         rigid          Age                                                          w/compression   Indeterminate   +---------+---------------+---------+-----------+---------------+----------  ----+  FV Mid   None             No       No         rigid          Age                                                          w/compression   Indeterminate   +---------+---------------+---------+-----------+---------------+----------  ----+  FV DistalNone           No       No         rigid          Age                                                          w/compression   Indeterminate   +---------+---------------+---------+-----------+---------------+----------  ----+  POP     None           No       No         rigid          Age                                                          w/compression   Indeterminate   +---------+---------------+---------+-----------+---------------+----------  ----+  PTV     Partial        No       Yes        rigid          Age                                                          w/compression   Indeterminate   +---------+---------------+---------+-----------+---------------+----------  ----+  PERO    None           No       No         rigid          Age                                                          w/compression   Indeterminate   +---------+---------------+---------+-----------+---------------+----------  ----+  GSV     Partial        No       No         rigid          Age                                                            w/compression   Indeterminate    +---------+---------------+---------+-----------+---------------+----------  ----+  SSV     None           No       No         rigid          Age                                                          w/compression   Indeterminate   +---------+---------------+---------+-----------+---------------+----------  ----+        Left Technical Findings:  Superficial edema noted in the calf. Popiteal fossa collapsed Baker's cyst  2.0 x .8 x 2.1 cm. Patency seen in the IVC at liver and mid abdomen and  left CIV proximally. Limited visualization of vein from mid CIV to mid EIV  due to overlying bowel gas. Left   distal EIV shows non-occlusive DVT.         Assessment/Plan:    46-year-old male with very large left lower extremity with recent extensive DVT and concern for underlying May Thurner syndrome.  Unknown other etiology of the PE but with recent blood in the stool I have discussed the need for evaluation with the PCP which the patient is attempting to find and we have given him information regarding this today.  She also fit him for a gentle below the knee compression sock.  Given the significant swelling in the leg with concern for underlying May Thurner's I have recommended venography from a prone popliteal or small saphenous vein approach.  This will be set up on a Monday.  Patient can continue Eliquis as long as [name] [name] is not having significant bleeding issues perioperatively.  We discussed the risk and benefits and alternatives to the procedure and need for continued compression stockings afterwards [name] [name] demonstrates good understanding.     Esra Frankowski Christopher Von Inscoe MD [facility]   

## 2022-05-12 NOTE — Progress Notes (Signed)
Patient ID: [name] [name], male   DOB: [birth year], 47 y.o.   MRN: [id number]  Reason for Consult: New Patient (Initial Visit)   Referred by No ref. provider found  Subjective:     HPI:  [name] [name] "Peaches" is a 47 y.o. male with history of diabetes, hypertension, hyperlipidemia and history of sepsis requiring long-term antibiotics.  He also has known coronary artery disease when he was very young required intervention.  Patient most recently was having issues with his arches on both feet and was given steroids about 2 months ago and then developed severe swelling of his left greater than right lower extremity.  He does have swelling in the right ankle which she states is mild but the swelling of the left leg has been persistent and causes him to have difficulty walking given the recent increase in size.  He has not worn compression stockings.  He has no personal or family history of DVT.  He has been started on Eliquis and has had trace amounts of blood in his stool.  He denies any history of cancer.  He has had recent weight loss but has been mostly sedentary and has not had much of an appetite.  Denies any recent fevers or chills.  Past Medical History:  Diagnosis Date   Anxiety    Coronary artery disease    a. 05/2014 OOH MI/PCI: LM nl, LAD 40p, 99p (3.0x28 Synergy DES) w/ R->L Collaterals, LCX nl, RCA 30p/d, EF 55% w/ sev distal inf/ant HK.   Depression    Diabetes mellitus without complication ([place])    Erectile dysfunction    H/O echocardiogram    a. 05/2014 Echo: Ef 45-50%, sev mid antsept/ant HK.   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     Pancreatitis 10/2018   Tobacco abuse    Family History  Problem Relation Age of Onset   Thyroid disease Mother    Stroke Father    Heart disease Father 40       MI, stents   Peripheral Artery Disease Father    Diabetes type II Father     Diabetes Father    Colon cancer Father    Diabetes Maternal Grandmother    Hypertension Maternal Grandfather    Hypertension Paternal Grandfather    Cancer Neg Hx    Other Neg Hx        hypogonadism   Pancreatic cancer Neg Hx    Esophageal cancer Neg Hx    Past Surgical History:  Procedure Laterality Date   CORONARY STENT PLACEMENT  05/24/2014   MID LAD DES    LEFT HEART CATHETERIZATION WITH CORONARY ANGIOGRAM N/A 05/24/2014   Procedure: LEFT HEART CATHETERIZATION WITH CORONARY ANGIOGRAM;  Surgeon: Jettie Booze, MD;  Location: [facility] CATH LAB;  Service: Cardiovascular;  Laterality: N/A;   PERCUTANEOUS CORONARY STENT INTERVENTION (PCI-S)  05/24/2014   Procedure: PERCUTANEOUS CORONARY STENT INTERVENTION (PCI-S);  Surgeon: Jettie Booze, MD;  Location: [facility] CATH LAB;  Service: Cardiovascular;;   PILONIDAL CYST EXCISION  1995    Short Social History:  Social History   Tobacco Use   Smoking status: Former    Packs/day: 1.00    Years: 23.00    Total pack years: 23.00    Types: Cigarettes    Quit date: 2020    Years since quitting: 4.1   Smokeless tobacco: Never  Tobacco comments:    currently smoking 0.5 ppd, declines patch  Substance Use Topics   Alcohol use: Yes    Alcohol/week: 24.0 standard drinks of alcohol    Types: 24 Cans of beer per week    Comment: 1/5-3/4 Fifth, 4-5 times a week.  +Guilty, cutting down; 2/4 CAGE questions.    Allergies  Allergen Reactions   Gadolinium Derivatives Nausea And Vomiting    MRI dye    Current Outpatient Medications  Medication Sig Dispense Refill   acetaminophen (TYLENOL) 500 MG tablet Take 1,000 mg by mouth every 6 (six) hours as needed for mild pain or moderate pain.     ALPRAZolam (XANAX) 0.5 MG tablet TAKE 1 TABLET(0.5 MG) BY MOUTH DAILY AS NEEDED FOR ANXIETY 30 tablet 0   apixaban (ELIQUIS) 5 MG TABS tablet Take 1 tablet (5 mg total) by mouth 2 (two) times daily. Start taking after completion of starter pack. 60 tablet  5   APIXABAN (ELIQUIS) VTE STARTER PACK ('10MG'$  AND '5MG'$ ) Take as directed on package: start with two-'5mg'$  tablets twice daily for 7 days. On day 8, switch to one-'5mg'$  tablet twice daily. 74 each 0   aspirin EC 81 MG EC tablet Take 1 tablet (81 mg total) by mouth daily.     atorvastatin (LIPITOR) 40 MG tablet TAKE ONE TABLET BY MOUTH DAILY AT 6PM (Patient taking differently: Take 40 mg by mouth daily.) 90 tablet 3   BLACK ELDERBERRY PO Take 1 tablet by mouth daily.     Continuous Blood Gluc Sensor (FREESTYLE LIBRE 3 SENSOR) MISC 1 applicator by Does not apply route every 14 (fourteen) days. 6 each 3   doxycycline (ADOXA) 100 MG tablet Take 100 mg by mouth daily as needed. Take 2 tablets in a single dose, as directed within 24 hours, but no later than 72 hours after intercourse.     emtricitabine-tenofovir (TRUVADA) 200-300 MG tablet Take 1 tablet by mouth daily.     Glucagon 3 MG/DOSE POWD Place 3 mg into the nose once as needed for up to 1 dose. 1 each 11   glucose blood (RELION TRUE METRIX TEST STRIPS) test strip Use as instructed 100 each 12   Insulin Glargine (BASAGLAR KWIKPEN) 100 UNIT/ML Inject 20 Units into the skin daily. 30 mL 3   insulin lispro (HUMALOG KWIKPEN) 200 UNIT/ML KwikPen Inject 9-12 Units into the skin 3 (three) times daily before meals. 45 mL 0   Insulin Pen Needle (PEN NEEDLES) 32G X 4 MM MISC Use as instructed to administer insulin 4X daily 400 each 0   lisinopril (ZESTRIL) 20 MG tablet Take 20 mg by mouth daily.     metoprolol tartrate (LOPRESSOR) 25 MG tablet Take 1/2 tablet (12.5 mg total) by mouth 2 (two) times daily. 90 tablet 3   Multiple Vitamin (MULTIVITAMIN WITH MINERALS) TABS tablet Take 1 tablet by mouth daily.     No current facility-administered medications for this visit.    Review of Systems  Constitutional:  Constitutional negative. HENT: HENT negative.  Eyes: Eyes negative.  Cardiovascular: Positive for leg swelling.  GI: Positive for blood in stool.   Musculoskeletal: Positive for leg pain.  Skin: Skin negative.  Neurological: Neurological negative. Hematologic: Hematologic/lymphatic negative.  Psychiatric: Psychiatric negative.        Objective:  Objective   Vitals:   05/12/22 0832  BP: 118/80  Pulse: 89  Resp: 20  Temp: 98 F (36.7 C)  SpO2: 98%  Weight: 124 lb (56.2 kg)  Height: '5\' 4"'$  (1.626 m)   Body mass index is 21.28 kg/m.  Physical Exam HENT:     Head: Normocephalic.     Nose: Nose normal.  Eyes:     Pupils: Pupils are equal, round, and reactive to light.  Cardiovascular:     Rate and Rhythm: Normal rate.  Pulmonary:     Effort: Pulmonary effort is normal.  Abdominal:     General: Abdomen is flat.  Musculoskeletal:     Right lower leg: Edema present.     Left lower leg: Edema present.     Comments: Right leg 33cm, Left leg 40cm  Skin:    Capillary Refill: Capillary refill takes less than 2 seconds.  Neurological:     General: No focal deficit present.     Mental Status: He is alert.  Psychiatric:        Mood and Affect: Mood normal.        Behavior: Behavior normal.        Thought Content: Thought content normal.        Judgment: Judgment normal.     Data: RIGHTCompressibilityPhasicitySpontaneityPropertiesThrombus Aging  +-----+---------------+---------+-----------+----------+--------------+  CFV Full           Yes      Yes                                  +-----+---------------+---------+-----------+----------+--------------+       ----+  LEFT    CompressibilityPhasicitySpontaneityProperties     Thrombus  Aging  +---------+---------------+---------+-----------+---------------+----------  ----+  CFV     Partial        Yes      No         rigid          Age                                                          w/compression   Indeterminate   +---------+---------------+---------+-----------+---------------+----------  ----+  SFJ     Partial         No       No         rigid          Age                                                          w/compression   Indeterminate   +---------+---------------+---------+-----------+---------------+----------  ----+  FV Prox  Partial        No       No         rigid          Age                                                          w/compression   Indeterminate   +---------+---------------+---------+-----------+---------------+----------  ----+  FV Mid   None  No       No         rigid          Age                                                          w/compression   Indeterminate   +---------+---------------+---------+-----------+---------------+----------  ----+  FV DistalNone           No       No         rigid          Age                                                          w/compression   Indeterminate   +---------+---------------+---------+-----------+---------------+----------  ----+  POP     None           No       No         rigid          Age                                                          w/compression   Indeterminate   +---------+---------------+---------+-----------+---------------+----------  ----+  PTV     Partial        No       Yes        rigid          Age                                                          w/compression   Indeterminate   +---------+---------------+---------+-----------+---------------+----------  ----+  PERO    None           No       No         rigid          Age                                                          w/compression   Indeterminate   +---------+---------------+---------+-----------+---------------+----------  ----+  GSV     Partial        No       No         rigid          Age  w/compression   Indeterminate    +---------+---------------+---------+-----------+---------------+----------  ----+  SSV     None           No       No         rigid          Age                                                          w/compression   Indeterminate   +---------+---------------+---------+-----------+---------------+----------  ----+        Left Technical Findings:  Superficial edema noted in the calf. Popiteal fossa collapsed Baker's cyst  2.0 x .8 x 2.1 cm. Patency seen in the IVC at liver and mid abdomen and  left CIV proximally. Limited visualization of vein from mid CIV to mid EIV  due to overlying bowel gas. Left   distal EIV shows non-occlusive DVT.         Assessment/Plan:    47 year old male with very large left lower extremity with recent extensive DVT and concern for underlying May Thurner syndrome.  Unknown other etiology of the PE but with recent blood in the stool I have discussed the need for evaluation with the PCP which the patient is attempting to find and we have given him information regarding this today.  She also fit him for a gentle below the knee compression sock.  Given the significant swelling in the leg with concern for underlying May Thurner's I have recommended venography from a prone popliteal or small saphenous vein approach.  This will be set up on a Monday.  Patient can continue Eliquis as long as he is not having significant bleeding issues perioperatively.  We discussed the risk and benefits and alternatives to the procedure and need for continued compression stockings afterwards he demonstrates good understanding.     Waynetta Sandy MD [facility]

## 2022-05-13 ENCOUNTER — Telehealth (HOSPITAL_COMMUNITY): Payer: Self-pay | Admitting: Licensed Clinical Social Worker

## 2022-05-13 ENCOUNTER — Telehealth: Payer: Self-pay

## 2022-05-13 ENCOUNTER — Other Ambulatory Visit: Payer: Self-pay

## 2022-05-13 ENCOUNTER — Encounter: Payer: Self-pay | Admitting: Internal Medicine

## 2022-05-13 ENCOUNTER — Encounter: Payer: Self-pay | Admitting: Family Medicine

## 2022-05-13 ENCOUNTER — Ambulatory Visit (INDEPENDENT_AMBULATORY_CARE_PROVIDER_SITE_OTHER): Payer: Commercial Managed Care - HMO | Admitting: Internal Medicine

## 2022-05-13 ENCOUNTER — Ambulatory Visit (INDEPENDENT_AMBULATORY_CARE_PROVIDER_SITE_OTHER): Payer: Commercial Managed Care - HMO | Admitting: Family Medicine

## 2022-05-13 VITALS — BP 118/72 | HR 71 | Ht 64.0 in | Wt 122.0 lb

## 2022-05-13 VITALS — BP 90/58 | HR 70 | Temp 98.0°F | Ht 63.25 in | Wt 122.6 lb

## 2022-05-13 DIAGNOSIS — E119 Type 2 diabetes mellitus without complications: Secondary | ICD-10-CM | POA: Diagnosis not present

## 2022-05-13 DIAGNOSIS — R195 Other fecal abnormalities: Secondary | ICD-10-CM | POA: Diagnosis not present

## 2022-05-13 DIAGNOSIS — I82422 Acute embolism and thrombosis of left iliac vein: Secondary | ICD-10-CM

## 2022-05-13 DIAGNOSIS — E785 Hyperlipidemia, unspecified: Secondary | ICD-10-CM | POA: Diagnosis not present

## 2022-05-13 DIAGNOSIS — R634 Abnormal weight loss: Secondary | ICD-10-CM

## 2022-05-13 DIAGNOSIS — K903 Pancreatic steatorrhea: Secondary | ICD-10-CM | POA: Diagnosis not present

## 2022-05-13 DIAGNOSIS — F419 Anxiety disorder, unspecified: Secondary | ICD-10-CM | POA: Diagnosis not present

## 2022-05-13 LAB — POCT GLUCOSE (DEVICE FOR HOME USE)
Glucose Fasting, POC: 46 mg/dL — AB (ref 70–99)
Glucose Fasting, POC: 71 mg/dL (ref 70–99)
POC Glucose: 91 mg/dl (ref 70–99)

## 2022-05-13 LAB — LIPID PANEL
Cholesterol: 42 mg/dL (ref 0–200)
HDL: 26 mg/dL — ABNORMAL LOW (ref >39.00)
LDL Cholesterol: 5 mg/dL (ref 0–99)
NonHDL: 16.05
Total CHOL/HDL Ratio: 2
Triglycerides: 55 mg/dL (ref 0.0–149.0)
VLDL: 11 mg/dL (ref 0.0–40.0)

## 2022-05-13 LAB — COMPREHENSIVE METABOLIC PANEL
ALT: 25 U/L (ref 0–53)
AST: 36 U/L (ref 0–37)
Albumin: 2.3 g/dL — ABNORMAL LOW (ref 3.5–5.2)
Alkaline Phosphatase: 211 U/L — ABNORMAL HIGH (ref 39–117)
BUN: 4 mg/dL — ABNORMAL LOW (ref 6–23)
CO2: 29 mEq/L (ref 19–32)
Calcium: 8 mg/dL — ABNORMAL LOW (ref 8.4–10.5)
Chloride: 101 mEq/L (ref 96–112)
Creatinine, Ser: 0.66 mg/dL (ref 0.40–1.50)
GFR: 110.83 mL/min (ref >60.00)
Glucose, Bld: 80 mg/dL (ref 70–99)
Potassium: 3.7 mEq/L (ref 3.5–5.1)
Sodium: 136 mEq/L (ref 135–145)
Total Bilirubin: 0.6 mg/dL (ref 0.2–1.2)
Total Protein: 5.3 g/dL — ABNORMAL LOW (ref 6.0–8.3)

## 2022-05-13 MED ORDER — RELION TRUE METRIX TEST STRIPS VI STRP: ORAL_STRIP | 3 refills | Status: AC

## 2022-05-13 MED ORDER — LYUMJEV KWIKPEN 200 UNIT/ML ~~LOC~~ SOPN: PEN_INJECTOR | SUBCUTANEOUS | 3 refills | Status: AC

## 2022-05-13 NOTE — Telephone Encounter (Signed)
H&V Care Navigation CSW Progress Note  Clinical Social Worker consulted to discuss financial concerns with pt getting procedure Monday.  Pt reports they told him it would cost $1,900.  CSW explained this was not due up front and might not be what insurance ends up paying for.  Pt worried about continuing into debt. CSW discussed payment plans and potential eligibility for [facility] program to reduce or erase copays based on pt financial situation.  Provided [facility] Billing # to call and discuss if he is eligible.  Pt understands importance of procedure and is agreeable to proceed at this time.  Pt states he does not have a ride to the appt and is unable to find someone to bring him or stay with him for 24 hours after.  CSW arranged [employer] and they will plan to keep pt overnight for observation.   Also reports concerns with past due rent and utilities- has been receiving assistance from his mom with those bills.  Does not have plan to pay moving forward- is applying for assistance through [facility] and is awaiting to hear- says landlord is working with him at this time.  Without plan to pay moving forward without job or disability CSW unable to offer assistance with patient care fund at this time.   SDOH Screenings   Depression (PHQ2-9): Low Risk  (04/15/2022)  Tobacco Use: Medium Risk (05/13/2022)    Jorge Ny, LCSW Clinical Social Worker [facility] Desk#: [phone] Cell#: [phone]

## 2022-05-13 NOTE — Progress Notes (Signed)
New Patient Office Visit  Subjective    Patient ID: [name] [name], male    DOB: Nov 09, 1975  Age: 47 y.o. MRN: [id number]  CC:  Chief Complaint  Patient presents with   Establish Care    HPI [name] [name] presents to establish care Pt is here to establish care. States that he used to see Dr. Redmond School. States that he sees Dr. Cruzita Lederer for diabetes, Dr. Margaretann Loveless for cardiology and Dr. Donzetta Matters for vascular surgery. Pt reports that he is having a procedure done on Monday -- will be having a venography for suspicion for Mat Thurner syndrome. Pt reports he sometimes sees blood in his stool-- is also seeing a physician for his truvada, states he regularly gets blood work done with them to check his liver function tests. I reviewed the last set of labs he had done today, LFT's are WNL. Pt reports a history of bouts of pancreatitis and his labs showed an elevated alk phos. States that he continues on the eliquis for the DVT.   DM type1 -- A1C was 7.4, has been a diabetic for about 3 years. We reviewed his CMP for today and his lipid panel. Is currently on insulin long term. 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  Anxiety-- pt reports he takes alprazolam as needed. States that he only takes them as needed. States he only take 1/2 tablet when he needs it and it is not every day. Pt states he has attempted other medications in the past but they caused a lot of side effects, pt reports he felt "like a zombie" on these other medications. I reviewed his PDMP and he is getting about 30 tablets of 0.5 mg every 5 months or so.   Abnormal consistency of stool-- pt reports that for the past 6 weeks his stool has looked greasy and orange colored, states that hehas lost a considerable amount of weight over the past year or so, states that he usually weighs somewhere in the 130's. Reports diarrhea- like stools, no nausea or abdominal pain currently. I reviewed his  last CT scan which showed an atrophic pancreas.  I reviewed all aspects of the patient's past medical, surgical, and social history. We reviewed his medications as well.   Outpatient Encounter Medications as of 05/13/2022  Medication Sig   acetaminophen (TYLENOL) 500 MG tablet Take 1,000 mg by mouth every 6 (six) hours as needed for mild pain or moderate pain.   ALPRAZolam (XANAX) 0.5 MG tablet TAKE 1 TABLET(0.5 MG) BY MOUTH DAILY AS NEEDED FOR ANXIETY   apixaban (ELIQUIS) 5 MG TABS tablet Take 1 tablet (5 mg total) by mouth 2 (two) times daily. Start taking after completion of starter pack.   APIXABAN (ELIQUIS) VTE STARTER PACK (10MG  AND 5MG ) Take as directed on package: start with two-5mg  tablets twice daily for 7 days. On day 8, switch to one-5mg  tablet twice daily.   aspirin EC 81 MG EC tablet Take 1 tablet (81 mg total) by mouth daily.   atorvastatin (LIPITOR) 40 MG tablet TAKE ONE TABLET BY MOUTH DAILY AT 6PM   BLACK ELDERBERRY PO Take 1,000 mg by mouth daily.   Continuous Blood Gluc Sensor (FREESTYLE LIBRE 3 SENSOR) MISC 1 applicator by Does not apply route every 14 (fourteen) days.   doxycycline (ADOXA) 100 MG tablet Take 200 mg by mouth daily as needed. Take 2 tablets in a single dose, as directed within 24 hours, but no later  than 72 hours after intercourse.   emtricitabine-tenofovir (TRUVADA) 200-300 MG tablet Take 1 tablet by mouth daily.   Glucagon 3 MG/DOSE POWD Place 3 mg into the nose once as needed for up to 1 dose.   glucose blood (RELION TRUE METRIX TEST STRIPS) test strip Use as instructed 2x a day   Insulin Glargine (BASAGLAR KWIKPEN) 100 UNIT/ML Inject 20 Units into the skin daily. (Patient taking differently: Inject 12-18 Units into the skin daily.)   insulin lispro (HUMALOG KWIKPEN) 200 UNIT/ML KwikPen Inject 9-12 Units into the skin 3 (three) times daily before meals. (Patient taking differently: Inject 4-7 Units into the skin 3 (three) times daily before meals.)   Insulin  Lispro-aabc (LYUMJEV KWIKPEN) 200 UNIT/ML KwikPen Inject 7-9 units 3x a day before meals   Insulin Pen Needle (PEN NEEDLES) 32G X 4 MM MISC Use as instructed to administer insulin 4X daily   lisinopril (ZESTRIL) 20 MG tablet Take 20 mg by mouth daily.   metoprolol tartrate (LOPRESSOR) 25 MG tablet Take 1/2 tablet (12.5 mg total) by mouth 2 (two) times daily.   Multiple Vitamin (MULTIVITAMIN WITH MINERALS) TABS tablet Take 1 tablet by mouth daily.   Salicylic Acid, Acne, (SALICYLIC ACID EX) Apply 1 Application topically daily as needed (acne).   No facility-administered encounter medications on file as of 05/13/2022.    Past Medical History:  Diagnosis Date   Anxiety    Coronary artery disease    a. 05/2014 OOH MI/PCI: LM nl, LAD 40p, 99p (3.0x28 Synergy DES) w/ R->L Collaterals, LCX nl, RCA 30p/d, EF 55% w/ sev distal inf/ant HK.   Depression    Diabetes mellitus without complication ([place])    Erectile dysfunction    H/O echocardiogram    a. 05/2014 Echo: Ef 45-50%, sev mid antsept/ant HK.   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     Pancreatitis 10/2018   Tobacco abuse     Past Surgical History:  Procedure Laterality Date   CORONARY STENT PLACEMENT  05/24/2014   MID LAD DES    LEFT HEART CATHETERIZATION WITH CORONARY ANGIOGRAM N/A 05/24/2014   Procedure: LEFT HEART CATHETERIZATION WITH CORONARY ANGIOGRAM;  Surgeon: Jettie Booze, MD;  Location: [facility] CATH LAB;  Service: Cardiovascular;  Laterality: N/A;   PERCUTANEOUS CORONARY STENT INTERVENTION (PCI-S)  05/24/2014   Procedure: PERCUTANEOUS CORONARY STENT INTERVENTION (PCI-S);  Surgeon: Jettie Booze, MD;  Location: [facility] CATH LAB;  Service: Cardiovascular;;   PILONIDAL CYST EXCISION  1995    Family History  Problem Relation Age of Onset   Thyroid disease Mother    Stroke Father    Heart disease Father 23       MI, stents    Peripheral Artery Disease Father    Diabetes type II Father    Diabetes Father    Colon cancer Father    Diabetes Maternal Grandmother    Hypertension Maternal Grandfather    Hypertension Paternal Grandfather    Cancer Neg Hx    Other Neg Hx        hypogonadism   Pancreatic cancer Neg Hx    Esophageal cancer Neg Hx     Social History   Socioeconomic History   Marital status: Single    Spouse name: Not on file   Number of children: Not on file   Years of education: Not on file   Highest education  level: Not on file  Occupational History   Occupation: Secondary school teacher  Tobacco Use   Smoking status: Former    Packs/day: 1.00    Years: 23.00    Total pack years: 23.00    Types: Cigarettes    Quit date: 2020    Years since quitting: 4.1   Smokeless tobacco: Never   Tobacco comments:    currently smoking 0.5 ppd, declines patch  Vaping Use   Vaping Use: Never used  Substance and Sexual Activity   Alcohol use: Yes    Alcohol/week: 24.0 standard drinks of alcohol    Types: 24 Cans of beer per week    Comment: 1/5-3/4 Fifth, 4-5 times a week.  +Guilty, cutting down; 2/4 CAGE questions.   Drug use: No   Sexual activity: Yes    Partners: Male  Other Topics Concern   Not on file  Social History Narrative   Lives at home with roommate, exercise at gym in apartment complex, diet - no discretion.       Social Determinants of Health   Financial Resource Strain: High Risk (05/13/2022)   Overall Financial Resource Strain (CARDIA)    Difficulty of Paying Living Expenses: Hard  Food Insecurity: Not on file  Transportation Needs: Unmet Transportation Needs (05/13/2022)   PRAPARE - Hydrologist (Medical): Yes    Lack of Transportation (Non-Medical): Yes  Physical Activity: Not on file  Stress: Not on file  Social Connections: Not on file  Intimate Partner Violence: Not on file    Review of Systems  All other systems reviewed and are  negative.       Objective    BP (!) 90/58 (BP Location: Right Arm, Patient Position: Sitting, Cuff Size: Normal)   Pulse 70   Temp 98 F (36.7 C) (Oral)   Ht 5' 3.25" (1.607 m)   Wt 122 lb 9.6 oz (55.6 kg)   SpO2 98%   BMI 21.55 kg/m   Physical Exam Vitals reviewed.  Constitutional:      Appearance: Normal appearance. He is well-groomed and normal weight.  Eyes:     Extraocular Movements: Extraocular movements intact.     Conjunctiva/sclera: Conjunctivae normal.  Neck:     Thyroid: No thyromegaly.  Cardiovascular:     Rate and Rhythm: Normal rate and regular rhythm.     Heart sounds: S1 normal and S2 normal. No murmur heard. Pulmonary:     Effort: Pulmonary effort is normal.     Breath sounds: Normal breath sounds and air entry. No rales.  Abdominal:     General: Abdomen is flat. Bowel sounds are normal.  Musculoskeletal:     Right lower leg: No edema.     Left lower leg: No edema.  Neurological:     General: No focal deficit present.     Mental Status: He is alert and oriented to person, place, and time.     Gait: Gait is intact.  Psychiatric:        Mood and Affect: Mood and affect normal.     Last metabolic panel Lab Results  Component Value Date   GLUCOSE 80 05/13/2022   GLUCOSE 80 05/13/2022   NA 136 05/13/2022   K 3.7 05/13/2022   CL 101 05/13/2022   CO2 29 05/13/2022   BUN 4 (L) 05/13/2022   CREATININE 0.66 05/13/2022   EGFR 108 11/11/2021   CALCIUM 8.0 (L) 05/13/2022   PROT 5.3 (L) 05/13/2022  ALBUMIN 2.3 (L) 05/13/2022   LABGLOB 2.9 11/11/2021   AGRATIO 1.2 11/11/2021   BILITOT 0.6 05/13/2022   ALKPHOS 211 (H) 05/13/2022   AST 36 05/13/2022   ALT 25 05/13/2022   ANIONGAP 8 10/29/2021   Last lipids Lab Results  Component Value Date   CHOL 42 05/13/2022   HDL 26.00 (L) 05/13/2022   LDLCALC 5 05/13/2022   TRIG 55.0 05/13/2022   CHOLHDL 2 05/13/2022        Assessment & Plan:   Problem List Items Addressed This Visit        Unprioritized   Anxiety    We had a long discussion about the long term use of benzodiazepines and the risks associated with this medication. I reviewed his PDMP and he is only getting 0.5 mg of alprazolam, 30 tablets every 5 months or so. I have reviewed the controlled substance agreement with the patient and this was signed. He will continue to see me every 6 months for medication refills.       Other Visit Diagnoses     Abnormal stool color    -  Primary   Relevant Orders   My suspicion is very high for pancreatic insufficiency causing significant malabsorption. His albumin is low on his CMP and he is having significant changes in his stool color and consistency. I have ordered stool studies listed below to begin the work up on this condition. He has already evidence of pancreatic atrophy on his CT scan from his multiple bouts of pancreatitis and he is a type 1 diabetic, indicating pancreatic failure.   Stool culture   Ova and Parasite Exam   Fecal fat, qualitative([facility])   Fecal Occult Blood, Guaiac   Weight loss       Relevant Orders   Vitamin D, 25-hydroxy   B12 and Folate Panel   TSH   Pancreatic steatorrhea       Relevant Orders   Stool culture   Ova and Parasite Exam   Fecal fat, qualitative([facility])   Fecal Occult Blood, Guaiac   Vitamin D, 25-hydroxy   B12 and Folate Panel      I spent 45 minutes today with the patient reviewing his extensive past medical history, discussing his anxiety disorder and discussing his BM's and ordering testing. I also reveiwed Dr. Arman Filter notes and the vascular surgeon's notes, imaging and laboratory work.  Return in about 6 months (around 11/13/2022).   Farrel Conners, MD

## 2022-05-13 NOTE — Progress Notes (Addendum)
Patient ID: [name] [name], male   DOB: 02-Nov-1975, 47 y.o.   MRN: [id number]  HPI: [name] [name] is a 47 y.o.-year-old male, returning for follow-up for DM2, dx in 2021, insulin-dependent since 2022, uncontrolled, with complications (CAD, CHF, ED, DKA). Pt. previously saw Dr. Loanne Drilling,  but last visit with me 3 months ago.  Interim history: No increased urination, blurry vision, nausea, chest pain.  Reviewed HbA1c: 11/30/2021: HbA1c 8.3% Lab Results  Component Value Date   HGBA1C 8.8 (A) 07/06/2021   HGBA1C 10.9 (A) 05/26/2021   HGBA1C 8.0 (A) 02/08/2020   HGBA1C 10.2 (H) 12/11/2019   HGBA1C 8.8 (A) 11/15/2019   HGBA1C 6.3 (H) 05/30/2019   HGBA1C 5.4 04/19/2014   At last visit he was on: - Basaglar 20 units at bedtime - at most 1-2x a week  - Humalog 7-10 >> 9-12 units 3 times a day before meals >> only taking this for correction! - 5-7 units  We changed to: - Basaglar 20 units at night >> off completely - stopped since last visit - Humalog before meals: 7-9 units (1:25)  - taken at the start of the meal - sliding scale for Humalog: - 150-175: + 1 unit  - 176-200: + 2 units  - 201-225: + 3 units  - >220: + 4 units   Pt checks his sugars >4x a day and they are:  Previously:  Lowest sugar was 43 >> 40; he has hypoglycemia awareness at 50s.  Highest sugar was 400 >> >300. Reviewed outside records [facility]):  Other symptoms include Recent admission for DKA august [id number] ([id number]), complicated by PNA. History of alcohol abuse with Pancreatitis, HIV + managed on Truvada. He has continued to drink but reports 3-4 drinks per week    Glucometer: One Touch Verio  Pt's meals are: - Breakfast: eggs + bacon - Lunch: home cooked or sandwiich - Dinner: meat + veggies - Snacks: carb smart icecream, no sweet drinks  - no CKD, last BUN/creatinine:  Lab Results  Component Value Date   BUN 6 11/11/2021   BUN 24 (H) 10/29/2021   CREATININE 0.84 11/11/2021    CREATININE 1.09 10/29/2021  On lisinopril 40 mg daily.  -+HL; last set of lipids: Lab Results  Component Value Date   CHOL 149 07/07/2021   HDL 51 07/07/2021   LDLCALC 81 07/07/2021   TRIG 87 07/07/2021   CHOLHDL 2.9 07/07/2021  On Lipitor 40 mg daily.  - last eye exam was in 02/25/2022. No DR reportedly.   - no numbness and tingling in his feet.  Last foot exam 07/06/2021.  He also has a history of hypogonadism.  He was taken off testosterone due to polycythemia. He has a history of HTN, HIV, neurosyphilis, anxiety.  OSA was suspected, however, a sleep study was declined by his insurance. He has a history of pancreatitis x2: 2021 and 07/2021.  Also, history of alcohol use.  ROS: + see HPI  Past Medical History:  Diagnosis Date   Anxiety    Coronary artery disease    a. 05/2014 OOH MI/PCI: LM nl, LAD 40p, 99p (3.0x28 Synergy DES) w/ R->L Collaterals, LCX nl, RCA 30p/d, EF 55% w/ sev distal inf/ant HK.   Depression    Diabetes mellitus without complication ([place])    Erectile dysfunction    H/O echocardiogram    a. 05/2014 Echo: Ef 45-50%, sev mid antsept/ant HK.   Hyperlipidemia    Hypertension    medication in 2012, stress as a big  component   Neurosyphilis in male    hospitalization 04/19/2014.     Pancreatitis 10/2018   Tobacco abuse    Past Surgical History:  Procedure Laterality Date   CORONARY STENT PLACEMENT  05/24/2014   MID LAD DES    LEFT HEART CATHETERIZATION WITH CORONARY ANGIOGRAM N/A 05/24/2014   Procedure: LEFT HEART CATHETERIZATION WITH CORONARY ANGIOGRAM;  Surgeon: Jettie Booze, MD;  Location: [facility] CATH LAB;  Service: Cardiovascular;  Laterality: N/A;   PERCUTANEOUS CORONARY STENT INTERVENTION (PCI-S)  05/24/2014   Procedure: PERCUTANEOUS CORONARY STENT INTERVENTION (PCI-S);  Surgeon: Jettie Booze, MD;  Location: [facility] CATH LAB;  Service: Cardiovascular;;   PILONIDAL CYST EXCISION  1995   Social History   Socioeconomic History   Marital status:  Single    Spouse name: Not on file   Number of children: Not on file   Years of education: Not on file   Highest education level: Not on file  Occupational History   Occupation: Secondary school teacher  Tobacco Use   Smoking status: Former    Packs/day: 1.00    Years: 23.00    Total pack years: 23.00    Types: Cigarettes    Quit date: 2020    Years since quitting: 4.1   Smokeless tobacco: Never   Tobacco comments:    currently smoking 0.5 ppd, declines patch  Vaping Use   Vaping Use: Never used  Substance and Sexual Activity   Alcohol use: Yes    Alcohol/week: 24.0 standard drinks of alcohol    Types: 24 Cans of beer per week    Comment: 1/5-3/4 Fifth, 4-5 times a week.  +Guilty, cutting down; 2/4 CAGE questions.   Drug use: No   Sexual activity: Yes    Partners: Male  Other Topics Concern   Not on file  Social History Narrative   Lives at home with roommate, exercise at gym in apartment complex, diet - no discretion.       Social Determinants of Health   Financial Resource Strain: Not on file  Food Insecurity: Not on file  Transportation Needs: Not on file  Physical Activity: Not on file  Stress: Not on file  Social Connections: Not on file  Intimate Partner Violence: Not on file   Current Outpatient Medications on File Prior to Visit  Medication Sig Dispense Refill   acetaminophen (TYLENOL) 500 MG tablet Take 1,000 mg by mouth every 6 (six) hours as needed for mild pain or moderate pain.     ALPRAZolam (XANAX) 0.5 MG tablet TAKE 1 TABLET(0.5 MG) BY MOUTH DAILY AS NEEDED FOR ANXIETY 30 tablet 0   apixaban (ELIQUIS) 5 MG TABS tablet Take 1 tablet (5 mg total) by mouth 2 (two) times daily. Start taking after completion of starter pack. 60 tablet 5   APIXABAN (ELIQUIS) VTE STARTER PACK ('10MG'$  AND '5MG'$ ) Take as directed on package: start with two-'5mg'$  tablets twice daily for 7 days. On day 8, switch to one-'5mg'$  tablet twice daily. 74 each 0   aspirin EC 81 MG EC tablet  Take 1 tablet (81 mg total) by mouth daily.     atorvastatin (LIPITOR) 40 MG tablet TAKE ONE TABLET BY MOUTH DAILY AT 6PM (Patient taking differently: Take 40 mg by mouth daily.) 90 tablet 3   BLACK ELDERBERRY PO Take 1 tablet by mouth daily.     Continuous Blood Gluc Sensor (FREESTYLE LIBRE 3 SENSOR) MISC 1 applicator by Does not apply route every 14 (fourteen) days. 6 each  3   doxycycline (ADOXA) 100 MG tablet Take 100 mg by mouth daily as needed. Take 2 tablets in a single dose, as directed within 24 hours, but no later than 72 hours after intercourse.     emtricitabine-tenofovir (TRUVADA) 200-300 MG tablet Take 1 tablet by mouth daily.     Glucagon 3 MG/DOSE POWD Place 3 mg into the nose once as needed for up to 1 dose. 1 each 11   glucose blood (RELION TRUE METRIX TEST STRIPS) test strip Use as instructed 100 each 12   Insulin Glargine (BASAGLAR KWIKPEN) 100 UNIT/ML Inject 20 Units into the skin daily. 30 mL 3   insulin lispro (HUMALOG KWIKPEN) 200 UNIT/ML KwikPen Inject 9-12 Units into the skin 3 (three) times daily before meals. 45 mL 0   Insulin Pen Needle (PEN NEEDLES) 32G X 4 MM MISC Use as instructed to administer insulin 4X daily 400 each 0   lisinopril (ZESTRIL) 20 MG tablet Take 20 mg by mouth daily.     metoprolol tartrate (LOPRESSOR) 25 MG tablet Take 1/2 tablet (12.5 mg total) by mouth 2 (two) times daily. 90 tablet 3   Multiple Vitamin (MULTIVITAMIN WITH MINERALS) TABS tablet Take 1 tablet by mouth daily.     No current facility-administered medications on file prior to visit.   Allergies  Allergen Reactions   Gadolinium Derivatives Nausea And Vomiting    MRI dye   Family History  Problem Relation Age of Onset   Thyroid disease Mother    Stroke Father    Heart disease Father 62       MI, stents   Peripheral Artery Disease Father    Diabetes type II Father    Diabetes Father    Colon cancer Father    Diabetes Maternal Grandmother    Hypertension Maternal Grandfather     Hypertension Paternal Grandfather    Cancer Neg Hx    Other Neg Hx        hypogonadism   Pancreatic cancer Neg Hx    Esophageal cancer Neg Hx    PE: BP 118/72 (BP Location: Left Arm, Patient Position: Sitting, Cuff Size: Normal)   Pulse 71   Ht '5\' 4"'$  (1.626 m)   Wt 122 lb (55.3 kg)   SpO2 99%   BMI 20.94 kg/m  Wt Readings from Last 3 Encounters:  05/13/22 122 lb (55.3 kg)  05/12/22 124 lb (56.2 kg)  04/28/22 122 lb 12.8 oz (55.7 kg)   Constitutional: thin, in NAD Eyes:  EOMI, no exophthalmos ENT: no neck masses, no cervical lymphadenopathy Cardiovascular: RRR, No MRG Respiratory: CTA B Musculoskeletal: no deformities Skin:no rashes Neurological: + tremor with outstretched hands  ASSESSMENT: 1. DM2, non-insulin-dependent, uncontrolled, with complications: - DKA [id number] - CAD, s/p AMI 2016, s/p stent - CHF - ED  2. HL  PLAN:  1. Patient with longstanding, uncontrolled, insulin-dependent diabetes, on basal/bolus insulin regimen, with suboptimal control.  At last check in 11/2021, HbA1c was slightly lower, but still above target, at 8.3%.  Reviewing the CGM trends at that time, sugars were improving overnight but they were increasing after breakfast and then after every subsequent meal, with more pronounced peaks after lunch and in the middle of the night.  Upon questioning, he was not taking Basaglar every night, but missing 1 to 2 days a week.  We discussed about the importance of taking this daily.  I also gave him a sliding scale of Humalog and changed his mealtime Humalog dose.  He  did have some low blood sugars after meals and we discussed that this may have been due to not taking the Humalog 15 minutes before the meal. CGM interpretation: -At today's visit, we reviewed his CGM downloads: It appears that 43% of values are in target range (goal >70%), while 54% are higher than 180 (goal <25%), and 3% are lower than 70 (goal <4%).  The calculated average blood sugar is  198.  The projected HbA1c for the next 3 months (GMI) is 8.0%. -Reviewing the CGM trends, sugars are very fluctuating, more so than at last visit, around the upper limit of the target range, 180.  Upon questioning, this is due to the fact that he has been off his Basaglar as he noticed low blood sugars during the night when he was taking it.  We discussed that he may need a lower dose, but he absolutely needs to take it.  I recommended starting this again at a low dose of 12 units and taking it in the morning to avoid drops in blood sugars overnight.  If the sugars remain high, I advised him to increase the Basaglar dose slightly. -He did not obtain Lyumjev since last visit.  However, he is taking Humalog at the start of the meal as he could not remember that he had to take it 15 minutes before.  At today's visit I sent a prescription for Lyumjev to his pharmacy and we discussed about taking this at the start of the meal.  If this is not covered and we go back to Humalog, he needs to take it 15 minutes before.  He is using a very conservative 1: 25 insulin to carb ratio.  I advised him to try 1: 20. - I suggested to:  Patient Instructions  Please restart: - Basaglar 12 units in am (may need up to 18 units if sugars remain high)  Use: - Lyumjev before meals: insulin to carb ratio 1:20 - sliding scale for Humalog: - 150-175: + 1 unit  - 176-200: + 2 units  - 201-225: + 3 units  - >220: + 4 units   Please return in 3-4 months.  - we checked his HbA1c: 7.4% (lower) - advised to check sugars at different times of the day - 4x a day, rotating check times - advised for yearly eye exams >> he is UTD - At today's visit, we will check his insulin production and in the pancreatic antibodies.  However, he had a low blood sugar in the 50s during the visit so he ate 2 mandarins and we gave him juice.  Sugars improved before he went to the lab. - return to clinic in 3-4 months  2. HL -Reviewed latest lipid  panel from 07/2021: LDL above target of less than 55, the rest of the fractions at goal: Lab Results  Component Value Date   CHOL 149 07/07/2021   HDL 51 07/07/2021   LDLCALC 81 07/07/2021   TRIG 87 07/07/2021   CHOLHDL 2.9 07/07/2021  -He continues on Lipitor 40 mg daily (started before last visit), without side effects  Component     Latest Ref Rng 05/13/2022 05/18/2022  Sodium     135 - 145 mEq/L 136    Potassium     3.5 - 5.1 mEq/L 3.7    Chloride     96 - 112 mEq/L 101    CO2     19 - 32 mEq/L 29    Glucose     70 -  99 mg/dL 80    Glucose     65 - 99 mg/dL 80    BUN     6 - 23 mg/dL 4 (L)    Creatinine     0.40 - 1.50 mg/dL 0.66    Total Bilirubin     0.2 - 1.2 mg/dL 0.6    Alkaline Phosphatase     39 - 117 U/L 211 (H)    AST     0 - 37 U/L 36    ALT     0 - 53 U/L 25    Total Protein     6.0 - 8.3 g/dL 5.3 (L)    Albumin     3.5 - 5.2 g/dL 2.3 (L)    GFR     >60.00 mL/min 110.83    Calcium     8.4 - 10.5 mg/dL 8.0 (L)    Cholesterol     0 - 200 mg/dL 42    Triglycerides     0.0 - 149.0 mg/dL 55.0    HDL Cholesterol     >39.00 mg/dL 26.00 (L)    VLDL     0.0 - 40.0 mg/dL 11.0    LDL (calc)     0 - 99 mg/dL 5    Total CHOL/HDL Ratio 2    NonHDL 16.05    C-Peptide     0.80 - 3.85 ng/mL 0.20 (L)    IA-2 Antibody     <5.4 U/mL <5.4    ZNT8 Antibodies     <15 U/mL <10    Glutamic Acid Decarb Ab     <5 IU/mL <5    Glucose-Capillary     70 - 99 mg/dL  198 (H)     His C-peptide is low, pointing towards a diagnosis of type 1 diabetes, however, his antipancreatic Ab's are not elevated.   Serum proteins are low and as a consequence, uncorrected calcium is also low.  Will recommend to increase protein in diet. Alkaline phosphatase is elevated.  Would suggest a multivitamin. HDL cholesterol is low.  LDL is very low, while triglycerides are at goal.  Philemon Kingdom, MD PhD [facility]

## 2022-05-13 NOTE — Telephone Encounter (Signed)
Pt called to discuss r/s his surgery, as he feels he does not want to add to his bills and is "already in the hole" on other bills. I have reached out to LCSW to speak with him about this. Pt is aware and appreciative.

## 2022-05-13 NOTE — Telephone Encounter (Signed)
Pt called [place] back and I have returned his call and LVM requesting that he call us back.

## 2022-05-13 NOTE — Telephone Encounter (Signed)
Attempted to reach pt to schedule his procedure. LVM for him to call us back.

## 2022-05-13 NOTE — Patient Instructions (Addendum)
Please restart: - Basaglar 12 units in am (may need up to 18 units if sugars remain high)  Use: - Lyumjev before meals: insulin to carb ratio 1:20 - sliding scale for Humalog: - 150-175: + 1 unit  - 176-200: + 2 units  - 201-225: + 3 units  - >220: + 4 units   Please return in 3-4 months.

## 2022-05-14 ENCOUNTER — Other Ambulatory Visit: Payer: Commercial Managed Care - HMO

## 2022-05-14 NOTE — Assessment & Plan Note (Signed)
We had a long discussion about the long term use of benzodiazepines and the risks associated with this medication. I reviewed his PDMP and he is only getting 0.5 mg of alprazolam, 30 tablets every 5 months or so. I have reviewed the controlled substance agreement with the patient and this was signed. He will continue to see me every 6 months for medication refills.

## 2022-05-17 ENCOUNTER — Ambulatory Visit (HOSPITAL_COMMUNITY)
Admission: RE | Disposition: A | Discharge status: Home / Self Care | Payer: Self-pay | Source: Home / Self Care | Attending: Vascular Surgery

## 2022-05-17 ENCOUNTER — Telehealth: Payer: Self-pay

## 2022-05-17 ENCOUNTER — Other Ambulatory Visit: Payer: Commercial Managed Care - HMO

## 2022-05-17 ENCOUNTER — Other Ambulatory Visit: Payer: Self-pay

## 2022-05-17 ENCOUNTER — Observation Stay (HOSPITAL_COMMUNITY)
Admission: RE | Admit: 2022-05-17 | Discharge: 2022-05-18 | Disposition: A | Discharge status: Home / Self Care | Payer: Commercial Managed Care - HMO | Attending: Vascular Surgery | Admitting: Vascular Surgery

## 2022-05-17 DIAGNOSIS — E119 Type 2 diabetes mellitus without complications: Secondary | ICD-10-CM | POA: Diagnosis not present

## 2022-05-17 DIAGNOSIS — I10 Essential (primary) hypertension: Secondary | ICD-10-CM | POA: Diagnosis not present

## 2022-05-17 DIAGNOSIS — E785 Hyperlipidemia, unspecified: Secondary | ICD-10-CM | POA: Diagnosis not present

## 2022-05-17 DIAGNOSIS — Z794 Long term (current) use of insulin: Secondary | ICD-10-CM | POA: Insufficient documentation

## 2022-05-17 DIAGNOSIS — Z7901 Long term (current) use of anticoagulants: Secondary | ICD-10-CM | POA: Insufficient documentation

## 2022-05-17 DIAGNOSIS — Z87891 Personal history of nicotine dependence: Secondary | ICD-10-CM | POA: Diagnosis not present

## 2022-05-17 DIAGNOSIS — I82409 Acute embolism and thrombosis of unspecified deep veins of unspecified lower extremity: Secondary | ICD-10-CM | POA: Diagnosis present

## 2022-05-17 DIAGNOSIS — I82402 Acute embolism and thrombosis of unspecified deep veins of left lower extremity: Secondary | ICD-10-CM | POA: Diagnosis not present

## 2022-05-17 DIAGNOSIS — I82492 Acute embolism and thrombosis of other specified deep vein of left lower extremity: Principal | ICD-10-CM | POA: Insufficient documentation

## 2022-05-17 DIAGNOSIS — Z792 Long term (current) use of antibiotics: Secondary | ICD-10-CM | POA: Insufficient documentation

## 2022-05-17 DIAGNOSIS — Z955 Presence of coronary angioplasty implant and graft: Secondary | ICD-10-CM | POA: Diagnosis not present

## 2022-05-17 DIAGNOSIS — I2510 Atherosclerotic heart disease of native coronary artery without angina pectoris: Secondary | ICD-10-CM | POA: Insufficient documentation

## 2022-05-17 DIAGNOSIS — I82422 Acute embolism and thrombosis of left iliac vein: Secondary | ICD-10-CM

## 2022-05-17 HISTORY — PX: PERIPHERAL VASCULAR BALLOON ANGIOPLASTY: CATH118281

## 2022-05-17 HISTORY — PX: PERIPHERAL VASCULAR THROMBECTOMY: CATH118306

## 2022-05-17 HISTORY — PX: INTRAVASCULAR ULTRASOUND/IVUS: CATH118244

## 2022-05-17 HISTORY — PX: LOWER EXTREMITY VENOGRAPHY: CATH118253

## 2022-05-17 LAB — POCT I-STAT, CHEM 8
BUN: 5 mg/dL — ABNORMAL LOW (ref 6–20)
Calcium, Ion: 0.97 mmol/L — ABNORMAL LOW (ref 1.15–1.40)
Chloride: 101 mmol/L (ref 98–111)
Creatinine, Ser: 0.7 mg/dL (ref 0.61–1.24)
Glucose, Bld: 122 mg/dL — ABNORMAL HIGH (ref 70–99)
HCT: 37 % — ABNORMAL LOW (ref 39.0–52.0)
Hemoglobin: 12.6 g/dL — ABNORMAL LOW (ref 13.0–17.0)
Potassium: 4 mmol/L (ref 3.5–5.1)
Sodium: 139 mmol/L (ref 135–145)
TCO2: 28 mmol/L (ref 22–32)

## 2022-05-17 LAB — APTT: aPTT: 37 seconds — ABNORMAL HIGH (ref 24–36)

## 2022-05-17 LAB — GLUCOSE, CAPILLARY
Glucose-Capillary: 93 mg/dL (ref 70–99)
Glucose-Capillary: 242 mg/dL — ABNORMAL HIGH (ref 70–99)

## 2022-05-17 LAB — HEPARIN LEVEL (UNFRACTIONATED): Heparin Unfractionated: <0.1 IU/mL — ABNORMAL LOW (ref 0.30–0.70)

## 2022-05-17 SURGERY — LOWER EXTREMITY VENOGRAPHY
Anesthesia: LOCAL

## 2022-05-17 MED ORDER — HEPARIN (PORCINE) 25000 UT/250ML-% IV SOLN
850.0000 [IU]/h | INTRAVENOUS | Status: DC
  Administered 2022-05-17: 850 [IU]/h via INTRAVENOUS
  Filled 2022-05-17: qty 250

## 2022-05-17 MED ORDER — LIDOCAINE HCL (PF) 1 % IJ SOLN
INTRAMUSCULAR | Status: DC | PRN
  Administered 2022-05-17: 5 mL

## 2022-05-17 MED ORDER — FENTANYL CITRATE (PF) 100 MCG/2ML IJ SOLN
INTRAMUSCULAR | Status: AC
  Filled 2022-05-17: qty 2

## 2022-05-17 MED ORDER — MIDAZOLAM HCL 2 MG/2ML IJ SOLN
INTRAMUSCULAR | Status: DC | PRN
  Administered 2022-05-17 (×3): 1 mg via INTRAVENOUS

## 2022-05-17 MED ORDER — SODIUM CHLORIDE 0.9 % IV SOLN: INTRAVENOUS | Status: DC

## 2022-05-17 MED ORDER — LABETALOL HCL 5 MG/ML IV SOLN: 10.0000 mg | INTRAVENOUS | Status: DC | PRN

## 2022-05-17 MED ORDER — OXYCODONE HCL 5 MG PO TABS
5.0000 mg | ORAL_TABLET | ORAL | Status: DC | PRN
  Administered 2022-05-17: 5 mg via ORAL
  Filled 2022-05-17: qty 1

## 2022-05-17 MED ORDER — LIDOCAINE HCL (PF) 1 % IJ SOLN
INTRAMUSCULAR | Status: AC
  Filled 2022-05-17: qty 30

## 2022-05-17 MED ORDER — HYDRALAZINE HCL 20 MG/ML IJ SOLN: 5.0000 mg | INTRAMUSCULAR | Status: DC | PRN

## 2022-05-17 MED ORDER — INSULIN ASPART 100 UNIT/ML IJ SOLN
0.0000 [IU] | Freq: Three times a day (TID) | INTRAMUSCULAR | Status: DC
  Administered 2022-05-18 (×2): 3 [IU] via SUBCUTANEOUS

## 2022-05-17 MED ORDER — HEPARIN SODIUM (PORCINE) 1000 UNIT/ML IJ SOLN
INTRAMUSCULAR | Status: DC | PRN
  Administered 2022-05-17: 6500 [IU] via INTRAVENOUS

## 2022-05-17 MED ORDER — ACETAMINOPHEN 325 MG PO TABS: 650.0000 mg | ORAL_TABLET | ORAL | Status: DC | PRN

## 2022-05-17 MED ORDER — MIDAZOLAM HCL 5 MG/5ML IJ SOLN
INTRAMUSCULAR | Status: AC
  Filled 2022-05-17: qty 5

## 2022-05-17 MED ORDER — ONDANSETRON HCL 4 MG/2ML IJ SOLN: 4.0000 mg | Freq: Four times a day (QID) | INTRAMUSCULAR | Status: DC | PRN

## 2022-05-17 MED ORDER — IODIXANOL 320 MG/ML IV SOLN
INTRAVENOUS | Status: DC | PRN
  Administered 2022-05-17: 60 mL

## 2022-05-17 MED ORDER — SODIUM CHLORIDE 0.9 % IV SOLN: 250.0000 mL | INTRAVENOUS | Status: DC | PRN

## 2022-05-17 MED ORDER — HEPARIN (PORCINE) IN NACL 1000-0.9 UT/500ML-% IV SOLN
INTRAVENOUS | Status: DC | PRN
  Administered 2022-05-17 (×2): 500 mL

## 2022-05-17 MED ORDER — FENTANYL CITRATE (PF) 100 MCG/2ML IJ SOLN
INTRAMUSCULAR | Status: DC | PRN
  Administered 2022-05-17 (×4): 25 ug via INTRAVENOUS

## 2022-05-17 MED ORDER — MORPHINE SULFATE (PF) 2 MG/ML IV SOLN: 2.0000 mg | INTRAVENOUS | Status: DC | PRN

## 2022-05-17 MED ORDER — SODIUM CHLORIDE 0.9% FLUSH
3.0000 mL | Freq: Two times a day (BID) | INTRAVENOUS | Status: DC
  Administered 2022-05-17 – 2022-05-18 (×2): 3 mL via INTRAVENOUS

## 2022-05-17 MED ORDER — SODIUM CHLORIDE 0.9% FLUSH: 3.0000 mL | INTRAVENOUS | Status: DC | PRN

## 2022-05-17 MED ORDER — SODIUM CHLORIDE 0.9 % IV SOLN: INTRAVENOUS | Status: AC

## 2022-05-17 MED ORDER — INSULIN ASPART 100 UNIT/ML IJ SOLN
0.0000 [IU] | Freq: Every day | INTRAMUSCULAR | Status: DC
  Administered 2022-05-17: 2 [IU] via SUBCUTANEOUS

## 2022-05-17 SURGICAL SUPPLY — 25 items
BALLN MUSTANG 10X60X135 (BALLOONS) ×3
BALLN MUSTANG 12X60X135 (BALLOONS) ×3
BALLN MUSTANG 8X80X75 (BALLOONS) ×3
BALLOON MUSTANG 10X60X135 (BALLOONS) IMPLANT
BALLOON MUSTANG 12X60X135 (BALLOONS) IMPLANT
BALLOON MUSTANG 8X80X75 (BALLOONS) IMPLANT
CATH CLOT TRIEVER BOLD (CATHETERS) IMPLANT
CATH NAVICROSS ANGLED 90CM (MICROCATHETER) IMPLANT
CATH VISIONS PV .035 IVUS (CATHETERS) IMPLANT
GLIDEWIRE ADV .035X260CM (WIRE) IMPLANT
GLIDEWIRE NITREX 0.018X80X5 (WIRE) ×6
GUIDEWIRE NITREX 0.018X80X5 (WIRE) IMPLANT
KIT ENCORE 26 ADVANTAGE (KITS) IMPLANT
KIT MICROPUNCTURE NIT STIFF (SHEATH) IMPLANT
KIT PV (KITS) ×3 IMPLANT
PROTECTION STATION PRESSURIZED (MISCELLANEOUS) ×3
SHEATH CLOT RETRIEVER (SHEATH) IMPLANT
SHEATH PINNACLE 8F 10CM (SHEATH) IMPLANT
SHEATH PROBE COVER 6X72 (BAG) IMPLANT
STATION PROTECTION PRESSURIZED (MISCELLANEOUS) IMPLANT
SYR MEDRAD MARK 7 150ML (SYRINGE) ×3 IMPLANT
TRANSDUCER W/STOPCOCK (MISCELLANEOUS) ×3 IMPLANT
TRAY PV CATH (CUSTOM PROCEDURE TRAY) ×3 IMPLANT
WIRE BENTSON .035X145CM (WIRE) IMPLANT
WIRE TORQFLEX AUST .018X40CM (WIRE) IMPLANT

## 2022-05-17 NOTE — Telephone Encounter (Signed)
Pt called with questions regarding medication prior to procedure this morning.  Reviewed with pt to NOT take Eliquis this AM. He asked if he could take a Xanax and with how anxious he seemed, it would be advantageous for him to take one. He refused to take 1/2 of his insulin because he was worried that his BG would drop too low. After several attempts to discuss, he kept saying he would not take it at all. Asked if pt was going to make it to the hospital by 10:00 and he said his cab was there now. Confirmed understanding.

## 2022-05-17 NOTE — Progress Notes (Addendum)
ANTICOAGULATION CONSULT NOTE - Initial Consult  Pharmacy Consult for heparin Indication: DVT  Allergies  Allergen Reactions   Gadolinium Derivatives Nausea And Vomiting    MRI dye    Patient Measurements: Height: '5\' 4"'$  (162.6 cm) Weight: 54.4 kg (120 lb) IBW/kg (Calculated) : 59.2 Heparin Dosing Weight: 54.4 kg  Vital Signs: Temp: 99.3 F (37.4 C) (03/11 1053) Temp Source: Oral (03/11 1053) BP: 123/89 (03/11 1539) Pulse Rate: 86 (03/11 1540)  Labs: Recent Labs    05/17/22 1124  HGB 12.6*  HCT 37.0*  CREATININE 0.70    Estimated Creatinine Clearance: 86.9 mL/min (by C-G formula based on SCr of 0.7 mg/dL).   Medical History: Past Medical History:  Diagnosis Date   Anxiety    Coronary artery disease    a. 05/2014 OOH MI/PCI: LM nl, LAD 40p, 99p (3.0x28 Synergy DES) w/ R->L Collaterals, LCX nl, RCA 30p/d, EF 55% w/ sev distal inf/ant HK.   Depression    Diabetes mellitus without complication ([place])    Erectile dysfunction    H/O echocardiogram    a. 05/2014 Echo: Ef 45-50%, sev mid antsept/ant HK.   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     Pancreatitis 10/2018   Tobacco abuse     Medications:  Scheduled:   sodium chloride flush  3 mL Intravenous Q12H    Assessment: 47 yo male recently started on Eliquis PTA for extensive L extremity DVT (last dose 3/10 PM). Patient [facility] for L lower extremity mechanical thrombectomy and balloon angioplasty on 05/17/22. Pharmacy consulted to dose heparin 8 hours after sheath removal.   Sheath removed ~1500, heparin drip will begin this evening at 2300.   Baseline heparin level <0.10, baseline aPTT 37. Will use heparin level for dosing  Goal of Therapy:  Heparin level 0.3-0.7 units/ml Monitor platelets by anticoagulation protocol: Yes   Plan:  No heparin bolus Start heparin drip at 850 units/hr Check heparin level  6 hours after drip started Monitor heparin level and CBC daily F/u ability to resume Eliquis  Dimple Nanas, PharmD, BCPS 05/17/2022 4:20 PM

## 2022-05-17 NOTE — Progress Notes (Signed)
Pt's HOB elevated to 30 degrees. Pt provided with food tray and diet soda per request. Pt denies any further needs or complaints.

## 2022-05-17 NOTE — Op Note (Signed)
    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 Donzetta Matters, MD Procedure Performed: 1.  Ultrasound-guided cannulation left popliteal vein 2.  Intravascular ultrasound left popliteal, left femoral, left common femoral, left external iliac, left common iliac vein and IVC 3.  Mechanical thrombectomy using Inari clot triever of left common iliac, left external iliac, left common femoral and left femoral veins 4.  Balloon angioplasty of the left common iliac and external iliac veins and left common femoral vein with 12 mm balloon 5.  Balloon angioplasty of left femoral vein with 8 and 10 mm balloons 6.  Left lower extremity venogram 7.  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  He is now indicated for venogram with possible invention.  Findings: There is extensive mostly chronic clot in the left femoral, left common femoral and extending up into the left external iliac and common iliac veins.  By intravascular ultrasound there was really no lumen in the femoral vein and the common femoral vein was severely decreased in the lumen and the external iliac and common iliac vein and subtotal occlusions.  Initial venogram demonstrated significant reflux into the left great saphenous vein.  After clot treated for we were able to establish a lumen after 7 passes and then we performed balloon angioplasty of the femoral vein all the way through the IVC and establish a very large lumen.  Plan will be to anticoagulate the patient and provide compression to get improvement in her edema.   Procedure:  The patient was identified in the holding area and taken to room 2.  The patient was then placed  prone on the table and prepped and draped in the usual sterile fashion.  A time out was called.  Ultrasound was used to evaluate the left small saphenous vein and this appeared to be occluded.  The popliteal vein was not compressible.  I anesthetized the area and cannulated the popliteal vein with micropuncture needle followed by wire and a sheath.  I then placed a Glidewire advantage followed by an 8 [place] sheath.  Patient was fully heparinized.  A Glidewire advantage and navi cross catheter was able to traverse all the way into the IVC and the SVC and confirmed intraluminal access.  We performed intravascular ultrasound from the popliteal vein to include the femoral vein and the common femoral vein as well as the external and common iliac veins all the way to the IVC with the above findings.  We then exchanged over a wire for the Inari sheath and then perform mechanical thrombectomy with 7 passes from the IVC all the way down to the femoral vein.  We performed repeat intravascular ultrasound and then balloon angioplasty in the leg with 8 and 10 mm balloons and centrally with 12 mm balloon.  Completion demonstrated much improved flow with minimal reflux into the great saphenous vein and a direct channel all the way to the IVC from the popliteal vein.  Satisfied with this we remove the sheaths and wires.  We suture-ligated the cannulation site with a bolster.  Patient tolerated procedure without any complication.  Contrast: 60cc  Markas Aldredge C. Donzetta Matters, MD [facility] Office: [phone] Pager: [phone]

## 2022-05-17 NOTE — Interval H&P Note (Signed)
History and Physical Interval Note:  05/17/2022 10:35 AM  [name] [name]  has presented today for surgery, with the diagnosis of left lower extremity DVT.  The various methods of treatment have been discussed with the patient and family. After consideration of risks, benefits and other options for treatment, the patient has consented to  Procedure(s): LOWER EXTREMITY VENOGRAPHY (Left) as a surgical intervention.  The patient's history has been reviewed, patient examined, no change in status, stable for surgery.  I have reviewed the patient's chart and labs.  Questions were answered to the patient's satisfaction.     Servando Snare

## 2022-05-17 NOTE — Plan of Care (Signed)
  Problem: Education: Goal: Knowledge of General Education information will improve Description: Including pain rating scale, medication(s)/side effects and non-pharmacologic comfort measures Outcome: Progressing   Problem: Health Behavior/Discharge Planning: Goal: Ability to manage health-related needs will improve Outcome: Progressing   Problem: Clinical Measurements: Goal: Respiratory complications will improve Outcome: Progressing   Problem: Clinical Measurements: Goal: Cardiovascular complication will be avoided Outcome: Progressing   Problem: Activity: Goal: Risk for activity intolerance will decrease Outcome: Progressing   Problem: Pain Managment: Goal: General experience of comfort will improve Outcome: Progressing   Problem: Safety: Goal: Ability to remain free from injury will improve Outcome: Progressing   Problem: Skin Integrity: Goal: Risk for impaired skin integrity will decrease Outcome: Progressing   Problem: Tissue Perfusion: Goal: Adequacy of tissue perfusion will improve Outcome: Progressing

## 2022-05-18 ENCOUNTER — Other Ambulatory Visit (HOSPITAL_COMMUNITY): Payer: Self-pay

## 2022-05-18 ENCOUNTER — Encounter (HOSPITAL_COMMUNITY): Payer: Self-pay | Admitting: Vascular Surgery

## 2022-05-18 DIAGNOSIS — I82492 Acute embolism and thrombosis of other specified deep vein of left lower extremity: Secondary | ICD-10-CM | POA: Diagnosis not present

## 2022-05-18 LAB — GLUCOSE, CAPILLARY
Glucose-Capillary: 194 mg/dL — ABNORMAL HIGH (ref 70–99)
Glucose-Capillary: 262 mg/dL — ABNORMAL HIGH (ref 70–99)
Glucose-Capillary: 198 mg/dL — ABNORMAL HIGH (ref 70–99)

## 2022-05-18 LAB — CBC
HCT: 35.6 % — ABNORMAL LOW (ref 39.0–52.0)
Hemoglobin: 12.7 g/dL — ABNORMAL LOW (ref 13.0–17.0)
MCH: 35.3 pg — ABNORMAL HIGH (ref 26.0–34.0)
MCHC: 35.7 g/dL (ref 30.0–36.0)
MCV: 98.9 fL (ref 80.0–100.0)
Platelets: 172 10*3/uL (ref 150–400)
RBC: 3.6 MIL/uL — ABNORMAL LOW (ref 4.22–5.81)
RDW: 14.8 % (ref 11.5–15.5)
WBC: 8.7 10*3/uL (ref 4.0–10.5)
nRBC: 0 % (ref 0.0–0.2)

## 2022-05-18 LAB — HEPARIN LEVEL (UNFRACTIONATED): Heparin Unfractionated: 0.13 IU/mL — ABNORMAL LOW (ref 0.30–0.70)

## 2022-05-18 MED ORDER — APIXABAN 5 MG PO TABS
10.0000 mg | ORAL_TABLET | Freq: Two times a day (BID) | ORAL | Status: DC
  Administered 2022-05-18: 10 mg via ORAL
  Filled 2022-05-18: qty 2

## 2022-05-18 MED ORDER — ORAL CARE MOUTH RINSE: 15.0000 mL | OROMUCOSAL | Status: DC | PRN

## 2022-05-18 MED ORDER — OXYCODONE HCL 5 MG PO TABS: 5.0000 mg | ORAL_TABLET | Freq: Four times a day (QID) | ORAL | 0 refills | Status: AC | PRN

## 2022-05-18 MED FILL — Midazolam HCl Inj 5 MG/5ML (Base Equivalent): INTRAMUSCULAR | Qty: 3 | Status: AC

## 2022-05-18 NOTE — Discharge Summary (Signed)
Discharge Summary  Patient ID: [name] [name] [id number] 46 y.o. 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  He is now indicated for venogram with possible invention.   Hospital Course:  Mr. Kudrick was admitted on 05/17/22 and underwent 1.  Intravascular ultrasound left popliteal, left femoral, left common femoral, left external iliac, left common iliac vein and IVC 2.  Mechanical thrombectomy using Inari clot triever of left common iliac, left external iliac, left common femoral and left femoral veins 3.  Balloon angioplasty of the left common iliac and external iliac veins and left common femoral vein with 12 mm balloon 4.  Balloon angioplasty of left femoral vein with 8 and 10 mm balloons 5. Left lower extremity venogram by Dr. Donzetta Matters. Successful mechanical thrombectomy of his left lower extremity. He tolerated the procedure well and was taken to the recovery room in stable condition.  Post operatively he was started on IV heparin.   POD#1 he was having some pain in left leg mostly in popliteal fossa in area of his venous access site. The bolster behind his knee was removed with some relief of discomfort. Some improvement in LLE swelling. ACE compression wraps reapplied. Continued on Heparin but transitioned to Eliquis for discharge. Thigh high 20-30 mmHg compression stocking ordered. Encouraged patient to try to ambulate and also continue to elevate his LLE. He otherwise remained stable for discharge home. PDMP was  reviewed and post op pain medication was sent to his pharmacy. He already had prescription for started pack of Eliquis. He will continue 10 mg twice daily and was provided instructions to start 5 mg twice daily on 05/20/22. He will have follow up in our office in 1 month with IVC/ Iliac vein duplex and LLE venous duplex.    Consults: None  Treatments: analgesia: oxycodone, anticoagulation: heparin, and surgery:  Intravascular ultrasound left popliteal, left femoral, left common femoral, left external iliac, left common iliac vein and IVC; Mechanical thrombectomy using Inari clot triever of left common iliac, left external iliac, left common femoral and left femoral veins; Balloon angioplasty of the left common iliac and external iliac veins and left common femoral vein with 12 mm balloon; Balloon angioplasty of left femoral vein with 8 and 10 mm balloons; Left lower extremity venogram   Disposition: Discharge disposition: 01-Home or Self Care       Patient Instructions:  Allergies as of 05/18/2022       Reactions   Gadolinium Derivatives Nausea And Vomiting   MRI dye        Medication List     TAKE these medications    acetaminophen 500 MG tablet Commonly known as: TYLENOL Take 1,000 mg by mouth every 6 (six) hours as needed for mild pain or moderate pain.   ALPRAZolam 0.5 MG tablet Commonly known as: XANAX TAKE 1 TABLET(0.5 MG) BY MOUTH DAILY AS NEEDED FOR ANXIETY   aspirin EC 81 MG tablet Take 1 tablet (81 mg total) by mouth daily.   atorvastatin 40 MG tablet Commonly known as: LIPITOR TAKE ONE TABLET BY MOUTH DAILY AT 6PM   Basaglar KwikPen 100 UNIT/ML  Inject 20 Units into the skin daily. What changed: how much to take   BLACK ELDERBERRY PO Take 1,000 mg by mouth daily.   doxycycline 100 MG tablet Commonly known as: ADOXA Take 200 mg by mouth daily as needed. Take 2 tablets in a single dose, as directed within 24 hours, but no later than 72 hours after  intercourse.   Eliquis DVT/PE Starter Pack Generic drug: Apixaban Starter Pack (10mg  and 5mg ) Take as directed on package: start with two-5mg  tablets twice daily for 7 days. On day 8, switch to one-5mg  tablet twice daily.   apixaban 5 MG Tabs tablet Commonly known as: ELIQUIS Take 1 tablet (5 mg total) by mouth 2 (two) times daily. Start taking after completion of starter pack.   emtricitabine-tenofovir 200-300 MG tablet Commonly known as: TRUVADA Take 1 tablet by mouth daily.   FreeStyle Libre 3 Sensor Misc 1 applicator by Does not apply route every 14 (fourteen) days.   Glucagon 3 MG/DOSE Powd Place 3 mg into the nose once as needed for up to 1 dose.   HumaLOG KwikPen 200 UNIT/ML KwikPen Generic drug: insulin lispro Inject 9-12 Units into the skin 3 (three) times daily before meals. What changed: how much to take   lisinopril 20 MG tablet Commonly known as: ZESTRIL Take 20 mg by mouth daily.   Lyumjev KwikPen 200 UNIT/ML KwikPen Generic drug: Insulin Lispro-aabc Inject 7-9 units 3x a day before meals   metoprolol tartrate 25 MG tablet Commonly known as: LOPRESSOR Take 1/2 tablet (12.5 mg total) by mouth 2 (two) times daily.   multivitamin with minerals Tabs tablet Take 1 tablet by mouth daily.   oxyCODONE 5 MG immediate release tablet Commonly known as: Oxy IR/ROXICODONE Take 1-2 tablets (5-10 mg total) by mouth every 6 (six) hours as needed for moderate pain.   Pen Needles 32G X 4 MM Misc Use as instructed to administer insulin 4X daily   ReliOn True Metrix Test Strips test strip Generic drug: glucose blood Use as instructed 2x a day   SALICYLIC ACID EX Apply 1 Application topically daily as needed (acne).       Activity: activity as tolerated and no driving for today, no driving while on analgesics, and no heavy lifting for 2 weeks Diet: regular diet Wound Care:  okay to shower as normal and wash behind left knee with mild soap and water. Can place dry  gauze dressing if needed  Follow-up with [facility] in 1 month  Signed: Karoline Caldwell, PA-C 05/18/2022 12:32 PM [facility] Office: [phone]

## 2022-05-18 NOTE — Progress Notes (Addendum)
  Progress Note    05/18/2022 8:14 AM 1 Day Post-Op  Subjective:  left behind knee uncomfortable- feels like something stabbing into him   Vitals:   05/17/22 2307 05/18/22 0438  BP: (!) 123/90 (!) 135/100  Pulse: 96 95  Resp: 19 19  Temp: 98 F (36.7 C) 98.1 F (36.7 C)  SpO2: 99% 98%   Physical Exam: Cardiac:  regular Lungs:  non labored Incisions:  left popliteal access site bolster removed. No swelling or hematoma. Clean dressings applied. ACE wraps applied to LLE Extremities:  BLE well perfused and warm with palpable DP pulses. LLE edematous Neurologic: alert and oriented  CBC    Component Value Date/Time   WBC 10.1 11/11/2021 1535   WBC 12.2 (H) 10/28/2021 1845   RBC 4.04 (L) 11/11/2021 1535   RBC 4.69 10/28/2021 1845   HGB 12.6 (L) 05/17/2022 1124   HGB 13.3 11/11/2021 1535   HCT 37.0 (L) 05/17/2022 1124   HCT 39.1 11/11/2021 1535   PLT 258 11/11/2021 1535   MCV 97 11/11/2021 1535   MCH 32.9 11/11/2021 1535   MCH 32.8 10/28/2021 1845   MCHC 34.0 11/11/2021 1535   MCHC 33.8 10/28/2021 1845   RDW 13.7 11/11/2021 1535   LYMPHSABS 2.7 11/11/2021 1535   MONOABS 1.6 (H) 10/28/2021 1845   EOSABS 0.1 11/11/2021 1535   BASOSABS 0.1 11/11/2021 1535    BMET    Component Value Date/Time   NA 139 05/17/2022 1124   NA 141 11/11/2021 1535   K 4.0 05/17/2022 1124   CL 101 05/17/2022 1124   CO2 29 05/13/2022 1125   GLUCOSE 122 (H) 05/17/2022 1124   BUN 5 (L) 05/17/2022 1124   BUN 6 11/11/2021 1535   CREATININE 0.70 05/17/2022 1124   CREATININE 0.61 11/01/2016 1348   CREATININE 0.65 11/01/2016 1348   CALCIUM 8.0 (L) 05/13/2022 1125   GFRNONAA >60 10/29/2021 0600   GFRAA 142 12/26/2019 1028    INR    Component Value Date/Time   INR 1.04 05/24/2014 0425     Intake/Output Summary (Last 24 hours) at 05/18/2022 9563 Last data filed at 05/18/2022 0600 Gross per 24 hour  Intake 538.74 ml  Output 700 ml  Net -161.26 ml     Assessment/Plan:  47 y.o. male  is s/p Mechanical thrombectomy of left popliteal, left femoral, left common femoral, left external iliac, left common iliac and IVC, balloon angioplasty of left common iliac, external iliac and common femoral vein 1 Day Post-Op   Successful mechanical thrombectomy of LLE LLE swelling improving Transition to Eliquis  Thigh high compression stocking ordered Continue ACE wraps to LLE until thigh high compression stocking delivered then transition to compression stocking Discussed importance of elevation and ambulation with patient [place] behind left knee removed Stable for discharge home today once ambulating without difficulty He will have follow up in 1 month with Aorto/ iliac duplex and LLE venous duplex  Karoline Caldwell, PA-C [facility] [phone] 05/18/2022 8:14 AM  I have independently interviewed and examined patient and agree with PA assessment and plan above.  Plan will be for anticoagulation as tolerates and thigh-high compression stockings for now.  Possibly can transition to antiplatelet and stockings after 6 months if his symptoms are better.  He is okay for activity as tolerates.  Hamdi Kley C. Donzetta Matters, MD [facility] [place] Office: [phone] Pager: [phone]

## 2022-05-18 NOTE — Progress Notes (Signed)
ANTICOAGULATION CONSULT NOTE   Pharmacy Consult for heparin> apixaban Indication: DVT  Allergies  Allergen Reactions   Gadolinium Derivatives Nausea And Vomiting    MRI dye    Patient Measurements: Height: '5\' 4"'$  (162.6 cm) Weight: 54.4 kg (120 lb) IBW/kg (Calculated) : 59.2 Heparin Dosing Weight: 54.4 kg  Vital Signs: Temp: 98.6 F (37 C) (03/12 0851) Temp Source: Oral (03/12 0851) BP: 129/94 (03/12 0851) Pulse Rate: 105 (03/12 0851)  Labs: Recent Labs    05/17/22 1124 05/17/22 1940 05/17/22 1948 05/18/22 0727  HGB 12.6*  --   --  12.7*  HCT 37.0*  --   --  35.6*  PLT  --   --   --  172  APTT  --  37*  --   --   HEPARINUNFRC  --   --  <0.10* 0.13*  CREATININE 0.70  --   --   --      Estimated Creatinine Clearance: 86.9 mL/min (by C-G formula based on SCr of 0.7 mg/dL).   Medical History: Past Medical History:  Diagnosis Date   Anxiety    Coronary artery disease    a. 05/2014 OOH MI/PCI: LM nl, LAD 40p, 99p (3.0x28 Synergy DES) w/ R->L Collaterals, LCX nl, RCA 30p/d, EF 55% w/ sev distal inf/ant HK.   Depression    Diabetes mellitus without complication ([place])    Erectile dysfunction    H/O echocardiogram    a. 05/2014 Echo: Ef 45-50%, sev mid antsept/ant HK.   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     Pancreatitis 10/2018   Tobacco abuse     Medications:  Scheduled:   apixaban  10 mg Oral BID   insulin aspart  0-15 Units Subcutaneous TID WC   insulin aspart  0-5 Units Subcutaneous QHS   sodium chloride flush  3 mL Intravenous Q12H    Assessment: 47 yo male recently started on Eliquis PTA for extensive L extremity DVT (last dose 3/10 PM). He is s/p  L lower extremity mechanical thrombectomy and balloon angioplasty on 05/17/22. Pharmacy consulted to restart apixaban -He is on day 6 of the DVT dosing pack  Goal of Therapy:  Monitor platelets by  anticoagulation protocol: Yes   Plan:  -resume apixaban '10mg'$  po bid -he will start '5mg'$  po bid on 05/19/20 (I discusses with him)  Hildred Laser, PharmD Clinical Pharmacist **Pharmacist phone directory can now be found on [email or web address] ([email or web address]).  Listed under [place].

## 2022-05-18 NOTE — Discharge Instructions (Signed)
Discharge Instructions for DVT (Deep Vein Thrombosis):  Call 911 or visit the nearest emergency room if you experience any of the following:  Sudden chest tightness or pain. Sharp pain when taking a deep breath. Cough with bloody mucus. Sudden shortness of breath or fast breathing A fast heart rate. Cold skin, clammy skin, or sweating. New swelling in the arms or legs.  Continue to take your anticoagulation medication daily as prescribed Elevate your affected extremity daily above the level of the heart Wear your thigh high compression stocking daily You will have follow up with your Vascular provider in 1 month with an ultrasound

## 2022-05-18 NOTE — Progress Notes (Signed)
CSW received consult from RN. Patient in need of transportation assistance at dc. Patient requested a bus pass. CSW provided patient with bus pass. Patient accepted. No further questions reported at this time.

## 2022-05-19 LAB — HEMOGLOBIN A1C
Hgb A1c MFr Bld: 7.1 % — ABNORMAL HIGH (ref 4.8–5.6)
Mean Plasma Glucose: 157 mg/dL

## 2022-05-21 ENCOUNTER — Telehealth: Payer: Self-pay | Admitting: Physician Assistant

## 2022-05-21 ENCOUNTER — Ambulatory Visit: Payer: Commercial Managed Care - HMO | Admitting: Internal Medicine

## 2022-05-21 LAB — GLUTAMIC ACID DECARBOXYLASE AUTO ABS: Glutamic Acid Decarb Ab: <5 IU/mL (ref <5)

## 2022-05-21 LAB — IA-2 ANTIBODY: IA-2 Antibody: <5.4 U/mL (ref <5.4)

## 2022-05-21 LAB — GLUCOSE, FASTING: Glucose, Bld: 80 mg/dL (ref 65–99)

## 2022-05-21 LAB — ZNT8 ANTIBODIES: ZNT8 Antibodies: <10 U/mL (ref <15)

## 2022-05-21 LAB — C-PEPTIDE: C-Peptide: 0.2 ng/mL — ABNORMAL LOW (ref 0.80–3.85)

## 2022-05-21 NOTE — Telephone Encounter (Signed)
-----   Message from Karoline Caldwell, [place] sent at 05/18/2022  7:10 AM EDT ----- S/p mechanical thrombectomy of LLE by Dr. Donzetta Matters. He needs follow up in 1 month with IVC/ Iliac duplex and LLE venous duplex. Thanks

## 2022-05-28 ENCOUNTER — Other Ambulatory Visit: Payer: Commercial Managed Care - HMO

## 2022-05-29 ENCOUNTER — Encounter: Payer: Self-pay | Admitting: Internal Medicine

## 2022-05-29 ENCOUNTER — Encounter: Payer: Self-pay | Admitting: Neurology

## 2022-06-01 ENCOUNTER — Other Ambulatory Visit (INDEPENDENT_AMBULATORY_CARE_PROVIDER_SITE_OTHER): Payer: Commercial Managed Care - HMO

## 2022-06-01 ENCOUNTER — Telehealth: Payer: Self-pay | Admitting: Family Medicine

## 2022-06-01 DIAGNOSIS — R634 Abnormal weight loss: Secondary | ICD-10-CM | POA: Diagnosis not present

## 2022-06-01 DIAGNOSIS — K903 Pancreatic steatorrhea: Secondary | ICD-10-CM | POA: Diagnosis not present

## 2022-06-01 LAB — VITAMIN D 25 HYDROXY (VIT D DEFICIENCY, FRACTURES): VITD: 26.98 ng/mL — ABNORMAL LOW (ref 30.00–100.00)

## 2022-06-01 LAB — B12 AND FOLATE PANEL
Folate: 3.5 ng/mL — ABNORMAL LOW (ref >5.9)
Vitamin B-12: 715 pg/mL (ref 211–911)

## 2022-06-01 LAB — TSH: TSH: 3.88 u[IU]/mL (ref 0.35–5.50)

## 2022-06-01 NOTE — Telephone Encounter (Signed)
Spoke with the patient and informed him an IT ticket was entered to have the diagnosis removed as requested below.

## 2022-06-01 NOTE — Telephone Encounter (Signed)
Pt states his dx of HIV is incorrect in his chart and wanted to know if it could be removed. He said he has never been dx with HIV and is not sure how it got attached to his chart.

## 2022-06-04 ENCOUNTER — Encounter: Payer: Self-pay | Admitting: Oncology

## 2022-06-04 ENCOUNTER — Inpatient Hospital Stay: Payer: Commercial Managed Care - HMO

## 2022-06-04 ENCOUNTER — Inpatient Hospital Stay: Payer: Commercial Managed Care - HMO | Attending: Oncology | Admitting: Oncology

## 2022-06-04 VITALS — BP 105/74 | HR 96 | Temp 98.2°F | Resp 18 | Ht 64.0 in | Wt 125.6 lb

## 2022-06-04 DIAGNOSIS — I82422 Acute embolism and thrombosis of left iliac vein: Secondary | ICD-10-CM

## 2022-06-04 DIAGNOSIS — Z7901 Long term (current) use of anticoagulants: Secondary | ICD-10-CM | POA: Diagnosis not present

## 2022-06-04 DIAGNOSIS — D6859 Other primary thrombophilia: Secondary | ICD-10-CM

## 2022-06-04 DIAGNOSIS — I82512 Chronic embolism and thrombosis of left femoral vein: Secondary | ICD-10-CM

## 2022-06-04 DIAGNOSIS — F17290 Nicotine dependence, other tobacco product, uncomplicated: Secondary | ICD-10-CM | POA: Diagnosis not present

## 2022-06-04 DIAGNOSIS — B20 Human immunodeficiency virus [HIV] disease: Secondary | ICD-10-CM | POA: Diagnosis not present

## 2022-06-04 DIAGNOSIS — Z5181 Encounter for therapeutic drug level monitoring: Secondary | ICD-10-CM

## 2022-06-04 DIAGNOSIS — I871 Compression of vein: Secondary | ICD-10-CM

## 2022-06-04 LAB — APTT: aPTT: 34 seconds (ref 24–36)

## 2022-06-04 LAB — CBC WITH DIFFERENTIAL (CANCER CENTER ONLY)
Abs Immature Granulocytes: 0.04 10*3/uL (ref 0.00–0.07)
Basophils Absolute: 0 10*3/uL (ref 0.0–0.1)
Basophils Relative: 0 %
Eosinophils Absolute: 0 10*3/uL (ref 0.0–0.5)
Eosinophils Relative: 0 %
HCT: 29.3 % — ABNORMAL LOW (ref 39.0–52.0)
Hemoglobin: 10.4 g/dL — ABNORMAL LOW (ref 13.0–17.0)
Immature Granulocytes: 1 %
Lymphocytes Relative: 21 %
Lymphs Abs: 1.8 10*3/uL (ref 0.7–4.0)
MCH: 36 pg — ABNORMAL HIGH (ref 26.0–34.0)
MCHC: 35.5 g/dL (ref 30.0–36.0)
MCV: 101.4 fL — ABNORMAL HIGH (ref 80.0–100.0)
Monocytes Absolute: 0.7 10*3/uL (ref 0.1–1.0)
Monocytes Relative: 8 %
Neutro Abs: 6.1 10*3/uL (ref 1.7–7.7)
Neutrophils Relative %: 70 %
Platelet Count: 146 10*3/uL — ABNORMAL LOW (ref 150–400)
RBC: 2.89 MIL/uL — ABNORMAL LOW (ref 4.22–5.81)
RDW: 12.9 % (ref 11.5–15.5)
WBC Count: 8.6 10*3/uL (ref 4.0–10.5)
nRBC: 0 % (ref 0.0–0.2)

## 2022-06-04 LAB — CMP (CANCER CENTER ONLY)
ALT: 39 U/L (ref 0–44)
AST: 94 U/L — ABNORMAL HIGH (ref 15–41)
Albumin: 2.1 g/dL — ABNORMAL LOW (ref 3.5–5.0)
Alkaline Phosphatase: 354 U/L — ABNORMAL HIGH (ref 38–126)
Anion gap: 3 — ABNORMAL LOW (ref 5–15)
BUN: <5 mg/dL — ABNORMAL LOW (ref 6–20)
CO2: 26 mmol/L (ref 22–32)
Calcium: 7.2 mg/dL — ABNORMAL LOW (ref 8.9–10.3)
Chloride: 109 mmol/L (ref 98–111)
Creatinine: 0.69 mg/dL (ref 0.61–1.24)
GFR, Estimated: >60 mL/min (ref >60)
Glucose, Bld: 129 mg/dL — ABNORMAL HIGH (ref 70–99)
Potassium: 3.4 mmol/L — ABNORMAL LOW (ref 3.5–5.1)
Sodium: 138 mmol/L (ref 135–145)
Total Bilirubin: 0.6 mg/dL (ref 0.3–1.2)
Total Protein: 4.9 g/dL — ABNORMAL LOW (ref 6.5–8.1)

## 2022-06-04 LAB — PROTIME-INR
INR: 1.2 (ref 0.8–1.2)
Prothrombin Time: 15.1 seconds (ref 11.4–15.2)

## 2022-06-04 LAB — FIBRINOGEN: Fibrinogen: 216 mg/dL (ref 210–475)

## 2022-06-04 NOTE — Progress Notes (Signed)
[facility] Initial Visit:  Patient Care Team: Farrel Conners, MD as PCP - General (Family Medicine) Elouise Munroe, MD as PCP - Cardiology (Cardiology)  CHIEF COMPLAINTS/PURPOSE OF CONSULTATION:  HISTORY OF PRESENTING ILLNESS: [name] [name] 47 y.o. male is here because of  VTE Medical history notable for coronary artery disease, diabetes mellitus type 2, hyperlipidemia, hypertension, neurosyphilis, tobacco use, pancreatitis, HIV   October 28, 2021: CT angio chest--negative for acute PE.  Mild heterogeneous bilateral groundglass densities suggestive of respiratory infection/pneumonia  April 15, 2022 contacted heart care with concerns of pain and swelling of left lower extremity for 2 weeks duration  May 10, 2022: Seen at [facility] heart and vascular clinic.  Ultrasound there demonstrated age-indeterminate left lower extremity DVT extending as proximal as the iliac vein.  No evidence of DVT in the right lower extremity.  Patient was begun on Eliquis  May 12, 2022 left lower extremity ultrasound showed superficial edema in the calf.  Popliteal fossa with collapsed Baker's cyst.  Patency in the IVC and liver and mid abdomen and left common iliac vein proximally.  Limited visualization of vein from mid common iliac vein to external iliac vein due to overlying bowel gas.  Left distal external iliac vein shows nonocclusive DVT.  During that visit concern was raised for possible of May Thurner syndrome  May 17, 2022: Underwent mechanical thrombectomy of left common iliac, left external iliac left common femoral and left femoral veins.  Balloon angioplasty of left common iliac and external iliac veins and left common femoral vein.  Balloon angioplasty of left femoral vein.  Left lower extremity venogram.  Patient was ultimately discharged home on Eliquis  June 04 2022:  [facility] Hematology Consult Patient has no prior history of VTE.    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  Smokes 2 mini cigars daily.  EtOH 3 to 5 drinks about twice a week  [facility] Mother alive 41 orthopedic issues, hypothyroidism Father died 14 peripheral vascular disease, cerebrovascular disease. DM Type II Brother alive 65 obese Brother alive 61 well Sister alive 44 breast lump benign Sister alive 37 gall stones   Review of Systems  Constitutional:  Negative for appetite change, chills, fatigue, fever and unexpected weight change.  HENT:   Negative for lump/mass, mouth sores, nosebleeds, sore throat, trouble swallowing and voice change.   Eyes:  Negative for eye problems and icterus.       Vision changes:  None  Respiratory:  Negative for chest tightness, cough, hemoptysis, shortness of breath and wheezing.        PND:  none Orthopnea:  none DOE:    Cardiovascular:  Negative for chest pain and palpitations.       Has LLE improved since proceedure  Gastrointestinal:  Negative for abdominal pain, blood in stool, constipation, diarrhea, nausea and vomiting.  Endocrine: Negative for hot flashes.       Cold intolerance:  none Heat intolerance:  none  Genitourinary:  Negative for bladder incontinence, difficulty urinating, dysuria, frequency, hematuria and nocturia.   Musculoskeletal:  Negative for arthralgias, back pain, myalgias, neck pain and neck stiffness.  Skin:  Negative for itching, rash and wound.  Neurological:  Negative for extremity weakness, headaches, light-headedness, numbness, seizures and speech difficulty.  Hematological:  Negative for adenopathy. Bruises/bleeds easily.  Psychiatric/Behavioral:  Negative for sleep disturbance and suicidal ideas. The patient is not nervous/anxious.     MEDICAL HISTORY: Past Medical History:  Diagnosis  Date   Anxiety    Coronary artery disease    a. 05/2014 OOH MI/PCI: LM nl, LAD 40p, 99p (3.0x28 Synergy DES) w/ R->L Collaterals, LCX nl, RCA 30p/d, EF 55% w/ sev distal inf/ant HK.   Depression     Diabetes mellitus without complication (Bud)    Erectile dysfunction    H/O echocardiogram    a. 05/2014 Echo: Ef 45-50%, sev mid antsept/ant HK.   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     Pancreatitis 10/2018   Tobacco abuse     SURGICAL HISTORY: Past Surgical History:  Procedure Laterality Date   CORONARY STENT PLACEMENT  05/24/2014   MID LAD DES    INTRAVASCULAR ULTRASOUND/IVUS N/A 05/17/2022   Procedure: Intravascular Ultrasound/IVUS;  Surgeon: Waynetta Sandy, MD;  Location: [facility];  Service: Cardiovascular;  Laterality: N/A;   LEFT HEART CATHETERIZATION WITH CORONARY ANGIOGRAM N/A 05/24/2014   Procedure: LEFT HEART CATHETERIZATION WITH CORONARY ANGIOGRAM;  Surgeon: Jettie Booze, MD;  Location: [facility] CATH LAB;  Service: Cardiovascular;  Laterality: N/A;   LOWER EXTREMITY VENOGRAPHY Left 05/17/2022   Procedure: LOWER EXTREMITY VENOGRAPHY;  Surgeon: Waynetta Sandy, MD;  Location: [facility];  Service: Cardiovascular;  Laterality: Left;   PERCUTANEOUS CORONARY STENT INTERVENTION (PCI-S)  05/24/2014   Procedure: PERCUTANEOUS CORONARY STENT INTERVENTION (PCI-S);  Surgeon: Jettie Booze, MD;  Location: [facility] CATH LAB;  Service: Cardiovascular;;   PERIPHERAL VASCULAR BALLOON ANGIOPLASTY  05/17/2022   Procedure: PERIPHERAL VASCULAR BALLOON ANGIOPLASTY;  Surgeon: Waynetta Sandy, MD;  Location: [facility];  Service: Cardiovascular;;   PERIPHERAL VASCULAR THROMBECTOMY  05/17/2022   Procedure: PERIPHERAL VASCULAR THROMBECTOMY;  Surgeon: Waynetta Sandy, MD;  Location: [place] [facility];  Service: Cardiovascular;;   PILONIDAL CYST EXCISION  1995    SOCIAL HISTORY: Social History   Socioeconomic History   Marital status: Single    Spouse name: Not on file   Number of children: Not on file   Years of education: Not on  file   Highest education level: Not on file  Occupational History   Occupation: Secondary school teacher  Tobacco Use   Smoking status: Former    Packs/day: 1.00    Years: 23.00    Additional pack years: 0.00    Total pack years: 23.00    Types: Cigarettes    Quit date: 2020    Years since quitting: 4.2   Smokeless tobacco: Never   Tobacco comments:    currently smoking 0.5 ppd, declines patch  Vaping Use   Vaping Use: Never used  Substance and Sexual Activity   Alcohol use: Yes    Alcohol/week: 24.0 standard drinks of alcohol    Types: 24 Cans of beer per week    Comment: 1/5-3/4 Fifth, 4-5 times a week.  +Guilty, cutting down; 2/4 CAGE questions.   Drug use: No   Sexual activity: Yes    Partners: Male  Other Topics Concern   Not on file  Social History Narrative   Lives at home with roommate, exercise at gym in apartment complex, diet - no discretion.       Social Determinants of Health   Financial Resource Strain: High Risk (05/13/2022)   Overall Financial Resource Strain (CARDIA)    Difficulty of Paying Living Expenses: Hard  Food Insecurity: No Food Insecurity (05/18/2022)   Hunger Vital  Sign    Worried About Charity fundraiser in the Last Year: Never true    [place] in the Last Year: Never true  Transportation Needs: No Transportation Needs (05/18/2022)   PRAPARE - Hydrologist (Medical): No    Lack of Transportation (Non-Medical): No  Recent Concern: Transportation Needs - Unmet Transportation Needs (05/13/2022)   PRAPARE - Hydrologist (Medical): Yes    Lack of Transportation (Non-Medical): Yes  Physical Activity: Not on file  Stress: Not on file  Social Connections: Not on file  Intimate Partner Violence: Not At Risk (05/18/2022)   Humiliation, Afraid, Rape, and Kick questionnaire    Fear of Current or Ex-Partner: No    Emotionally Abused: No    Physically Abused: No    Sexually Abused: No     FAMILY HISTORY Family History  Problem Relation Age of Onset   Thyroid disease Mother    Stroke Father    Heart disease Father 8       MI, stents   Peripheral Artery Disease Father    Diabetes type II Father    Diabetes Father    Colon cancer Father    Diabetes Maternal Grandmother    Hypertension Maternal Grandfather    Hypertension Paternal Grandfather    Cancer Neg Hx    Other Neg Hx        hypogonadism   Pancreatic cancer Neg Hx    Esophageal cancer Neg Hx     ALLERGIES:  is allergic to gadolinium derivatives.  MEDICATIONS:  Current Outpatient Medications  Medication Sig Dispense Refill   acetaminophen (TYLENOL) 500 MG tablet Take 1,000 mg by mouth every 6 (six) hours as needed for mild pain or moderate pain.     ALPRAZolam (XANAX) 0.5 MG tablet TAKE 1 TABLET(0.5 MG) BY MOUTH DAILY AS NEEDED FOR ANXIETY 30 tablet 0   apixaban (ELIQUIS) 5 MG TABS tablet Take 1 tablet (5 mg total) by mouth 2 (two) times daily. Start taking after completion of starter pack. 60 tablet 5   APIXABAN (ELIQUIS) VTE STARTER PACK (10MG  AND 5MG ) Take as directed on package: start with two-5mg  tablets twice daily for 7 days. On day 8, switch to one-5mg  tablet twice daily. 74 each 0   aspirin EC 81 MG EC tablet Take 1 tablet (81 mg total) by mouth daily.     atorvastatin (LIPITOR) 40 MG tablet TAKE ONE TABLET BY MOUTH DAILY AT 6PM 90 tablet 3   BLACK ELDERBERRY PO Take 1,000 mg by mouth daily.     Continuous Blood Gluc Sensor (FREESTYLE LIBRE 3 SENSOR) MISC 1 applicator by Does not apply route every 14 (fourteen) days. 6 each 3   doxycycline (ADOXA) 100 MG tablet Take 200 mg by mouth daily as needed. Take 2 tablets in a single dose, as directed within 24 hours, but no later than 72 hours after intercourse.     emtricitabine-tenofovir (TRUVADA) 200-300 MG tablet Take 1 tablet by mouth daily.     Glucagon 3 MG/DOSE POWD Place 3 mg into the nose once as needed for up to 1 dose. 1 each 11   glucose  blood (RELION TRUE METRIX TEST STRIPS) test strip Use as instructed 2x a day 100 each 3   Insulin Glargine (BASAGLAR KWIKPEN) 100 UNIT/ML Inject 20 Units into the skin daily. (Patient taking differently: Inject 12-18 Units into the skin daily.) 30 mL 3   insulin lispro (  HUMALOG KWIKPEN) 200 UNIT/ML KwikPen Inject 9-12 Units into the skin 3 (three) times daily before meals. (Patient taking differently: Inject 4-7 Units into the skin 3 (three) times daily before meals.) 45 mL 0   Insulin Lispro-aabc (LYUMJEV KWIKPEN) 200 UNIT/ML KwikPen Inject 7-9 units 3x a day before meals 30 mL 3   Insulin Pen Needle (PEN NEEDLES) 32G X 4 MM MISC Use as instructed to administer insulin 4X daily 400 each 0   lisinopril (ZESTRIL) 20 MG tablet Take 20 mg by mouth daily.     metoprolol tartrate (LOPRESSOR) 25 MG tablet Take 1/2 tablet (12.5 mg total) by mouth 2 (two) times daily. 90 tablet 3   Multiple Vitamin (MULTIVITAMIN WITH MINERALS) TABS tablet Take 1 tablet by mouth daily.     oxyCODONE (OXY IR/ROXICODONE) 5 MG immediate release tablet Take 1-2 tablets (5-10 mg total) by mouth every 6 (six) hours as needed for moderate pain. 16 tablet 0   Salicylic Acid, Acne, (SALICYLIC ACID EX) Apply 1 Application topically daily as needed (acne).     No current facility-administered medications for this visit.    PHYSICAL EXAMINATION:  ECOG PERFORMANCE STATUS: 1 - Symptomatic but completely ambulatory   There were no vitals filed for this visit.  There were no vitals filed for this visit.   Physical Exam Vitals and nursing note reviewed.  Constitutional:      Appearance: Normal appearance. He is normal weight. He is not diaphoretic.  HENT:     Head: Normocephalic and atraumatic.     Right Ear: External ear normal.     Left Ear: External ear normal.     Nose: Nose normal.  Eyes:     General: No scleral icterus.    Conjunctiva/sclera: Conjunctivae normal.     Pupils: Pupils are equal, round, and reactive to  light.  Cardiovascular:     Rate and Rhythm: Normal rate and regular rhythm.     Heart sounds: Normal heart sounds.     No friction rub. No gallop.  Pulmonary:     Effort: Pulmonary effort is normal. No respiratory distress.     Breath sounds: Normal breath sounds. No stridor. No wheezing or rhonchi.  Abdominal:     General: Abdomen is flat.     Palpations: Abdomen is soft.     Tenderness: There is no abdominal tenderness. There is no guarding or rebound.  Musculoskeletal:        General: No deformity. Normal range of motion.     Cervical back: Normal range of motion and neck supple. No rigidity or tenderness.     Right lower leg: No edema.     Left lower leg: No edema.  Lymphadenopathy:     Head:     Right side of head: No submental, submandibular, tonsillar, preauricular, posterior auricular or occipital adenopathy.     Left side of head: No submental, submandibular, tonsillar, preauricular, posterior auricular or occipital adenopathy.     Cervical: No cervical adenopathy.     Right cervical: No superficial, deep or posterior cervical adenopathy.    Left cervical: No superficial, deep or posterior cervical adenopathy.     Upper Body:     Right upper body: No supraclavicular or axillary adenopathy.     Left upper body: No supraclavicular or axillary adenopathy.     Lower Body: No right inguinal adenopathy. No left inguinal adenopathy.  Skin:    Coloration: Skin is not jaundiced.  Neurological:     General: No  focal deficit present.     Mental Status: He is alert and oriented to person, place, and time.     Cranial Nerves: No cranial nerve deficit.  Psychiatric:        Mood and Affect: Mood normal.        Behavior: Behavior normal.        Thought Content: Thought content normal.        Judgment: Judgment normal.      LABORATORY DATA: I have personally reviewed the data as listed:  Appointment on 06/01/2022  Component Date Value Ref Range Status   TSH 06/01/2022 3.88   0.35 - 5.50 uIU/mL Final   Vitamin B-12 06/01/2022 715  211 - 911 pg/mL Final   Folate 06/01/2022 3.5 (L)  >5.9 ng/mL Final   VITD 06/01/2022 26.98 (L)  30.00 - 100.00 ng/mL Final  Admission on 05/17/2022, Discharged on 05/18/2022  Component Date Value Ref Range Status   Sodium 05/17/2022 139  135 - 145 mmol/L Final   Potassium 05/17/2022 4.0  3.5 - 5.1 mmol/L Final   Chloride 05/17/2022 101  98 - 111 mmol/L Final   BUN 05/17/2022 5 (L)  6 - 20 mg/dL Final   Creatinine, Ser 05/17/2022 0.70  0.61 - 1.24 mg/dL Final   Glucose, Bld 44/03/270 122 (H)  70 - 99 mg/dL Final   Glucose reference range applies only to samples taken after fasting for at least 8 hours.   Calcium, Ion 05/17/2022 0.97 (L)  1.15 - 1.40 mmol/L Final   TCO2 05/17/2022 28  22 - 32 mmol/L Final   Hemoglobin 05/17/2022 12.6 (L)  13.0 - 17.0 g/dL Final   HCT 53/66/4403 37.0 (L)  39.0 - 52.0 % Final   Heparin Unfractionated 05/17/2022 <0.10 (L)  0.30 - 0.70 IU/mL Final   Comment: (NOTE) The clinical reportable range upper limit is being lowered to >1.10 to align with the FDA approved guidance for the current laboratory assay.  If heparin results are below expected values, and patient dosage has  been confirmed, suggest follow up testing of antithrombin III levels. Performed at [facility], [address] [place], [place] [address]    aPTT 05/17/2022 37 (H)  24 - 36 seconds Final   Comment:        IF BASELINE aPTT IS ELEVATED, SUGGEST PATIENT RISK ASSESSMENT BE USED TO DETERMINE APPROPRIATE ANTICOAGULANT THERAPY. Performed at [facility], [address], [place], [place] [address]    Glucose-Capillary 05/17/2022 93  70 - 99 mg/dL Final   Glucose reference range applies only to samples taken after fasting for at least 8 hours.   Glucose-Capillary 05/17/2022 242 (H)  70 - 99 mg/dL Final   Glucose reference range applies only to samples taken after fasting for at least 8 hours.   WBC 05/18/2022 8.7  4.0  - 10.5 K/uL Final   RBC 05/18/2022 3.60 (L)  4.22 - 5.81 MIL/uL Final   Hemoglobin 05/18/2022 12.7 (L)  13.0 - 17.0 g/dL Final   HCT 75/64/3329 35.6 (L)  39.0 - 52.0 % Final   MCV 05/18/2022 98.9  80.0 - 100.0 fL Final   MCH 05/18/2022 35.3 (H)  26.0 - 34.0 pg Final   MCHC 05/18/2022 35.7  30.0 - 36.0 g/dL Final   RDW 51/88/4166 14.8  11.5 - 15.5 % Final   Platelets 05/18/2022 172  150 - 400 K/uL Final   nRBC 05/18/2022 0.0  0.0 - 0.2 % Final   Performed  at [facility], [address], [place], [place] [address]   Hgb A1c MFr Bld 05/18/2022 7.1 (H)  4.8 - 5.6 % Final   Comment: (NOTE)         Prediabetes: 5.7 - 6.4         Diabetes: >6.4         Glycemic control for adults with diabetes: <7.0    Mean Plasma Glucose 05/18/2022 157  mg/dL Final   Comment: (NOTE) Performed At: [facility] [address] [place], [place] [id number] Jolene Schimke MD OZ:[id number]    Heparin Unfractionated 05/18/2022 0.13 (L)  0.30 - 0.70 IU/mL Final   Comment: (NOTE) The clinical reportable range upper limit is being lowered to >1.10 to align with the FDA approved guidance for the current laboratory assay.  If heparin results are below expected values, and patient dosage has  been confirmed, suggest follow up testing of antithrombin III levels. Performed at [facility], [address], [place], [place] [address]    Glucose-Capillary 05/18/2022 194 (H)  70 - 99 mg/dL Final   Glucose reference range applies only to samples taken after fasting for at least 8 hours.   Glucose-Capillary 05/18/2022 262 (H)  70 - 99 mg/dL Final   Glucose reference range applies only to samples taken after fasting for at least 8 hours.   Glucose-Capillary 05/18/2022 198 (H)  70 - 99 mg/dL Final   Glucose reference range applies only to samples taken after fasting for at least 8 hours.  Office Visit on 05/13/2022  Component Date Value Ref Range Status   Sodium 05/13/2022 136  135 - 145 mEq/L Final    Potassium 05/13/2022 3.7  3.5 - 5.1 mEq/L Final   Chloride 05/13/2022 101  96 - 112 mEq/L Final   CO2 05/13/2022 29  19 - 32 mEq/L Final   Glucose, Bld 05/13/2022 80  70 - 99 mg/dL Final   BUN 84/13/2440 4 (L)  6 - 23 mg/dL Final   Creatinine, Ser 05/13/2022 0.66  0.40 - 1.50 mg/dL Final   Total Bilirubin 05/13/2022 0.6  0.2 - 1.2 mg/dL Final   Alkaline Phosphatase 05/13/2022 211 (H)  39 - 117 U/L Final   AST 05/13/2022 36  0 - 37 U/L Final   ALT 05/13/2022 25  0 - 53 U/L Final   Total Protein 05/13/2022 5.3 (L)  6.0 - 8.3 g/dL Final   Albumin 01/02/2535 2.3 (L)  3.5 - 5.2 g/dL Final   GFR 64/40/3474 110.83  >60.00 mL/min Final   Calculated using the CKD-EPI Creatinine Equation (2021)   Calcium 05/13/2022 8.0 (L)  8.4 - 10.5 mg/dL Final   C-Peptide 25/95/6387 0.20 (L)  0.80 - 3.85 ng/mL Final   Glucose, Bld 05/13/2022 80  65 - 99 mg/dL Final   Comment: .            Fasting reference interval .    IA-2 Antibody 05/13/2022 <5.4  <5.4 U/mL Final   Comment: . This test was performed using the IA-2 Antibody ELISA method which is standardized against the [employer] Reference Reagent 97/550. The reference range reported was established specifically for this test method.    ZNT8 Antibodies 05/13/2022 <10  <15 U/mL Final   Comment: . For additional information, please refer to http://education.[facility].com/faq/FAQ168 (This link is being provided for information/educational purposes only.) .    Glutamic Acid Decarb Ab 05/13/2022 <5  <5 IU/mL Final   Comment: . This test was performed using  the GAD65 ELISA method, which is standardized against the International reference preparation 97/550. Marland Kitchen    Cholesterol 05/13/2022 42  0 - 200 mg/dL Final   ATP III Classification       Desirable:  < 200 mg/dL               Borderline High:  200 - 239 mg/dL          High:  > = 161 mg/dL   Triglycerides 09/60/4540 55.0  0.0 - 149.0 mg/dL Final   Normal:  <981 mg/dLBorderline High:  150 - 199  mg/dL   HDL 19/14/7829 56.21 (L)  >30.86 mg/dL Final   VLDL 57/84/6962 11.0  0.0 - 40.0 mg/dL Final   LDL Cholesterol 05/13/2022 5  0 - 99 mg/dL Final   Total CHOL/HDL Ratio 05/13/2022 2   Final                  Men          Women1/2 Average Risk     3.4          3.3Average Risk          5.0          4.42X Average Risk          9.6          7.13X Average Risk          15.0          11.0                       NonHDL 05/13/2022 16.05   Final   NOTE:  Non-HDL goal should be 30 mg/dL higher than patient's LDL goal (i.e. LDL goal of < 70 mg/dL, would have non-HDL goal of < 100 mg/dL)   Glucose Fasting, POC 05/13/2022 46 (A)  70 - 99 mg/dL Final   Glucose Fasting, POC 05/13/2022 71  70 - 99 mg/dL Final   POC Glucose 95/28/4132 91  70 - 99 mg/dl Final    RADIOGRAPHIC STUDIES: I have personally reviewed the radiological images as listed and agree with the findings in the report  No results found.  ASSESSMENT/PLAN  47 y.o. male is here because of  VTE.  Medical history notable for coronary artery disease, diabetes mellitus type 2, hyperlipidemia, hypertension, neurosyphilis, tobacco use, pancreatitis, HIV  Proximal LLE DVT  May 10 2022- Age indeterminate LLE DVT extending into iliac vein found by U/S.  Gertie Baron on Eliquis  March 2024- U/S shows non occlusive DVT left distal external iliac vein.  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  Balloon angioplasty of Left common iliac, external iliac veins, left common femoral vein and left femoral vein.  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  Can consider PCR for bcr-abl, JAK 2 with reflex, flow for PNH, FVIII level.  Because of anticoagulation with a direct thrombin inhibitor can not obtain Protein C, Protein S, ATIII activities or Lupus Anticoagulant as those are interfered with by these agents.  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  Chronic pressure from the overriding artery compresses the vein against the bony structures, usually the lower lumbar vertebrae, leading to the formation of 'venous spurs' which promote clot formation.    Rarely 'right' sided MTS has also been reported Estimated to cause 2% to 5% of all DVT.  Retrospective cadaveric and radiographic studies estimate the prevalence to be 14% to 32%.  Incidence in women is two times higher than men.  Men have a higher degree of pain and swelling in the left leg.  Women tend  to be younger and are more likely to present with a pulmonary embolism in addition to lower extremity DVT Treatment of acute thrombosis should consist of catheter-directed thrombolysis, after which the endovascular stent is placed.  Surgical resection is reserved for those who do not benefit from endovascular procedures.  Anticoagulation is an important part of the management of patients with MTS who present with iliofemoral DVT. Post-thrombotic syndrome is a common complication of PTS, which adds to the morbidity of the patients.   Cancer Staging  No matching staging information was found for the patient.   No problem-specific Assessment & Plan notes found for this encounter.    No orders of the defined types were placed in this encounter.     minutes was spent in patient care.  This included time spent preparing to see the patient (e.g., review of tests), obtaining and/or reviewing separately obtained history,  counseling and educating the patient/family/caregiver, ordering medications, tests, or procedures; documenting clinical information in the electronic or other health record, independently interpreting results and communicating results to the patient/family/caregiver as well as coordination of care.       All questions were answered. The patient knows to call the clinic with any problems, questions or concerns.  This note was electronically signed.    Loni Muse, MD  06/04/2022 1:03 PM

## 2022-06-05 LAB — FACTOR 8 ASSAY: Coagulation Factor VIII: 172 % — ABNORMAL HIGH (ref 56–140)

## 2022-06-06 LAB — CARDIOLIPIN ANTIBODIES, IGM+IGG
Anticardiolipin IgG: <9 GPL U/mL (ref 0–14)
Anticardiolipin IgM: <9 MPL U/mL (ref 0–12)

## 2022-06-07 LAB — BETA-2-GLYCOPROTEIN I ABS, IGG/M/A
Beta-2 Glyco I IgG: 10 GPI IgG units (ref 0–20)
Beta-2-Glycoprotein I IgA: >150 GPI IgA units — ABNORMAL HIGH (ref 0–25)
Beta-2-Glycoprotein I IgM: <9 GPI IgM units (ref 0–32)

## 2022-06-08 ENCOUNTER — Telehealth: Payer: Self-pay | Admitting: Oncology

## 2022-06-08 NOTE — Telephone Encounter (Signed)
Scheduled per 03/29 los, patient has been called and voicemail was left regarding upcoming appointments.

## 2022-06-09 ENCOUNTER — Other Ambulatory Visit: Payer: Self-pay | Admitting: Internal Medicine

## 2022-06-09 DIAGNOSIS — E785 Hyperlipidemia, unspecified: Secondary | ICD-10-CM

## 2022-06-17 ENCOUNTER — Telehealth: Payer: Self-pay | Admitting: Family Medicine

## 2022-06-17 ENCOUNTER — Encounter: Payer: Commercial Managed Care - HMO | Admitting: Physician Assistant

## 2022-06-17 ENCOUNTER — Other Ambulatory Visit: Payer: Commercial Managed Care - HMO

## 2022-06-17 LAB — PROTHROMBIN GENE MUTATION

## 2022-06-17 NOTE — Telephone Encounter (Signed)
Pt is calling and does have stool kit and would like cma to walk him through process of obtain stool

## 2022-06-17 NOTE — Progress Notes (Signed)
Patient had been advised to seek in person advice with PCP for issue. He responded thank you but did not cancel appt. Provider did await 10 min and sent a link in case appt was still needed. Patient made no attempt to log in. Will mark erroneous.   This encounter was created in error - please disregard.

## 2022-06-17 NOTE — Telephone Encounter (Signed)
Patient stated he was not given info as he was "frazzled" when the kit was given here at the lab.  I googled information for a stool kit for ova and parasite testing and attempted to review this with the patient.  Patient's speech was slurred, he began to talk about other things not related to the stool kit and I asked if he was OK and if he needed me to call someone to help him and the patient declined.  Patient stated he is aware of how to do the test.

## 2022-06-21 LAB — FACTOR 5 LEIDEN

## 2022-06-22 ENCOUNTER — Other Ambulatory Visit: Payer: Self-pay | Admitting: *Deleted

## 2022-06-22 DIAGNOSIS — I82422 Acute embolism and thrombosis of left iliac vein: Secondary | ICD-10-CM

## 2022-06-23 DIAGNOSIS — Z5181 Encounter for therapeutic drug level monitoring: Secondary | ICD-10-CM | POA: Insufficient documentation

## 2022-06-23 DIAGNOSIS — D6859 Other primary thrombophilia: Secondary | ICD-10-CM | POA: Insufficient documentation

## 2022-06-23 DIAGNOSIS — I871 Compression of vein: Secondary | ICD-10-CM | POA: Insufficient documentation

## 2022-06-29 ENCOUNTER — Ambulatory Visit: Payer: Commercial Managed Care - HMO | Admitting: Internal Medicine

## 2022-06-30 ENCOUNTER — Encounter: Payer: Commercial Managed Care - HMO | Admitting: Vascular Surgery

## 2022-06-30 ENCOUNTER — Ambulatory Visit (HOSPITAL_COMMUNITY): Admission: RE | Admit: 2022-06-30 | Payer: Commercial Managed Care - HMO | Source: Ambulatory Visit

## 2022-06-30 ENCOUNTER — Ambulatory Visit (HOSPITAL_COMMUNITY): Payer: Commercial Managed Care - HMO

## 2022-07-02 ENCOUNTER — Other Ambulatory Visit: Payer: Commercial Managed Care - HMO

## 2022-07-02 ENCOUNTER — Telehealth: Payer: Self-pay | Admitting: Oncology

## 2022-07-02 ENCOUNTER — Telehealth: Payer: Commercial Managed Care - HMO | Admitting: Oncology

## 2022-07-02 ENCOUNTER — Ambulatory Visit: Payer: Commercial Managed Care - HMO | Admitting: Oncology

## 2022-07-07 DEATH — deceased

## 2022-07-08 ENCOUNTER — Other Ambulatory Visit: Payer: Commercial Managed Care - HMO

## 2022-07-09 ENCOUNTER — Telehealth: Payer: Commercial Managed Care - HMO | Admitting: Oncology

## 2022-08-06 ENCOUNTER — Ambulatory Visit: Payer: Commercial Managed Care - HMO | Admitting: Neurology

## 2022-08-24 ENCOUNTER — Telehealth: Payer: Self-pay | Admitting: Family Medicine

## 2022-08-24 NOTE — Telephone Encounter (Signed)
I can but I have no idea what he passed away from--- there is no information in the chart that tells me the cause of death-- can you call them and find out what he died of? I need this to fill out the death certificate

## 2022-08-24 NOTE — Telephone Encounter (Signed)
Left message on machine for patient's sister to give [place] a call.

## 2022-08-24 NOTE — Telephone Encounter (Signed)
Spoke with the sister Maralyn Sago).  She explained that the Mom had refused an autopsy.  The patient had been passed for 3-4 days before being discovered and he was very decomposed.  EMS told the Mom that he past from natural causes and that there was no foul play.

## 2022-08-24 NOTE — Telephone Encounter (Signed)
Chanelle from [employer] called and stated that this pt was declared deceased back in Jul 08, 2022 however they are just realizing that the Marine scientist never written a death certificate.   Chanelle generated a Certificate and assigned it to Dr. Casimiro Needle and would like Dr to sign as soon as possible.   Please advise.

## 2022-08-25 NOTE — Telephone Encounter (Signed)
Death certificate completed online.

## 2022-09-14 ENCOUNTER — Ambulatory Visit: Payer: Commercial Managed Care - HMO | Admitting: Internal Medicine
